# Patient Record
Sex: Female | Born: 1960 | Race: White | Hispanic: No | Marital: Single | Smoking: Former smoker
Health system: Southern US, Community
[De-identification: ages and names within clinical notes are randomized; demographics above are authoritative.]

## PROBLEM LIST (undated history)

## (undated) DIAGNOSIS — F1490 Cocaine use, unspecified, uncomplicated: Secondary | ICD-10-CM

## (undated) DIAGNOSIS — R51 Headache: Secondary | ICD-10-CM

## (undated) DIAGNOSIS — F32A Depression, unspecified: Secondary | ICD-10-CM

## (undated) DIAGNOSIS — J449 Chronic obstructive pulmonary disease, unspecified: Secondary | ICD-10-CM

## (undated) DIAGNOSIS — I2510 Atherosclerotic heart disease of native coronary artery without angina pectoris: Secondary | ICD-10-CM

## (undated) DIAGNOSIS — F329 Major depressive disorder, single episode, unspecified: Secondary | ICD-10-CM

## (undated) DIAGNOSIS — R519 Headache, unspecified: Secondary | ICD-10-CM

## (undated) DIAGNOSIS — I10 Essential (primary) hypertension: Secondary | ICD-10-CM

## (undated) DIAGNOSIS — I219 Acute myocardial infarction, unspecified: Secondary | ICD-10-CM

## (undated) DIAGNOSIS — I6529 Occlusion and stenosis of unspecified carotid artery: Secondary | ICD-10-CM

## (undated) DIAGNOSIS — I714 Abdominal aortic aneurysm, without rupture: Secondary | ICD-10-CM

## (undated) DIAGNOSIS — F419 Anxiety disorder, unspecified: Secondary | ICD-10-CM

## (undated) DIAGNOSIS — M1990 Unspecified osteoarthritis, unspecified site: Secondary | ICD-10-CM

## (undated) DIAGNOSIS — I639 Cerebral infarction, unspecified: Secondary | ICD-10-CM

## (undated) DIAGNOSIS — F319 Bipolar disorder, unspecified: Secondary | ICD-10-CM

## (undated) DIAGNOSIS — T148XXA Other injury of unspecified body region, initial encounter: Secondary | ICD-10-CM

## (undated) HISTORY — DX: Chronic obstructive pulmonary disease, unspecified: J44.9

## (undated) HISTORY — PX: TUBAL LIGATION: SHX77

## (undated) HISTORY — PX: CYST REMOVAL TRUNK: SHX6283

## (undated) HISTORY — PX: VASCULAR SURGERY: SHX849

## (undated) HISTORY — DX: Cerebral infarction, unspecified: I63.9

## (undated) HISTORY — DX: Abdominal aortic aneurysm, without rupture: I71.4

---

## 1999-07-28 ENCOUNTER — Emergency Department (HOSPITAL_COMMUNITY): Admission: EM | Admit: 1999-07-28 | Discharge: 1999-07-28 | Payer: Self-pay | Admitting: Emergency Medicine

## 2004-08-12 ENCOUNTER — Ambulatory Visit: Payer: Self-pay | Admitting: Cardiology

## 2005-07-15 ENCOUNTER — Ambulatory Visit: Payer: Self-pay | Admitting: Cardiology

## 2005-07-16 ENCOUNTER — Inpatient Hospital Stay (HOSPITAL_COMMUNITY): Admission: AD | Admit: 2005-07-16 | Discharge: 2005-07-17 | Payer: Self-pay | Admitting: Cardiology

## 2005-07-16 ENCOUNTER — Ambulatory Visit: Payer: Self-pay | Admitting: *Deleted

## 2005-07-20 ENCOUNTER — Ambulatory Visit: Payer: Self-pay | Admitting: Cardiology

## 2005-07-22 ENCOUNTER — Inpatient Hospital Stay (HOSPITAL_COMMUNITY): Admission: RE | Admit: 2005-07-22 | Discharge: 2005-07-23 | Payer: Self-pay | Admitting: *Deleted

## 2005-07-22 ENCOUNTER — Encounter (INDEPENDENT_AMBULATORY_CARE_PROVIDER_SITE_OTHER): Payer: Self-pay | Admitting: Specialist

## 2006-11-15 ENCOUNTER — Emergency Department (HOSPITAL_COMMUNITY): Admission: EM | Admit: 2006-11-15 | Discharge: 2006-11-15 | Payer: Self-pay | Admitting: Emergency Medicine

## 2007-10-18 ENCOUNTER — Ambulatory Visit: Payer: Self-pay | Admitting: Cardiology

## 2007-11-10 ENCOUNTER — Ambulatory Visit: Payer: Self-pay | Admitting: *Deleted

## 2008-06-15 ENCOUNTER — Ambulatory Visit: Payer: Self-pay | Admitting: Cardiology

## 2008-06-19 ENCOUNTER — Ambulatory Visit: Payer: Self-pay | Admitting: Cardiovascular Disease

## 2008-06-19 ENCOUNTER — Ambulatory Visit (HOSPITAL_COMMUNITY): Admission: RE | Admit: 2008-06-19 | Discharge: 2008-06-19 | Payer: Self-pay | Admitting: Cardiovascular Disease

## 2008-09-04 ENCOUNTER — Ambulatory Visit: Payer: Self-pay | Admitting: Cardiology

## 2010-02-17 ENCOUNTER — Ambulatory Visit: Payer: Self-pay | Admitting: Cardiology

## 2011-02-10 NOTE — Procedures (Signed)
CAROTID DUPLEX EXAM   INDICATION:  Followup of known carotid artery disease.  Patient is  asymptomatic.   HISTORY:  Diabetes:  No.  Cardiac:  MI x3 between 1996-2000.  Hypertension:  Yes.  Smoking:  Yes, one pack per day.  Previous Surgery:  Right CEA.  CV History:  Questionable right hemispheric TIA in 01/09.  Amaurosis Fugax No, Paresthesias No, Hemiparesis No                                       RIGHT             LEFT  Brachial systolic pressure:         152               160  Brachial Doppler waveforms:         WNL               WNL  Vertebral direction of flow:        Antegrade  DUPLEX VELOCITIES (cm/sec)  CCA peak systolic                   78  ECA peak systolic                   141  ICA peak systolic                   116 (M/D)  ICA end diastolic                   52  PLAQUE MORPHOLOGY:                  Intimal thickening  PLAQUE AMOUNT:                      Minimal  PLAQUE LOCATION:                    Bifurcation, ICA   IMPRESSION:  1. Right internal carotid artery without recurrent stenosis, status      post carotid endarterectomy.  2. Increased velocities, likely due to compensatory flow from left      internal carotid artery occlusion.  3. Known left internal carotid artery occlusion.   ___________________________________________  P. Liliane Bade, M.D.   PB/MEDQ  D:  11/10/2007  T:  11/11/2007  Job:  [id number]

## 2011-02-10 NOTE — Cardiovascular Report (Signed)
[name], [name]              ACCOUNT NO.:  [id number]   MEDICAL RECORD NO.:  [id number]          PATIENT TYPE:  OIB   LOCATION:  2899                         FACILITY:  [facility]   PHYSICIAN:  Veverly Fells. Excell Seltzer, MD  DATE OF BIRTH:  [birth year]   DATE OF PROCEDURE:  06/19/2008  DATE OF DISCHARGE:  06/19/2008                            CARDIAC CATHETERIZATION   PROCEDURES:  1. Left heart catheterization.  2. Selective coronary angiography.  3. Left ventricular angiography.  4. Intracoronary vascular ultrasound of the left mainstem and Angio-      Seal of the right femoral artery.   INDICATIONS:  [name] is a 50 year old with known coronary and  peripheral arterial disease.  She has a documented chronic right  coronary artery occlusion.  She has developed progressive chest pain.  She also has total occlusion of an internal carotid artery and is status  post carotid endarterectomy of the contralateral artery.  She has had  increasing chest pain in the setting of cocaine use, but also with  exertion.  She was referred for cardiac catheterization for evaluation  of her concerning symptoms.   Risks and indications of procedure were reviewed with the patient.  Informed consent was obtained.  The right groin was prepped, draped, and  anesthetized with 1% lidocaine using modified Seldinger technique.  A 5-  French sheath was placed in the right femoral artery.  A standard 5-  [place] Judkins catheters were used for coronary angiography and left  ventriculography.  There was dampening of the JL-4 catheter upon entry  into the left mainstem.  I changed out for a 4-French catheter and there  was still mild ventricularization of pressure damping.  There appeared  to be mild ostial left main stenosis angiographically.  I elected to  perform IVUS to the left mainstem to rule out significant obstructive  disease involving the ostium of the left main.  The 5-French sheath was  changed out  over a 0.035-inch wire for a 6-French sheath.  The 4500  units of unfractionated heparin was given.  An ACT of 220 was obtained  and an additional 1000 units of heparin was given.  A 6-French side-hole  JL-35 guide catheter was inserted and a cougar wire was passed easily  beyond the area of concern into the mid LAD.  The IVUS catheter was  positioned in the distal left mainstem and an automated pullback was  performed.  Intracoronary nitroglycerin was given prior to insertion of  the IVUS catheter.  The IVUS catheter was removed and final angiography  was performed demonstrating no change in the patient's coronary anatomy  or coronary flow.  An Angio-Seal device was used to close the femoral  arteriotomy.  The patient tolerated the procedure well.  There were no  immediate complications.   FINDINGS:  Aortic pressure 112/58 with a mean of 81.  Left ventricular  pressure 106/12.   Left mainstem.  There is a shelf-like 40% narrowing of the ostial left  mainstem with pressure dampening as described above.  The plaque appears  nonobstructive angiographically.  The remaining portions of the  left  mainstem have very mild nonobstructive disease.  The left main  bifurcates into the LAD and left circumflex.   The LAD is a large-caliber vessel that courses down to the LV apex.  The  proximal LAD has a 30% eccentric stenosis.  The mid and distal LAD have  mild stenosis with a 30% lesion in the distal vessel and another 30%  lesion in the midvessel.  The first diagonal branch is a moderate-sized  vessel with a 50% stenosis in the proximal aspect of the vessel.   Left circumflex.  The left circumflex has minor diffuse plaque.  There  is some mild haziness in the proximal vessel with 20% stenosis present.  It supplies a large OM branch that is widely patent.  The AV groove  circumflex is small, but widely patent.  There are collaterals supplied  to the distal right coronary artery from the  left.   Right coronary artery.  There is total occlusion of the proximal right  coronary artery with extensive bridging collaterals.  The vessel fills  via antegrade flow from bridging collaterals.  The flow is TIMI III.  There is diffuse disease throughout the remaining portions of the right  coronary artery with diffuse 50-70% stenosis in the midportion and 80%  stenosis distally.  There is a PDA branch as well as a posterolateral  branch both of which are patent and appears to be small vessels.   Intravascular ultrasound of the left mainstem demonstrated a shelf-like  nonobstructive eccentric plaque.  The minimal lumen area was 7.8 square  millimeters.   ASSESSMENT:  1. Chronic right coronary artery occlusion.  2. Mild left mainstem disease, confirmed by intravascular ultrasound.  3. Nonobstructive left anterior descending and left circumflex      stenosis.   PLAN:  Recommend medical therapy.  The patient does not have focal  disease that requires revascularization.      Veverly Fells. Excell Seltzer, MD  Electronically Signed     MDC/MEDQ  D:  06/19/2008  T:  06/20/2008  Job:  [id number]   cc:   Learta Codding, MD,FACC  Kirstie Peri, MD

## 2011-02-10 NOTE — Assessment & Plan Note (Signed)
[facility]                          [facility] OFFICE NOTE   NAME:[name], [name]                       MRN:          [id number]  DATE:06/15/2008                            DOB:          1960-12-05    REFERRING PHYSICIAN:  Kirstie Peri, MD   REFERRING PHYSICIAN:  Bimal R. Sherryll Burger, MD   HISTORY OF PRESENT ILLNESS:  The patient is a 50 year old female with  known severe peripheral vascular disease as well as severe single-vessel  coronary artery disease with chronically occluded right coronary artery.  The patient saw Dr. Sherryll [name] in the office and reported chest pain over the  last several weeks.  This was rather typical angina on exertion  associated with shortness of breath and radiation into the neck area.  Dr. Sherryll [name] performed a stress test in his office.  Unfortunately, the  patient also did 2 days prior to the stress test, cocaine.  Although we  do not have an official report per [name] reading, he thought that  there was an inferior defect and referred the patient to my clinic.  This defect is compatible with her prior finding of a completely  occluded right coronary artery.  However, in talking with the patient,  it is obvious that she does have symptoms of unstable angina and has  typical exertional symptoms.  The patient is on some minimal amount of  stress and been using the Xanax quite frequently.  She feels very  anxious and depressed.  She states that she has not been on cocaine in  the last 2 days, but does continue to smoke and occasionally uses  alcohol.   ALLERGIES:  PENICILLIN.   CURRENT MEDICATIONS:  1. Enteric-coated aspirin 325 mg p.o. daily.  2. Lopressor 100 mg p.o. daily.  3. Zoloft 100 mg p.o. daily.  4. Lotrel 10/20 mg p.o. daily.  5. Xanax p.r.n.  6. Mevacor 40 mg p.o. nightly.   PHYSICAL EXAMINATION:  VITAL SIGNS:  Blood pressure is 125/88, heart  rate 76, weight 182.  NECK:  Status post right carotid  endarterectomy with well-healed scar.  There is no bruit.  There is no thyromegaly, no nodular thyroid.  JVD 6  cm.  LUNGS:  Diminished breath sounds bilaterally with few rhonchi.  HEART:  Regular rate and rhythm with normal S1 and S2.  No murmur, rubs,  or gallops.  ABDOMEN:  Soft and nontender.  No rebound or guarding.  Good bowel  sounds.  EXTREMITIES:  No cyanosis, clubbing, or edema.  NEUROLOGIC:  The patient is alert, oriented, and grossly nonfocal.   PROBLEM LIST:  1. Recurrent substernal chest pain.  2. Abnormal Cardiolite stress study consistent with prior 100%      occlusion prior of the right coronary artery.  3. Severe single-vessel coronary artery disease.      a.     Status post remote myocardial infarction, treated medically       with catheterization three times at the [facility].      b.     Preserved ejection  fraction.  4. Severe cerebrovascular disease.      a.     Right carotid endarterectomy in October 2006, stable.      b.     A 100% occlusion of the left internal carotid artery.  5. Chronic obstructive pulmonary disease, longstanding tobacco use.  6. Cocaine use.  7. Hypertension.  8. Gastroesophageal reflux disease.  9. Anxiety and depression (severe).   PLAN:  1. The patient's symptoms are consistent with worsening angina.  She      has been given p.r.n. nitroglycerin.  Although her stress test is      abnormal, which is likely explained by her chronically occluded      right coronary artery, she certainly could have progressive disease      that is not identified by the stress test.  The patient has agreed      to proceed with cardiac catheterization.  I also told her that in      the event there is no additional disease, she will need further      treatment for her chronic chest pain syndrome, which may well be      related to her anxiety and depression.  2. I do not think the patient is on a particularly good regimen for       her depression.  Likely Zoloft needs to be changed to another      selective serotonin reuptake inhibitor or an serotonin-      norepinephrine reuptake inhibitor, and also her Xanax is not      optimal treatment in this setting in conjunction.  I will ask Dr.      Sherryll [name] to review this medical regimen, particularly if the patient      does not have a clear explanation by cardiac catheterization for      her chest pain.  3. The patient has promised me that she will not use any cocaine, and      therefore I have not made any change in her medical regimen and I      left her on Lopressor.  I pointed out to her the issues with      cocaine and beta-blockers.  4. The patient will have the J-tube catheterization next week.     [name] Codding, MD,FACC  Electronically Signed    GED/MedQ  DD: 06/15/2008  DT: 06/16/2008  Job #: [id number]   cc:   [name] Sidle, MD  [name] Peri, MD

## 2011-02-13 NOTE — Op Note (Signed)
[name]              ACCOUNT NO.:  [id number]   MEDICAL RECORD NO.:  [id number]          PATIENT TYPE:  INP   LOCATION:  3306                         FACILITY:  [facility]   PHYSICIAN:  Balinda Quails, M.D.    DATE OF BIRTH:  1961-08-29   DATE OF PROCEDURE:  07/22/2005  DATE OF DISCHARGE:                                 OPERATIVE REPORT   SURGEON:  Denman George, MD   ASSISTANT:  Zadie Rhine, PA   ANESTHETIC:  General endotracheal.   PREOPERATIVE DIAGNOSIS:  Severe bilateral internal carotid artery stenoses.   POSTOPERATIVE DIAGNOSIS:  Severe bilateral internal carotid artery stenoses.   PROCEDURE:  Right carotid endarterectomy with Dacron patch angioplasty.   CLINICAL NOTE:  [name] is a 50 year old female was seen in  consultation last week by Dr. Edilia Bo with evidence of severe bilateral  internal carotid artery stenosis by Doppler evaluation.  Complete  consultation was carried out and it was recommended she undergo right  carotid endarterectomy for reduction of stroke risk.  Patient consented for  surgery.  Surgery scheduled for today electively on readmission to the  hospital.  Risks and benefits of the operative procedure were explained to  the patient in detail with a major morbidity and mortality of 1% to 2% to  include, but not limited to MI, CVA, cranial nerve injury and death.   OPERATIVE PROCEDURE:  The patient was brought to the operating room in  stable condition.  Placed in supine position.  General endotracheal  anesthesia induced.  Right neck prepped and draped in sterile fashion.  Foley catheter and arterial line in place.   Curvilinear skin incision made along the anterior border of the right  sternomastoid muscle.  Dissection carried down through subcutaneous tissue  with electrocautery.  Deep dissection carried through the platysma.  Further  dissection carried down to expose the carotid vessels.  Facial vein ligated  with 2-0  silk.  Carotid bifurcation exposed.  The common carotid artery  mobilized down to the omohyoid muscle and encircled with a Vesseloop.  The  superior thyroid and external carotid were freed and encircled with  Vesseloops.  The internal carotid artery was followed distally up to the  posterior belly of the digastric muscle and circled with a Vesseloop.   There was extensive plaque in the common carotid artery extending into the  bulb and proximal right internal carotid artery.  The patient was  administered 7000 units of heparin intravenously.  Adequate circulation time  permitted.  Carotid vessels were controlled with clamps.  A longitudinal  arteriotomy was made in the distal common carotid artery.  The arteriotomy  extended across carotid bulb and up into the internal carotid artery.  There  was a large amount of plaque in the common carotid artery.  The arteriotomy  had to be extended proximally well down into the common carotid artery.  The  shunt was then inserted.   An endarterectomy elevator was used to remove the plaque.  Extensive  endarterectomy of the common carotid artery was carried down into the base  of the neck,  where the plaque was divided transversely with Potts scissors.  Plaque then raised up into the bulb, where the superior thyroid and external  carotid were endarterectomized using an eversion technique.  The internal  carotid artery plaque then feathered out distally.  Fragments plaque were  removed with fine forceps.  The site irrigated with heparin and saline  solution.   A primary arteriotomy closure of the common carotid artery was carried out  using running 6-0 Prolene suture.  This was carried up into the proximal  bulb.  A patch angioplasty the bulb and internal carotid artery was then  carried out with a finesse Dacron patch using running 6-0 Prolene suture.  At completion of this, the shunt was removed.  All vessels were well-  flushed.  Clamps were  removed, directing the initial antegrade flow up the  external carotid artery; following this, the internal carotid was released.  There was an excellent pulse and Doppler signal in the distal internal  carotid artery.   Adequate hemostasis was obtained.  Sponges and instrument counts were  correct.  The patient was administered 50 mg of protamine intravenously.   The sternomastoid fascia was closed with running 2-0 Vicryl suture.  Platysma was closed with running 3-0 Vicryl suture.  Skin was closed with 4-  0 Monocryl.  Steri-Strips applied.   A sterile dressing was applied.  Anesthesia reversed.  The patient awakened  readily, neurologically intact.  Transferred to the recovery room in stable  condition.      Balinda Quails, M.D.  Electronically Signed     PGH/MEDQ  D:  07/22/2005  T:  07/23/2005  Job:  [id number]   cc:   Lia Hopping  Fax: [phone]   Jonita Albee [facility]

## 2011-02-13 NOTE — Discharge Summary (Signed)
NAMEABIR, EROH              ACCOUNT NO.:  [id number]   MEDICAL RECORD NO.:  [id number]          PATIENT TYPE:  INP   LOCATION:  3306                         FACILITY:  [facility]   PHYSICIAN:  Balinda Quails, M.D.    DATE OF BIRTH:  Oct 08, 1960   DATE OF ADMISSION:  07/22/2005  DATE OF DISCHARGE:  07/23/2005                                 DISCHARGE SUMMARY   PRIMARY DIAGNOSIS:  Severe bilateral internal carotid artery stenosis.   SECONDARY DIAGNOSES:  1.  Hypertension.  2.  History of depression.   IN-HOSPITAL OPERATIONS AND PROCEDURES:  Right carotid endarterectomy with  Dacron patch angioplasty.   HISTORY AND PHYSICAL/HOSPITAL COURSE:  The patient is a 50 year old female  who was seen in consultation by Dr. Edilia Bo with evidence of severe  bilateral internal carotid artery stenosis by Doppler evaluation. Complete  consultation was carried out and it was recommended that she was undergo  right internal carotid endarterectomy for __________. The patient was then  seen by Dr. Madilyn Fireman. Dr. Madilyn Fireman discussed with the patient surgery. They  discussed the risks and benefits of the procedure. The patient acknowledged  understanding and wished to proceed. Surgery was scheduled for July 22, 2005.   The patient was taken to the operating room on July 22, 2005, where she  underwent right carotid endarterectomy with Dacron patch angioplasty. The  patient tolerated the procedure well and was transferred up to the intensive  care unit in stable condition. The patient's postoperative course was pretty  much unremarkable. She remained hemodynamically stable during her  postoperative course. She was out of bed ambulating well postoperative day  one. The patient neurologically remained intact. Incisions were dry, intact,  and healing well. She was weaned off oxygen saturating greater than 90% on  room air prior to DC. She remained afebrile. Blood pressure was well  controlled. The patient  was tolerating diet well with no nausea or vomiting  noted. Foley was discontinued on postoperative day one. The patient was  voiding without difficulties prior to DC home.   The patient was discharged to home on postoperative day one, July 23, 2005, in stable condition. A follow-up appointment was scheduled for Dr.  Madilyn Fireman for August 17, 2005, at 10:40 a.m. The patient was instructed on  diet, activity level, incisional care. She was told no driving until  released to do so. No heavy lifting over 10 pounds. The patient is told she  is allowed to shower, washing her incisions using soap and water. She is to  contact the office she develops any drainage or openings from any of her  incisions site. The patient is told to ambulate three to four times a day,  progress as tolerated, and to continue her breathing exercises. She was  educated on diet to be low fat, low salt. The patient was also reminded to  quit smoking. She acknowledged her understanding.   DISCHARGE MEDICATIONS:  1.  Tylox one to two tabs p.o. q.4-6h. p.r.n. pain.  2.  Lopressor 100 mg p.o. daily.  3.  Lotrel 20 mg p.o. daily.  4.  Zoloft 100 mg p.o. daily.  5.  Xanax 0.5 mg p.o. b.i.d. p.r.n.  6.  Nitroglycerin 0.4 mg p.r.n.      Stephanie Acre Dominick, PA      P. Liliane Bade, M.D.  Electronically Signed    KMD/MEDQ  D:  09/25/2005  T:  09/26/2005  Job:  [id number]

## 2011-02-13 NOTE — H&P (Signed)
[name], KRUCZEK            ACCOUNT NO.:  [id number]   MEDICAL RECORD NO.:  [id number]          PATIENT TYPE:  INP   LOCATION:  4743                         FACILITY:  [facility]   PHYSICIAN:  Di Kindle. Edilia Bo, M.D.DATE OF BIRTH:  Jan 30, 1961   DATE OF ADMISSION:  07/16/2005  DATE OF DISCHARGE:                                HISTORY & PHYSICAL   REASON FOR CONSULTATION:  Bilateral greater than 80% carotid stenoses.   HISTORY:  This is a pleasant 50 year old woman who was admitted, yesterday,  for cardiac catheterization. She does have a history of coronary artery  disease and has had previous multiple myocardial infarctions, most recently,  at age 62. She presents with a 2-week history of substernal chest pain which  is relieved with nitroglycerin. She had been at [facility]  diagnosed with bilateral greater than 80% carotid stenoses. She did have  negative cardiac enzymes and she is to undergo this later today.   PAST MEDICAL HISTORY:  1.  Her past medical history is significant for coronary artery disease      status post MI.  2.  Hypertension.  3.  History of dyslipidemia.  4.  She denies any history of diabetes, congestive heart failure.   PAST SURGICAL HISTORY:  Significant for previous tubal ligation.   SOCIAL HISTORY:  She is single, and has four children. She smokes a half-a-  pack to 1 pack per day of cigarettes and has been smoking since she was 13.  She denies any alcohol.   FAMILY HISTORY:  Her father had coronary disease at a young age as did her  mother although she cannot remember the exact ages.   REVIEW OF SYSTEMS:  GENERAL:  She has had no weight loss, weight gain,  fever, chills.  CARDIAC:  She has had substernal chest pain and admits to some dyspnea on  exertion. She denies any palpitations.  PULMONARY:  She denies bronchitis, asthma or wheezing.  GI:  She has had no recent change of bowel habits; and has no history of  peptic  ulcer disease.  GU:  She has had no dysuria or frequency.  VASCULAR:  She denies any claudication, rest pain or nonhealing ulcers.  NEUROLOGIC:  She denies any history of seizures, headaches.   PHYSICAL EXAMINATION:  VITAL SIGNS:  Blood pressure is 120/68;  heart rate  is 72.  HEENT:  There is no cervical lymphadenopathy. She does have bilateral  carotid bruits.  LUNGS:  Clear bilaterally to auscultation.  CARDIAC EXAM:  She has a regular rate and rhythm.  ABDOMEN:  Soft and nontender. I cannot palpate an aneurysm.  EXTREMITIES:  She has palpable femoral, popliteal, dorsalis pedis, and  posterior tibial pulses bilaterally. She has mild, bilateral, lower  extremity swelling.  NEUROLOGIC EXAM:  Shows good motor, sensory deficit.   I did review the carotid duplex scan from [facility] and she  appears to have very tight bilateral ICA stenosis. Most of these are quite  high on both sides, 185 cm/sec on the right; and 233 cm/sec on the left. If  she does in fact,  have tight bilateral carotid stenoses, although she  appears to be asymptomatic; I would recommend staged bilateral carotid  endarterectomies. I will obtain a carotid duplex,  here, as I had seen the  ICAs and ECAs confused at outlying labs in the past. Assuming the artery  results confirm the [facility] results; however, I will need to consider the  timing of surgery. Given that she has had symptomatic coronary artery  disease is to undergo CABG, we would have to consider a combined procedure.  I will made further recommendations pending results of her followup duplex  and her cardiac cath.      Di Kindle. Edilia Bo, M.D.  Electronically Signed     CSD/MEDQ  D:  07/16/2005  T:  07/16/2005  Job:  [id number]

## 2011-02-13 NOTE — Discharge Summary (Signed)
[name], [name]            ACCOUNT NO.:  [id number]   MEDICAL RECORD NO.:  [id number]          PATIENT TYPE:  INP   LOCATION:  4743                         FACILITY:  [facility]   PHYSICIAN:  Learta Codding, M.D. LHCDATE OF BIRTH:  [birth year]   DATE OF ADMISSION:  07/16/2005  DATE OF DISCHARGE:  07/17/2005                                 DISCHARGE SUMMARY   CARDIOLOGIST:  Learta Codding, M.D. [facility].   CVTS:  Di Kindle. Edilia Bo, M.D.   PRIMARY CARE PHYSICIAN:  Dr. Sherryll Burger in [place], [place].   DISCHARGING DIAGNOSES:  1.  Substernal chest pain, resolved. Status post cardiac catheterization.      Occluded right coronary artery, left-to-right collateral. Ejection      fraction of 65%.  Patent bilateral subclavian.  2.  Bilateral carotid artery disease.  3.  Dyslipidemia.  4.  Tobacco abuse.  5.  Medical noncompliance.   PAST MEDICAL HISTORY:  Includes coronary artery disease, status post  myocardial infarction, hypertension, dyslipidemia, tubal ligation surgery.   HOSPITAL COURSE:  [name] is a 50 year old Caucasian female admitted to  [facility] with complaints of chest pain.  [name] has  known history of coronary artery disease, has had previous multiple  myocardial infarctions most recently at age 63.  The patient initially seen  by Arnette Felts and Dr. Myrtis Ser at [facility].  Apparently Ms.  [name] saw Dr. Diona Browner back in 2004 for a nuclear study showing an EF of  51%.  The patient presented to [facility] on this admission  with 2 weeks of substernal chest pain relieved with nitroglycerin.  Apparently [name] has had a history of 80-99% right internal carotid  stenosis and needed a CVTS consult; however, did not show up in 2004 and  2005; appointments for further evaluation. At [facility] on  July 15, 2005 patient had a bilateral carotid duplex showing large amount  of mixed density plaque  bilateral with circle lesion on right and elongated  plaque on left both consistent with 80-99% stenosis.  The patient  transferred from [facility] to [facility] for  further evaluation an CVTS consult for carotid endarterectomy.  The patient  was admitted to the catheterization lab on July 16, 2005 for unstable  angina.  The patient tolerated the procedure without complications, results  as stated above, coronary artery disease, medical management.  CVTS  consulted; Dr. Edilia Bo in to see patient on 07/17/2005.  The patient to be  arranged for outpatient right carotid endarterectomy next Wednesday then  probably proceed with left carotid endarterectomy in 4-6 weeks with the  patient being discharged home with plans to bring her back next week for a  right carotid endarterectomy.   LAB WORK:  Prior to discharge this admission, white blood cell count 9.2,  hemoglobin 11.3, hematocrit 33, platelets 432,000.  Sodium 137, potassium  4.1, chloride 107, CO2 25, glucose 99, BUN 13, with creatinine of 0.6.  Hemoglobin A1c 6.1, total cholesterol 221, triglycerides 156, HDL 41 with an  LDL of 149.  The patient's hepatic function within  normal limits except for  albumin of 3.3.  Cardiac enzymes at [facility] were negative with a troponin  of 0.03, and 0.01.  On discharge patient afebrile, blood pressure 109/67.  EKG normal sinus rhythm.   MEDICATIONS AT DISCHARGE:  1.  Aspirin 325 mg daily.  2.  Lopressor 100 mg daily.  3.  Zoloft 100 mg daily.  4.  Lotrel 10/20 mg daily.  5.  Xanax 0.5 mg p.o. b.i.d. and Mevacor 40 mg at bedtime.  6.  The patient requesting prescription for hydrocodone.  I instructed her      to followup with the doctor that prescribed this for her migraines.  7.  Also have given her a prescription for nitroglycerin p.r.n. for chest      discomfort.   She has had smoking cessation education.  She is to followup at our [facility]  office on Monday for a post  catheterization check at 12:30 and then followup  with CVTS, Dr. Edilia Bo at [phone].  CVTS will call patient for instructions  on return to hospital for carotid endarterectomy.  She is to call our office  at [phone] for any problems for her catheterization site prior to Monday.  I have also given her instructions for post catheterization.  No driving for  2 days.  Not to lift over 10 pounds x1 weeks.  She may shower, may walk up  steps, increase activity slowly.  No sexual activity for 1 week.  No tub  bathing x2 days.  She is to follow a low fat low salt, low cholesertol diet.      Dorian Pod, NP      Learta Codding, M.D. [facility]  Electronically Signed    MB/MEDQ  D:  07/17/2005  T:  07/18/2005  Job:  [id number]   cc:   Learta Codding, M.D. [facility]  [address]  [address]  [place]  [place] [address]   Di Kindle. Edilia Bo, M.D.  [address]  [place]  [place] [address]   Kirstie Peri, MD  Fax: [phone]

## 2011-02-13 NOTE — Cardiovascular Report (Signed)
[name], [name] NO.:  [id number]   MEDICAL RECORD NO.:  [id number]          PATIENT TYPE:  INP   LOCATION:  4743                         FACILITY:  [facility]   PHYSICIAN:  Vida Roller, M.D.   DATE OF BIRTH:  12-24-1960   DATE OF PROCEDURE:  07/16/2005  DATE OF DISCHARGE:                              CARDIAC CATHETERIZATION   PRIMARY CARE PHYSICIAN:  Dr. Clelia Croft in [place], [place].   CARDIOLOGIST:  Dr. Andee Lineman and Dr. Diona Browner.   HISTORY OF PRESENT ILLNESS:  [name] is a 50 year old woman, with  multiple cardiac risk factors including peripheral vascular disease, ongoing  tobacco abuse, who presents with the discomfort in her chest which is  concerning for coronary disease. She has known severe carotid disease on  carotid Dopplers and there was concern for bilateral subclavian artery  stenosis as well and she is referred for left heart catheterization and  subclavian angiography.   PROCEDURE PERFORMED:  1.  Left heart catheterization.  2.  Selective coronary angiography.  3.  Left ventriculography.  4.  Bilateral subclavian angiography.   PROCEDURE IN DETAIL:  After obtaining informed consent, the patient was  brought to the cardiac catheterization laboratory in fasting state. There  she was prepped and draped in the usual sterile manner. Local anesthetic was  obtained over the right groin using 1% lidocaine without epinephrine. The  right femoral artery was cannulated using the modified Seldinger technique  with a 6-French 10 cm sheath and left heart catheterization was performed  using a 6-French Judkins left #4, a 6-French Judkins right #4 and a 6-French  pigtail catheter. The left Judkins catheter was used for left main coronary  artery. The right Judkins catheter was used for the right coronary artery  and both subclavians pigtail catheter was used for left ventriculography in  the 30 degree RAO view. At the conclusion of the procedure, the  catheters  were removed. The patient was moved back to the cardiology holding area. The  femoral artery sheath was removed. Hemostasis was obtained using direct  manual pressure. At the conclusion of the hold, there was no evidence of  ecchymosis or hematoma formation. Distal pulses were intact. Total  fluoroscopic time was 6.8 minutes. Total ionized contrast 140 cc.   RESULTS:  Aortic pressure 160/90, mean of 122 mmHg.   Left ventricular pressure 167/16, end-diastolic pressure of 31 mmHg.   SELECTIVE CORONARY ANGIOGRAPHY:  The left main coronary artery is a moderate  caliber vessel which angiographically unremarkable.   Left anterior descending coronary artery is a large transapical vessel with  very tortuous course which has no significant obstructive disease but  luminal irregularities.   Left circumflex coronary artery is a moderate caliber vessel which has a  large obtuse marginal branch. It is without significant stenosis.   The right coronary artery is a moderate caliber dominant vessel which is  occluded in its proximal portion. There is bridging right-to-right  collaterals and left-to-right collaterals. The distal vessel was underfilled  but appears to have a 75% stenosis in the midportion. There is a posterior  descending and a posterior  lateral branch which are not well seen.   Left ventriculogram reveals preserved LV systolic function, estimated  ejection fraction 65%. No obvious wall motion abnormalities. 1+ mitral  regurgitation. Frequent ventricular ectopy.   ASSESSMENT:  1.  Single-vessel coronary disease appears to be chronically occluded and      probably not amenable to angioplasty  2.  Patent bilateral subclavian arteries.  3.  Normal left ventricular systolic function with an estimated ejection      fraction of 65%.   PLAN:  Manage her coronary disease medically and the CVTS surgeons have seen  her already in initial consult and are considering further  evaluation of her  carotid disease.      Vida Roller, M.D.  Electronically Signed     JH/MEDQ  D:  07/16/2005  T:  07/17/2005  Job:  [id number]   cc:   Clelia Croft, M.D.  [place], [place]

## 2011-05-22 DIAGNOSIS — R079 Chest pain, unspecified: Secondary | ICD-10-CM

## 2011-07-18 ENCOUNTER — Emergency Department (HOSPITAL_COMMUNITY)
Admission: EM | Admit: 2011-07-18 | Discharge: 2011-07-18 | Disposition: A | Discharge status: Home / Self Care | Payer: Self-pay | Attending: Emergency Medicine | Admitting: Emergency Medicine

## 2011-07-18 ENCOUNTER — Emergency Department (HOSPITAL_COMMUNITY): Payer: Self-pay

## 2011-07-18 ENCOUNTER — Encounter: Payer: Self-pay | Admitting: *Deleted

## 2011-07-18 DIAGNOSIS — W19XXXA Unspecified fall, initial encounter: Secondary | ICD-10-CM

## 2011-07-18 DIAGNOSIS — I10 Essential (primary) hypertension: Secondary | ICD-10-CM | POA: Insufficient documentation

## 2011-07-18 DIAGNOSIS — F17200 Nicotine dependence, unspecified, uncomplicated: Secondary | ICD-10-CM | POA: Insufficient documentation

## 2011-07-18 DIAGNOSIS — W108XXA Fall (on) (from) other stairs and steps, initial encounter: Secondary | ICD-10-CM | POA: Insufficient documentation

## 2011-07-18 DIAGNOSIS — S9030XA Contusion of unspecified foot, initial encounter: Secondary | ICD-10-CM | POA: Insufficient documentation

## 2011-07-18 HISTORY — DX: Anxiety disorder, unspecified: F41.9

## 2011-07-18 HISTORY — DX: Essential (primary) hypertension: I10

## 2011-07-18 HISTORY — DX: Bipolar disorder, unspecified: F31.9

## 2011-07-18 HISTORY — DX: Major depressive disorder, single episode, unspecified: F32.9

## 2011-07-18 HISTORY — DX: Depression, unspecified: F32.A

## 2011-07-18 MED ORDER — IBUPROFEN 400 MG PO TABS
600.0000 mg | ORAL_TABLET | Freq: Once | ORAL | Status: AC
  Administered 2011-07-18: 600 mg via ORAL

## 2011-07-18 MED ORDER — IBUPROFEN 400 MG PO TABS
ORAL_TABLET | ORAL | Status: AC
  Filled 2011-07-18: qty 2

## 2011-07-18 MED ORDER — HYDROCODONE-ACETAMINOPHEN 5-325 MG PO TABS
1.0000 | ORAL_TABLET | Freq: Once | ORAL | Status: AC
  Administered 2011-07-18: 1 via ORAL
  Filled 2011-07-18: qty 1

## 2011-07-18 MED ORDER — HYDROCODONE-ACETAMINOPHEN 5-325 MG PO TABS: 1.0000 | ORAL_TABLET | ORAL | Status: AC | PRN

## 2011-07-18 NOTE — ED Notes (Signed)
Pts left knee gave out causing her to fall down the stairs. Pt now c/o right foot pain.

## 2011-07-18 NOTE — ED Notes (Signed)
Pt left the er stating no needs 

## 2011-07-18 NOTE — ED Provider Notes (Signed)
History     CSN: [id number] Arrival date & time: 07/18/2011  3:14 AM   First MD Initiated Contact with Patient 07/18/11 0423      Chief Complaint  Patient presents with  . Foot Pain    (Consider location/radiation/quality/duration/timing/severity/associated sxs/prior treatment) HPI Comments: Seen 0423  Patient is a 50 y.o. female presenting with lower extremity pain. The history is provided by the patient.  Foot Pain This is a new (fell down stairs with foot under her.) problem. The current episode started 3 to 5 hours ago. The problem has not changed since onset.Pertinent negatives include no chest pain, no abdominal pain and no shortness of breath. The symptoms are aggravated by walking (movement, standing, palpation). The symptoms are relieved by nothing. She has tried nothing for the symptoms.    Past Medical History  Diagnosis Date  . Hypertension   . Depression   . Anxiety   . Bipolar 1 disorder     Past Surgical History  Procedure Date  . Tubal ligation   . Artery cleaned     No family history on file.  History  Substance Use Topics  . Smoking status: Current Everyday Smoker  . Smokeless tobacco: Not on file  . Alcohol Use: No    OB History    Grav Para Term Preterm Abortions TAB SAB Ect Mult Living                  Review of Systems  Respiratory: Negative for shortness of breath.   Cardiovascular: Negative for chest pain.  Gastrointestinal: Negative for abdominal pain.  Musculoskeletal:       Right foot pain  All other systems reviewed and are negative.    Allergies  Penicillins  Home Medications     BP 149/76  Pulse 82  Temp(Src) 97.9 F (36.6 C) (Oral)  Resp 20  Ht 5\' 3"  (1.6 m)  Wt 200 lb (90.719 kg)  BMI 35.43 kg/m2  SpO2 100%  LMP 07/18/2011  Physical Exam  Nursing note and vitals reviewed. Constitutional: She is oriented to person, place, and time. She appears well-developed and well-nourished.  HENT:  Head:  Normocephalic and atraumatic.  Eyes: EOM are normal.  Neck: Normal range of motion. Neck supple.  Cardiovascular: Normal rate, normal heart sounds and intact distal pulses.   Pulmonary/Chest: Effort normal.       Rales in both bases  Abdominal: Soft.  Musculoskeletal:       Crepitus to both knees. Right foot with bruising and swelling to dorsum of the foot. Pain with movement of the foot or dorsiflexion of toes. Cap refill brisk. DP and PT 2+. Painful to palpation over dorsum of foot.  Neurological: She is alert and oriented to person, place, and time.  Skin: Skin is warm and dry.    ED Course  Procedures (including critical care time)  Labs Reviewed - No data to display Dg Foot Complete Right  07/18/2011  *RADIOLOGY REPORT*  Clinical Data: Status post fall down stairs; dorsal foot pain across the base of the foot, and unable to bear weight.  RIGHT FOOT COMPLETE - 3+ VIEW  Comparison: None.  Findings: There is no evidence of fracture or dislocation.  The joint spaces are preserved.  There is no evidence of talar subluxation; the subtalar joint is unremarkable in appearance.  A small plantar calcaneal spur is incidentally noted.  No significant soft tissue abnormalities are seen.  IMPRESSION: No evidence of fracture or dislocation.  Original Report  Authenticated By: Tonia Ghent, M.D.        MDM  Patient who fell with her right foot under her. Now with bruising and swelling to dorsum of foot. Xrays with no acute findings. Symptomatic treatment initiated, analgesic, antiinflammatory, post op shoe, crutches.Pt stable in ED with no significant deterioration in condition.The patient appears reasonably screened and/or stabilized for discharge and I doubt any other medical condition or other [facility] requiring further screening, evaluation, or treatment in the ED at this time prior to discharge. MDM Reviewed: nursing note and vitals Interpretation: x-ray           Nicoletta Dress. Colon Branch,  MD 07/18/11 [phone]

## 2011-07-18 NOTE — ED Notes (Signed)
Education on crutch use completed pt return demo correct use

## 2013-10-20 ENCOUNTER — Encounter (HOSPITAL_COMMUNITY): Payer: Self-pay | Admitting: Emergency Medicine

## 2013-10-20 DIAGNOSIS — Z880 Allergy status to penicillin: Secondary | ICD-10-CM | POA: Insufficient documentation

## 2013-10-20 DIAGNOSIS — I10 Essential (primary) hypertension: Secondary | ICD-10-CM | POA: Insufficient documentation

## 2013-10-20 DIAGNOSIS — F17200 Nicotine dependence, unspecified, uncomplicated: Secondary | ICD-10-CM | POA: Insufficient documentation

## 2013-10-20 DIAGNOSIS — H538 Other visual disturbances: Secondary | ICD-10-CM | POA: Insufficient documentation

## 2013-10-20 DIAGNOSIS — F411 Generalized anxiety disorder: Secondary | ICD-10-CM | POA: Insufficient documentation

## 2013-10-20 DIAGNOSIS — J329 Chronic sinusitis, unspecified: Secondary | ICD-10-CM | POA: Insufficient documentation

## 2013-10-20 DIAGNOSIS — R0789 Other chest pain: Secondary | ICD-10-CM | POA: Insufficient documentation

## 2013-10-20 NOTE — ED Notes (Signed)
Pt. reports intermittent mid chest pain with left eye blurred vision redness and drainage onset this evening , no SOB or nausea.

## 2013-10-21 ENCOUNTER — Encounter (HOSPITAL_COMMUNITY): Payer: Self-pay | Admitting: *Deleted

## 2013-10-21 ENCOUNTER — Emergency Department (HOSPITAL_COMMUNITY): Payer: Self-pay

## 2013-10-21 ENCOUNTER — Emergency Department (HOSPITAL_COMMUNITY)
Admission: EM | Admit: 2013-10-21 | Discharge: 2013-10-21 | Disposition: A | Discharge status: Home / Self Care | Payer: Self-pay | Attending: Emergency Medicine | Admitting: Emergency Medicine

## 2013-10-21 ENCOUNTER — Other Ambulatory Visit: Payer: Self-pay

## 2013-10-21 DIAGNOSIS — F419 Anxiety disorder, unspecified: Secondary | ICD-10-CM

## 2013-10-21 DIAGNOSIS — J329 Chronic sinusitis, unspecified: Secondary | ICD-10-CM

## 2013-10-21 DIAGNOSIS — R079 Chest pain, unspecified: Secondary | ICD-10-CM

## 2013-10-21 LAB — BASIC METABOLIC PANEL
BUN: 24 mg/dL — ABNORMAL HIGH (ref 6–23)
CO2: 23 mEq/L (ref 19–32)
Calcium: 8.9 mg/dL (ref 8.4–10.5)
Chloride: 106 mEq/L (ref 96–112)
Creatinine, Ser: 0.92 mg/dL (ref 0.50–1.10)
GFR calc Af Amer: 81 mL/min — ABNORMAL LOW (ref >90)
GFR calc non Af Amer: 70 mL/min — ABNORMAL LOW (ref >90)
Glucose, Bld: 104 mg/dL — ABNORMAL HIGH (ref 70–99)
Potassium: 4.3 mEq/L (ref 3.7–5.3)
Sodium: 144 mEq/L (ref 137–147)

## 2013-10-21 LAB — CBC
HCT: 39.3 % (ref 36.0–46.0)
Hemoglobin: 12.9 g/dL (ref 12.0–15.0)
MCH: 29.1 pg (ref 26.0–34.0)
MCHC: 32.8 g/dL (ref 30.0–36.0)
MCV: 88.7 fL (ref 78.0–100.0)
Platelets: 294 10*3/uL (ref 150–400)
RBC: 4.43 MIL/uL (ref 3.87–5.11)
RDW: 14.1 % (ref 11.5–15.5)
WBC: 8.3 10*3/uL (ref 4.0–10.5)

## 2013-10-21 LAB — PRO B NATRIURETIC PEPTIDE: Pro B Natriuretic peptide (BNP): 657.3 pg/mL — ABNORMAL HIGH (ref 0–125)

## 2013-10-21 LAB — POCT I-STAT TROPONIN I: TROPONIN I, POC: 0.03 ng/mL (ref 0.00–0.08)

## 2013-10-21 MED ORDER — ALPRAZOLAM 0.5 MG PO TABS: 0.5000 mg | ORAL_TABLET | Freq: Every evening | ORAL | Status: DC | PRN

## 2013-10-21 MED ORDER — AZITHROMYCIN 250 MG PO TABS: ORAL_TABLET | ORAL | Status: DC

## 2013-10-21 MED ORDER — NITROGLYCERIN 0.4 MG SL SUBL
0.4000 mg | SUBLINGUAL_TABLET | SUBLINGUAL | Status: DC | PRN
  Administered 2013-10-21: 0.4 mg via SUBLINGUAL
  Filled 2013-10-21: qty 25

## 2013-10-21 NOTE — ED Notes (Signed)
CXR at bedside

## 2013-10-21 NOTE — ED Provider Notes (Signed)
CSN: [id number]     Arrival date & time 10/20/13  2352 History   First MD Initiated Contact with Patient 10/21/13 0033     Chief Complaint  Patient presents with  . Chest Pain   (Consider location/radiation/quality/duration/timing/severity/associated sxs/prior Treatment) HPI Patient is a 53 yo woman who states that she has has had 3 previous MIs. She has a history of PVD - s/p right CEA. She also says she has a history of of stroke.   She presents with complaints of chest pain which began around 8pm. Pain has been persistent although it has lessened somewhat. It is right sided, 6/10, pressure like and nonradiating. Pain feels similar to previous MI's although location is different. Previously, angina has been centrally located.   The patient denies any SOB, diaphoresis, light headedness.  Pain is worse with exertion - walking. Patient notes pain has also been intermittent.   She is not under the care of a cardiologist nor is she taking prescribed antihypertensives because she is uninsured. She smokes 1 to 2 ppd.   The patient notes that, shortly after her CP started, she began to experience blurred vision in left eye. This has persisted. NO ocular pain. Denies amaurosis - which is the sx she has had with several previous TIAs.   Past Medical History  Diagnosis Date  . Hypertension   . Depression   . Anxiety   . Bipolar 1 disorder    Past Surgical History  Procedure Laterality Date  . Tubal ligation    . Artery cleaned     No family history on file. History  Substance Use Topics  . Smoking status: Current Every Day Smoker  . Smokeless tobacco: Not on file  . Alcohol Use: No   OB History   Grav Para Term Preterm Abortions TAB SAB Ect Mult Living                 Review of Systems Ten point review of symptoms performed and is negative with the exception of symptoms noted above.   Allergies  Penicillins  Home Medications    BP 197/119  Pulse 94  Temp(Src) 97.9 F  (36.6 C) (Oral)  Resp 18  SpO2 99% Physical Exam Gen: well developed and well nourished appearing Head: NCAT Eyes: PERL, EOMI Nose: no epistaixis or rhinorrhea Mouth/throat: mucosa is moist and pink Neck: supple, no stridor Lungs: CTA B, no wheezing, rhonchi or rales CV: RRR, no murmur, extremities appear well perfused.  Abd: soft, notender, nondistended Back: no ttp, no cva ttp Skin: warm and dry Ext: normal to inspection, no dependent edema Neuro: CN ii-xii grossly intact, no focal deficits, normal speech, normal gait, motor strength 5/5 both arms and legs, normal finger to nose.  Psyche; normal affect,  calm and cooperative.   ED Course  Procedures (including critical care time) Labs Review EKG: nsr, no acute ischemic changes, normal intervals, normal axis, normal qrs complexImaging   Results for orders placed during the hospital encounter of 10/21/13 (from the past 24 hour(s))  CBC     Status: None   Collection Time    10/21/13  1:20 AM      Result Value Range   WBC 8.3  4.0 - 10.5 K/uL   RBC 4.43  3.87 - 5.11 MIL/uL   Hemoglobin 12.9  12.0 - 15.0 g/dL   HCT 40.939.3  81.136.0 - 91.446.0 %   MCV 88.7  78.0 - 100.0 fL   MCH 29.1  26.0 - 34.0  pg   MCHC 32.8  30.0 - 36.0 g/dL   RDW 16.1  09.6 - 04.5 %   Platelets 294  150 - 400 K/uL  BASIC METABOLIC PANEL     Status: Abnormal   Collection Time    10/21/13  1:20 AM      Result Value Range   Sodium 144  137 - 147 mEq/L   Potassium 4.3  3.7 - 5.3 mEq/L   Chloride 106  96 - 112 mEq/L   CO2 23  19 - 32 mEq/L   Glucose, Bld 104 (*) 70 - 99 mg/dL   BUN 24 (*) 6 - 23 mg/dL   Creatinine, Ser 4.09  0.50 - 1.10 mg/dL   Calcium 8.9  8.4 - 81.1 mg/dL   GFR calc non Af Amer 70 (*) >90 mL/min   GFR calc Af Amer 81 (*) >90 mL/min  PRO B NATRIURETIC PEPTIDE     Status: Abnormal   Collection Time    10/21/13  1:20 AM      Result Value Range   Pro B Natriuretic peptide (BNP) 657.3 (*) 0 - 125 pg/mL  POCT I-STAT TROPONIN I     Status: None    Collection Time    10/21/13  1:31 AM      Result Value Range   Troponin i, poc 0.03  0.00 - 0.08 ng/mL   Comment 3                CT Head Wo Contrast (Final result)  Result time: 10/21/13 01:58:41    Final result by Rad Results In Interface (10/21/13 01:58:41)    Narrative:   CLINICAL DATA: Blurred vision and erythema at the left eye, with drainage. Intermittent mid chest pain.  EXAM: CT HEAD WITHOUT CONTRAST  TECHNIQUE: Contiguous axial images were obtained from the base of the skull through the vertex without intravenous contrast.  COMPARISON: CT of the head performed 02/15/2010  FINDINGS: There is no evidence of acute infarction, mass lesion, or intra- or extra-axial hemorrhage on CT.  Chronic encephalomalacia is noted at the left frontoparietal region, reflecting remote infarct. Scattered periventricular and subcortical white matter change likely reflects small vessel ischemic microangiopathy. A chronic lacunar infarct is seen at the left thalamus.  The posterior fossa, including the cerebellum, brainstem and fourth ventricle, is within normal limits. The third and lateral ventricles, and basal ganglia are unremarkable in appearance. No mass effect or midline shift is seen.  There is no evidence of fracture; visualized osseous structures are unremarkable in appearance. The visualized portions of the orbits are within normal limits. There is mild partial opacification of the maxillary sinuses, with a mucus retention cyst or polyp at the left maxillary sinus. There is opacification of the right ethmoid air cells and partial opacification of the right frontal sinus. Paranasal sinuses and mastoid air cells are well-aerated. No significant soft tissue abnormalities are seen.  IMPRESSION: 1. No acute intracranial pathology seen on CT. 2. Left orbit is grossly unremarkable in appearance. 3. Chronic encephalomalacia at the left frontoparietal region, reflecting  remote infarct. Scattered small vessel ischemic microangiopathy. Chronic lacunar infarct at the left thalamus. 4. Mild partial opacification of the maxillary sinuses, with a mucus retention cyst or polyp at the left maxillary sinus. Partial opacification of the right frontal sinus, and opacification of the right ethmoid air cells.   Electronically Signed By: Roanna Raider M.D. On: 10/21/2013 01:58  DG Chest Portable 1 View (Final result)  Result time: 10/21/13 01:18:16    Final result by Rad Results In Interface (10/21/13 01:18:16)    Narrative:   CLINICAL DATA: Chest pain and shortness of breath. History of smoking.  EXAM: PORTABLE CHEST - 1 VIEW  COMPARISON: Chest radiograph performed 07/30/2011  FINDINGS: The lungs are well-aerated and clear. There is no evidence of focal opacification, pleural effusion or pneumothorax.  The cardiomediastinal silhouette is within normal limits. No acute osseous abnormalities are seen.  IMPRESSION: No acute cardiopulmonary process seen.   Electronically Signed By: Roanna Raider M.D. On: 10/21/2013 01:18        MDM  Patient with reassuring ED work up - NAICP on head CT. EKG is wnl and troponin is wnl. Patient is now pain free and BP has improved. I have recommended admission for full rule out and Cardiology consultation. The patient refuses noting that her mother is in the ICU her at [facility] and that decision was just made to withdraw care. So, she will elect to leave AMA. I have described risks - including permanent disability and death of failure to dx and adequately treat ACS.  Patient acknowledges and agrees to f/u with her PCP on Monday. We will refer to Cardiology. Patient agrees to return to ED for recurrent chest pain. Conversation witnessed by Alliance Cellar, BSN.   [phone]:  Patient requests prescription for something for anxiety.   Brandt Loosen, MD 10/21/13 [phone]

## 2013-10-21 NOTE — Discharge Instructions (Signed)
Chest Pain (Nonspecific) °It is often hard to give a specific diagnosis for the cause of chest pain. There is always a chance that your pain could be related to something serious, such as a heart attack or a blood clot in the lungs. You need to follow up with your caregiver for further evaluation. °CAUSES  °· Heartburn. °· Pneumonia or bronchitis. °· Anxiety or stress. °· Inflammation around your heart (pericarditis) or lung (pleuritis or pleurisy). °· A blood clot in the lung. °· A collapsed lung (pneumothorax). It can develop suddenly on its own (spontaneous pneumothorax) or from injury (trauma) to the chest. °· Shingles infection (herpes zoster virus). °The chest wall is composed of bones, muscles, and cartilage. Any of these can be the source of the pain. °· The bones can be bruised by injury. °· The muscles or cartilage can be strained by coughing or overwork. °· The cartilage can be affected by inflammation and become sore (costochondritis). °DIAGNOSIS  °Lab tests or other studies, such as X-rays, electrocardiography, stress testing, or cardiac imaging, may be needed to find the cause of your pain.  °TREATMENT  °· Treatment depends on what may be causing your chest pain. Treatment may include: °· Acid blockers for heartburn. °· Anti-inflammatory medicine. °· Pain medicine for inflammatory conditions. °· Antibiotics if an infection is present. °· You may be advised to change lifestyle habits. This includes stopping smoking and avoiding alcohol, caffeine, and chocolate. °· You may be advised to keep your head raised (elevated) when sleeping. This reduces the chance of acid going backward from your stomach into your esophagus. °· Most of the time, nonspecific chest pain will improve within 2 to 3 days with rest and mild pain medicine. °HOME CARE INSTRUCTIONS  °· If antibiotics were prescribed, take your antibiotics as directed. Finish them even if you start to feel better. °· For the next few days, avoid physical  activities that bring on chest pain. Continue physical activities as directed. °· Do not smoke. °· Avoid drinking alcohol. °· Only take over-the-counter or prescription medicine for pain, discomfort, or fever as directed by your caregiver. °· Follow your caregiver's suggestions for further testing if your chest pain does not go away. °· Keep any follow-up appointments you made. If you do not go to an appointment, you could develop lasting (chronic) problems with pain. If there is any problem keeping an appointment, you must call to reschedule. °SEEK MEDICAL CARE IF:  °· You think you are having problems from the medicine you are taking. Read your medicine instructions carefully. °· Your chest pain does not go away, even after treatment. °· You develop a rash with blisters on your chest. °SEEK IMMEDIATE MEDICAL CARE IF:  °· You have increased chest pain or pain that spreads to your arm, neck, jaw, back, or abdomen. °· You develop shortness of breath, an increasing cough, or you are coughing up blood. °· You have severe back or abdominal pain, feel nauseous, or vomit. °· You develop severe weakness, fainting, or chills. °· You have a fever. °THIS IS AN EMERGENCY. Do not wait to see if the pain will go away. Get medical help at once. Call your local emergency services (911 in [place] Do not drive yourself to the hospital. °MAKE SURE YOU:  °· Understand these instructions. °· Will watch your condition. °· Will get help right away if you are not doing well or get worse. °Document Released: 06/24/2005 Document Revised: 12/07/2011 Document Reviewed: 04/19/2008 °Exit[employer]® Patient Information ©2014 [employer]   [employer]. ° °

## 2013-10-21 NOTE — ED Notes (Signed)
Dr Lavella LemonsManly had conversation discussing plan of care with pt.  Pt stated that she understood the plan that Dr. Lavella LemonsManly described, but would be leaving against medical advice.

## 2013-10-21 NOTE — ED Notes (Addendum)
Pt reports significant stress in her life the past couple weeks has been fighting a cold and her mother had stroke this week and is in the hospital. Pt is tearful at times. Hx strokes and MI's. 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

## 2015-09-29 DIAGNOSIS — I639 Cerebral infarction, unspecified: Secondary | ICD-10-CM

## 2015-09-29 HISTORY — DX: Cerebral infarction, unspecified: I63.9

## 2016-08-06 ENCOUNTER — Inpatient Hospital Stay (HOSPITAL_COMMUNITY)
Admission: AD | Admit: 2016-08-06 | Discharge: 2016-08-10 | DRG: 287 | Disposition: A | Discharge status: Home / Self Care | Payer: Self-pay | Source: Other Acute Inpatient Hospital | Attending: Cardiovascular Disease | Admitting: Cardiovascular Disease

## 2016-08-06 ENCOUNTER — Encounter (HOSPITAL_COMMUNITY): Payer: Self-pay | Admitting: Cardiology

## 2016-08-06 DIAGNOSIS — T148XXA Other injury of unspecified body region, initial encounter: Secondary | ICD-10-CM | POA: Diagnosis present

## 2016-08-06 DIAGNOSIS — F1410 Cocaine abuse, uncomplicated: Secondary | ICD-10-CM | POA: Diagnosis present

## 2016-08-06 DIAGNOSIS — R52 Pain, unspecified: Secondary | ICD-10-CM

## 2016-08-06 DIAGNOSIS — F17200 Nicotine dependence, unspecified, uncomplicated: Secondary | ICD-10-CM | POA: Diagnosis present

## 2016-08-06 DIAGNOSIS — Z720 Tobacco use: Secondary | ICD-10-CM | POA: Diagnosis present

## 2016-08-06 DIAGNOSIS — F419 Anxiety disorder, unspecified: Secondary | ICD-10-CM | POA: Diagnosis present

## 2016-08-06 DIAGNOSIS — I2510 Atherosclerotic heart disease of native coronary artery without angina pectoris: Secondary | ICD-10-CM | POA: Insufficient documentation

## 2016-08-06 DIAGNOSIS — I10 Essential (primary) hypertension: Secondary | ICD-10-CM | POA: Diagnosis present

## 2016-08-06 DIAGNOSIS — I200 Unstable angina: Secondary | ICD-10-CM

## 2016-08-06 DIAGNOSIS — F1490 Cocaine use, unspecified, uncomplicated: Secondary | ICD-10-CM | POA: Diagnosis present

## 2016-08-06 DIAGNOSIS — Z9851 Tubal ligation status: Secondary | ICD-10-CM

## 2016-08-06 DIAGNOSIS — R58 Hemorrhage, not elsewhere classified: Secondary | ICD-10-CM

## 2016-08-06 DIAGNOSIS — F319 Bipolar disorder, unspecified: Secondary | ICD-10-CM | POA: Diagnosis present

## 2016-08-06 DIAGNOSIS — E785 Hyperlipidemia, unspecified: Secondary | ICD-10-CM

## 2016-08-06 DIAGNOSIS — I97630 Postprocedural hematoma of a circulatory system organ or structure following a cardiac catheterization: Secondary | ICD-10-CM | POA: Diagnosis not present

## 2016-08-06 DIAGNOSIS — Y848 Other medical procedures as the cause of abnormal reaction of the patient, or of later complication, without mention of misadventure at the time of the procedure: Secondary | ICD-10-CM | POA: Diagnosis not present

## 2016-08-06 DIAGNOSIS — I714 Abdominal aortic aneurysm, without rupture: Secondary | ICD-10-CM | POA: Diagnosis present

## 2016-08-06 DIAGNOSIS — D62 Acute posthemorrhagic anemia: Secondary | ICD-10-CM

## 2016-08-06 DIAGNOSIS — I25110 Atherosclerotic heart disease of native coronary artery with unstable angina pectoris: Principal | ICD-10-CM | POA: Diagnosis present

## 2016-08-06 DIAGNOSIS — Z8249 Family history of ischemic heart disease and other diseases of the circulatory system: Secondary | ICD-10-CM

## 2016-08-06 HISTORY — DX: Other injury of unspecified body region, initial encounter: T14.8XXA

## 2016-08-06 HISTORY — DX: Atherosclerotic heart disease of native coronary artery without angina pectoris: I25.10

## 2016-08-06 HISTORY — DX: Cocaine use, unspecified, uncomplicated: F14.90

## 2016-08-06 LAB — COMPREHENSIVE METABOLIC PANEL
ALK PHOS: 92 U/L (ref 38–126)
ALT: 15 U/L (ref 14–54)
AST: 21 U/L (ref 15–41)
Albumin: 3.4 g/dL — ABNORMAL LOW (ref 3.5–5.0)
Anion gap: 9 (ref 5–15)
BUN: 15 mg/dL (ref 6–20)
CALCIUM: 9.1 mg/dL (ref 8.9–10.3)
CO2: 23 mmol/L (ref 22–32)
CREATININE: 0.73 mg/dL (ref 0.44–1.00)
Chloride: 106 mmol/L (ref 101–111)
Glucose, Bld: 104 mg/dL — ABNORMAL HIGH (ref 65–99)
Potassium: 4.2 mmol/L (ref 3.5–5.1)
Sodium: 138 mmol/L (ref 135–145)
Total Bilirubin: 0.7 mg/dL (ref 0.3–1.2)
Total Protein: 6.5 g/dL (ref 6.5–8.1)

## 2016-08-06 LAB — GLUCOSE, CAPILLARY
GLUCOSE-CAPILLARY: 106 mg/dL — AB (ref 65–99)
GLUCOSE-CAPILLARY: 132 mg/dL — AB (ref 65–99)

## 2016-08-06 LAB — MRSA PCR SCREENING: MRSA by PCR: NEGATIVE

## 2016-08-06 LAB — TROPONIN I: Troponin I: <0.03 ng/mL (ref <0.03)

## 2016-08-06 LAB — HEPARIN LEVEL (UNFRACTIONATED): Heparin Unfractionated: 0.23 IU/mL — ABNORMAL LOW (ref 0.30–0.70)

## 2016-08-06 LAB — TSH: TSH: 0.49 u[IU]/mL (ref 0.350–4.500)

## 2016-08-06 MED ORDER — ONDANSETRON HCL 4 MG/2ML IJ SOLN: 4.0000 mg | Freq: Four times a day (QID) | INTRAMUSCULAR | Status: DC | PRN

## 2016-08-06 MED ORDER — LISINOPRIL 5 MG PO TABS
5.0000 mg | ORAL_TABLET | Freq: Every day | ORAL | Status: DC
  Administered 2016-08-06 – 2016-08-10 (×5): 5 mg via ORAL
  Filled 2016-08-06 (×5): qty 1

## 2016-08-06 MED ORDER — SODIUM CHLORIDE 0.9% FLUSH
3.0000 mL | Freq: Two times a day (BID) | INTRAVENOUS | Status: DC
  Administered 2016-08-06 – 2016-08-07 (×2): 3 mL via INTRAVENOUS

## 2016-08-06 MED ORDER — SODIUM CHLORIDE 0.9% FLUSH: 3.0000 mL | INTRAVENOUS | Status: DC | PRN

## 2016-08-06 MED ORDER — ALPRAZOLAM 0.25 MG PO TABS
0.2500 mg | ORAL_TABLET | Freq: Three times a day (TID) | ORAL | Status: DC | PRN
  Administered 2016-08-06 – 2016-08-09 (×8): 0.25 mg via ORAL
  Filled 2016-08-06 (×8): qty 1

## 2016-08-06 MED ORDER — SODIUM CHLORIDE 0.9 % IV SOLN
INTRAVENOUS | Status: DC
  Administered 2016-08-07: 05:00:00 via INTRAVENOUS

## 2016-08-06 MED ORDER — HEPARIN BOLUS VIA INFUSION
4000.0000 [IU] | Freq: Once | INTRAVENOUS | Status: AC
  Administered 2016-08-06: 4000 [IU] via INTRAVENOUS
  Filled 2016-08-06: qty 4000

## 2016-08-06 MED ORDER — ASPIRIN 81 MG PO CHEW
81.0000 mg | CHEWABLE_TABLET | ORAL | Status: AC
  Administered 2016-08-07: 81 mg via ORAL
  Filled 2016-08-06: qty 1

## 2016-08-06 MED ORDER — ASPIRIN EC 81 MG PO TBEC: 81.0000 mg | DELAYED_RELEASE_TABLET | Freq: Every day | ORAL | Status: DC

## 2016-08-06 MED ORDER — INSULIN ASPART 100 UNIT/ML ~~LOC~~ SOLN
0.0000 [IU] | Freq: Three times a day (TID) | SUBCUTANEOUS | Status: DC
  Administered 2016-08-08 (×2): 2 [IU] via SUBCUTANEOUS

## 2016-08-06 MED ORDER — SODIUM CHLORIDE 0.9 % IV SOLN: 250.0000 mL | INTRAVENOUS | Status: DC | PRN

## 2016-08-06 MED ORDER — CARVEDILOL 3.125 MG PO TABS
3.1250 mg | ORAL_TABLET | Freq: Two times a day (BID) | ORAL | Status: DC
  Administered 2016-08-06 – 2016-08-10 (×8): 3.125 mg via ORAL
  Filled 2016-08-06 (×8): qty 1

## 2016-08-06 MED ORDER — HEPARIN (PORCINE) IN NACL 100-0.45 UNIT/ML-% IJ SOLN
1150.0000 [IU]/h | INTRAMUSCULAR | Status: DC
  Administered 2016-08-06: 900 [IU]/h via INTRAVENOUS
  Filled 2016-08-06: qty 250

## 2016-08-06 MED ORDER — ATORVASTATIN CALCIUM 80 MG PO TABS
80.0000 mg | ORAL_TABLET | Freq: Every day | ORAL | Status: DC
  Administered 2016-08-06 – 2016-08-07 (×2): 80 mg via ORAL
  Filled 2016-08-06 (×3): qty 1

## 2016-08-06 MED ORDER — NITROGLYCERIN 0.4 MG SL SUBL: 0.4000 mg | SUBLINGUAL_TABLET | SUBLINGUAL | Status: DC | PRN

## 2016-08-06 MED ORDER — ACETAMINOPHEN 325 MG PO TABS: 650.0000 mg | ORAL_TABLET | ORAL | Status: DC | PRN

## 2016-08-06 MED ORDER — MORPHINE SULFATE (PF) 2 MG/ML IV SOLN
2.0000 mg | Freq: Once | INTRAVENOUS | Status: AC
  Administered 2016-08-06: 2 mg via INTRAVENOUS
  Filled 2016-08-06: qty 1

## 2016-08-06 NOTE — Progress Notes (Signed)
ANTICOAGULATION CONSULT NOTE - Initial Consult  Pharmacy Consult for heparin Indication: chest pain/ACS  Allergies  Allergen Reactions  . Penicillins Swelling and Other (See Comments)    Muscle rigidness    Patient Measurements: Height: 5\' 2"  (157.5 cm) Weight: 190 lb (86.2 kg) IBW/kg (Calculated) : 50.1 Heparin Dosing Weight: 69.7 kg  Vital Signs: Temp: 98.4 F (36.9 C) (11/09 1327) Temp Source: Oral (11/09 1327) BP: 164/110 (11/09 1327) Pulse Rate: 80 (11/09 1327)  Labs: No results for input(s): HGB, HCT, PLT, APTT, LABPROT, INR, HEPARINUNFRC, HEPRLOWMOCWT, CREATININE, CKTOTAL, CKMB, TROPONINI in the last 72 hours.  CrCl cannot be calculated (Patient's most recent lab result is older than the maximum 21 days allowed.).   Medical History: Past Medical History:  Diagnosis Date  . Anxiety   . Bipolar 1 disorder (HCC)   . Depression   . Hypertension     Medications:  Prescriptions Prior to Admission  Medication Sig Dispense Refill Last Dose  . ALPRAZolam (XANAX) 0.5 MG tablet Take 1 tablet (0.5 mg total) by mouth at bedtime as needed for anxiety. (Patient not taking: Reported on 08/06/2016) 12 tablet 0 Completed Course at Unknown time  . azithromycin (ZITHROMAX Z-PAK) 250 MG tablet 2 po day one, then 1 daily x 4 days (Patient not taking: Reported on 08/06/2016) 5 tablet 0 Not Taking at Unknown time   Scheduled:  . [START ON 08/07/2016] aspirin EC  81 mg Oral Daily  . atorvastatin  80 mg Oral q1800  . carvedilol  3.125 mg Oral BID WC  . insulin aspart  0-15 Units Subcutaneous TID WC  . lisinopril  5 mg Oral Daily    Assessment: DM is a 55 year old female who [facility] with a 6 day history of intermittent chest pain, associated nausea, SOB, and diaphoresis. Yesterday inpatient she developed the same chest pain associated with left arm pain and shortness of breath. ST depression in lateral leads. Patient going for a cath. Will obtain baseline labs  and start heparin. No AC at home.   [facility]- Hgb- 14.4, Hct- 43.5, Plt- 407, Scr- 0.67, INR- 1.0  [facility]  Goal of Therapy:  Heparin level 0.3-0.7 units/ml Monitor platelets by anticoagulation protocol: Yes   Plan:  Heparin 4000 unit bolus Heparin 900 units/hr Daily Heparin level/CBC 6 hour Heparin level   Hillis RangeEmily A Stewart, PharmD PGY1 Pharmacy Resident [phone]:[phone] 08/06/2016,3:19 PM

## 2016-08-06 NOTE — H&P (Signed)
Cardiology H&P    Patient ID: Merry LoftyDebra G Manthe MRN: [id number], DOB/AGE: 55/04/1961   Admit date: 08/06/2016 Date of Consult: 08/06/2016  Primary Physician: No PCP Per Patient  Primary Cardiologist: New, seen remotely by Dr. Durenda HurtMcDowel  Patient Profile    [name] is a 55 year old female with a past medical history of anxiety/depression, polysubstance abuse, and uncontrolled HTN. She [facility] on 08[facility]16 with intermittent chest pain x 6 days with associated nausea, SOB and diaphoresis. Troponin negative x 3.   History of Present Illness    [name] began having chest pain Friday night (07/31/16)while she was sitting in her home. She describes the pain as a dull pressure that radiates to her left arm. It lasted for a few minutes and she took a aspirin which relieved her pain.   She was mostly pain free over the weekend, she did use crack cocaine on Monday 08/03/16. She describes some chest pain after using the crack cocaine. Yesterday, she developed the same chest pain with associated with left arm pain and SOB. [facility] and her EKG showed NSR with lateral T wave inversion. She was markedly hypertensive with SBP's in the 240's. Troponin negative x3. Follow up EKG this am showed worsening ST depression in the lateral leads.   She is pain free at the time of my encounter. She is tearful and anxious. She tells me that she has had cardiac caths in the past, but has never had a stent. She also says that she had an Echocardiogram while incarcerated, it sounds like she has been told in the past that she has some LV dysfunction.   Past Medical History   Past Medical History:  Diagnosis Date  . Anxiety   . Bipolar 1 disorder (HCC)   . Depression   . Hypertension     Past Surgical History:  Procedure Laterality Date  . artery cleaned    . TUBAL LIGATION       Allergies  Allergies  Allergen Reactions  . Penicillins Swelling and Other (See  Comments)    Muscle rigidness    Inpatient Medications      Family History    Family History  Problem Relation Age of Onset  . Heart attack Mother   . Heart attack Father     Social History    Social History   Social History  . Marital status: Single    Spouse name: N/A  . Number of children: N/A  . Years of education: N/A   Occupational History  . Not on file.   Social History Main Topics  . Smoking status: Current Every Day Smoker  . Smokeless tobacco: Not on file  . Alcohol use No  . Drug use:     Types: Marijuana  . Sexual activity: Not on file   Other Topics Concern  . Not on file   Social History Narrative  . No narrative on file     Review of Systems    General:  No chills, fever, night sweats or weight changes.  Cardiovascular: + chest pain, dyspnea on exertion, edema, orthopnea, palpitations, paroxysmal nocturnal dyspnea. Dermatological: No rash, lesions/masses Respiratory: + cough, dyspnea Urologic: No hematuria, dysuria Abdominal:   No nausea, vomiting, diarrhea, bright red blood per rectum, melena, or hematemesis Neurologic:  No visual changes, wkns, changes in mental status. All other systems reviewed and are otherwise negative except as noted above.  Physical Exam  Blood pressure (!) 164/110, pulse 80, temperature 98.4 F (36.9 C), temperature source Oral, resp. rate 16, SpO2 98 %.  General: Pleasant, NAD Psych: Normal affect. Neuro: Alert and oriented X 3. Moves all extremities spontaneously. HEENT: Normal  Neck: Supple without bruits or JVD. Lungs:  Resp regular and unlabored, CTA. Heart: RRR no s3, s4, or murmurs. Abdomen: Soft, non-tender, non-distended, BS + x 4.  Extremities: No clubbing, cyanosis or edema. DP/PT/Radials 2+ and equal bilaterally.  Labs     WBC 10.0 Hgb 14.4 Hct 43.5 Platelets 407  Urine positive for cocaine and cannabinoids  Scr 0.67 BUN 13   Radiology Studies    No results found.  EKG &  Cardiac Imaging    EKG: NSR with lateral ST depression and T wave inversion  Echocardiogram: pending  Assessment & Plan    1. Chest pain: has had intermittent chest pain for about 6 days. She is a very difficult historian, but it sounds like last nights episode was worse episode she has had. She endorses associated symptoms of nausea, diaphoresis and SOB. I would suspect with the long duration of her pain that she would have an elevated troponin. However with her family history and many risk factors for CAD, it might be best to evaluate her coronaries by cath.   2. HTN: She was markedly hypertensive on [facility] and has not taken any medications for BP in many years. Will start her on Metoprolol 25mg  BID. Also can start an ACE-I, renal function is stable.   3. Anxiety: Patient is anxious and tearful. Alprazolam prn ordered.   4. Polysubstance abuse: Encouraged cocaine cessation.   Signed, Little IshikawaErin E Smith, NP 08/06/2016, 2:10 PM Pager: [phone]  Patient seen, examined. Available data reviewed. Agree with findings, assessment, and plan as outlined by Suzzette RighterErin Smith, NP. The patient is independently interviewed and examined. She has progressive chest discomfort over the past week. Her symptoms of intermittent. She has multiple types of chest pain and her history is difficult as she is somewhat tangential. However, the patient does exhibit some dynamic EKG changes with T-wave inversions laterally. 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 There is a loud right carotid bruit. She is at high risk of obstructive coronary artery disease. We discussed diagnostic considerations including stress testing versus cardiac catheterization. Considering the above factors I have recommended a definitive evaluation with cardiac catheterization and possible PCI. Because of concerns about her compliance, would have a  high threshold to perform PCI.  I have reviewed the risks, indications, and alternatives to cardiac catheterization, possible angioplasty, and stenting with the patient. Risks include but are not limited to bleeding, infection, vascular injury, stroke, myocardial infection, arrhythmia, kidney injury, radiation-related injury in the case of prolonged fluoroscopy use, emergency cardiac surgery, and death. The patient understands the risks of serious complication is 1-2 in 1000 with diagnostic cardiac cath and 1-2% or less with angioplasty/stenting.   Tonny BollmanMichael Retal Tonkinson, M.D. 08/06/2016 4:00 PM

## 2016-08-07 ENCOUNTER — Inpatient Hospital Stay (HOSPITAL_COMMUNITY): Payer: Self-pay

## 2016-08-07 ENCOUNTER — Encounter (HOSPITAL_COMMUNITY)
Admission: AD | Disposition: A | Discharge status: Home / Self Care | Payer: Self-pay | Source: Other Acute Inpatient Hospital | Attending: Cardiovascular Disease

## 2016-08-07 DIAGNOSIS — I6529 Occlusion and stenosis of unspecified carotid artery: Secondary | ICD-10-CM

## 2016-08-07 DIAGNOSIS — I25110 Atherosclerotic heart disease of native coronary artery with unstable angina pectoris: Principal | ICD-10-CM

## 2016-08-07 DIAGNOSIS — R079 Chest pain, unspecified: Secondary | ICD-10-CM | POA: Insufficient documentation

## 2016-08-07 HISTORY — PX: CARDIAC CATHETERIZATION: SHX172

## 2016-08-07 LAB — HEPARIN LEVEL (UNFRACTIONATED): Heparin Unfractionated: 0.12 IU/mL — ABNORMAL LOW (ref 0.30–0.70)

## 2016-08-07 LAB — CBC
HEMATOCRIT: 38 % (ref 36.0–46.0)
HEMOGLOBIN: 12.2 g/dL (ref 12.0–15.0)
MCH: 29.9 pg (ref 26.0–34.0)
MCHC: 32.1 g/dL (ref 30.0–36.0)
MCV: 93.1 fL (ref 78.0–100.0)
Platelets: 321 10*3/uL (ref 150–400)
RBC: 4.08 MIL/uL (ref 3.87–5.11)
RDW: 13.6 % (ref 11.5–15.5)
WBC: 8 10*3/uL (ref 4.0–10.5)

## 2016-08-07 LAB — BASIC METABOLIC PANEL
ANION GAP: 8 (ref 5–15)
BUN: 19 mg/dL (ref 6–20)
CALCIUM: 8.7 mg/dL — AB (ref 8.9–10.3)
CO2: 23 mmol/L (ref 22–32)
CREATININE: 0.79 mg/dL (ref 0.44–1.00)
Chloride: 107 mmol/L (ref 101–111)
Glucose, Bld: 95 mg/dL (ref 65–99)
Potassium: 4.3 mmol/L (ref 3.5–5.1)
SODIUM: 138 mmol/L (ref 135–145)

## 2016-08-07 LAB — GLUCOSE, CAPILLARY
Glucose-Capillary: 110 mg/dL — ABNORMAL HIGH (ref 65–99)
Glucose-Capillary: 118 mg/dL — ABNORMAL HIGH (ref 65–99)
Glucose-Capillary: 113 mg/dL — ABNORMAL HIGH (ref 65–99)

## 2016-08-07 LAB — PROTIME-INR
INR: 1
Prothrombin Time: 13.2 seconds (ref 11.4–15.2)

## 2016-08-07 LAB — LIPID PANEL
CHOLESTEROL: 201 mg/dL — AB (ref 0–200)
HDL: 37 mg/dL — ABNORMAL LOW (ref >40)
LDL CALC: 120 mg/dL — AB (ref 0–99)
TRIGLYCERIDES: 222 mg/dL — AB (ref <150)
Total CHOL/HDL Ratio: 5.4 RATIO
VLDL: 44 mg/dL — ABNORMAL HIGH (ref 0–40)

## 2016-08-07 LAB — HEMOGLOBIN A1C
Hgb A1c MFr Bld: 5.3 % (ref 4.8–5.6)
MEAN PLASMA GLUCOSE: 105 mg/dL

## 2016-08-07 LAB — TROPONIN I

## 2016-08-07 SURGERY — LEFT HEART CATH AND CORONARY ANGIOGRAPHY
Anesthesia: LOCAL

## 2016-08-07 MED ORDER — ONDANSETRON HCL 4 MG/2ML IJ SOLN: 4.0000 mg | Freq: Four times a day (QID) | INTRAMUSCULAR | Status: DC | PRN

## 2016-08-07 MED ORDER — MIDAZOLAM HCL 2 MG/2ML IJ SOLN
INTRAMUSCULAR | Status: DC | PRN
  Administered 2016-08-07: 2 mg via INTRAVENOUS
  Administered 2016-08-07: 1 mg via INTRAVENOUS

## 2016-08-07 MED ORDER — HEPARIN (PORCINE) IN NACL 2-0.9 UNIT/ML-% IJ SOLN
INTRAMUSCULAR | Status: DC | PRN
  Administered 2016-08-07: 1000 mL

## 2016-08-07 MED ORDER — SODIUM CHLORIDE 0.9% FLUSH
3.0000 mL | Freq: Two times a day (BID) | INTRAVENOUS | Status: DC
  Administered 2016-08-08 – 2016-08-10 (×5): 3 mL via INTRAVENOUS

## 2016-08-07 MED ORDER — DIAZEPAM 5 MG PO TABS
5.0000 mg | ORAL_TABLET | ORAL | Status: DC | PRN
  Administered 2016-08-08 – 2016-08-10 (×5): 5 mg via ORAL
  Filled 2016-08-07 (×5): qty 1

## 2016-08-07 MED ORDER — LIDOCAINE HCL (PF) 1 % IJ SOLN
INTRAMUSCULAR | Status: DC | PRN
  Administered 2016-08-07: 20 mL
  Administered 2016-08-07: 2 mL

## 2016-08-07 MED ORDER — HEPARIN BOLUS VIA INFUSION
2000.0000 [IU] | Freq: Once | INTRAVENOUS | Status: AC
  Administered 2016-08-07: 2000 [IU] via INTRAVENOUS
  Filled 2016-08-07: qty 2000

## 2016-08-07 MED ORDER — LIDOCAINE HCL (PF) 1 % IJ SOLN
INTRAMUSCULAR | Status: AC
  Filled 2016-08-07: qty 30

## 2016-08-07 MED ORDER — HEPARIN (PORCINE) IN NACL 2-0.9 UNIT/ML-% IJ SOLN
INTRAMUSCULAR | Status: AC
  Filled 2016-08-07: qty 1000

## 2016-08-07 MED ORDER — IOPAMIDOL (ISOVUE-370) INJECTION 76%
INTRAVENOUS | Status: AC
  Filled 2016-08-07: qty 100

## 2016-08-07 MED ORDER — SODIUM CHLORIDE 0.9 % IV SOLN
INTRAVENOUS | Status: DC
  Administered 2016-08-08 (×2): via INTRAVENOUS

## 2016-08-07 MED ORDER — IOPAMIDOL (ISOVUE-370) INJECTION 76%
INTRAVENOUS | Status: DC | PRN
  Administered 2016-08-07: 75 mL via INTRA_ARTERIAL

## 2016-08-07 MED ORDER — MIDAZOLAM HCL 2 MG/2ML IJ SOLN
INTRAMUSCULAR | Status: AC
  Filled 2016-08-07: qty 2

## 2016-08-07 MED ORDER — CLOPIDOGREL BISULFATE 75 MG PO TABS
75.0000 mg | ORAL_TABLET | Freq: Every day | ORAL | Status: DC
  Administered 2016-08-08: 75 mg via ORAL
  Filled 2016-08-07: qty 1

## 2016-08-07 MED ORDER — ASPIRIN 81 MG PO CHEW
81.0000 mg | CHEWABLE_TABLET | Freq: Every day | ORAL | Status: DC
  Administered 2016-08-08 – 2016-08-10 (×3): 81 mg via ORAL
  Filled 2016-08-07 (×3): qty 1

## 2016-08-07 MED ORDER — HEPARIN SODIUM (PORCINE) 1000 UNIT/ML IJ SOLN
INTRAMUSCULAR | Status: AC
  Filled 2016-08-07: qty 1

## 2016-08-07 MED ORDER — ACETAMINOPHEN 325 MG PO TABS
650.0000 mg | ORAL_TABLET | ORAL | Status: DC | PRN
  Administered 2016-08-07 – 2016-08-10 (×4): 650 mg via ORAL
  Filled 2016-08-07 (×4): qty 2

## 2016-08-07 MED ORDER — AMLODIPINE BESYLATE 5 MG PO TABS
5.0000 mg | ORAL_TABLET | Freq: Every day | ORAL | Status: DC
  Administered 2016-08-07 – 2016-08-10 (×4): 5 mg via ORAL
  Filled 2016-08-07 (×4): qty 1

## 2016-08-07 MED ORDER — FENTANYL CITRATE (PF) 100 MCG/2ML IJ SOLN
INTRAMUSCULAR | Status: DC | PRN
  Administered 2016-08-07: 50 ug via INTRAVENOUS
  Administered 2016-08-07: 25 ug via INTRAVENOUS

## 2016-08-07 MED ORDER — ATORVASTATIN CALCIUM 80 MG PO TABS
80.0000 mg | ORAL_TABLET | Freq: Every day | ORAL | Status: DC
  Administered 2016-08-08 – 2016-08-09 (×2): 80 mg via ORAL
  Filled 2016-08-07: qty 1

## 2016-08-07 MED ORDER — FENTANYL CITRATE (PF) 100 MCG/2ML IJ SOLN
INTRAMUSCULAR | Status: AC
  Filled 2016-08-07: qty 2

## 2016-08-07 MED ORDER — ENOXAPARIN SODIUM 40 MG/0.4ML ~~LOC~~ SOLN
40.0000 mg | SUBCUTANEOUS | Status: DC
  Filled 2016-08-07 (×2): qty 0.4

## 2016-08-07 MED ORDER — VERAPAMIL HCL 2.5 MG/ML IV SOLN
INTRAVENOUS | Status: AC
  Filled 2016-08-07: qty 2

## 2016-08-07 MED ORDER — SODIUM CHLORIDE 0.9% FLUSH: 3.0000 mL | INTRAVENOUS | Status: DC | PRN

## 2016-08-07 MED ORDER — SODIUM CHLORIDE 0.9 % IV SOLN: 250.0000 mL | INTRAVENOUS | Status: DC | PRN

## 2016-08-07 SURGICAL SUPPLY — 10 items
CATH INFINITI 5FR MULTPACK ANG (CATHETERS) ×1 IMPLANT
GLIDESHEATH SLEND SS 6F .021 (SHEATH) ×1 IMPLANT
GUIDEWIRE INQWIRE 1.5J.035X260 (WIRE) IMPLANT
INQWIRE 1.5J .035X260CM (WIRE) ×2
KIT HEART LEFT (KITS) ×2 IMPLANT
PACK CARDIAC CATHETERIZATION (CUSTOM PROCEDURE TRAY) ×2 IMPLANT
SHEATH PINNACLE 5F 10CM (SHEATH) ×1 IMPLANT
SYR MEDRAD MARK V 150ML (SYRINGE) ×2 IMPLANT
TRANSDUCER W/STOPCOCK (MISCELLANEOUS) ×2 IMPLANT
TUBING CIL FLEX 10 FLL-RA (TUBING) ×2 IMPLANT

## 2016-08-07 NOTE — Progress Notes (Signed)
The patient is for cardiac catheterization today.  Jeff Selvin Yun, MD    

## 2016-08-07 NOTE — H&P (View-Only) (Signed)
The patient is for cardiac catheterization today.  Jeff Jammal Sarr, MD    

## 2016-08-07 NOTE — Progress Notes (Signed)
Site area: rt groin  Site Prior to Removal:  Level 0 Pressure Applied For:  20 minutes Manual:   yes Patient Status During Pull:  stable Post Pull Site:  Level  0 Post Pull Instructions Given:  yes Post Pull Pulses Present: yes Dressing Applied:  Gauze/tegaderm Bedrest begins @  1400 Comments:

## 2016-08-07 NOTE — Progress Notes (Signed)
ANTICOAGULATION CONSULT NOTE  Pharmacy Consult for heparin Indication: chest pain/ACS  Allergies  Allergen Reactions  . Penicillins Swelling and Other (See Comments)    Muscle rigidness    Patient Measurements: Height: 5\' 2"  (157.5 cm) Weight: 190 lb (86.2 kg) IBW/kg (Calculated) : 50.1 Heparin Dosing Weight: 69.7 kg  Vital Signs: Temp: 97.9 F (36.6 C) (11/10 0334) Temp Source: Oral (11/10 0334) BP: 155/78 (11/10 0334) Pulse Rate: 76 (11/10 0334)  Labs:  Recent Labs  08/06/16 1502 08/06/16 2126 08/07/16 0255  HGB  --   --  12.2  HCT  --   --  38.0  PLT  --   --  321  LABPROT  --   --  13.2  INR  --   --  1.00  HEPARINUNFRC  --  0.23* 0.12*  CREATININE 0.73  --  0.79  TROPONINI <0.03 <0.03 <0.03    Estimated Creatinine Clearance: 80.9 mL/min (by C-G formula based on SCr of 0.79 mg/dL).  Assessment: 55 y.o. female with chest pain for heparin  Goal of Therapy:  Heparin level 0.3-0.7 units/ml Monitor platelets by anticoagulation protocol: Yes   Plan:  Heparin 2000 units IV bolus, then increase heparin  1150 units/hr Follow up after cath today   Geannie RisenGreg Estevan Kersh, PharmD, BCPS

## 2016-08-07 NOTE — Interval H&P Note (Signed)
Cath Lab Visit (complete for each Cath Lab visit)  Clinical Evaluation Leading to the Procedure:   ACS: No.  Non-ACS:    Anginal Classification: CCS IV  Anti-ischemic medical therapy: Maximal Therapy (2 or more classes of medications)  Non-Invasive Test Results: No non-invasive testing performed  Prior CABG: No previous CABG      Cath Lab Visit (complete for each Cath Lab visit)  Clinical Evaluation Leading to the Procedure:   ACS: No.  Non-ACS:    Anginal Classification: CCS IV  Anti-ischemic medical therapy: Maximal Therapy (2 or more classes of medications)  Non-Invasive Test Results: No non-invasive testing performed  Prior CABG: No previous CABG      History and Physical Interval Note:  08/07/2016 12:40 PM  [name]  has presented today for surgery, with the diagnosis of cp  The various methods of treatment have been discussed with the patient and family. After consideration of risks, benefits and other options for treatment, the patient has consented to  Procedure(s): Left Heart Cath and Coronary Angiography (N/A) as a surgical intervention .  The patient's history has been reviewed, patient examined, no change in status, stable for surgery.  I have reviewed the patient's chart and labs.  Questions were answered to the patient's satisfaction.     Nicki Guadalajarahomas Kelly

## 2016-08-07 NOTE — Progress Notes (Addendum)
*  PRELIMINARY RESULTS* Vascular Ultrasound Carotid Duplex (Doppler) has been completed.  Preliminary findings: Findings consistent with greater than 80 percent stenosis involving the right internal carotid artery.  Left common carotid artery demonstrates no flow, appears completely occluded.  Shammie, the patients nurse was informed of the findings at 11:58. Dr. Excell Seltzerooper was contacted at 12:30.  Chauncey FischerCharlotte C Shanon Seawright 08/07/2016, 12:03 PM

## 2016-08-07 NOTE — Care Management Note (Signed)
Case Management Note  Patient Details  Name: [name] MRN: [id number] Date of Birth: 12/30/1960  Subjective/Objective:  Adm w angina, pos card cath                  Action/Plan: medical treatment   Expected Discharge Date:                  Expected Discharge Plan:  Home/Self Care  In-House Referral:     Discharge planning Services  CM Consult, Indigent [facility] Clinic  Post Acute Care Choice:    Choice offered to:     DME Arranged:    DME Agency:     HH Arranged:    HH Agency:     Status of Service:  In process, will continue to follow  If discussed at Long Length of Stay Meetings, dates discussed:    Additional Comments: lives at home, no pcp. No ins listed. Gave pt inform on [facility] if she wants to get pcp. cardiol dr Diona Brownermcdowell.  Hanley Haysowell, [name], RN 08/07/2016, 3:05 PM

## 2016-08-08 ENCOUNTER — Inpatient Hospital Stay (HOSPITAL_COMMUNITY): Payer: Self-pay

## 2016-08-08 ENCOUNTER — Encounter (HOSPITAL_COMMUNITY): Payer: Self-pay | Admitting: *Deleted

## 2016-08-08 DIAGNOSIS — F1490 Cocaine use, unspecified, uncomplicated: Secondary | ICD-10-CM | POA: Diagnosis present

## 2016-08-08 DIAGNOSIS — I10 Essential (primary) hypertension: Secondary | ICD-10-CM | POA: Diagnosis present

## 2016-08-08 DIAGNOSIS — I2510 Atherosclerotic heart disease of native coronary artery without angina pectoris: Secondary | ICD-10-CM | POA: Insufficient documentation

## 2016-08-08 DIAGNOSIS — F319 Bipolar disorder, unspecified: Secondary | ICD-10-CM | POA: Diagnosis present

## 2016-08-08 DIAGNOSIS — E785 Hyperlipidemia, unspecified: Secondary | ICD-10-CM

## 2016-08-08 DIAGNOSIS — Z720 Tobacco use: Secondary | ICD-10-CM | POA: Diagnosis present

## 2016-08-08 DIAGNOSIS — D62 Acute posthemorrhagic anemia: Secondary | ICD-10-CM

## 2016-08-08 DIAGNOSIS — F419 Anxiety disorder, unspecified: Secondary | ICD-10-CM | POA: Diagnosis present

## 2016-08-08 DIAGNOSIS — T148XXA Other injury of unspecified body region, initial encounter: Secondary | ICD-10-CM | POA: Diagnosis present

## 2016-08-08 DIAGNOSIS — R079 Chest pain, unspecified: Secondary | ICD-10-CM

## 2016-08-08 LAB — BASIC METABOLIC PANEL
ANION GAP: 6 (ref 5–15)
BUN: 17 mg/dL (ref 6–20)
CHLORIDE: 110 mmol/L (ref 101–111)
CO2: 23 mmol/L (ref 22–32)
Calcium: 8.3 mg/dL — ABNORMAL LOW (ref 8.9–10.3)
Creatinine, Ser: 0.74 mg/dL (ref 0.44–1.00)
GFR calc non Af Amer: >60 mL/min (ref >60)
Glucose, Bld: 108 mg/dL — ABNORMAL HIGH (ref 65–99)
POTASSIUM: 4.3 mmol/L (ref 3.5–5.1)
SODIUM: 139 mmol/L (ref 135–145)

## 2016-08-08 LAB — CBC
HCT: 31 % — ABNORMAL LOW (ref 36.0–46.0)
HCT: 24.9 % — ABNORMAL LOW (ref 36.0–46.0)
HEMATOCRIT: 27.2 % — AB (ref 36.0–46.0)
HEMATOCRIT: 29.7 % — AB (ref 36.0–46.0)
HEMOGLOBIN: 9.9 g/dL — AB (ref 12.0–15.0)
Hemoglobin: 8.9 g/dL — ABNORMAL LOW (ref 12.0–15.0)
Hemoglobin: 7.9 g/dL — ABNORMAL LOW (ref 12.0–15.0)
Hemoglobin: 9.8 g/dL — ABNORMAL LOW (ref 12.0–15.0)
MCH: 29.8 pg (ref 26.0–34.0)
MCH: 30.4 pg (ref 26.0–34.0)
MCH: 29.6 pg (ref 26.0–34.0)
MCH: 30.8 pg (ref 26.0–34.0)
MCHC: 31.9 g/dL (ref 30.0–36.0)
MCHC: 32.7 g/dL (ref 30.0–36.0)
MCHC: 31.7 g/dL (ref 30.0–36.0)
MCHC: 33 g/dL (ref 30.0–36.0)
MCV: 93.4 fL (ref 78.0–100.0)
MCV: 92.8 fL (ref 78.0–100.0)
MCV: 93.3 fL (ref 78.0–100.0)
MCV: 93.4 fL (ref 78.0–100.0)
PLATELETS: 273 10*3/uL (ref 150–400)
PLATELETS: 268 10*3/uL (ref 150–400)
PLATELETS: 158 10*3/uL (ref 150–400)
Platelets: 277 10*3/uL (ref 150–400)
RBC: 3.32 MIL/uL — AB (ref 3.87–5.11)
RBC: 2.93 MIL/uL — AB (ref 3.87–5.11)
RBC: 2.67 MIL/uL — ABNORMAL LOW (ref 3.87–5.11)
RBC: 3.18 MIL/uL — ABNORMAL LOW (ref 3.87–5.11)
RDW: 13.4 % (ref 11.5–15.5)
RDW: 13.3 % (ref 11.5–15.5)
RDW: 13.5 % (ref 11.5–15.5)
RDW: 13.5 % (ref 11.5–15.5)
WBC: 8.1 10*3/uL (ref 4.0–10.5)
WBC: 9.2 10*3/uL (ref 4.0–10.5)
WBC: 9.3 10*3/uL (ref 4.0–10.5)
WBC: 9.5 10*3/uL (ref 4.0–10.5)

## 2016-08-08 LAB — ECHOCARDIOGRAM COMPLETE
Height: 62 in
Weight: 3040 [oz_av]

## 2016-08-08 LAB — GLUCOSE, CAPILLARY
GLUCOSE-CAPILLARY: 139 mg/dL — AB (ref 65–99)
GLUCOSE-CAPILLARY: 130 mg/dL — AB (ref 65–99)
GLUCOSE-CAPILLARY: 114 mg/dL — AB (ref 65–99)
Glucose-Capillary: 87 mg/dL (ref 65–99)

## 2016-08-08 LAB — PREPARE RBC (CROSSMATCH)

## 2016-08-08 LAB — ABO/RH: ABO/RH(D): O POS

## 2016-08-08 MED ORDER — ACETAMINOPHEN 325 MG PO TABS
650.0000 mg | ORAL_TABLET | Freq: Once | ORAL | Status: AC
  Administered 2016-08-08: 650 mg via ORAL
  Filled 2016-08-08: qty 2

## 2016-08-08 MED ORDER — MORPHINE SULFATE (PF) 2 MG/ML IV SOLN
2.0000 mg | INTRAVENOUS | Status: AC | PRN
  Administered 2016-08-08 (×2): 2 mg via INTRAVENOUS
  Filled 2016-08-08: qty 1

## 2016-08-08 MED ORDER — SODIUM CHLORIDE 0.9 % IV SOLN
Freq: Once | INTRAVENOUS | Status: AC
  Administered 2016-08-08: 17:00:00 via INTRAVENOUS

## 2016-08-08 MED ORDER — MORPHINE SULFATE (PF) 2 MG/ML IV SOLN
2.0000 mg | INTRAVENOUS | Status: AC | PRN
  Administered 2016-08-08 (×2): 2 mg via INTRAVENOUS
  Filled 2016-08-08 (×2): qty 1

## 2016-08-08 MED ORDER — MORPHINE SULFATE (PF) 2 MG/ML IV SOLN
2.0000 mg | INTRAVENOUS | Status: DC | PRN
  Administered 2016-08-08 – 2016-08-09 (×3): 2 mg via INTRAVENOUS
  Filled 2016-08-08 (×3): qty 1

## 2016-08-08 MED ORDER — TRAMADOL HCL 50 MG PO TABS
50.0000 mg | ORAL_TABLET | Freq: Four times a day (QID) | ORAL | Status: DC | PRN
  Administered 2016-08-08 – 2016-08-10 (×3): 50 mg via ORAL
  Filled 2016-08-08 (×3): qty 1

## 2016-08-08 MED ORDER — MORPHINE SULFATE (PF) 2 MG/ML IV SOLN
INTRAVENOUS | Status: AC
  Filled 2016-08-08: qty 1

## 2016-08-08 MED ORDER — DIPHENHYDRAMINE HCL 25 MG PO CAPS
25.0000 mg | ORAL_CAPSULE | Freq: Once | ORAL | Status: AC
  Administered 2016-08-08: 25 mg via ORAL
  Filled 2016-08-08: qty 1

## 2016-08-08 NOTE — Progress Notes (Signed)
Ward Givenshris Berge, NP paged to notify of R groin hematoma.

## 2016-08-08 NOTE — Progress Notes (Signed)
Pt. C/o shooting pain in right femoral cath site. MD notified. Pt taken to CT scan. MD paged with results. Continuing to monitor pt.

## 2016-08-08 NOTE — Progress Notes (Signed)
Femostop placed on R groin. Not inflated, but applying pressure d/t positioning. Ward Givenshris Berge NP ordered placement. Growth of hematoma marked. DPs 2+.

## 2016-08-08 NOTE — Progress Notes (Signed)
  Echocardiogram 2D Echocardiogram has been performed.  [name], [name] 08/08/2016, 11:54 AM

## 2016-08-08 NOTE — Progress Notes (Signed)
    S:  H/H returned slightly lower than previous (below).  Pt stable.  O:   Vitals:   08/08/16 1200 08/08/16 1400  BP: (!) 143/72 (!) 121/57  Pulse: 84 79  Resp: 11 19  Temp:     Lab Results  Component Value Date   WBC 9.3 08/08/2016   HGB 7.9 (L) 08/08/2016   HCT 24.9 (L) 08/08/2016   MCV 93.3 08/08/2016   PLT 268 08/08/2016     A/P:  1.  R groin hematoma/ABL Anemia:  H/h stabilizing, though down slightly further since blood draw this AM.  Will transfuse 1 unit as she is below 8.  F/u cbc q6h.  Pressure dsg to right groin (fem stop has been in place).  Nicolasa Duckinghristopher Joette Schmoker, NP

## 2016-08-08 NOTE — Progress Notes (Signed)
Patient Name: [name] LoftyDebra G Kubitz Date of Encounter: 08/08/2016  Primary Cardiologist: Ival BibleS. McDowell, MD   Hospital Problem List     Principal Problem:   Unstable angina pectoris [facility](HCC) Active Problems:   CAD (coronary artery disease)   Crack cocaine use   Right Groin Hematoma   Bipolar 1 disorder (HCC)   Hypertension   Anxiety   Acute blood loss anemia   Tobacco abuse   Hyperlipidemia   Subjective   No c/p overnight, however pt c/o right groin pain and was noted to have expanding hematoma.  CT abd/pelvis notable for hematoma with adjacent stranding and hemorrhage centered in the inferior right groin region and extending throughout the upper thigh and along the anterior right flank region. The hematoma extends into the right thigh below the field of view of this study. Hematoma measures about 7.5 x 14.2 cm in the transverse plane and greater than 10 cm longitudinally.    Pt with significant R groin pain this AM.  H/H down slightly - f/u labs pending.  Inpatient Medications    . amLODipine  5 mg Oral Daily  . aspirin  81 mg Oral Daily  . atorvastatin  80 mg Oral q1800  . atorvastatin  80 mg Oral q1800  . carvedilol  3.125 mg Oral BID WC  . insulin aspart  0-15 Units Subcutaneous TID WC  . lisinopril  5 mg Oral Daily  . morphine      . sodium chloride flush  3 mL Intravenous Q12H    Vital Signs    Vitals:   08/08/16 0000 08/08/16 0403 08/08/16 0630 08/08/16 0808  BP:  (!) 146/79 116/75 (!) 143/91  Pulse: 91 65 72 86  Resp: (!) 25 (!) 22 19 13   Temp:  98.1 F (36.7 C)  97.5 F (36.4 C)  TempSrc:  Oral  Oral  SpO2: 100% 95% 99% 95%  Weight:      Height:        Intake/Output Summary (Last 24 hours) at 08/08/16 [id number] Last data filed at 08/08/16 0800  Gross per 24 hour  Intake           3977.5 ml  Output             1200 ml  Net           2777.5 ml   Filed Weights   08/06/16 1400  Weight: 190 lb (86.2 kg)    Physical Exam   GEN: Well nourished, well  developed, in no acute distress.  HEENT: Grossly normal.  Neck: Supple, no JVD, carotid bruits, or masses. Cardiac: RRR, no murmurs, rubs, or gallops. No clubbing, cyanosis, edema.  Radials/DP/PT 2+ and equal bilaterally. R groin markedly ecchymotic with large hematoma extending from sheath insertion site medially toward inner thigh and pelvic area - very tender.  No bruit. Respiratory:  Respirations regular and unlabored, scattered rhonchi. GI: Soft, nontender, nondistended, BS + x 4.  No flank tenderness or bruising. MS: no deformity or atrophy. Skin: warm and dry, no rash. Neuro:  Strength and sensation are intact. Psych: AAOx3.  Normal affect.  Labs    CBC  Recent Labs  08/07/16 0255 08/08/16 0225  WBC 8.0 8.1  HGB 12.2 9.9*  HCT 38.0 31.0*  MCV 93.1 93.4  PLT 321 277   Basic Metabolic Panel  Recent Labs  08/07/16 0255 08/08/16 0225  NA 138 139  K 4.3 4.3  CL 107 110  CO2 23 23  GLUCOSE 95  108*  BUN 19 17  CREATININE 0.79 0.74  CALCIUM 8.7* 8.3*   Liver Function Tests  Recent Labs  08/06/16 1502  AST 21  ALT 15  ALKPHOS 92  BILITOT 0.7  PROT 6.5  ALBUMIN 3.4*   Cardiac Enzymes  Recent Labs  08/06/16 1502 08/06/16 2126 08/07/16 0255  TROPONINI <0.03 <0.03 <0.03   Hemoglobin A1C  Recent Labs  08/06/16 1502  HGBA1C 5.3   Fasting Lipid Panel  Recent Labs  08/07/16 0255  CHOL 201*  HDL 37*  LDLCALC 120*  TRIG 222*  CHOLHDL 5.4   Thyroid Function Tests  Recent Labs  08/06/16 1502  TSH 0.490    Telemetry    rsr  Radiology    Ct Abdomen Pelvis Wo Contrast  Result Date: 08/08/2016 CLINICAL DATA:  Retroperitoneal bleed. History of tubal ligation. Smoker. EXAM: CT ABDOMEN AND PELVIS WITHOUT CONTRAST TECHNIQUE: Multidetector CT imaging of the abdomen and pelvis was performed following the standard protocol without IV contrast. COMPARISON:  07/30/2011 FINDINGS: Lower chest: No acute abnormality. Hepatobiliary: No focal liver  abnormality is seen. No gallstones, gallbladder wall thickening, or biliary dilatation. Pancreas: Unremarkable. No pancreatic ductal dilatation or surrounding inflammatory changes. Spleen: Normal in size without focal abnormality. Adrenals/Urinary Tract: No adrenal gland nodules. Cyst on the upper pole of the left kidney. No hydronephrosis or hydroureter. Left renal atrophy. Increased density in the right renal collecting system may indicate concentration of urine or hemorrhage. Bladder wall is not thickened. No filling defects. Stomach/Bowel: Stomach is within normal limits. Appendix appears normal. No evidence of bowel wall thickening, distention, or inflammatory changes. Vascular/Lymphatic: Aortic atherosclerosis. Infrarenal abdominal aortic aneurysm measuring 4 cm in AP direction. This is enlarging from 3 cm on the previous study. No retroperitoneal lymphadenopathy. Reproductive: Uterus is mildly enlarged and somewhat lobular suggesting fibroids. Left ovarian cyst measuring 2.9 cm diameter, likely functional. No abnormal adnexal masses. Other: There is a large soft tissue hematoma with adjacent stranding and hemorrhage centered in the inferior right groin region and extending throughout the upper thigh and along the anterior right flank region. The hematoma extends into the right thigh below the field of view of this study. Hematoma measures about 7.5 x 14.2 cm in the transverse plane and greater than 10 cm longitudinally. Musculoskeletal: Degenerative changes in the spine. 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 IMPRESSION: 1. Large hematoma in the soft tissues of the right groin and anterior right thigh. 2. Developing abdominal aortic aneurysm since previous study, measuring 4 cm in AP direction. 3. Increased density in the right renal collecting system may indicate concentration of urine or hemorrhage. These results will be called to the ordering clinician or representative by the Radiologist  Assistant, and communication documented in the PACS or zVision Dashboard. Electronically Signed   By: Burman Nieves M.D.   On: 08/08/2016 06:03    Patient Profile     55 y/o ? with a h/o bipolar d/o, anxiety, HTN, HL, tob abuse, crack cocaine abuse, and obesity who presented 11/9 with chest pain and neg trop in the setting of crack cocaine abuse.  Cath revealed occluded RCA with moderate left system dzs  med rx recommended.  Post-cath 11/10, developed large right groin hematoma with drop in H/H.  Assessment & Plan    1.  Unstable Angina/CAD:  Presented with c/p in the setting of crack cocaine.  S/p cath on 11/10 revealing CTO of the RCA with moderate, diffuse LAD dzs.  Med Rx was recommended.  She  has not had recurrence of c/p.  Plavix started post-cath, however I will hold in setting of large hematoma.  Cont asa, statin,  blocker, acei.  2.  R Groin Hematoma/Acute blood loss anemia:  Post-cath pt c/o R groin pain. CT early this morning confirmed large hematoma with evidence of active bleeding.  H/H down from 12.2/38.0  9.9/31.  Hemodynamically stable.  No bruit.  Good distal pulse. F/u stat CBC and type and screen.  Manual pressure being applied now.  Pain mgmt with prn morphine for now.  With outpt elicit drug abuse, we will need to wean quickly.  Low threshold to transfuse and/or re-image for further h/h drop @ which point she would likely also require vascular surgery eval.  3.  HTN:  BP up this AM in setting of pain.  Cont current meds.  I won't titrate as I expect that she will be receiving a fair amt of pain meds over the next few hrs r/t hematoma.  4.  HL:  LDL 120.  Statin started.  5.  Tob Abuse/Crack Cocaine Abuse:  Cessation advised.  Would benefit from Social Work eval for [employer].  Signed, Nicolasa Duckinghristopher Berge NP 08/08/2016, 9:22 AM   Patient seen with NP, agree with the above note.  Femstop placed this morning.  Still with significant pain at right groin cath site.   Hemoglobin dropped about 1 g/dL over 7 hrs.  Will repeat CBC at 2 pm, hopefully will have stabilized.  If further significant drop, will consult vascular surgery.  Transfuse hgb < 8 with active bleeding.   Marca AnconaDalton McLean 08/08/2016 10:57 AM

## 2016-08-09 ENCOUNTER — Inpatient Hospital Stay (HOSPITAL_COMMUNITY): Payer: Self-pay

## 2016-08-09 DIAGNOSIS — F1490 Cocaine use, unspecified, uncomplicated: Secondary | ICD-10-CM

## 2016-08-09 LAB — VAS US CAROTID
LEFT ECA DIAS: -20 cm/s
LEFT VERTEBRAL DIAS: -13 cm/s
RCCAPDIAS: 20 cm/s
RCCAPSYS: 53 cm/s
RIGHT ECA DIAS: -22 cm/s
RIGHT VERTEBRAL DIAS: -34 cm/s
Right cca dist sys: -112 cm/s

## 2016-08-09 LAB — TYPE AND SCREEN
ABO/RH(D): O POS
ANTIBODY SCREEN: NEGATIVE
Unit division: 0

## 2016-08-09 LAB — CBC
HCT: 28.7 % — ABNORMAL LOW (ref 36.0–46.0)
Hemoglobin: 9.3 g/dL — ABNORMAL LOW (ref 12.0–15.0)
MCH: 29.7 pg (ref 26.0–34.0)
MCHC: 32.4 g/dL (ref 30.0–36.0)
MCV: 91.7 fL (ref 78.0–100.0)
PLATELETS: 226 10*3/uL (ref 150–400)
RBC: 3.13 MIL/uL — AB (ref 3.87–5.11)
RDW: 13.8 % (ref 11.5–15.5)
WBC: 9.5 10*3/uL (ref 4.0–10.5)

## 2016-08-09 LAB — BASIC METABOLIC PANEL
ANION GAP: 6 (ref 5–15)
BUN: 13 mg/dL (ref 6–20)
CALCIUM: 8.5 mg/dL — AB (ref 8.9–10.3)
CO2: 24 mmol/L (ref 22–32)
Chloride: 110 mmol/L (ref 101–111)
Creatinine, Ser: 0.72 mg/dL (ref 0.44–1.00)
GLUCOSE: 92 mg/dL (ref 65–99)
Potassium: 3.9 mmol/L (ref 3.5–5.1)
SODIUM: 140 mmol/L (ref 135–145)

## 2016-08-09 LAB — GLUCOSE, CAPILLARY
GLUCOSE-CAPILLARY: 96 mg/dL (ref 65–99)
GLUCOSE-CAPILLARY: 105 mg/dL — AB (ref 65–99)
Glucose-Capillary: 127 mg/dL — ABNORMAL HIGH (ref 65–99)

## 2016-08-09 NOTE — Progress Notes (Signed)
Patient Name: [name] Date of Encounter: 08/09/2016  Primary Cardiologist: Ival BibleS. McDowell, MD   Hospital Problem List     Principal Problem:   Unstable angina pectoris [facility](HCC) Active Problems:   CAD (coronary artery disease)   Crack cocaine use   Right Groin Hematoma   Bipolar 1 disorder (HCC)   Hypertension   Anxiety   Acute blood loss anemia   Tobacco abuse   Hyperlipidemia   Subjective   Feels better this AM.  Groin less sore and softer.  Tolerated PRBC's well yesterday.  H/H stable.  Inpatient Medications    . amLODipine  5 mg Oral Daily  . aspirin  81 mg Oral Daily  . atorvastatin  80 mg Oral q1800  . atorvastatin  80 mg Oral q1800  . carvedilol  3.125 mg Oral BID WC  . insulin aspart  0-15 Units Subcutaneous TID WC  . lisinopril  5 mg Oral Daily  . sodium chloride flush  3 mL Intravenous Q12H    Vital Signs    Vitals:   08/09/16 0342 08/09/16 0400 08/09/16 0804 08/09/16 0839  BP: (!) 157/72  109/80 109/80  Pulse:      Resp: (!) 22 17 (!) 22   Temp: 98.1 F (36.7 C)  99.3 F (37.4 C)   TempSrc: Oral  Oral   SpO2: 100%     Weight:      Height:        Intake/Output Summary (Last 24 hours) at 08/09/16 0909 Last data filed at 08/09/16 [id number]  Gross per 24 hour  Intake             2734 ml  Output             1450 ml  Net             1284 ml   Filed Weights   08/06/16 1400  Weight: 190 lb (86.2 kg)    Physical Exam   GEN: Well nourished, well developed, in no acute distress.  HEENT: Grossly normal.  Neck: Supple, no JVD, carotid bruits, or masses. Cardiac: RRR, no murmurs, rubs, or gallops. No clubbing, cyanosis, edema.  Radials/DP/PT 2+ and equal bilaterally. R groin markedly ecchymotic extending medially, distally, and posteriorly toward R flank.  Area much softer this AM, less tender.  No bruit.   Respiratory:  Respirations regular and unlabored, scattered rhonchi. GI: Soft, nontender, nondistended, BS + x 4.  No flank tenderness or  bruising. MS: no deformity or atrophy. Skin: warm and dry, no rash. Neuro:  Strength and sensation are intact. Psych: AAOx3.  Normal affect.  Labs    CBC  Recent Labs  08/08/16 2149 08/09/16 0404  WBC 9.5 9.5  HGB 9.8* 9.3*  HCT 29.7* 28.7*  MCV 93.4 91.7  PLT 158 226   Basic Metabolic Panel  Recent Labs  08/08/16 0225 08/09/16 0404  NA 139 140  K 4.3 3.9  CL 110 110  CO2 23 24  GLUCOSE 108* 92  BUN 17 13  CREATININE 0.74 0.72  CALCIUM 8.3* 8.5*   Liver Function Tests  Recent Labs  08/06/16 1502  AST 21  ALT 15  ALKPHOS 92  BILITOT 0.7  PROT 6.5  ALBUMIN 3.4*   Cardiac Enzymes  Recent Labs  08/06/16 1502 08/06/16 2126 08/07/16 0255  TROPONINI <0.03 <0.03 <0.03   Hemoglobin A1C  Recent Labs  08/06/16 1502  HGBA1C 5.3   Fasting Lipid Panel  Recent Labs  08/07/16  0255  CHOL 201*  HDL 37*  LDLCALC 120*  TRIG 222*  CHOLHDL 5.4   Thyroid Function Tests  Recent Labs  08/06/16 1502  TSH 0.490    Telemetry    rsr  Radiology    Ct Abdomen Pelvis Wo Contrast  Result Date: 08/08/2016 CLINICAL DATA:  Retroperitoneal bleed. History of tubal ligation. Smoker. EXAM: CT ABDOMEN AND PELVIS WITHOUT CONTRAST TECHNIQUE: Multidetector CT imaging of the abdomen and pelvis was performed following the standard protocol without IV contrast. COMPARISON:  07/30/2011 FINDINGS: Lower chest: No acute abnormality. Hepatobiliary: No focal liver abnormality is seen. No gallstones, gallbladder wall thickening, or biliary dilatation. Pancreas: Unremarkable. No pancreatic ductal dilatation or surrounding inflammatory changes. Spleen: Normal in size without focal abnormality. Adrenals/Urinary Tract: No adrenal gland nodules. Cyst on the upper pole of the left kidney. No hydronephrosis or hydroureter. Left renal atrophy. Increased density in the right renal collecting system may indicate concentration of urine or hemorrhage. Bladder wall is not thickened. No  filling defects. Stomach/Bowel: Stomach is within normal limits. Appendix appears normal. No evidence of bowel wall thickening, distention, or inflammatory changes. Vascular/Lymphatic: Aortic atherosclerosis. Infrarenal abdominal aortic aneurysm measuring 4 cm in AP direction. This is enlarging from 3 cm on the previous study. No retroperitoneal lymphadenopathy. Reproductive: Uterus is mildly enlarged and somewhat lobular suggesting fibroids. Left ovarian cyst measuring 2.9 cm diameter, likely functional. No abnormal adnexal masses. Other: There is a large soft tissue hematoma with adjacent stranding and hemorrhage centered in the inferior right groin region and extending throughout the upper thigh and along the anterior right flank region. The hematoma extends into the right thigh below the field of view of this study. Hematoma measures about 7.5 x 14.2 cm in the transverse plane and greater than 10 cm longitudinally. Musculoskeletal: Degenerative changes in the spine. 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 IMPRESSION: 1. Large hematoma in the soft tissues of the right groin and anterior right thigh. 2. Developing abdominal aortic aneurysm since previous study, measuring 4 cm in AP direction. 3. Increased density in the right renal collecting system may indicate concentration of urine or hemorrhage. These results will be called to the ordering clinician or representative by the Radiologist Assistant, and communication documented in the PACS or zVision Dashboard. Electronically Signed   By: Burman NievesWilliam  Stevens M.D.   On: 08/08/2016 06:03    Patient Profile     55 y/o ? with a h/o bipolar d/o, anxiety, HTN, HL, tob abuse, crack cocaine abuse, and obesity who presented 11/9 with chest pain and neg trop in the setting of crack cocaine abuse.  Cath revealed occluded RCA with moderate left system dzs  med rx recommended.  Post-cath 11/10, developed large right groin hematoma with drop in H/H.  Assessment  & Plan    1.  Unstable Angina/CAD:  Presented with c/p in the setting of crack cocaine.  S/p cath on 11/10 revealing CTO of the RCA with moderate, diffuse LAD dzs.  Med Rx was recommended.  She has not had recurrence of c/p.  Plavix started post-cath, held given hematoma and absence of ACS/stenting.  Cont asa, statin,  blocker, acei.  2.  R Groin Hematoma/Acute blood loss anemia:  Post-cath pt c/o R groin pain. CT early morning 11/11 confirmed large hematoma with evidence of active bleeding.  H/H down from 12.2/38.0  9.9/31  8.9/27/2  7.9/24.9.  Given one unit of PRBC's yesterday.  H/H 9.3/28.7 this AM.  Hemodynamically stable.  No bruit.  Good distal  pulse. Area remains very ecchymotic but much softer and less tender than 11/11.  Requiring less prn morphine.  Bedrest today.  F/u CBC in AM.  ? Repeat CT to re-eval groin (along with mention of ? Renal hemorrhage) - will d/w Dr. Shirlee Latch.  3.  HTN:  BP stable.  Cont  blocker and acei.  4.  HL:  LDL 120.  Statin started.  5.  Tob Abuse/Crack Cocaine Abuse:  Cessation advised.  Would benefit from Social Work eval for [employer]  Will need to be mindful of this when consider pain meds for groin @ d/c.  6.  Developing AAA:  Noted on CT 11/11 - 4 cm.  Will need f/u.  Signed, Nicolasa Ducking NP 08/09/2016, 9:09 AM   Patient seen with NP, agree with the above note.  Patient got a unit PRBCs yesterday, hemoglobin now appears stable.  Hematoma area softer.  I am going to repeat CT w/o contrast today to reassess groin as well as right renal collecting system (urine versus hemorrhage on CT 11/11).    If she remains stable, hopefully home in am.   Marca Ancona 08/09/2016 9:47 AM

## 2016-08-10 ENCOUNTER — Encounter (HOSPITAL_COMMUNITY): Payer: Self-pay | Admitting: Cardiovascular Disease

## 2016-08-10 DIAGNOSIS — Z720 Tobacco use: Secondary | ICD-10-CM

## 2016-08-10 DIAGNOSIS — E7800 Pure hypercholesterolemia, unspecified: Secondary | ICD-10-CM

## 2016-08-10 DIAGNOSIS — I10 Essential (primary) hypertension: Secondary | ICD-10-CM

## 2016-08-10 LAB — GLUCOSE, CAPILLARY
GLUCOSE-CAPILLARY: 103 mg/dL — AB (ref 65–99)
Glucose-Capillary: 105 mg/dL — ABNORMAL HIGH (ref 65–99)

## 2016-08-10 LAB — CBC
HEMATOCRIT: 28.9 % — AB (ref 36.0–46.0)
Hemoglobin: 9.6 g/dL — ABNORMAL LOW (ref 12.0–15.0)
MCH: 30.6 pg (ref 26.0–34.0)
MCHC: 33.2 g/dL (ref 30.0–36.0)
MCV: 92 fL (ref 78.0–100.0)
PLATELETS: 262 10*3/uL (ref 150–400)
RBC: 3.14 MIL/uL — ABNORMAL LOW (ref 3.87–5.11)
RDW: 13.6 % (ref 11.5–15.5)
WBC: 11.9 10*3/uL — AB (ref 4.0–10.5)

## 2016-08-10 MED ORDER — NITROGLYCERIN 0.4 MG SL SUBL: 0.4000 mg | SUBLINGUAL_TABLET | SUBLINGUAL | 3 refills | Status: DC | PRN

## 2016-08-10 MED ORDER — AMLODIPINE BESYLATE 5 MG PO TABS: 5.0000 mg | ORAL_TABLET | Freq: Every day | ORAL | 6 refills | Status: DC

## 2016-08-10 MED ORDER — LISINOPRIL 5 MG PO TABS: 5.0000 mg | ORAL_TABLET | Freq: Every day | ORAL | 6 refills | Status: DC

## 2016-08-10 MED ORDER — CARVEDILOL 3.125 MG PO TABS: 3.1250 mg | ORAL_TABLET | Freq: Two times a day (BID) | ORAL | 6 refills | Status: DC

## 2016-08-10 MED ORDER — ATORVASTATIN CALCIUM 80 MG PO TABS
80.0000 mg | ORAL_TABLET | Freq: Every day | ORAL | 6 refills | Status: DC
Start: 2016-08-10 — End: 2016-08-19

## 2016-08-10 MED ORDER — ASPIRIN 81 MG PO CHEW: 81.0000 mg | CHEWABLE_TABLET | Freq: Every day | ORAL | Status: DC

## 2016-08-10 MED FILL — Heparin Sodium (Porcine) Inj 1000 Unit/ML: INTRAMUSCULAR | Qty: 10 | Status: AC

## 2016-08-10 MED FILL — Verapamil HCl IV Soln 2.5 MG/ML: INTRAVENOUS | Qty: 2 | Status: AC

## 2016-08-10 NOTE — Progress Notes (Signed)
Dc via wc to home with daughter

## 2016-08-10 NOTE — Discharge Summary (Signed)
Discharge Summary    Patient ID: [name],  MRN: [id number], DOB/AGE: 55/04/1961 55 y.o.  Admit date: 08/06/2016 Discharge date: 08/10/2016  Primary Care Provider: No PCP Per Patient Primary Cardiologist: (New) Dr. Diona BrownerMcDowell  Discharge Diagnoses    Principal Problem:   Unstable angina pectoris [facility](HCC) Active Problems:   CAD (coronary artery disease)   Bipolar 1 disorder (HCC)   Hypertension   Anxiety   Crack cocaine use   Tobacco abuse   Right Groin Hematoma   Acute blood loss anemia   Hyperlipidemia   Allergies Allergies  Allergen Reactions  . Penicillins Swelling and Other (See Comments)    Muscle rigidness    Diagnostic Studies/Procedures    LHC: 08/07/16  Conclusion     There is mild left ventricular systolic dysfunction.  The left ventricular ejection fraction is 45-50% by visual estimate.  There is no mitral valve regurgitation.  LV end diastolic pressure is mildly elevated.  Mid RCA lesion, 100 %stenosed.  Dist RCA lesion, 75 %stenosed.  Mid LAD-2 lesion, 60 %stenosed.  Mid LAD-1 lesion, 70 %stenosed.  Dist LAD-2 lesion, 45 %stenosed.  Dist LAD-1 lesion, 50 %stenosed.  2nd Mrg lesion, 40 %stenosed.   Moderate LV dysfunction with upper mid to basal inferior hypocontractility.  Global ejection fraction is 45-50%.  Multivessel  CAD with calcification of the proximal LAD with 70% followed by 60% stenoses between the first and second diagonal vessel with segmental 50% stenoses in the mid distal LAD; 40-50% diffuse narrowing in the distal circumflex marginal branch; an old total occlusion of the proximal RCA with bridging collaterals.  RECOMMENDATION: With the patient's long-standing history of tobacco use, polysubstance abuse, without taking medications and potential for noncompliance, recommend an initial attempt at aggressive medical management prior to any attempt at intervention to the LAD system.  With her hypertensive history  will initiate amlodipine, both for blood pressure and potential ischemic and vasospastic benefit, continue carvedilol, high potency statin therapy  and possibly add nitrates and ACE/ARB if needed.  Will initiate aspirin and generic clopidogrel.     TTE: 08/08/16  ------------------------------------------------------------------- Study Conclusions  - Left ventricle: The cavity size was normal. There was mild   concentric hypertrophy. Systolic function was normal. The   estimated ejection fraction was in the range of 55% to 60%. Wall   motion was normal; there were no regional wall motion   abnormalities. Doppler parameters are consistent with abnormal   left ventricular relaxation (grade 1 diastolic dysfunction). - Left atrium: The atrium was mildly dilated. - Atrial septum: No defect or patent foramen ovale was identified. _____________   History of Present Illness     Ms. [name] began having chest pain Friday night (07/31/16)while she was sitting in her home. She described the pain as a dull pressure that radiated into her left arm. It lasted for a few minutes and she took a aspirin which relieved her pain.   She was mostly pain free over the weekend, she did use crack cocaine on Monday 08/03/16. She described some chest pain after using the crack cocaine. On 11/8 she developed the same chest pain with associated with left arm pain and SOB. [facility] and her EKG showed NSR with lateral T wave inversion. She was markedly hypertensive with SBP's in the 240's. Troponin negative x3. Follow up EKG this am showed worsening ST depression in the lateral leads.   She was pain free at the time of encounter. She  was tearful and anxious. Stated that she has had cardiac caths in the past, but has never had a stent. She also says that she had an Echocardiogram while incarcerated, it sounds like she has been told in the past that she has some LV dysfunction.   Hospital Course      Consultants: None  She was admitted and underwent LHC with Dr. Tresa Endo on 11/10 showing CTO of the RCA with moderate diffuse LAD disease. Medical therapy was recommended. She was initially started on plavix post cath, but developed a large hematoma. Plavix was held. CT scan showed a large hematoma with evidence of active bleeding. Her H/H dropped from 12.2/38.0-->9.9/31. Distal pulses remained intact.  Femstop was placed to the groin site. Follow up CBC noted further decline in H/H to 7.9/24.9. She was transfused one unit of PRBCs with improvement in H/H to 9.3/28.7 the following morning. Area was noted to remain very ecchymotic, but softer/less tender. Decision was made to obtain follow up CT, which noted improvement. Labs on 11/13 noted H/H of 9.6/28.9  She will be continued on ASA, statin, BB and ACEi. Echo was checked and noted EF of 55-60%. Given her coronary disease, statin therapy was added also. Of noted developing AAA was noted on CT from 11/11, and will need additional follow up in the future. During her admission, cessation from tobacco and cocaine use were strongly advised.   On 11/13 she was seen and assessed by Dr. Clifton James and determined stable for discharge home. Will arrange for follow in the [facility] office as she has been seen here in the past. _____________  Discharge Vitals Blood pressure (!) 177/88, pulse 85, temperature 97.8 F (36.6 C), temperature source Oral, resp. rate (!) 22, height 5\' 2"  (1.575 m), weight 86.2 kg (190 lb), SpO2 95 %.  Filed Weights   08/06/16 1400  Weight: 86.2 kg (190 lb)    Labs & Radiologic Studies    CBC  Recent Labs  08/09/16 0404 08/10/16 0202  WBC 9.5 11.9*  HGB 9.3* 9.6*  HCT 28.7* 28.9*  MCV 91.7 92.0  PLT 226 262   Basic Metabolic Panel  Recent Labs  08/08/16 0225 08/09/16 0404  NA 139 140  K 4.3 3.9  CL 110 110  CO2 23 24  GLUCOSE 108* 92  BUN 17 13  CREATININE 0.74 0.72  CALCIUM 8.3* 8.5*   Liver Function  Tests No results for input(s): AST, ALT, ALKPHOS, BILITOT, PROT, ALBUMIN in the last 72 hours. No results for input(s): LIPASE, AMYLASE in the last 72 hours. Cardiac Enzymes No results for input(s): CKTOTAL, CKMB, CKMBINDEX, TROPONINI in the last 72 hours. BNP Invalid input(s): POCBNP D-Dimer No results for input(s): DDIMER in the last 72 hours. Hemoglobin A1C No results for input(s): HGBA1C in the last 72 hours. Fasting Lipid Panel No results for input(s): CHOL, HDL, LDLCALC, TRIG, CHOLHDL, LDLDIRECT in the last 72 hours. Thyroid Function Tests No results for input(s): TSH, T4TOTAL, T3FREE, THYROIDAB in the last 72 hours.  Invalid input(s): FREET3 _____________  Ct Abdomen Pelvis Wo Contrast  Result Date: 08/09/2016 CLINICAL DATA:  Recent heart catheterization with resulting right groin hematoma. EXAM: CT ABDOMEN AND PELVIS WITHOUT CONTRAST TECHNIQUE: Multidetector CT imaging of the abdomen and pelvis was performed following the standard protocol without IV contrast. COMPARISON:  August 08, 2016 FINDINGS: Lower chest: Minimal opacity in the lingula not seen on yesterday's study, likely atelectasis. The lung bases are otherwise unchanged. Hepatobiliary: No focal liver abnormality is seen. No  gallstones, gallbladder wall thickening, or biliary dilatation. Pancreas: Unremarkable. No pancreatic ductal dilatation or surrounding inflammatory changes. Spleen: Normal in size without focal abnormality. Adrenals/Urinary Tract: A left renal cyst is identified. The kidneys are unremarkable with no evidence of obstruction. The contrast in the renal collecting systems yesterday is no longer visualized. Stomach/Bowel: The stomach and small bowel are normal. The colon and appendix are unremarkable. Vascular/Lymphatic: The infrarenal abdominal aorta remains aneurysmal measuring up to 4.1 cm on yesterday's study with return to normal caliber at the bifurcation. Atherosclerosis seen in the aorta, iliac vessels,  and femoral vessels. No adenopathy. Reproductive: The uterus and right ovary unremarkable. A 2.9 cm low-attenuation mass, likely a cyst, is associated with the left ovary. Other: The large hematoma in the right thigh is again identified. The maximum dimension is 9.3 x 6.2 cm today versus 14.2 x 7.5 cm previously. Musculoskeletal: No acute or significant osseous findings. IMPRESSION: 1. Large hematoma in the right thigh, improved in the interval. 2. 2.9 cm left ovarian cyst. If the patient is premenopausal, no additional imaging is necessary. If the patient is postmenopausal, recommend ultrasound for better evaluation if not previously performed. 3. Stable infrarenal abdominal aortic aneurysm measuring 4.1 cm. Recommend followup by ultrasound in 1 year. This recommendation follows ACR consensus guidelines: White Paper of the ACR Incidental Findings Committee II on Vascular Findings. J Am Coll Radiol 2013; 10:789-794. 4. Atherosclerosis in the abdominal aorta. Electronically Signed   By: Gerome Sam III M.D   On: 08/09/2016 12:18   Ct Abdomen Pelvis Wo Contrast  Result Date: 08/08/2016 CLINICAL DATA:  Retroperitoneal bleed. History of tubal ligation. Smoker. EXAM: CT ABDOMEN AND PELVIS WITHOUT CONTRAST TECHNIQUE: Multidetector CT imaging of the abdomen and pelvis was performed following the standard protocol without IV contrast. COMPARISON:  07/30/2011 FINDINGS: Lower chest: No acute abnormality. Hepatobiliary: No focal liver abnormality is seen. No gallstones, gallbladder wall thickening, or biliary dilatation. Pancreas: Unremarkable. No pancreatic ductal dilatation or surrounding inflammatory changes. Spleen: Normal in size without focal abnormality. Adrenals/Urinary Tract: No adrenal gland nodules. Cyst on the upper pole of the left kidney. No hydronephrosis or hydroureter. Left renal atrophy. Increased density in the right renal collecting system may indicate concentration of urine or hemorrhage. Bladder  wall is not thickened. No filling defects. Stomach/Bowel: Stomach is within normal limits. Appendix appears normal. No evidence of bowel wall thickening, distention, or inflammatory changes. Vascular/Lymphatic: Aortic atherosclerosis. Infrarenal abdominal aortic aneurysm measuring 4 cm in AP direction. This is enlarging from 3 cm on the previous study. No retroperitoneal lymphadenopathy. Reproductive: Uterus is mildly enlarged and somewhat lobular suggesting fibroids. Left ovarian cyst measuring 2.9 cm diameter, likely functional. No abnormal adnexal masses. Other: There is a large soft tissue hematoma with adjacent stranding and hemorrhage centered in the inferior right groin region and extending throughout the upper thigh and along the anterior right flank region. The hematoma extends into the right thigh below the field of view of this study. Hematoma measures about 7.5 x 14.2 cm in the transverse plane and greater than 10 cm longitudinally. Musculoskeletal: Degenerative changes in the spine. 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 IMPRESSION: 1. Large hematoma in the soft tissues of the right groin and anterior right thigh. 2. Developing abdominal aortic aneurysm since previous study, measuring 4 cm in AP direction. 3. Increased density in the right renal collecting system may indicate concentration of urine or hemorrhage. These results will be called to the ordering clinician or representative by the Radiologist Assistant, and  communication documented in the PACS or zVision Dashboard. Electronically Signed   By: Burman Nieves M.D.   On: 08/08/2016 06:03   Disposition   Pt is being discharged home today in good condition.  Follow-up Plans & Appointments    Follow-up Information    Sharee Pimple Follow up on 08/19/2016.   Why:  at 1:50pm for your hospital follow up appt.  Contact information: [facility] [address] [place] [address]         Discharge  Instructions    Call MD for:  redness, tenderness, or signs of infection (pain, swelling, redness, odor or green/yellow discharge around incision site)    Complete by:  As directed    Call MD for:  severe uncontrolled pain    Complete by:  As directed    At hematoma site.   Diet - low sodium heart healthy    Complete by:  As directed    Discharge instructions    Complete by:  As directed    Groin Site Care Refer to this sheet in the next few weeks. These instructions provide you with information on caring for yourself after your procedure. Your caregiver may also give you more specific instructions. Your treatment has been planned according to current medical practices, but problems sometimes occur. Call your caregiver if you have any problems or questions after your procedure. HOME CARE INSTRUCTIONS You may shower 24 hours after the procedure. Remove the bandage (dressing) and gently wash the site with plain soap and water. Gently pat the site dry.  Do not apply powder or lotion to the site.  Do not sit in a bathtub, swimming pool, or whirlpool for 5 to 7 days.  No bending, squatting, or lifting anything over 10 pounds (4.5 kg) for the 1 1/2 weeks.  Inspect the site at least twice daily.  Do not drive home if you are discharged the same day of the procedure. Have someone else drive you.  You may drive 24 hours after the procedure unless otherwise instructed by your caregiver.  What to expect: Any bruising will usually fade within 1 to 2 weeks.  Blood that collects in the tissue (hematoma) may be painful to the touch. It should usually decrease in size and tenderness within 1 to 2 weeks.  SEEK IMMEDIATE MEDICAL CARE IF: You have unusual pain at the groin site or down the affected leg.  You have redness, warmth, swelling, or pain at the groin site.  You have drainage (other than a small amount of blood on the dressing).  You have chills.  You have a fever or persistent symptoms for more  than 72 hours.  You have a fever and your symptoms suddenly get worse.  Your leg becomes pale, cool, tingly, or numb.  You have heavy bleeding from the site. Hold pressure on the site. .   Increase activity slowly    Complete by:  As directed       Discharge Medications   Current Discharge Medication List    START taking these medications   Details  amLODipine (NORVASC) 5 MG tablet Take 1 tablet (5 mg total) by mouth daily. Qty: 30 tablet, Refills: 6    aspirin 81 MG chewable tablet Chew 1 tablet (81 mg total) by mouth daily.    atorvastatin (LIPITOR) 80 MG tablet Take 1 tablet (80 mg total) by mouth daily at 6 PM. Qty: 30 tablet, Refills: 6    carvedilol (COREG) 3.125 MG tablet  Take 1 tablet (3.125 mg total) by mouth 2 (two) times daily with a meal. Qty: 60 tablet, Refills: 6    lisinopril (PRINIVIL,ZESTRIL) 5 MG tablet Take 1 tablet (5 mg total) by mouth daily. Qty: 30 tablet, Refills: 6    nitroGLYCERIN (NITROSTAT) 0.4 MG SL tablet Place 1 tablet (0.4 mg total) under the tongue every 5 (five) minutes x 3 doses as needed for chest pain. Qty: 25 tablet, Refills: 3      CONTINUE these medications which have NOT CHANGED   Details  ALPRAZolam (XANAX) 0.5 MG tablet Take 1 tablet (0.5 mg total) by mouth at bedtime as needed for anxiety. Qty: 12 tablet, Refills: 0      STOP taking these medications     azithromycin (ZITHROMAX Z-PAK) 250 MG tablet          Aspirin prescribed at discharge?  Yes High Intensity Statin Prescribed? (Lipitor 40-80mg  or Crestor 20-40mg ): Yes Beta Blocker Prescribed? Yes For EF <40%, was ACEI/ARB Prescribed? Yes ADP Receptor Inhibitor Prescribed? (i.e. Plavix etc.-Includes Medically Managed Patients): No: Significant bleeding, requiring blood transfusion. For EF <40%, Aldosterone Inhibitor Prescribed? No, EF ok. Was EF assessed during THIS hospitalization? Yes Was Cardiac Rehab II ordered? (Included Medically managed Patients): Yes     Outstanding Labs/Studies   CBC, BMET at follow up visit. FLP and LFTs in 6-8 weeks if able to tolerate statin.   Duration of Discharge Encounter   Greater than 30 minutes including physician time.  Signed, Laverda PageLindsay Roberts NP-C 08/10/2016, 10:59 AM

## 2016-08-10 NOTE — Progress Notes (Signed)
SUBJECTIVE: No significant chest pain. No dyspnea. Right leg is sore.   Tele: sinus  BP 133/78 (BP Location: Right Arm)   Pulse 78   Temp 98.2 F (36.8 C) (Oral)   Resp (!) 22   Ht 5\' 2"  (1.575 m)   Wt 190 lb (86.2 kg)   SpO2 92%   BMI 34.75 kg/m   Intake/Output Summary (Last 24 hours) at 08/10/16 0726 Last data filed at 08/09/16 1500  Gross per 24 hour  Intake                0 ml  Output              450 ml  Net             -450 ml    PHYSICAL EXAM General: Well developed, well nourished, in no acute distress. Alert and oriented x 3.  Psych:  Good affect, responds appropriately Neck: No JVD. No masses noted.  Lungs: Clear bilaterally with no wheezes or rhonci noted.  Heart: RRR with no murmurs noted. Abdomen: Bowel sounds are present. Soft, non-tender.  Extremities: No lower extremity edema. Right groin is soft with large ecchymosis.    LABS: Basic Metabolic Panel:  Recent Labs  16/06/9610/11/17 0225 08/09/16 0404  NA 139 140  K 4.3 3.9  CL 110 110  CO2 23 24  GLUCOSE 108* 92  BUN 17 13  CREATININE 0.74 0.72  CALCIUM 8.3* 8.5*   CBC:  Recent Labs  08/09/16 0404 08/10/16 0202  WBC 9.5 11.9*  HGB 9.3* 9.6*  HCT 28.7* 28.9*  MCV 91.7 92.0  PLT 226 262   Current Meds: . amLODipine  5 mg Oral Daily  . aspirin  81 mg Oral Daily  . atorvastatin  80 mg Oral q1800  . carvedilol  3.125 mg Oral BID WC  . insulin aspart  0-15 Units Subcutaneous TID WC  . lisinopril  5 mg Oral Daily  . sodium chloride flush  3 mL Intravenous Q12H   ASSESSMENT AND PLAN: 55 y/o femalewith a h/o bipolar d/o, anxiety, HTN, HLD, tobacco abuse, crack cocaine abuse, and obesity who presented 08/06/16 with chest pain and neg trop in the setting of crack cocaine abuse. Cath revealed occluded RCA with moderate left system dzs med rx recommended. Post-cath 11/10, developed large right groin hematoma with drop in H/H.   1. Unstable Angina/CAD: Presented with chest pain in the  setting of crack cocaine. Cardiac cath on 08/07/16 showed CTO of the RCA with moderate, diffuse LAD disease. Med Rx was recommended. She has not had recurrence of chest pain. LVEF=55-60% by echo. Plavix started post-cath, held given hematoma and absence of ACS/stenting. Will plan to continue ASA, statin, beta blocker and Ace-inh.   2. R Groin Hematoma/Acute blood loss anemia: Post-cath pt c/o R groin pain. CT early morning 11/11 confirmed large hematoma with evidence of active bleeding. H/H down to 7.9/24.9 and was given 1 unit pRBCs. H/H 9.6/28.9 this AM. Hemodynamically stable. No bruit. Good distal pulse. Area remains very ecchymotic but soft. Repeat CT 08/09/16 with improving large right groin hematoma.   3. HTN: BP stable.    4. HLD: Statin started.  5. Tob Abuse/Crack Cocaine Abuse: Cessation advised.   6.  Developing AAA:  Noted on CT 11/11 - 4 cm.  Will need f/u.  Discharge home today. [facility] 1 week.   Verne Carrowhristopher Dreyden Rohrman  11/13/20177:26 AM

## 2016-08-11 ENCOUNTER — Encounter: Payer: Self-pay | Admitting: Cardiovascular Disease

## 2016-08-11 ENCOUNTER — Other Ambulatory Visit: Payer: Self-pay | Admitting: Cardiology

## 2016-08-11 DIAGNOSIS — I6523 Occlusion and stenosis of bilateral carotid arteries: Secondary | ICD-10-CM

## 2016-08-11 NOTE — Progress Notes (Signed)
Hospital records reviewed. I was involved in the patient's care at the time of her [facility]. Carotid duplex ordered for hx of known carotid stenosis with chronically occluded LICA and loud bruit on the right. Duplex demonstrated >80% RICA stenosis. No neurologic sx's.   Recommend outpatient vascular surgery evaluation. Please arrange. Thanks  Tonny BollmanCooper, Akeria Hedstrom 08/11/2016 6:12 AM

## 2016-08-12 ENCOUNTER — Encounter (HOSPITAL_COMMUNITY): Payer: Self-pay

## 2016-08-12 ENCOUNTER — Encounter: Payer: Self-pay | Admitting: Vascular Surgery

## 2016-08-18 ENCOUNTER — Encounter: Payer: Self-pay | Admitting: Cardiology

## 2016-08-18 DIAGNOSIS — I255 Ischemic cardiomyopathy: Secondary | ICD-10-CM | POA: Insufficient documentation

## 2016-08-18 DIAGNOSIS — I739 Peripheral vascular disease, unspecified: Secondary | ICD-10-CM

## 2016-08-18 DIAGNOSIS — I779 Disorder of arteries and arterioles, unspecified: Secondary | ICD-10-CM | POA: Insufficient documentation

## 2016-08-19 ENCOUNTER — Encounter: Payer: Self-pay | Admitting: Cardiology

## 2016-08-19 ENCOUNTER — Ambulatory Visit (INDEPENDENT_AMBULATORY_CARE_PROVIDER_SITE_OTHER): Payer: Self-pay | Admitting: Cardiology

## 2016-08-19 VITALS — BP 180/100 | HR 97 | Ht 62.0 in | Wt 174.0 lb

## 2016-08-19 DIAGNOSIS — T148XXA Other injury of unspecified body region, initial encounter: Secondary | ICD-10-CM

## 2016-08-19 DIAGNOSIS — I739 Peripheral vascular disease, unspecified: Principal | ICD-10-CM

## 2016-08-19 DIAGNOSIS — I10 Essential (primary) hypertension: Secondary | ICD-10-CM

## 2016-08-19 DIAGNOSIS — I25110 Atherosclerotic heart disease of native coronary artery with unstable angina pectoris: Secondary | ICD-10-CM

## 2016-08-19 DIAGNOSIS — I779 Disorder of arteries and arterioles, unspecified: Secondary | ICD-10-CM

## 2016-08-19 MED ORDER — TRAMADOL HCL 50 MG PO TABS: 50.0000 mg | ORAL_TABLET | Freq: Four times a day (QID) | ORAL | 0 refills | Status: DC | PRN

## 2016-08-19 MED ORDER — NITROGLYCERIN 0.4 MG SL SUBL: 0.4000 mg | SUBLINGUAL_TABLET | SUBLINGUAL | 3 refills | Status: DC | PRN

## 2016-08-19 MED ORDER — LISINOPRIL 5 MG PO TABS: 5.0000 mg | ORAL_TABLET | Freq: Every day | ORAL | 3 refills | Status: DC

## 2016-08-19 MED ORDER — ATORVASTATIN CALCIUM 80 MG PO TABS: 80.0000 mg | ORAL_TABLET | Freq: Every day | ORAL | 3 refills | Status: DC

## 2016-08-19 MED ORDER — AMLODIPINE BESYLATE 5 MG PO TABS: 5.0000 mg | ORAL_TABLET | Freq: Every day | ORAL | 3 refills | Status: DC

## 2016-08-19 MED ORDER — METOPROLOL TARTRATE 25 MG PO TABS: 25.0000 mg | ORAL_TABLET | Freq: Two times a day (BID) | ORAL | 3 refills | Status: DC

## 2016-08-19 NOTE — Assessment & Plan Note (Signed)
Large RFA  Hematoma, required transfusion, following diagnostic cath 08/07/16

## 2016-08-19 NOTE — Assessment & Plan Note (Signed)
[facility] [place] and uncontrolled HTN- h/o recent crack cocaine use. Troponin's were negative

## 2016-08-19 NOTE — Assessment & Plan Note (Signed)
3 prior caths '06, '09, and 08/07/16- medical Rx of CTO RCA and 40-70% diffuse LAD disease Medical Rx was the plan but the pt tells me she "was sent home with no medications or prescriptions"

## 2016-08-19 NOTE — Assessment & Plan Note (Signed)
B/P uncontrolled- she is taking no medications

## 2016-08-19 NOTE — Assessment & Plan Note (Signed)
Known LICA occluded -80% RICA by doppler, s/p RCEA 2006

## 2016-08-19 NOTE — Progress Notes (Signed)
08/19/2016 [name] G Cedar   07/17/1961  [id number]  Primary Physician No PCP Per Patient Primary Cardiologist: Dr Diona BrownerMcDowell  HPI:  55 year old Caucasian female with a past medical history of anxiety/depression, polysubstance abuse, and uncontrolled HTN, s/p multiple cath, and PVD-s/p RCEA 2006 with known occluded LICA. She presented to [facility] on 08/05/16 with intermittent chest pain in the setting of uncontrolled hypertension and recent crack cocaine use. Her Troponin's were negative. Cath was done 08/07/16 which showed scatted disease as noted below. Plan is for medical Rx. EF was 55-60% by echo. Post cath the pt developed a large RFA hematoma. She required a blood transfusion secondary to this. Also this admission a carotid bruit was noted and dopplers done. 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 The pt was discharged 08/10/16 and is in the office today for follow up. She tells me she received no prescriptions and there were none called in for [place] in [place]. She is only taking ASA. Her main complaint is pain Rt groin.    Current Outpatient Prescriptions  Medication Sig Dispense Refill  . aspirin 81 MG chewable tablet Chew 1 tablet (81 mg total) by mouth daily.    Marland Kitchen. ALPRAZolam (XANAX) 0.5 MG tablet Take 1 tablet (0.5 mg total) by mouth at bedtime as needed for anxiety. (Patient not taking: Reported on 08/19/2016) 12 tablet 0  . amLODipine (NORVASC) 5 MG tablet Take 1 tablet (5 mg total) by mouth daily. 90 tablet 3  . atorvastatin (LIPITOR) 80 MG tablet Take 1 tablet (80 mg total) by mouth daily at 6 PM. 90 tablet 3  . lisinopril (PRINIVIL,ZESTRIL) 5 MG tablet Take 1 tablet (5 mg total) by mouth daily. 90 tablet 3  . metoprolol tartrate (LOPRESSOR) 25 MG tablet Take 1 tablet (25 mg total) by mouth 2 (two) times daily. 180 tablet 3  . nitroGLYCERIN (NITROSTAT) 0.4 MG SL tablet Place 1 tablet (0.4 mg total) under the tongue every 5 (five) minutes x 3 doses as needed  for chest pain. 25 tablet 3  . traMADol (ULTRAM) 50 MG tablet Take 1 tablet (50 mg total) by mouth every 6 (six) hours as needed. 20 tablet 0   No current facility-administered medications for this visit.     Allergies  Allergen Reactions  . Penicillins Swelling and Other (See Comments)    Muscle rigidness    Social History   Social History  . Marital status: Single    Spouse name: N/A  . Number of children: N/A  . Years of education: N/A   Occupational History  . Not on file.   Social History Main Topics  . Smoking status: Current Every Day Smoker    Packs/day: 1.00    Types: Cigarettes  . Smokeless tobacco: Current User  . Alcohol use No  . Drug use:     Types: Marijuana  . Sexual activity: Not Currently   Other Topics Concern  . Not on file   Social History Narrative  . No narrative on file     Review of Systems: General: negative for chills, fever, night sweats or weight changes.  Cardiovascular: negative for edema, orthopnea, palpitations, paroxysmal nocturnal dyspnea Dermatological: negative for rash Respiratory: negative for cough or wheezing Urologic: negative for hematuria Abdominal: negative for nausea, vomiting, diarrhea, bright red blood per rectum, melena, or hematemesis Neurologic: negative for visual changes, syncope, or dizziness All other systems reviewed and are otherwise negative except as noted above.  Blood pressure (!) 180/100, pulse 97, height 5\' 2"  (1.575 m), weight 174 lb (78.9 kg), SpO2 98 %.  General appearance: alert, cooperative, appears older than stated age and no distress Neck: no JVD and RCEA scar and bruit Lungs: clear to auscultation bilaterally Heart: regular rate and rhythm Extremities: Rt FA site with grapefruit sized heamtoma- very tender, warm to touch but no sign of cellulitis, no bruit.  Pulses: 2+ and symmetric Skin: Skin color, texture, turgor normal. No rashes or lesions Neurologic: Grossly  normal   ASSESSMENT AND PLAN:   Unstable angina pectoris (HCC) [facility]16 [place] and uncontrolled HTN- h/o recent crack cocaine use. Troponin's were negative  CAD (coronary artery disease) 3 prior caths '06, '09, and 08/07/16- medical Rx of CTO RCA and 40-70% diffuse LAD disease Medical Rx was the plan but the pt tells me she "was sent home with no medications or prescriptions"  Essential hypertension B/P uncontrolled- she is taking no medications  Carotid disease, bilateral (HCC) Known LICA occluded -80% RICA by doppler, s/p RCEA 2006  Right Groin Hematoma  Large RFA  Hematoma, required transfusion, following diagnostic cath 08/07/16   PLAN  I asked her if she wasn't given information about [facility], she admitted she was and that she called but wasn't given an appointment. I gave her written scripts for  Her medications (I did not give her an Rx for Xanax but did give her an Rx for #20 Ultram. I also changed Coreg to Lopressor secondary to cost). She will need a referral to vascular surgeon for her carotid disease.  She will need close monitoring of her Rt groin hematoma and should be seen next week for this.   Corine ShelterLuke Geovana Gebel PA-C 08/19/2016 2:53 PM

## 2016-08-19 NOTE — Patient Instructions (Signed)
Medication Instructions:  Start Amlodipine 5 mg daily Start Lipitor 80 mg once daily  Start metoprolol 25 mg two times daily Tramadol 50 mg - take 1 tablet every 6 hours as needed for pain Take lisinopril 5 mg once daily  Nitro 0.4 mg tablet   Labwork: none  Testing/Procedures: none  Follow-Up: Your physician recommends that you schedule a follow-up appointment in: 1 week    Any Other Special Instructions Will Be Listed Below (If Applicable). You have been referred to [facility], someone from their office will contact you soon .    If you need a refill on your cardiac medications before your next appointment, please call your pharmacy.

## 2016-08-25 ENCOUNTER — Emergency Department (HOSPITAL_COMMUNITY): Payer: Self-pay

## 2016-08-25 ENCOUNTER — Encounter (HOSPITAL_COMMUNITY): Payer: Self-pay | Admitting: Emergency Medicine

## 2016-08-25 ENCOUNTER — Telehealth: Payer: Self-pay

## 2016-08-25 ENCOUNTER — Emergency Department (HOSPITAL_COMMUNITY)
Admission: EM | Admit: 2016-08-25 | Discharge: 2016-08-25 | Disposition: A | Discharge status: Home / Self Care | Payer: Self-pay | Attending: Emergency Medicine | Admitting: Emergency Medicine

## 2016-08-25 ENCOUNTER — Telehealth: Payer: Self-pay | Admitting: *Deleted

## 2016-08-25 DIAGNOSIS — Y999 Unspecified external cause status: Secondary | ICD-10-CM | POA: Insufficient documentation

## 2016-08-25 DIAGNOSIS — S301XXA Contusion of abdominal wall, initial encounter: Secondary | ICD-10-CM | POA: Insufficient documentation

## 2016-08-25 DIAGNOSIS — I714 Abdominal aortic aneurysm, without rupture, unspecified: Secondary | ICD-10-CM | POA: Insufficient documentation

## 2016-08-25 DIAGNOSIS — I2510 Atherosclerotic heart disease of native coronary artery without angina pectoris: Secondary | ICD-10-CM | POA: Insufficient documentation

## 2016-08-25 DIAGNOSIS — Y939 Activity, unspecified: Secondary | ICD-10-CM | POA: Insufficient documentation

## 2016-08-25 DIAGNOSIS — R079 Chest pain, unspecified: Secondary | ICD-10-CM

## 2016-08-25 DIAGNOSIS — R1031 Right lower quadrant pain: Secondary | ICD-10-CM

## 2016-08-25 DIAGNOSIS — I10 Essential (primary) hypertension: Secondary | ICD-10-CM | POA: Insufficient documentation

## 2016-08-25 DIAGNOSIS — R51 Headache: Secondary | ICD-10-CM | POA: Insufficient documentation

## 2016-08-25 DIAGNOSIS — Z7982 Long term (current) use of aspirin: Secondary | ICD-10-CM | POA: Insufficient documentation

## 2016-08-25 DIAGNOSIS — F17210 Nicotine dependence, cigarettes, uncomplicated: Secondary | ICD-10-CM | POA: Insufficient documentation

## 2016-08-25 DIAGNOSIS — X58XXXA Exposure to other specified factors, initial encounter: Secondary | ICD-10-CM | POA: Insufficient documentation

## 2016-08-25 DIAGNOSIS — Y929 Unspecified place or not applicable: Secondary | ICD-10-CM | POA: Insufficient documentation

## 2016-08-25 HISTORY — DX: Abdominal aortic aneurysm, without rupture: I71.4

## 2016-08-25 HISTORY — DX: Abdominal aortic aneurysm, without rupture, unspecified: I71.40

## 2016-08-25 LAB — URINALYSIS, ROUTINE W REFLEX MICROSCOPIC
BILIRUBIN URINE: NEGATIVE
GLUCOSE, UA: NEGATIVE mg/dL
HGB URINE DIPSTICK: NEGATIVE
KETONES UR: NEGATIVE mg/dL
Leukocytes, UA: NEGATIVE
Nitrite: NEGATIVE
PH: 6 (ref 5.0–8.0)
Protein, ur: 100 mg/dL — AB
Specific Gravity, Urine: 1.025 (ref 1.005–1.030)

## 2016-08-25 LAB — URINE MICROSCOPIC-ADD ON: RBC / HPF: NONE SEEN RBC/hpf (ref 0–5)

## 2016-08-25 LAB — BASIC METABOLIC PANEL
Anion gap: 9 (ref 5–15)
BUN: 15 mg/dL (ref 6–20)
CHLORIDE: 105 mmol/L (ref 101–111)
CO2: 22 mmol/L (ref 22–32)
CREATININE: 0.66 mg/dL (ref 0.44–1.00)
Calcium: 9.6 mg/dL (ref 8.9–10.3)
GFR calc Af Amer: >60 mL/min (ref >60)
GFR calc non Af Amer: >60 mL/min (ref >60)
GLUCOSE: 107 mg/dL — AB (ref 65–99)
Potassium: 3.6 mmol/L (ref 3.5–5.1)
SODIUM: 136 mmol/L (ref 135–145)

## 2016-08-25 LAB — I-STAT TROPONIN, ED: Troponin i, poc: 0.01 ng/mL (ref 0.00–0.08)

## 2016-08-25 LAB — CBC
HCT: 43 % (ref 36.0–46.0)
Hemoglobin: 14 g/dL (ref 12.0–15.0)
MCH: 31.3 pg (ref 26.0–34.0)
MCHC: 32.6 g/dL (ref 30.0–36.0)
MCV: 96 fL (ref 78.0–100.0)
PLATELETS: 446 10*3/uL — AB (ref 150–400)
RBC: 4.48 MIL/uL (ref 3.87–5.11)
RDW: 14.4 % (ref 11.5–15.5)
WBC: 8.1 10*3/uL (ref 4.0–10.5)

## 2016-08-25 LAB — TROPONIN I

## 2016-08-25 MED ORDER — IOPAMIDOL (ISOVUE-370) INJECTION 76%
100.0000 mL | Freq: Once | INTRAVENOUS | Status: AC | PRN
  Administered 2016-08-25: 100 mL via INTRAVENOUS

## 2016-08-25 MED ORDER — METOPROLOL TARTRATE 5 MG/5ML IV SOLN
5.0000 mg | Freq: Once | INTRAVENOUS | Status: AC
  Administered 2016-08-25: 5 mg via INTRAVENOUS
  Filled 2016-08-25: qty 5

## 2016-08-25 MED ORDER — SODIUM CHLORIDE 0.9 % IV BOLUS (SEPSIS)
500.0000 mL | Freq: Once | INTRAVENOUS | Status: AC
  Administered 2016-08-25: 500 mL via INTRAVENOUS

## 2016-08-25 MED ORDER — FENTANYL CITRATE (PF) 100 MCG/2ML IJ SOLN
50.0000 ug | INTRAMUSCULAR | Status: DC | PRN
  Administered 2016-08-25: 50 ug via INTRAVENOUS
  Filled 2016-08-25: qty 2

## 2016-08-25 MED ORDER — HYDROCODONE-ACETAMINOPHEN 5-325 MG PO TABS
2.0000 | ORAL_TABLET | Freq: Once | ORAL | Status: AC
  Administered 2016-08-25: 2 via ORAL
  Filled 2016-08-25: qty 2

## 2016-08-25 NOTE — Congregational Nurse Program (Signed)
Congregational Nurse Program Note  Date of Encounter: 08/25/2016  Past Medical History: Past Medical History:  Diagnosis Date  . Anxiety   . Bipolar 1 disorder (HCC)   . CAD (coronary artery disease)   . CAD (coronary artery disease)    a. 08/08/16 LAD 70%, 60% 1st diag, CTO RCA Med Rx EF 55%  . Crack cocaine use   . Depression   . Hypertension   . Right Groin Hematoma    a. 07/2016 following diagnostic cath  . Tobacco abuse   . Unstable angina pectoris (HCC) 08/06/2016    Encounter Details:     CNP Questionnaire - 08/25/16 1600      Patient Demographics   Is this a new or existing patient? New   Patient is considered a/an Not Applicable   Race Caucasian/White     Patient Assistance   Location of Patient Assistance [facility]   Patient's financial/insurance status Low Income;Self-Pay (Uninsured)   Uninsured Patient ([place] [employer]s) Yes   Interventions Counseled to make appt. with provider;Assisted patient in making appt.;Averted from ED/Urgent Care   Patient referred to apply for the following financial assistance [facility]   Food insecurities addressed Not Applicable   Transportation assistance No   Assistance securing medications Yes   Type of Assistance [facility]   Educational health offerings Chronic disease;Navigating the healthcare system;Medications;Hypertension     Encounter Details   Primary purpose of visit Chronic Illness/Condition Visit;Post ED/Hospitalization Visit;Navigating the Healthcare System   Was an Emergency Department visit averted? Yes   Does patient have a medical provider? No   Patient referred to Establish PCP;Clinic   Was a mental health screening completed? (GAINS tool) No   Does patient have dental issues? No   Does patient have vision issues? Yes   Does your patient have an abnormal blood pressure today? Yes   Since previous encounter, have you referred patient for abnormal blood pressure that  resulted in a new diagnosis or medication change? Yes   Does your patient have an abnormal blood glucose today? No   Since previous encounter, have you referred patient for abnormal blood glucose that resulted in a new diagnosis or medication change? No   Was there a life-saving intervention made? No    Addendum to previous note:  RN called to verify that client had [facility]. The friend that had driven answered the phone and stated that [name] did get her medications and started them and was feeling worse with increased pain in her right lower abdomen with nausea so she asked friend to take her [facility]. RN called [facility] in [place] to inform them that client had [facility]. Will follow up with client 08/26/16.

## 2016-08-25 NOTE — ED Provider Notes (Signed)
[facility]Y DEPT Provider Note   CSN: [id number] Arrival date & time: 08/25/16  1608     History   Chief Complaint Chief Complaint  Patient presents with  . Hypertension  . Chest Pain    HPI [name] is a 55 y.o. female.  Patient with cocaine abuse, bipolar, coronary artery disease, carotid arterial disease, angina, depression presents with multiple different symptoms. Patient's had uncontrolled high blood pressure and has been noncompliant taking medications since she was discharged from having a cardiac cath approximately 2 weeks ago. Patient now has her medications and started taking today. Patient's had right groin pain and swelling since the procedure. Patient also complains of upper abdominal pain/lower chest pain fairly constant for the past few days. No exertional component. Mild worse with movement. Patient abuses crack cocaine, denies recent use. Patient had low-grade fever 2 days prior.      Past Medical History:  Diagnosis Date  . Anxiety   . Bipolar 1 disorder (HCC)   . CAD (coronary artery disease)   . CAD (coronary artery disease)    a. 08/08/16 LAD 70%, 60% 1st diag, CTO RCA Med Rx EF 55%  . Crack cocaine use   . Depression   . Hypertension   . Right Groin Hematoma    a. 07/2016 following diagnostic cath  . Tobacco abuse   . Unstable angina pectoris (HCC) 08/06/2016    Patient Active Problem List   Diagnosis Date Noted  . Carotid disease, bilateral (HCC) 08/18/2016  . Cardiomyopathy, ischemic 08/18/2016  . CAD (coronary artery disease) 08/08/2016  . Acute blood loss anemia 08/08/2016  . Dyslipidemia 08/08/2016  . Bipolar 1 disorder (HCC)   . Essential hypertension   . Anxiety   . Crack cocaine use   . Tobacco abuse   . Right Groin Hematoma   . Chest pain 08/07/2016  . Unstable angina pectoris (HCC) 08/06/2016    Past Surgical History:  Procedure Laterality Date  . artery cleaned    . CARDIAC CATHETERIZATION N/A 08/07/2016   Procedure: Left Heart Cath and Coronary Angiography;  Surgeon: Lennette Bihari, MD;  Location: [facility] INVASIVE CV LAB;  Service: Cardiovascular;  Laterality: N/A;  . TUBAL LIGATION      OB History    Gravida Para Term Preterm AB Living   4 4 3 1        SAB TAB Ectopic Multiple Live Births                   Home Medications    Prior to Admission medications   Medication Sig Start Date End Date Taking? Authorizing Provider  amLODipine (NORVASC) 5 MG tablet Take 1 tablet (5 mg total) by mouth daily. 08/19/16  Yes Abelino Derrick, PA-C  aspirin 81 MG chewable tablet Chew 1 tablet (81 mg total) by mouth daily. Patient taking differently: Chew 162 mg by mouth daily.  08/11/16  Yes Arty Baumgartner, NP  lisinopril (PRINIVIL,ZESTRIL) 5 MG tablet Take 1 tablet (5 mg total) by mouth daily. 08/19/16  Yes Luke K Kilroy, PA-C  metoprolol tartrate (LOPRESSOR) 25 MG tablet Take 1 tablet (25 mg total) by mouth 2 (two) times daily. 08/19/16 11/17/16 Yes Luke K Kilroy, PA-C  nitroGLYCERIN (NITROSTAT) 0.4 MG SL tablet Place 1 tablet (0.4 mg total) under the tongue every 5 (five) minutes x 3 doses as needed for chest pain. 08/19/16  Yes Luke K Kilroy, PA-C  ALPRAZolam Prudy Feeler) 0.5 MG tablet Take 1 tablet (0.5 mg total)  by mouth at bedtime as needed for anxiety. Patient not taking: Reported on 08/19/2016 10/21/13   Brandt LoosenJulie Manly, MD  atorvastatin (LIPITOR) 80 MG tablet Take 1 tablet (80 mg total) by mouth daily at 6 PM. 08/19/16   Abelino DerrickLuke K Kilroy, PA-C  traMADol (ULTRAM) 50 MG tablet Take 1 tablet (50 mg total) by mouth every 6 (six) hours as needed. 08/19/16   Abelino DerrickLuke K Kilroy, PA-C    Family History Family History  Problem Relation Age of Onset  . Heart attack Mother   . Heart attack Father     Social History Social History  Substance Use Topics  . Smoking status: Current Every Day Smoker    Packs/day: 1.00    Types: Cigarettes  . Smokeless tobacco: Current User  . Alcohol use No     Allergies     Penicillins   Review of Systems Review of Systems  Constitutional: Positive for fever. Negative for chills.  HENT: Negative for congestion.   Eyes: Negative for visual disturbance.  Respiratory: Negative for shortness of breath.   Cardiovascular: Positive for chest pain.  Gastrointestinal: Positive for abdominal pain. Negative for vomiting.  Genitourinary: Positive for flank pain. Negative for dysuria.  Musculoskeletal: Negative for back pain, neck pain and neck stiffness.  Skin: Negative for rash.  Neurological: Positive for headaches. Negative for light-headedness.     Physical Exam Updated Vital Signs BP 190/86 (BP Location: Left Arm)   Pulse 73   Temp 98.6 F (37 C) (Oral)   Resp 19   Ht 5\' 3"  (1.6 m)   Wt 174 lb (78.9 kg)   SpO2 98%   BMI 30.82 kg/m   Physical Exam  Constitutional: She is oriented to person, place, and time. She appears well-developed and well-nourished.  HENT:  Head: Normocephalic and atraumatic.  Eyes: Conjunctivae are normal. Right eye exhibits no discharge. Left eye exhibits no discharge.  Neck: Normal range of motion. Neck supple. No tracheal deviation present.  Cardiovascular: Normal rate, regular rhythm and intact distal pulses.   Pulmonary/Chest: Effort normal and breath sounds normal.  Abdominal: Soft. She exhibits no distension. There is tenderness (mild tenderness right groin and left upper quadrant no peritonitis). There is no guarding.  Musculoskeletal: She exhibits tenderness. She exhibits no edema.  Patient has partially 3 cm hematoma superficial mild tenderness right groin neurovascular intact distal legs.  Neurological: She is alert and oriented to person, place, and time. No cranial nerve deficit.  Skin: Skin is warm. No rash noted.  Psychiatric: She has a normal mood and affect.  Nursing note and vitals reviewed.    ED Treatments / Results  Labs (all labs ordered are listed, but only abnormal results are displayed) Labs  Reviewed  BASIC METABOLIC PANEL - Abnormal; Notable for the following:       Result Value   Glucose, Bld 107 (*)    All other components within normal limits  CBC - Abnormal; Notable for the following:    Platelets 446 (*)    All other components within normal limits  URINALYSIS, ROUTINE W REFLEX MICROSCOPIC (NOT AT [facility]) - Abnormal; Notable for the following:    Color, Urine AMBER (*)    Protein, ur 100 (*)    All other components within normal limits  URINE MICROSCOPIC-ADD ON - Abnormal; Notable for the following:    Squamous Epithelial / LPF 0-5 (*)    Bacteria, UA FEW (*)    Casts HYALINE CASTS (*)    All other  components within normal limits  TROPONIN I  I-STAT TROPOININ, ED    EKG  EKG Interpretation  Date/Time:  Tuesday August 25 2016 16:55:52 EST Ventricular Rate:  83 PR Interval:    QRS Duration: 98 QT Interval:  386 QTC Calculation: 454 R Axis:   44 Text Interpretation:  Sinus rhythm Prominent P waves, nondiagnostic Consider anterior infarct Confirmed by Taeler Winning MD, Jaleea Alesi [phone]) on 08/25/2016 5:29:42 PM       Radiology Dg Chest 2 View  Result Date: 08/25/2016 CLINICAL DATA:  55 y/o  F; several hours of central chest pressure. EXAM: CHEST  2 VIEW COMPARISON:  08/05/2016 chest radiograph FINDINGS: Stable cardiac silhouette within normal limits. Aortic atherosclerosis with arch calcification. Minor biapical scarring of the lungs is stable. No focal consolidation. No pneumothorax or effusion. Bones are unremarkable. IMPRESSION: No acute pulmonary process.  Aortic atherosclerosis. Electronically Signed   By: Mitzi Hansen M.D.   On: 08/25/2016 18:40   Ct Angio Chest/abd/pel For Dissection W And/or Wo Contrast  Result Date: 08/25/2016 CLINICAL DATA:  Abdominal pain.  Chest pain.  Right groin pain. EXAM: CT ANGIOGRAPHY CHEST, ABDOMEN AND PELVIS TECHNIQUE: Multidetector CT imaging through the chest, abdomen and pelvis was performed using the standard  protocol during bolus administration of intravenous contrast. Multiplanar reconstructed images and MIPs were obtained and reviewed to evaluate the vascular anatomy. CONTRAST:  100 cc Isovue 370 COMPARISON:  08/09/2016.  05/21/2011. FINDINGS: CTA CHEST FINDINGS Cardiovascular: There is no evidence of intramural hematoma on the noncontrast images. Maximal diameter of the ascending aorta is 3.9 cm. There is no evidence of dissection. Atherosclerotic and irregular plaque affects the upper ascending aorta as well as the aortic arch. The aortic arch is 3.3 cm in maximal diameter. There is irregular plaque throughout the course of the aortic arch and descending thoracic aorta. There is chronic appearing occlusion of the left common carotid artery. There are atherosclerotic changes of the innominate artery. There is web-like narrowing at the origin of the right subclavian artery which may be significant. Right common carotid artery is patent. There is also atherosclerotic changes involving the proximal left subclavian artery. 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 The right vertebral artery is dominant and opacifies. The left vertebral artery is non-opacified. Patency cannot be confirmed. No obvious acute pulmonary thromboembolism. The left ventricle is dilated. LAD territory and circumflex territory coronary artery calcifications are moderate. The left atrium is mildly dilated. Mediastinum/Nodes: No abnormal mediastinal adenopathy. Unremarkable thyroid gland. Lungs/Pleura: No pneumothorax or pleural effusion. Dependent atelectasis at the lung bases. Musculoskeletal: No vertebral compression deformity. Review of the MIP images confirms the above findings. CTA ABDOMEN AND PELVIS FINDINGS VASCULAR Aorta: There are atherosclerotic changes with irregular plaque throughout the abdominal aorta. An infrarenal abdominal aortic aneurysm is noted.  Maximal AP and transverse diameters are 3.8 cm and 4.1 cm. This is not significantly changed. Celiac: Moderate narrowing at the origin is likely a combination of atherosclerosis in median arcuate ligament syndrome. Branch vessels patent. Accessory left hepatic artery anatomy. SMA: The proximal SMA is patent. There is significant narrowing 5 cm beyond its origin which is estimated at 50%. It is associated with calcified and smooth atherosclerotic plaque. Renals: Single renal arteries are grossly patent bilaterally with atherosclerotic changes. IMA: IMA is diminutive and patent.  Branch vessels patent. Inflow: There is significant atherosclerotic change at the origin of the right common iliac artery. Significant narrowing may  be present. Scattered atherosclerotic changes of the right common iliac artery are noted. Right external iliac artery is patent. Atherosclerotic changes are present at the origin of the right internal iliac artery. There is significant narrowing in the proximal left common iliac artery. There are scattered atherosclerotic changes. Left external iliac artery is patent with mild distal atherosclerotic changes. Atherosclerotic changes are scattered in the left internal iliac artery. Veins: Portal vein is grossly patent. Renal veins are grossly patent. Other venous structures are non-opacified. Review of the MIP images confirms the above findings. NON-VASCULAR Hepatobiliary: Diffuse hepatic steatosis. Gallbladder is unremarkable. Pancreas: Unremarkable. Spleen: Unremarkable. Adrenals/Urinary Tract: Simple cyst in the upper pole of the left kidney. Right kidney is within normal limits. Stomach/Bowel: No evidence of small-bowel obstruction. No obvious mass in the colon. Diverticulosis of the sigmoid colon without acute diverticulitis. Lymphatic: No evidence of abnormal retroperitoneal adenopathy. Reproductive: Uterus is somewhat lobulated. Left ovarian cyst measures 3.1 cm. This is stable compared to  the prior study. Right ovary is unremarkable. Other: No free-fluid. Right anterior thigh hematoma is evolving. It measures 8.6 x 4.6 cm. Musculoskeletal: No vertebral compression deformity. There is advanced facet arthropathy at L4-5. Severe disc space narrowing at L5-S1 with posterior disc bulging. Review of the MIP images confirms the above findings. IMPRESSION: No evidence of intramural hematoma or aortic dissection. 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 The left vertebral artery does not opacified. Patency cannot be confirmed. There are atherosclerotic changes of the aortic arch and descending thoracic aorta without dissection or focal narrowing. Abdominal aortic aneurysm maximal diameter is 4.1 cm. Recommend followup by ultrasound in 1 year. This recommendation follows ACR consensus guidelines: White Paper of the ACR Incidental Findings Committee II on Vascular Findings. J Am Coll Radiol 2013; 10:789-794. Significant narrowing in the right and left common iliac arteries as described. Left ovarian cysts as described. Please follow recommendations on the prior report. Left anterior thigh hematoma is evolving. It is not significantly changed in size. Electronically Signed   By: Jolaine Click M.D.   On: 08/25/2016 20:21    Procedures Procedures (including critical care time)  Medications Ordered in ED Medications  fentaNYL (SUBLIMAZE) injection 50 mcg (50 mcg Intravenous Given 08/25/16 1849)  HYDROcodone-acetaminophen (NORCO/VICODIN) 5-325 MG per tablet 2 tablet (not administered)  metoprolol (LOPRESSOR) injection 5 mg (5 mg Intravenous Given 08/25/16 1848)  sodium chloride 0.9 % bolus 500 mL (0 mLs Intravenous Stopped 08/25/16 2009)  iopamidol (ISOVUE-370) 76 % injection 100 mL (100 mLs Intravenous Contrast Given 08/25/16 1941)  metoprolol  (LOPRESSOR) injection 5 mg (5 mg Intravenous Given 08/25/16 2009)     Initial Impression / Assessment and Plan / ED Course  I have reviewed the triage vital signs and the nursing notes.  Pertinent labs & imaging results that were available during my care of the patient were reviewed by me and considered in my medical decision making (see chart for details).  Clinical Course    Patient presents with persistent right groin pain and recent cardiac cath. No signs of infection externally however patient did have low-grade fever over the weekend. Patient also has flank pain left upper quadrant tenderness mild on exam. Patient's pain improved in the ER. Discussed with radiology and plan for CT angiogram to look for signs of dissection and/or hematoma and abscess. CT scan results reviewed mostly chronic vascular disease however patient  does have abdominal aortic aneurysm without rupture. Patient will need follow-up in 1 year for this and I will discuss this with her.  Patient's had uncontrolled high blood pressure and has been noncompliant blood pressure medications given to improve in the ER patient has prescription to start taking today.  Troponin negative plan for close follow-up with her cardiologist and primary doctor.  Results and differential diagnosis were discussed with the patient/parent/guardian. Xrays were independently reviewed by myself.  Close follow up outpatient was discussed, comfortable with the plan.   Medications  fentaNYL (SUBLIMAZE) injection 50 mcg (50 mcg Intravenous Given 08/25/16 1849)  metoprolol (LOPRESSOR) injection 5 mg (5 mg Intravenous Given 08/25/16 1848)  sodium chloride 0.9 % bolus 500 mL (0 mLs Intravenous Stopped 08/25/16 2009)  iopamidol (ISOVUE-370) 76 % injection 100 mL (100 mLs Intravenous Contrast Given 08/25/16 1941)  metoprolol (LOPRESSOR) injection 5 mg (5 mg Intravenous Given 08/25/16 2009)  HYDROcodone-acetaminophen (NORCO/VICODIN) 5-325 MG per  tablet 2 tablet (2 tablets Oral Given 08/25/16 2130)    Vitals:   08/25/16 1923 08/25/16 2000 08/25/16 2012 08/25/16 2100  BP: (!) 184/120 190/86 190/86 184/91  Pulse: 75 79 73 80  Resp: 16 23 19 18   Temp:      TempSrc:      SpO2: 99% 94% 98% 95%  Weight:      Height:        Final diagnoses:  AAA (abdominal aortic aneurysm) without rupture (HCC)  Right groin pain  Hematoma of groin, initial encounter  Left sided chest pain  Essential hypertension     Final Clinical Impressions(s) / ED Diagnoses   Final diagnoses:  AAA (abdominal aortic aneurysm) without rupture (HCC)  Right groin pain  Hematoma of groin, initial encounter  Left sided chest pain  Essential hypertension    New Prescriptions New Prescriptions   No medications on file     Blane OharaJoshua Sigourney Portillo, MD 08/25/16 2139

## 2016-08-25 NOTE — ED Notes (Signed)
Got pt warm blanket

## 2016-08-25 NOTE — Discharge Instructions (Signed)
Follow up with your cardiologist and primary doctor later this week. You need to have a repeat ultrasound of your abdominal aorta in 1 year. Please take your blood pressure medicines as directed.  If you were given medicines take as directed.  If you are on coumadin or contraceptives realize their levels and effectiveness is altered by many different medicines.  If you have any reaction (rash, tongues swelling, other) to the medicines stop taking and see a physician.    If your blood pressure was elevated in the ER make sure you follow up for management with a primary doctor or return for chest pain, shortness of breath or stroke symptoms.  Please follow up as directed and return to the ER or see a physician for new or worsening symptoms.  Thank you. Vitals:   08/25/16 1906 08/25/16 1923 08/25/16 2000 08/25/16 2012  BP: (!) 184/128 (!) 184/120 190/86 190/86  Pulse: 75 75 79 73  Resp: 15 16 23 19   Temp:      TempSrc:      SpO2: 100% 99% 94% 98%  Weight:      Height:

## 2016-08-25 NOTE — Telephone Encounter (Signed)
Patient being seen in ED.

## 2016-08-25 NOTE — ED Notes (Signed)
Pt went to [name] today. States had MI 08/07/16 and [facility]. Large hematoma to right femoral area. [name] was told her BP was extremely high. Then approx 20 mins later central chest started hurting. Has not taken any prescribed meds until today due to inability to pay

## 2016-08-25 NOTE — ED Notes (Signed)
Assist pt to bathroom 

## 2016-08-25 NOTE — Telephone Encounter (Signed)
Spoke with Elease HashimotoPatricia nurse with med assistance program. She reports that patient is in her office and has BP of 184/141 and HR 111 pt is c/o increased lower rt side ABD pain and " Just not feeling well today." Pt reports fever of 101 on Sat. And none yesterday or today. Nurse states that patient has good pulses and that hematoma is soft and  still within margins drawn. Patient has been unable to take medications due to cost but will be given 1 month supply of Lisinopril, Norvasc, Atorvastatin, Lopressor  today. Please advise. Will forward to DOD.

## 2016-08-25 NOTE — ED Notes (Signed)
Went to check to pt for vital Dr in with pt

## 2016-08-25 NOTE — ED Notes (Signed)
Nurse in with pt @this  time

## 2016-08-25 NOTE — Telephone Encounter (Signed)
Attempted to call client to follow up regarding receiving medications. Client did not answer , however friend "Debbie answered" who had driven client to her appointment today and states she dropped her off at [facility] because she was feeling worse , but she did pick [facility] her medications [facility] and she did take some of her medications. RN called [facility] Nurse Deanna ArtisKeisha to let her know that client did go [facility].

## 2016-08-25 NOTE — ED Triage Notes (Signed)
Pt reports hypertension and central chest pain that started today.

## 2016-08-25 NOTE — Congregational Nurse Program (Signed)
Congregational Nurse Program Note  Date of Encounter: 08/25/2016  Past Medical History: Past Medical History:  Diagnosis Date  . Anxiety   . Bipolar 1 disorder (HCC)   . CAD (coronary artery disease)   . CAD (coronary artery disease)    a. 08/08/16 LAD 70%, 60% 1st diag, CTO RCA Med Rx EF 55%  . Crack cocaine use   . Depression   . Hypertension   . Right Groin Hematoma    a. 07/2016 following diagnostic cath  . Tobacco abuse   . Unstable angina pectoris (HCC) 08/06/2016    Encounter Details:     CNP Questionnaire - 08/25/16 1500      Patient Demographics   Is this a new or existing patient? New   Patient is considered a/an Not Applicable   Race Caucasian/White     Patient Assistance   Location of Patient Assistance [facility]   Patient's financial/insurance status Low Income;Self-Pay (Uninsured)   Uninsured Patient ([place]/Care Connects) Yes   Interventions Counseled to make appt. with provider;Assisted patient in making appt.;Averted from ED/Urgent Care   Patient referred to apply for the following financial assistance [facility]   Food insecurities addressed Not Applicable   Transportation assistance No   Assistance securing medications Yes   Type of Assistance [facility]   Educational health offerings Chronic disease;Navigating the healthcare system;Medications;Hypertension     Encounter Details   Primary purpose of visit Chronic Illness/Condition Visit;Post ED/Hospitalization Visit;Navigating the Healthcare System   Was an Emergency Department visit averted? Yes   Does patient have a medical provider? No   Patient referred to Establish PCP;Clinic   Was a mental health screening completed? (GAINS tool) No   Does patient have dental issues? No   Does patient have vision issues? Yes   Does your patient have an abnormal blood pressure today? Yes   Since previous encounter, have you referred patient for abnormal blood pressure that  resulted in a new diagnosis or medication change? Yes   Does your patient have an abnormal blood glucose today? No   Since previous encounter, have you referred patient for abnormal blood glucose that resulted in a new diagnosis or medication change? No   Was there a life-saving intervention made? No     [facility] the [facility] program. Client came in today requesting assistance with referral to a primary care provider and assistance obtaining prescriptions that she currently cannot afford. Today client is complaining of generally "not feeling well". She [facility] [place] [place] [facility] for evaluation of chest pain. She was last [facility] [place] on 08/19/16 by Corine ShelterLuke Kilroy PA-C and was evaluated for post cath right groin hematoma and for follow up. Client states at that time she was given several blood pressure medications, cholesterol medicine and pain medication. Client brought prescriptions in today, stating she has not been able to afford them. RN reviewed medications with client, And are listed in EPIC .  Client is alert and oriented to person and place, but states she has trouble remembering things. Client complains of pain and tenderness in her right groin and right lower abdominal area. Right groin positive for hematoma, area is within marked margins and soft, but tender to touch. No bleeding noted and today client is afebrile. She has 2+ right pedal pulses. Client reports that she did experience fever on 08/22/16 evening and reported that it was  101.0. Denies shortness of breath or chest pains. She just reports a general feeling of "I don't feel right".  Vitals: 179/133, pulse 111 at 1330 in left arm. Temp 98.0 orally. O2 sats 99%. Recheck blood pressure at 1445 right arm 184/141, pulse 118, Left arm 184/136, pulse 111.  Discussed with client options for primary medical care without income or  medical insurance and client requests [facility] into [facility], referral made and appointment secured for 08/27/16 at 10:00 am. Contact information for the free clinic given to client.  Approved with Lisabeth PickLelia Moore RN to help with a 30 day fill of the following medications from [facility]. Amlodipine 5 mg 1 tablet orally once daily, Atorvastatin 80 mg one daily at 6PM, Lisinopril 5 mg one daily orally, Nitrostat 0.4mg  sublingual as prescribed as needed for chest pain, Metoprolol 25 mg one tablet twice daily.  Client aware that we are unable to help with her ultram prescription and states understanding.  RN called [facility] office and spoke with Nurse Deanna ArtisKeisha in regards to complaints of right lower abdominal pain, groin pain and general feeling of "not well" along with current blood pressure readings. Nurse Vita BarleyKiesha made aware that we were assisting client with medications listed above for a 30 day supply and that we had referred her into the Free Clinic and appointment was 08/27/16. Heart Care Nurse to send a note to Cardiologist for any further assessment of client and will call her back. Client has a follow up appointment at Cardiology on 09/12/16 . RN counseled client on not lifting anything over 5 pounds due to groin and also to seek emergency care for any bleeding from right groin or for signs of infection such as increased temperature , chills and increased pain. Client states understanding RN also discussed the need for smoking cessation and the effects of smoking on her blood pressure. Also discussed to go today to get medications and to begin taking them as prescribed by cardiology today. Client states she will do so. Client is relying on a friend today for transportation. RN will follow up with client on 08/26/16 and also 08/27/16 after primary care appointment. Educational materials regarding decreasing sodium in diet given and discussed along  with smoking cessation and ASK ME 3 given. Will follow as needed.

## 2016-08-25 NOTE — Telephone Encounter (Signed)
Patient seen recently by Mr. Lenn CalKilroy PA-C, I reviewed the note. She is status post recent cardiac catheterization in the setting of chest pain and cocaine abuse, no significant obstructive CAD documented. She did have a postprocedure hematoma, no obvious retroperitoneal bleed by CT imaging. She clearly needs to be on her medications to get her blood pressure and heart rate controlled, I am glad to hear that they were able to supply these since it sounds like she has had difficulty obtaining them. I would say that if she continues to have abdominal pain, she should probably be seen in the ER to have repeat abdominal imaging and make sure that the hematoma is not extending.

## 2016-08-26 ENCOUNTER — Telehealth: Payer: Self-pay

## 2016-08-26 NOTE — Telephone Encounter (Signed)
Called client to follow up after [facility] visit on 08/25/16. Called to verify that client did pick up medications at [facility] that [facility] with obtaining. Medications had been previously prescribed on 08/19/16 by Cardiology and client was unable to afford. Client states she did receive those and has began taking them. She also stated that she had went [facility] after getting her medications at the pharmacy because she had began to feel worse. She stated she was released late last night and was told " to keep taking my medications, but my leg had no infection."  RN reinforced the importance of taking her medications as prescribed and reminded client of her appointment [facility] [place] to establish a primary care provider. Client states understanding. RN will follow up with client after her appointment with the free clinic. Client agreeable.

## 2016-08-27 ENCOUNTER — Encounter: Payer: Self-pay | Admitting: Physician Assistant

## 2016-08-27 ENCOUNTER — Ambulatory Visit: Payer: Self-pay | Admitting: Physician Assistant

## 2016-08-27 VITALS — BP 168/90 | HR 82 | Temp 97.9°F | Ht 62.5 in | Wt 174.8 lb

## 2016-08-27 DIAGNOSIS — I25110 Atherosclerotic heart disease of native coronary artery with unstable angina pectoris: Secondary | ICD-10-CM

## 2016-08-27 DIAGNOSIS — Z131 Encounter for screening for diabetes mellitus: Secondary | ICD-10-CM

## 2016-08-27 DIAGNOSIS — E785 Hyperlipidemia, unspecified: Secondary | ICD-10-CM

## 2016-08-27 DIAGNOSIS — I714 Abdominal aortic aneurysm, without rupture, unspecified: Secondary | ICD-10-CM

## 2016-08-27 DIAGNOSIS — I779 Disorder of arteries and arterioles, unspecified: Secondary | ICD-10-CM

## 2016-08-27 DIAGNOSIS — Z6831 Body mass index (BMI) 31.0-31.9, adult: Secondary | ICD-10-CM

## 2016-08-27 DIAGNOSIS — E669 Obesity, unspecified: Secondary | ICD-10-CM

## 2016-08-27 DIAGNOSIS — I10 Essential (primary) hypertension: Secondary | ICD-10-CM

## 2016-08-27 DIAGNOSIS — K5900 Constipation, unspecified: Secondary | ICD-10-CM

## 2016-08-27 DIAGNOSIS — I739 Peripheral vascular disease, unspecified: Secondary | ICD-10-CM

## 2016-08-27 DIAGNOSIS — F1411 Cocaine abuse, in remission: Secondary | ICD-10-CM

## 2016-08-27 DIAGNOSIS — F17218 Nicotine dependence, cigarettes, with other nicotine-induced disorders: Secondary | ICD-10-CM

## 2016-08-27 DIAGNOSIS — T148XXA Other injury of unspecified body region, initial encounter: Secondary | ICD-10-CM

## 2016-08-27 DIAGNOSIS — Z1239 Encounter for other screening for malignant neoplasm of breast: Secondary | ICD-10-CM

## 2016-08-27 DIAGNOSIS — F39 Unspecified mood [affective] disorder: Secondary | ICD-10-CM

## 2016-08-27 LAB — GLUCOSE, POCT (MANUAL RESULT ENTRY): POC GLUCOSE: 109 mg/dL — AB (ref 70–99)

## 2016-08-27 MED ORDER — NITROGLYCERIN 0.4 MG SL SUBL: 0.4000 mg | SUBLINGUAL_TABLET | SUBLINGUAL | 3 refills | Status: DC | PRN

## 2016-08-27 MED ORDER — AMLODIPINE BESYLATE 5 MG PO TABS: 5.0000 mg | ORAL_TABLET | Freq: Every day | ORAL | 2 refills | Status: DC

## 2016-08-27 MED ORDER — METOPROLOL TARTRATE 50 MG PO TABS: 50.0000 mg | ORAL_TABLET | Freq: Two times a day (BID) | ORAL | 1 refills | Status: DC

## 2016-08-27 MED ORDER — ATORVASTATIN CALCIUM 80 MG PO TABS: 80.0000 mg | ORAL_TABLET | Freq: Every day | ORAL | 1 refills | Status: DC

## 2016-08-27 MED ORDER — LISINOPRIL 5 MG PO TABS: 5.0000 mg | ORAL_TABLET | Freq: Every day | ORAL | 1 refills | Status: DC

## 2016-08-27 NOTE — Patient Instructions (Addendum)
[facility]: Atorvastatin Lisinopril Metoprolol Nitroglycerin amlodipine     Constipation, Adult Constipation is when a person has fewer bowel movements in a week than normal, has difficulty having a bowel movement, or has stools that are dry, hard, or larger than normal. Constipation may be caused by an underlying condition. It may become worse with age if a person takes certain medicines and does not take in enough fluids. Follow these instructions at home: Eating and drinking  Eat foods that have a lot of fiber, such as fresh fruits and vegetables, whole grains, and beans.  Limit foods that are high in fat, low in fiber, or overly processed, such as french fries, hamburgers, cookies, candies, and soda.  Drink enough fluid to keep your urine clear or pale yellow. General instructions  Exercise regularly or as told by your health care provider.  Go to the restroom when you have the urge to go. Do not hold it in.  Take over-the-counter and prescription medicines only as told by your health care provider. These include any fiber supplements.  Practice pelvic floor retraining exercises, such as deep breathing while relaxing the lower abdomen and pelvic floor relaxation during bowel movements.  Watch your condition for any changes.  Keep all follow-up visits as told by your health care provider. This is important. Contact a health care provider if:  You have pain that gets worse.  You have a fever.  You do not have a bowel movement after 4 days.  You vomit.  You are not hungry.  You lose weight.  You are bleeding from the anus.  You have thin, pencil-like stools. Get help right away if:  You have a fever and your symptoms suddenly get worse.  You leak stool or have blood in your stool.  Your abdomen is bloated.  You have severe pain in your abdomen.  You feel dizzy or you faint. This information is not intended to replace advice given to you by your health care  provider. Make sure you discuss any questions you have with your health care provider. Document Released: 06/12/2004 Document Revised: 04/03/2016 Document Reviewed: 03/04/2016 Elsevier Interactive Patient Education  2017 [employer]

## 2016-08-27 NOTE — Progress Notes (Signed)
BP (!) 168/90 (BP Location: Left Arm, Patient Position: Sitting, Cuff Size: Normal)   Pulse 82   Temp 97.9 F (36.6 C)   Ht 5' 2.5" (1.588 m)   Wt 174 lb 12 oz (79.3 kg)   SpO2 99%   BMI 31.45 kg/m    Subjective:    Patient ID: [name], female    DOB: 03/09/1961, 55 y.o.   MRN: [id number]  HPI: [name] is a 55 y.o. female presenting on 08/27/2016 for New Patient (Initial Visit)   HPI   Pt had LHC recently and has f/u with cardiology next week.    She went to ER 2 days ago with CP.  She was diagnosed with AAA and told to f/u with cardiology  Pt doesn't know what meds she is taking.  ? Amlodipine.  bp high today. Discussed improtance of bring all meds to every appt.   Pt is not going anywhere for MH issues.  Pt was told about [facility] on Tuesday but she hasn't called yet.  Told pt about walk in services but she says she doesn't have an id.  She says she hasn't used cocaine since her heart attack earlier in November.   She is trying to quit smoking, only smoked 5 yesterday.   Never had mammogram.  Long long time since PAP, maybe age 55.  Relevant past medical, surgical, family and social history reviewed and updated as indicated. Interim medical history since our last visit reviewed. Allergies and medications reviewed and updated.   Current Outpatient Prescriptions:  .  aspirin 81 MG chewable tablet, Chew 1 tablet (81 mg total) by mouth daily. (Patient taking differently: Chew 162 mg by mouth daily. ), Disp: , Rfl:  .  atorvastatin (LIPITOR) 80 MG tablet, Take 1 tablet (80 mg total) by mouth daily at 6 PM., Disp: 90 tablet, Rfl: 3 .  lisinopril (PRINIVIL,ZESTRIL) 5 MG tablet, Take 1 tablet (5 mg total) by mouth daily., Disp: 90 tablet, Rfl: 3 .  metoprolol tartrate (LOPRESSOR) 25 MG tablet, Take 1 tablet (25 mg total) by mouth 2 (two) times daily., Disp: 180 tablet, Rfl: 3 .  nitroGLYCERIN (NITROSTAT) 0.4 MG SL tablet, Place 1 tablet (0.4 mg total) under the  tongue every 5 (five) minutes x 3 doses as needed for chest pain., Disp: 25 tablet, Rfl: 3 .  ALPRAZolam (XANAX) 0.5 MG tablet, Take 1 tablet (0.5 mg total) by mouth at bedtime as needed for anxiety. (Patient not taking: Reported on 08/27/2016), Disp: 12 tablet, Rfl: 0 .  amLODipine (NORVASC) 5 MG tablet, Take 1 tablet (5 mg total) by mouth daily., Disp: 90 tablet, Rfl: 3 .  traMADol (ULTRAM) 50 MG tablet, Take 1 tablet (50 mg total) by mouth every 6 (six) hours as needed. (Patient not taking: Reported on 08/27/2016), Disp: 20 tablet, Rfl: 0   Review of Systems  Constitutional: Positive for appetite change, diaphoresis and fatigue. Negative for chills, fever and unexpected weight change.  HENT: Negative for congestion, drooling, ear pain, facial swelling, hearing loss, mouth sores, sneezing, sore throat, trouble swallowing and voice change.   Eyes: Negative for pain, discharge, redness, itching and visual disturbance.  Respiratory: Positive for cough and shortness of breath. Negative for choking and wheezing.   Cardiovascular: Positive for chest pain and leg swelling. Negative for palpitations.  Gastrointestinal: Positive for abdominal pain and constipation. Negative for blood in stool, diarrhea and vomiting.  Endocrine: Negative for cold intolerance, heat intolerance and polydipsia.  Genitourinary:  Negative for decreased urine volume, dysuria and hematuria.  Musculoskeletal: Positive for arthralgias and gait problem. Negative for back pain.  Skin: Negative for rash.  Allergic/Immunologic: Negative for environmental allergies.  Neurological: Positive for headaches. Negative for seizures, syncope and light-headedness.  Hematological: Negative for adenopathy.  Psychiatric/Behavioral: Positive for agitation and dysphoric mood. Negative for suicidal ideas. The patient is nervous/anxious.     Per HPI unless specifically indicated above     Objective:    BP (!) 168/90 (BP Location: Left Arm,  Patient Position: Sitting, Cuff Size: Normal)   Pulse 82   Temp 97.9 F (36.6 C)   Ht 5' 2.5" (1.588 m)   Wt 174 lb 12 oz (79.3 kg)   SpO2 99%   BMI 31.45 kg/m   Wt Readings from Last 3 Encounters:  08/27/16 174 lb 12 oz (79.3 kg)  08/25/16 174 lb (78.9 kg)  08/25/16 171 lb 3.2 oz (77.7 kg)    Physical Exam  Constitutional: She is oriented to person, place, and time. She appears well-developed and well-nourished.  HENT:  Head: Normocephalic and atraumatic.  Mouth/Throat: Oropharynx is clear and moist. No oropharyngeal exudate.  Eyes: Conjunctivae and EOM are normal. Pupils are equal, round, and reactive to light.  Neck: Neck supple. No thyromegaly present.  Scar R neck (s/p endarterectomy)  Cardiovascular: Normal rate and regular rhythm.   Pulmonary/Chest: Effort normal and breath sounds normal.  Abdominal: Soft. Bowel sounds are normal. She exhibits no mass. There is no hepatosplenomegaly. There is no tenderness.  Musculoskeletal: She exhibits no edema.       Legs: hematoma  Lymphadenopathy:    She has no cervical adenopathy.  Neurological: She is alert and oriented to person, place, and time. Gait normal.  Skin: Skin is warm and dry.  Psychiatric: She has a normal mood and affect. Her behavior is normal.  Vitals reviewed.   Results for orders placed or performed in visit on 08/27/16  POCT Glucose (CBG)  Result Value Ref Range   POC Glucose 109 (A) 70 - 99 mg/dl      Assessment & Plan:   Encounter Diagnoses  Name Primary?  . Essential hypertension Yes  . AAA (abdominal aortic aneurysm) without rupture (HCC)   . Coronary artery disease involving native coronary artery of native heart with unstable angina pectoris (HCC)   . Carotid disease, bilateral (HCC)   . Screening for diabetes mellitus (DM)   . Screening for breast cancer   . Dyslipidemia   . Right Groin Hematoma   . Cigarette nicotine dependence with other nicotine-induced disorder   . Cocaine abuse in  remission   . Mood disorder (HCC)   . Constipation, unspecified constipation type   . Class 1 obesity with serious comorbidity and body mass index (BMI) of 31.0 to 31.9 in adult, unspecified obesity type     -Increase metoprolol to 50mg  today! -get pt signed up with [facility] to get her medications at no cost (ordered from [facility]:  Lipitor, lisinopril, metoprolol, NTG, amlodipine) -Gave sample metamucil and reading information on constipation -Counseled pt on importance of avoiding drugs with CAD -counsled smoking cessation -order screening Mammogram -No labs today -urged pt to Go to [facility] for MH issues -F/u US for AAA in 1 year -cardiology appointment next week as scheduled -F/u appointment in 1 month.  RTO sooner prn  (The duration of this appointment visit was 35 minutes of face-to-face time with the patient.  Greater than 50% of this time was spent in  counseling, explanation of diagnosis, planning of further management, and coordination of care.)

## 2016-09-01 ENCOUNTER — Other Ambulatory Visit: Payer: Self-pay | Admitting: Physician Assistant

## 2016-09-01 DIAGNOSIS — Z1231 Encounter for screening mammogram for malignant neoplasm of breast: Secondary | ICD-10-CM

## 2016-09-02 ENCOUNTER — Ambulatory Visit: Payer: Self-pay | Admitting: Physician Assistant

## 2016-09-10 ENCOUNTER — Encounter (HOSPITAL_COMMUNITY): Payer: Self-pay

## 2016-09-16 ENCOUNTER — Ambulatory Visit (INDEPENDENT_AMBULATORY_CARE_PROVIDER_SITE_OTHER): Payer: Self-pay | Admitting: Physician Assistant

## 2016-09-16 ENCOUNTER — Encounter: Payer: Self-pay | Admitting: Physician Assistant

## 2016-09-16 VITALS — BP 190/110 | HR 104 | Ht 64.0 in | Wt 169.0 lb

## 2016-09-16 DIAGNOSIS — Z720 Tobacco use: Secondary | ICD-10-CM

## 2016-09-16 DIAGNOSIS — I10 Essential (primary) hypertension: Secondary | ICD-10-CM

## 2016-09-16 DIAGNOSIS — I25110 Atherosclerotic heart disease of native coronary artery with unstable angina pectoris: Secondary | ICD-10-CM

## 2016-09-16 DIAGNOSIS — I714 Abdominal aortic aneurysm, without rupture, unspecified: Secondary | ICD-10-CM

## 2016-09-16 DIAGNOSIS — T148XXA Other injury of unspecified body region, initial encounter: Secondary | ICD-10-CM

## 2016-09-16 MED ORDER — LISINOPRIL 10 MG PO TABS: 10.0000 mg | ORAL_TABLET | Freq: Every day | ORAL | 3 refills | Status: DC

## 2016-09-16 NOTE — Progress Notes (Signed)
Cardiology Office Note    Date:  09/16/2016   ID:  [name], DOB 1961-03-21, MRN [id number]  PCP:  Jacquelin Hawking, PA-C  Cardiologist: Dr. Diona Browner  Chief Complaint  Patient presents with  . Follow-up    History of Present Illness:  [name] is a 55 y.o. female  with a past medical history of anxiety/depression, polysubstance abuse, and uncontrolled HTN, s/p multiple cath, and PVD-s/p RCEA 2006 with known occluded LICA. She presented to [facility] on 08/05/16 with intermittent chest pain in the setting of uncontrolled hypertension and recent crack cocaine use. Her Troponin's were negative. Cath was done 08/07/16 which showed scatted disease as noted below. Plan is for medical Rx. EF was 55-60% by echo. Post cath the pt developed a large RFA hematoma. She required a blood transfusion secondary to this. Also this admission a carotid bruit was noted and dopplers done. 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  She saw Corine Shelter PA 08/19/16 for post hospital follow-up. She was only taking aspirin and complaining of right groin pain. She claims she received no prescriptions. She was given information about [facility]. She was given written prescriptions and referral to vascular surgeon for her carotid disease. She had a large right groin hematoma that needs to be followed up.  Patient was in the ER 08/25/16 with chest pain and flank pain. Blood pressure was very high. CT scan showed mostly chronic vascular disease however the patient does have an abdominal aortic aneurysm without rupture measuring 4.1 cm. Troponins were negative and blood pressure controlled after medicines given in the emergency room.  She comes in today for follow-up. Her right groin hematoma has improved. She can now walk without discomfort. She says she filled her prescriptions but ran out of her medications 2 days ago. She is going go to [place] and pick them up today.  Blood pressure is 190/110. She denies any drug use. No further chest pain. Still smoking about half a pack of cigarettes daily.     Past Medical History:  Diagnosis Date  . AAA (abdominal aortic aneurysm) without rupture (HCC) 08/25/2016  . Anxiety   . Bipolar 1 disorder (HCC)   . CAD (coronary artery disease)   . CAD (coronary artery disease)    a. 08/08/16 LAD 70%, 60% 1st diag, CTO RCA Med Rx EF 55%  . Crack cocaine use   . Depression   . Hypertension   . Right Groin Hematoma    a. 07/2016 following diagnostic cath  . Stroke (HCC)   . Tobacco abuse   . Unstable angina pectoris (HCC) 08/06/2016    Past Surgical History:  Procedure Laterality Date  . artery cleaned    . CARDIAC CATHETERIZATION N/A 08/07/2016   Procedure: Left Heart Cath and Coronary Angiography;  Surgeon: Lennette Bihari, MD;  Location: [facility] INVASIVE CV LAB;  Service: Cardiovascular;  Laterality: N/A;  . CYST REMOVAL TRUNK Left    beneath L breast  . TUBAL LIGATION    . VASCULAR SURGERY Right    R neck    Current Medications: Outpatient Medications Prior to Visit  Medication Sig Dispense Refill  . amLODipine (NORVASC) 5 MG tablet Take 1 tablet (5 mg total) by mouth daily. 90 tablet 2  . aspirin 81 MG chewable tablet Chew 1 tablet (81 mg total) by mouth daily. (Patient taking differently: Chew 162 mg by mouth daily. )    . atorvastatin (LIPITOR) 80 MG tablet  Take 1 tablet (80 mg total) by mouth daily at 6 PM. 90 tablet 1  . metoprolol (LOPRESSOR) 50 MG tablet Take 1 tablet (50 mg total) by mouth 2 (two) times daily. 180 tablet 1  . nitroGLYCERIN (NITROSTAT) 0.4 MG SL tablet Place 1 tablet (0.4 mg total) under the tongue every 5 (five) minutes x 3 doses as needed for chest pain. 25 tablet 3  . lisinopril (PRINIVIL,ZESTRIL) 5 MG tablet Take 1 tablet (5 mg total) by mouth daily. 90 tablet 1  . ALPRAZolam (XANAX) 0.5 MG tablet Take 1 tablet (0.5 mg total) by mouth at bedtime as needed for anxiety. (Patient not  taking: Reported on 08/27/2016) 12 tablet 0  . traMADol (ULTRAM) 50 MG tablet Take 1 tablet (50 mg total) by mouth every 6 (six) hours as needed. (Patient not taking: Reported on 08/27/2016) 20 tablet 0   No facility-administered medications prior to visit.      Allergies:   Penicillins   Social History   Social History  . Marital status: Single    Spouse name: N/A  . Number of children: N/A  . Years of education: N/A   Social History Main Topics  . Smoking status: Current Every Day Smoker    Packs/day: 0.50    Years: 42.00    Types: Cigarettes  . Smokeless tobacco: Never Used  . Alcohol use No  . Drug use:     Types: Marijuana, "Crack" cocaine     Comment: last crack cocain use was 07-29-16. last marijuana use week of nov. 6, 2017  . Sexual activity: Not Currently   Other Topics Concern  . None   Social History Narrative  . None     Family History:  The patient's   family history includes COPD in her paternal uncle; Cancer in her maternal aunt, maternal uncle, and paternal uncle; Diabetes in her mother; Heart attack in her father and mother; Heart disease in her father and mother; Hypertension in her father and mother; Stroke in her mother.   ROS:   Please see the history of present illness.    Review of Systems  Constitution: Negative.  HENT: Negative.   Eyes: Negative.   Cardiovascular: Positive for dyspnea on exertion.  Respiratory: Negative.   Hematologic/Lymphatic: Negative.   Musculoskeletal: Positive for muscle weakness and myalgias. Negative for joint pain.  Gastrointestinal: Negative.   Genitourinary: Negative.   Neurological: Negative.   Psychiatric/Behavioral: Positive for substance abuse. The patient is nervous/anxious.    All other systems reviewed and are negative.   PHYSICAL EXAM:   VS:  BP (!) 190/110   Pulse (!) 104   Ht 5\' 4"  (1.626 m)   Wt 169 lb (76.7 kg)   SpO2 94%   BMI 29.01 kg/m   Physical Exam  GEN: Well nourished, well  developed, in no acute distress  Neck: Bilateral carotid bruits no JVD Cardiac:RRR; positive S4 and 1/6 systolic murmur at the left sternal border  Respiratory:  Decreased breath sounds throughout but clear GI: soft, nontender, nondistended, + BS Ext: Right groin with soft hematoma about the size of an orange much smaller than a grapefruit size last office visit. Decreased distal pulses bilaterally MS: no deformity or atrophy  Psych: Anxious  Wt Readings from Last 3 Encounters:  09/16/16 169 lb (76.7 kg)  08/27/16 174 lb 12 oz (79.3 kg)  08/25/16 174 lb (78.9 kg)      Studies/Labs Reviewed:   EKG:  EKG is not ordered today.  Recent Labs: 08/06/2016: ALT 15; TSH 0.490 08/25/2016: BUN 15; Creatinine, Ser 0.66; Hemoglobin 14.0; Platelets 446; Potassium 3.6; Sodium 136   Lipid Panel    Component Value Date/Time   CHOL 201 (H) 08/07/2016 0255   TRIG 222 (H) 08/07/2016 0255   HDL 37 (L) 08/07/2016 0255   CHOLHDL 5.4 08/07/2016 0255   VLDL 44 (H) 08/07/2016 0255   LDLCALC 120 (H) 08/07/2016 0255    Additional studies/ records that were reviewed today include:  2-D echo 11/11/17Study Conclusions   - Left ventricle: The cavity size was normal. There was mild   concentric hypertrophy. Systolic function was normal. The   estimated ejection fraction was in the range of 55% to 60%. Wall   motion was normal; there were no regional wall motion   abnormalities. Doppler parameters are consistent with abnormal   left ventricular relaxation (grade 1 diastolic dysfunction). - Left atrium: The atrium was mildly dilated. - Atrial septum: No defect or patent foramen ovale was identified.   Cardiac catheterization 08/07/16 Conclusion     There is mild left ventricular systolic dysfunction.  The left ventricular ejection fraction is 45-50% by visual estimate.  There is no mitral valve regurgitation.  LV end diastolic pressure is mildly elevated.  Mid RCA lesion, 100  %stenosed.  Dist RCA lesion, 75 %stenosed.  Mid LAD-2 lesion, 60 %stenosed.  Mid LAD-1 lesion, 70 %stenosed.  Dist LAD-2 lesion, 45 %stenosed.  Dist LAD-1 lesion, 50 %stenosed.  2nd Mrg lesion, 40 %stenosed.   Moderate LV dysfunction with upper mid to basal inferior hypocontractility.  Global ejection fraction is 45-50%.   Multivessel  CAD with calcification of the proximal LAD with 70% followed by 60% stenoses between the first and second diagonal vessel with segmental 50% stenoses in the mid distal LAD; 40-50% diffuse narrowing in the distal circumflex marginal branch; an old total occlusion of the proximal RCA with bridging collaterals.   RECOMMENDATION: With the patient's long-standing history of tobacco use, polysubstance abuse, without taking medications and potential for noncompliance, recommend an initial attempt at aggressive medical management prior to any attempt at intervention to the LAD system.  With her hypertensive history will initiate amlodipine, both for blood pressure and potential ischemic and vasospastic benefit, continue carvedilol, high potency statin therapy  and possibly add nitrates and ACE/ARB if needed.  Will initiate aspirin and generic clopidogrel.       ASSESSMENT:    1. Coronary artery disease involving native coronary artery of native heart with unstable angina pectoris (HCC)   2. Essential hypertension   3. AAA (abdominal aortic aneurysm) without rupture (HCC)   4. Right Groin Hematoma   5. Tobacco abuse      PLAN:  In order of problems listed above:  CAD with scattered disease as listed above. Recommend medical therapy. No further chest pain. Also history of polysubstance abuse including crack cocaine which she says she is not using any longer. Follow-up with Dr. Diona BrownerMcDowell in January.  Essential hypertension blood pressure extremely high today. She ran out of her medications 2 days ago but is getting them filled today. She has no source of  income and is being followed at the clinic. Was in the emergency room with high blood pressures as well. Will increase lisinopril to 10 mg daily. Metoprolol already increased by [facility] M.[facility].  Abdominal aortic aneurysm found on CT angiogram 4.1 cm Already referred to vascular surgery for her carotids. They can follow this as well. She'll need a  follow-up in 1 year.  Right groin hematoma improving with reduction in size. Continue to follow  Tobacco abuse still smoking 11-12 cigarettes daily. She says she's trying to quit but it's been difficult. She has a lot of anxiety and is trying to get a prescription for Xanax.    Medication Adjustments/Labs and Tests Ordered: Current medicines are reviewed at length with the patient today.  Concerns regarding medicines are outlined above.  Medication changes, Labs and Tests ordered today are listed in the Patient Instructions below. Patient Instructions  Your physician recommends that you schedule a follow-up appointment in: Dr. Diona Browner in 1 Month  Your physician has recommended you make the following change in your medication: Increase Lisinopril to 10 mg Daily.   If you need a refill on your cardiac medications before your next appointment, please call your pharmacy.  Thank you for choosing [facility]!         Elson Clan, PA-C  09/16/2016 12:29 PM    [facility] [address] [place], [place], [place]  [address] Phone: [phone]; Fax: [phone]

## 2016-09-16 NOTE — Patient Instructions (Addendum)
Your physician recommends that you schedule a follow-up appointment in: Dr. Diona BrownerMcDowell in 1 Month  Your physician has recommended you make the following change in your medication: Increase Lisinopril to 10 mg Daily.   If you need a refill on your cardiac medications before your next appointment, please call your pharmacy.  Thank you for choosing [facility]!

## 2016-09-24 ENCOUNTER — Ambulatory Visit: Payer: Self-pay | Admitting: Physician Assistant

## 2016-10-08 ENCOUNTER — Ambulatory Visit: Payer: Self-pay | Admitting: Physician Assistant

## 2016-10-22 ENCOUNTER — Encounter (HOSPITAL_COMMUNITY): Payer: Self-pay

## 2016-10-22 ENCOUNTER — Encounter: Payer: Self-pay | Admitting: Vascular Surgery

## 2016-10-30 ENCOUNTER — Encounter: Payer: Self-pay | Admitting: Vascular Surgery

## 2016-11-02 ENCOUNTER — Encounter: Payer: Self-pay | Admitting: Cardiology

## 2016-11-02 ENCOUNTER — Ambulatory Visit: Payer: Self-pay | Admitting: Cardiology

## 2016-11-02 NOTE — Progress Notes (Deleted)
Cardiology Office Note  Date: 11/02/2016   ID: [name] LoftyDebra G Destin, DOB 11/16/1960, MRN [id number]  PCP: Jacquelin HawkingShannon McElroy, PA-C  Evaluating Cardiologist: Nona DellSamuel Destin Kittler, MD   No chief complaint on file.   History of Present Illness: [name] is a 56 y.o. female last seen by Ms. Lilla ShookLenze PA-C in December 2017. She presents for a routine follow-up visit. I reviewed her history which includes CAD complicated by polysubstance abuse and medication noncompliance. She was hospitalized in November 2017 with chest pain in the setting of crack cocaine use, cardiac catheterization results outlined below. Medical therapy recommended.  Past Medical History:  Diagnosis Date  . AAA (abdominal aortic aneurysm) without rupture (HCC) 08/25/2016  . Anxiety   . Bipolar 1 disorder (HCC)   . CAD (coronary artery disease)    a. 08/08/16 LAD 70%, 60% 1st diag, CTO RCA Med Rx EF 55%  . Crack cocaine use   . Depression   . Hypertension   . Right Groin Hematoma    a. 07/2016 following diagnostic cath  . Stroke (HCC)   . Tobacco abuse     Past Surgical History:  Procedure Laterality Date  . CARDIAC CATHETERIZATION N/A 08/07/2016   Procedure: Left Heart Cath and Coronary Angiography;  Surgeon: Lennette Biharihomas A Kelly, MD;  [facility];  Service: Cardiovascular;  Laterality: N/A;  . CYST REMOVAL TRUNK Left    beneath L breast  . TUBAL LIGATION    . VASCULAR SURGERY Right    R neck    Current Outpatient Prescriptions  Medication Sig Dispense Refill  . amLODipine (NORVASC) 5 MG tablet Take 1 tablet (5 mg total) by mouth daily. 90 tablet 2  . aspirin 81 MG chewable tablet Chew 1 tablet (81 mg total) by mouth daily. (Patient taking differently: Chew 162 mg by mouth daily. )    . atorvastatin (LIPITOR) 80 MG tablet Take 1 tablet (80 mg total) by mouth daily at 6 PM. 90 tablet 1  . lisinopril (PRINIVIL,ZESTRIL) 10 MG tablet Take 1 tablet (10 mg total) by mouth daily. 90 tablet 3  . metoprolol  (LOPRESSOR) 50 MG tablet Take 1 tablet (50 mg total) by mouth 2 (two) times daily. 180 tablet 1  . nitroGLYCERIN (NITROSTAT) 0.4 MG SL tablet Place 1 tablet (0.4 mg total) under the tongue every 5 (five) minutes x 3 doses as needed for chest pain. 25 tablet 3   No current facility-administered medications for this visit.    Allergies:  Penicillins   Social History: The patient  reports that she has been smoking Cigarettes.  She has a 21.00 pack-year smoking history. She has never used smokeless tobacco. She reports that she uses drugs, including Marijuana and "Crack" cocaine. She reports that she does not drink alcohol.   Family History: The patient's family history includes COPD in her paternal uncle; Cancer in her maternal aunt, maternal uncle, and paternal uncle; Diabetes in her mother; Heart attack in her father and mother; Heart disease in her father and mother; Hypertension in her father and mother; Stroke in her mother.   ROS:  Please see the history of present illness. Otherwise, complete review of systems is positive for {NONE DEFAULTED:18576::"none"}.  All other systems are reviewed and negative.   Physical Exam: VS:  There were no vitals taken for this visit., BMI There is no height or weight on file to calculate BMI.  Wt Readings from Last 3 Encounters:  09/16/16 169 lb (76.7 kg)  08/27/16  174 lb 12 oz (79.3 kg)  08/25/16 174 lb (78.9 kg)    General: Patient appears comfortable at rest. HEENT: Conjunctiva and lids normal, oropharynx clear with moist mucosa. Neck: Supple, no elevated JVP or carotid bruits, no thyromegaly. Lungs: Clear to auscultation, nonlabored breathing at rest. Cardiac: Regular rate and rhythm, no S3 or significant systolic murmur, no pericardial rub. Abdomen: Soft, nontender, no hepatomegaly, bowel sounds present, no guarding or rebound. Extremities: No pitting edema, distal pulses 2+. Skin: Warm and dry. Musculoskeletal: No kyphosis. Neuropsychiatric:  Alert and oriented x3, affect grossly appropriate.  ECG: I personally reviewed the tracing from 08/25/2016 which showed sinus rhythm with poor R-wave progression.  Recent Labwork: 08/06/2016: ALT 15; AST 21; TSH 0.490 08/25/2016: BUN 15; Creatinine, Ser 0.66; Hemoglobin 14.0; Platelets 446; Potassium 3.6; Sodium 136     Component Value Date/Time   CHOL 201 (H) 08/07/2016 0255   TRIG 222 (H) 08/07/2016 0255   HDL 37 (L) 08/07/2016 0255   CHOLHDL 5.4 08/07/2016 0255   VLDL 44 (H) 08/07/2016 0255   LDLCALC 120 (H) 08/07/2016 0255    Other Studies Reviewed Today:  Echocardiogram 08/08/2016: Study Conclusions  - Left ventricle: The cavity size was normal. There was mild   concentric hypertrophy. Systolic function was normal. The   estimated ejection fraction was in the range of 55% to 60%. Wall   motion was normal; there were no regional wall motion   abnormalities. Doppler parameters are consistent with abnormal   left ventricular relaxation (grade 1 diastolic dysfunction). - Left atrium: The atrium was mildly dilated. - Atrial septum: No defect or patent foramen ovale was identified.  Cardiac catheterization 08/07/2016:  There is mild left ventricular systolic dysfunction.  The left ventricular ejection fraction is 45-50% by visual estimate.  There is no mitral valve regurgitation.  LV end diastolic pressure is mildly elevated.  Mid RCA lesion, 100 %stenosed.  Dist RCA lesion, 75 %stenosed.  Mid LAD-2 lesion, 60 %stenosed.  Mid LAD-1 lesion, 70 %stenosed.  Dist LAD-2 lesion, 45 %stenosed.  Dist LAD-1 lesion, 50 %stenosed.  2nd Mrg lesion, 40 %stenosed.   Moderate LV dysfunction with upper mid to basal inferior hypocontractility.  Global ejection fraction is 45-50%.  Multivessel  CAD with calcification of the proximal LAD with 70% followed by 60% stenoses between the first and second diagonal vessel with segmental 50% stenoses in the mid distal LAD; 40-50%  diffuse narrowing in the distal circumflex marginal branch; an old total occlusion of the proximal RCA with bridging collaterals.  Assessment and Plan:    Current medicines were reviewed with the patient today.  No orders of the defined types were placed in this encounter.   Disposition:  Signed, Jonelle Sidle, MD, [facility] 11/02/2016 9:03 AM    [facility] [address] [place], [place], [place] [address] Phone: [phone]; Fax: [phone]

## 2016-11-03 ENCOUNTER — Other Ambulatory Visit: Payer: Self-pay

## 2016-11-03 DIAGNOSIS — I6529 Occlusion and stenosis of unspecified carotid artery: Secondary | ICD-10-CM

## 2016-11-04 ENCOUNTER — Encounter: Payer: Self-pay | Admitting: Vascular Surgery

## 2016-11-04 ENCOUNTER — Encounter: Payer: Self-pay | Admitting: Physician Assistant

## 2016-11-04 ENCOUNTER — Inpatient Hospital Stay (HOSPITAL_COMMUNITY): Admission: RE | Admit: 2016-11-04 | Payer: Self-pay | Source: Ambulatory Visit

## 2016-11-04 ENCOUNTER — Ambulatory Visit: Payer: Self-pay | Admitting: Physician Assistant

## 2016-11-04 VITALS — BP 186/98 | HR 70 | Ht 63.0 in | Wt 165.5 lb

## 2016-11-04 DIAGNOSIS — I739 Peripheral vascular disease, unspecified: Secondary | ICD-10-CM

## 2016-11-04 DIAGNOSIS — F1410 Cocaine abuse, uncomplicated: Secondary | ICD-10-CM

## 2016-11-04 DIAGNOSIS — Z91199 Patient's noncompliance with other medical treatment and regimen due to unspecified reason: Secondary | ICD-10-CM

## 2016-11-04 DIAGNOSIS — E785 Hyperlipidemia, unspecified: Secondary | ICD-10-CM

## 2016-11-04 DIAGNOSIS — I714 Abdominal aortic aneurysm, without rupture, unspecified: Secondary | ICD-10-CM

## 2016-11-04 DIAGNOSIS — I779 Disorder of arteries and arterioles, unspecified: Secondary | ICD-10-CM

## 2016-11-04 DIAGNOSIS — F39 Unspecified mood [affective] disorder: Secondary | ICD-10-CM

## 2016-11-04 DIAGNOSIS — Z9119 Patient's noncompliance with other medical treatment and regimen: Secondary | ICD-10-CM

## 2016-11-04 DIAGNOSIS — F17218 Nicotine dependence, cigarettes, with other nicotine-induced disorders: Secondary | ICD-10-CM

## 2016-11-04 DIAGNOSIS — I10 Essential (primary) hypertension: Secondary | ICD-10-CM

## 2016-11-04 DIAGNOSIS — I25110 Atherosclerotic heart disease of native coronary artery with unstable angina pectoris: Secondary | ICD-10-CM

## 2016-11-04 MED ORDER — METOPROLOL TARTRATE 100 MG PO TABS: 100.0000 mg | ORAL_TABLET | Freq: Two times a day (BID) | ORAL | 1 refills | Status: DC

## 2016-11-04 MED ORDER — LISINOPRIL 20 MG PO TABS: 20.0000 mg | ORAL_TABLET | Freq: Every day | ORAL | 1 refills | Status: DC

## 2016-11-04 NOTE — Progress Notes (Signed)
BP (!) 186/98 (BP Location: Left Arm, Patient Position: Sitting, Cuff Size: Normal)   Pulse 70   Ht 5\' 3"  (1.6 m)   Wt 165 lb 8 oz (75.1 kg)   SpO2 97%   BMI 29.32 kg/m    Subjective:    Patient ID: [name], female    DOB: 09/25/1961, 56 y.o.   MRN: [id number]  HPI: [name] is a 56 y.o. female presenting on 11/04/2016 for Follow-up   HPI   Pt was scheduled to f/u in december and didn't.   Pt upset b/c her mother died 3 years ago and yesterday was the 3rd anniversay of her funeral.  Pt tearful.  Pt did not call [facility] for MH as recommended.    Pt still using cocaine sometimes - used in January.  Still smoking  Pt states metamucil helped her constipation  Pt did get her meds from [facility]  Relevant past medical, surgical, family and social history reviewed and updated as indicated. Interim medical history since our last visit reviewed. Allergies and medications reviewed and updated.   Current Outpatient Prescriptions:  .  amLODipine (NORVASC) 5 MG tablet, Take 1 tablet (5 mg total) by mouth daily., Disp: 90 tablet, Rfl: 2 .  aspirin 81 MG chewable tablet, Chew 1 tablet (81 mg total) by mouth daily. (Patient taking differently: Chew 162 mg by mouth daily. ), Disp: , Rfl:  .  atorvastatin (LIPITOR) 80 MG tablet, Take 1 tablet (80 mg total) by mouth daily at 6 PM., Disp: 90 tablet, Rfl: 1 .  lisinopril (PRINIVIL,ZESTRIL) 10 MG tablet, Take 1 tablet (10 mg total) by mouth daily., Disp: 90 tablet, Rfl: 3 .  metoprolol (LOPRESSOR) 50 MG tablet, Take 1 tablet (50 mg total) by mouth 2 (two) times daily., Disp: 180 tablet, Rfl: 1 .  nitroGLYCERIN (NITROSTAT) 0.4 MG SL tablet, Place 1 tablet (0.4 mg total) under the tongue every 5 (five) minutes x 3 doses as needed for chest pain., Disp: 25 tablet, Rfl: 3   Review of Systems  Constitutional: Positive for diaphoresis and fatigue. Negative for appetite change, chills, fever and unexpected weight change.  HENT:  Negative for congestion, drooling, ear pain, facial swelling, hearing loss, mouth sores, sneezing, sore throat, trouble swallowing and voice change.   Eyes: Negative for pain, discharge, redness, itching and visual disturbance.  Respiratory: Positive for choking. Negative for cough, shortness of breath and wheezing.   Cardiovascular: Positive for leg swelling. Negative for chest pain and palpitations.  Gastrointestinal: Positive for constipation. Negative for abdominal pain, blood in stool, diarrhea and vomiting.  Endocrine: Negative for cold intolerance, heat intolerance and polydipsia.  Genitourinary: Negative for decreased urine volume, dysuria and hematuria.  Musculoskeletal: Negative for arthralgias, back pain and gait problem.  Skin: Negative for rash.  Allergic/Immunologic: Negative for environmental allergies.  Neurological: Negative for seizures, syncope, light-headedness and headaches.  Hematological: Negative for adenopathy.  Psychiatric/Behavioral: Negative for agitation, dysphoric mood and suicidal ideas. The patient is not nervous/anxious.     Per HPI unless specifically indicated above     Objective:    BP (!) 186/98 (BP Location: Left Arm, Patient Position: Sitting, Cuff Size: Normal)   Pulse 70   Ht 5\' 3"  (1.6 m)   Wt 165 lb 8 oz (75.1 kg)   SpO2 97%   BMI 29.32 kg/m   Wt Readings from Last 3 Encounters:  11/04/16 165 lb 8 oz (75.1 kg)  09/16/16 169 lb (76.7 kg)  08/27/16 174  lb 12 oz (79.3 kg)    Physical Exam  Constitutional: She is oriented to person, place, and time. She appears well-developed and well-nourished.  HENT:  Head: Normocephalic and atraumatic.  Neck: Neck supple.  Cardiovascular: Normal rate and regular rhythm.   Pulmonary/Chest: Effort normal and breath sounds normal.  Abdominal: Soft. Bowel sounds are normal. She exhibits no mass. There is no hepatosplenomegaly. There is no tenderness.  Musculoskeletal: She exhibits no edema.   Lymphadenopathy:    She has no cervical adenopathy.  Neurological: She is alert and oriented to person, place, and time.  Skin: Skin is warm and dry.  Psychiatric: She has a normal mood and affect. Her behavior is normal.  Vitals reviewed.       Assessment & Plan:   Encounter Diagnoses  Name Primary?  . Essential hypertension Yes  . Coronary artery disease involving native coronary artery of native heart with unstable angina pectoris (HCC)   . AAA (abdominal aortic aneurysm) without rupture (HCC)   . Dyslipidemia   . Cigarette nicotine dependence with other nicotine-induced disorder   . Carotid disease, bilateral (HCC)   . Mood disorder (HCC)   . Cocaine abuse   . Personal history of noncompliance with medical treatment, presenting hazards to health     -Check fasting labs tomorrow morning -Increase lisinopril to 20mg -  And increase metoprolol to 100mg  bid - rx sent to [facility] -Gave pt  [facility] card to contact to get help with MH and substance abuse. -gave pt [facility] information to help with her transportation issues  -urged pt to avoid drug use (cocaine) because with her health problems, it could easily cause her to perish -had long discussion with pt about how important it is for her to come to her appointments so we can get her very high BP dow.  She states understanding -pt has referral in [facility] for vascular surgeon but she says she hasn't been contacted about appointment.  Pt does not know if she has [facility] discount or even if she has ever applied.  Nurse will call Fransisca Connors to find out and notify pt.  If she doesn't have discount, she is to turn in the application that was given to her today.  Pt states understanding -F/u 4 weeks.  RTO sooner prn  (The duration of this appointment visit was 25 minutes of face-to-face time with the patient.  Greater than 50% of this time was spent in counseling, explanation of diagnosis, planning of further management, and coordination of  care.)

## 2016-11-05 ENCOUNTER — Ambulatory Visit: Payer: Self-pay | Admitting: Physician Assistant

## 2016-11-05 ENCOUNTER — Telehealth: Payer: Self-pay | Admitting: Student

## 2016-11-05 NOTE — Telephone Encounter (Signed)
Called and notified pt that she is to fill out anther application and submit to [employer] [facility] at [facility]. [facility] discount application is to be mailed to patient's address [address] [place] [id number]. Pt verbalized understanding.

## 2016-11-26 ENCOUNTER — Other Ambulatory Visit: Payer: Self-pay | Admitting: Physician Assistant

## 2016-11-26 MED ORDER — METOPROLOL TARTRATE 100 MG PO TABS: 100.0000 mg | ORAL_TABLET | Freq: Two times a day (BID) | ORAL | 1 refills | Status: DC

## 2016-12-02 ENCOUNTER — Ambulatory Visit: Payer: Self-pay | Admitting: Physician Assistant

## 2016-12-07 NOTE — Progress Notes (Deleted)
Cardiology Office Note  Date: 12/07/2016   ID: [name] LoftyDebra G [name], DOB 04/05/1961, MRN [id number]  PCP: Jacquelin HawkingShannon McElroy, PA-C  Evaulating Cardiologist: Nona DellSamuel Starsha Morning, MD   No chief complaint on file.   History of Present Illness: [name] is a medically complex and high risk 56 y.o. female seen by Ms. Lilla ShookLenze PA-C in December 2017. Records indicate prior follow-up with me in the remote past. She has a history of medication noncompliance, polysubstance abuse including crack cocaine, and atherosclerotic disease. I reviewed extensive interval records and updated her chart. She saw her PCP recently in February, I reviewed the note.  She has an asymptomatic 4.1 cm abdominal aortic aneurysm and also severe bilateral carotid artery disease, was already referred to vascular surgery however has not been evaluated as yet.  Cardiac catheterization from November 2017 per Dr. Tresa EndoKelly showed multivessel CAD, results as outlined below.  Past Medical History:  Diagnosis Date  . AAA (abdominal aortic aneurysm) without rupture (HCC) 08/25/2016  . Anxiety   . Bipolar 1 disorder (HCC)   . CAD (coronary artery disease)    a. 08/08/16 LAD 70%, 60% 1st diag, CTO RCA Med Rx EF 55%  . Crack cocaine use   . Depression   . Hypertension   . Right Groin Hematoma    a. 07/2016 following diagnostic cath  . Stroke (HCC)   . Tobacco abuse     Past Surgical History:  Procedure Laterality Date  . CARDIAC CATHETERIZATION N/A 08/07/2016   Procedure: Left Heart Cath and Coronary Angiography;  Surgeon: Lennette Biharihomas A Kelly, MD;  [facility];  Service: Cardiovascular;  Laterality: N/A;  . CYST REMOVAL TRUNK Left    beneath L breast  . TUBAL LIGATION    . VASCULAR SURGERY Right    R neck    Current Outpatient Prescriptions  Medication Sig Dispense Refill  . amLODipine (NORVASC) 5 MG tablet Take 1 tablet (5 mg total) by mouth daily. 90 tablet 2  . aspirin 81 MG chewable tablet Chew 1 tablet  (81 mg total) by mouth daily. (Patient taking differently: Chew 162 mg by mouth daily. )    . atorvastatin (LIPITOR) 80 MG tablet Take 1 tablet (80 mg total) by mouth daily at 6 PM. 90 tablet 1  . lisinopril (PRINIVIL,ZESTRIL) 20 MG tablet Take 1 tablet (20 mg total) by mouth daily. 90 tablet 1  . metoprolol (LOPRESSOR) 100 MG tablet Take 1 tablet (100 mg total) by mouth 2 (two) times daily. 60 tablet 1  . nitroGLYCERIN (NITROSTAT) 0.4 MG SL tablet Place 1 tablet (0.4 mg total) under the tongue every 5 (five) minutes x 3 doses as needed for chest pain. 25 tablet 3   No current facility-administered medications for this visit.    Allergies:  Penicillins   Social History: The patient  reports that she has been smoking Cigarettes.  She has a 21.00 pack-year smoking history. She has never used smokeless tobacco. She reports that she uses drugs, including Marijuana and "Crack" cocaine. She reports that she does not drink alcohol.   Family History: The patient's family history includes COPD in her paternal uncle; Cancer in her maternal aunt, maternal uncle, and paternal uncle; Diabetes in her mother; Heart attack in her father and mother; Heart disease in her father and mother; Hypertension in her father and mother; Stroke in her mother.   ROS:  Please see the history of present illness. Otherwise, complete review of systems is positive for {NONE  DEFAULTED:18576::"none"}.  All other systems are reviewed and negative.   Physical Exam: VS:  There were no vitals taken for this visit., BMI There is no height or weight on file to calculate BMI.  Wt Readings from Last 3 Encounters:  11/04/16 165 lb 8 oz (75.1 kg)  09/16/16 169 lb (76.7 kg)  08/27/16 174 lb 12 oz (79.3 kg)    General: Patient appears comfortable at rest. HEENT: Conjunctiva and lids normal, oropharynx clear with moist mucosa. Neck: Supple, no elevated JVP or carotid bruits, no thyromegaly. Lungs: Clear to auscultation, nonlabored  breathing at rest. Cardiac: Regular rate and rhythm, no S3 or significant systolic murmur, no pericardial rub. Abdomen: Soft, nontender, no hepatomegaly, bowel sounds present, no guarding or rebound. Extremities: No pitting edema, distal pulses 2+. Skin: Warm and dry. Musculoskeletal: No kyphosis. Neuropsychiatric: Alert and oriented x3, affect grossly appropriate.  ECG: I personally reviewed the tracing from 08/25/2016 which showed normal sinus rhythm and nonspecific ST changes.  Recent Labwork: 08/06/2016: ALT 15; AST 21; TSH 0.490 08/25/2016: BUN 15; Creatinine, Ser 0.66; Hemoglobin 14.0; Platelets 446; Potassium 3.6; Sodium 136     Component Value Date/Time   CHOL 201 (H) 08/07/2016 0255   TRIG 222 (H) 08/07/2016 0255   HDL 37 (L) 08/07/2016 0255   CHOLHDL 5.4 08/07/2016 0255   VLDL 44 (H) 08/07/2016 0255   LDLCALC 120 (H) 08/07/2016 0255    Other Studies Reviewed Today:  Echocardiogram 08/08/2016: Study Conclusions  - Left ventricle: The cavity size was normal. There was mild   concentric hypertrophy. Systolic function was normal. The   estimated ejection fraction was in the range of 55% to 60%. Wall   motion was normal; there were no regional wall motion   abnormalities. Doppler parameters are consistent with abnormal   left ventricular relaxation (grade 1 diastolic dysfunction). - Left atrium: The atrium was mildly dilated. - Atrial septum: No defect or patent foramen ovale was identified.  Cardiac catheterization 08/07/2016:  There is mild left ventricular systolic dysfunction.  The left ventricular ejection fraction is 45-50% by visual estimate.  There is no mitral valve regurgitation.  LV end diastolic pressure is mildly elevated.  Mid RCA lesion, 100 %stenosed.  Dist RCA lesion, 75 %stenosed.  Mid LAD-2 lesion, 60 %stenosed.  Mid LAD-1 lesion, 70 %stenosed.  Dist LAD-2 lesion, 45 %stenosed.  Dist LAD-1 lesion, 50 %stenosed.  2nd Mrg lesion, 40  %stenosed.   Moderate LV dysfunction with upper mid to basal inferior hypocontractility.  Global ejection fraction is 45-50%.  Multivessel  CAD with calcification of the proximal LAD with 70% followed by 60% stenoses between the first and second diagonal vessel with segmental 50% stenoses in the mid distal LAD; 40-50% diffuse narrowing in the distal circumflex marginal branch; an old total occlusion of the proximal RCA with bridging collaterals.  RECOMMENDATION: With the patient's long-standing history of tobacco use, polysubstance abuse, without taking medications and potential for noncompliance, recommend an initial attempt at aggressive medical management prior to any attempt at intervention to the LAD system.  With her hypertensive history will initiate amlodipine, both for blood pressure and potential ischemic and vasospastic benefit, continue carvedilol, high potency statin therapy  and possibly add nitrates and ACE/ARB if needed. Will initiate aspirin and generic clopidogrel.   Carotid Dopplers 08/07/2016: Summary:  - The vertebral arteries appear patent with antegrade flow. - Findings consistent with greater than 80 percent stenosis   involving the right internal carotid artery. - Study suggests an occluded  left internal carotid artery.  Assessment and Plan:    Current medicines were reviewed with the patient today.  No orders of the defined types were placed in this encounter.   Disposition:  Signed, Jonelle Sidle, MD, [facility] 12/07/2016 12:38 PM    [facility] [address] [place], [place], [place] [address] Phone: [phone]; Fax: [phone]

## 2016-12-08 ENCOUNTER — Encounter: Payer: Self-pay | Admitting: Cardiology

## 2016-12-08 ENCOUNTER — Ambulatory Visit (INDEPENDENT_AMBULATORY_CARE_PROVIDER_SITE_OTHER): Payer: Self-pay | Admitting: Cardiology

## 2016-12-08 ENCOUNTER — Ambulatory Visit: Payer: Self-pay | Admitting: Cardiology

## 2016-12-08 VITALS — BP 167/96 | HR 55 | Ht 64.0 in | Wt 167.0 lb

## 2016-12-08 DIAGNOSIS — Z720 Tobacco use: Secondary | ICD-10-CM

## 2016-12-08 DIAGNOSIS — I10 Essential (primary) hypertension: Secondary | ICD-10-CM

## 2016-12-08 DIAGNOSIS — I714 Abdominal aortic aneurysm, without rupture, unspecified: Secondary | ICD-10-CM

## 2016-12-08 DIAGNOSIS — I779 Disorder of arteries and arterioles, unspecified: Secondary | ICD-10-CM

## 2016-12-08 DIAGNOSIS — E782 Mixed hyperlipidemia: Secondary | ICD-10-CM

## 2016-12-08 DIAGNOSIS — I25119 Atherosclerotic heart disease of native coronary artery with unspecified angina pectoris: Secondary | ICD-10-CM

## 2016-12-08 DIAGNOSIS — I739 Peripheral vascular disease, unspecified: Secondary | ICD-10-CM

## 2016-12-08 MED ORDER — AMLODIPINE BESYLATE 10 MG PO TABS: 10.0000 mg | ORAL_TABLET | Freq: Every day | ORAL | 1 refills | Status: DC

## 2016-12-08 NOTE — Progress Notes (Signed)
Cardiology Office Note  Date: 12/08/2016   ID: ZAELYN [name], DOB 07/23/1961, MRN [id number]  PCP: Jacquelin Hawking, PA-C  Evaluating Cardiologist: Nona Dell, MD   Chief Complaint  Patient presents with  . Coronary Artery Disease  . PAD    History of Present Illness: [name] is a medically complex and high risk 56 y.o. female last seen in the office by Ms. Lilla Shook in December 2017. I reviewed extensive records, have not actually seen her since 2011 based on chart review. Cardiac evaluation in November 2017 occurred in the setting of uncontrolled hypertension and crack cocaine use. She underwent cardiac catheterization that revealed multivessel CAD that was felt to be best managed medically at the time.  She also has known PAD including abdominal aortic aneurysm measuring 4.1 cm and also fairly severe carotid artery disease. She had already been referred for vascular surgery evaluation, however this has not yet occurred.  She presents today stating that she has occasional angina, has only used one nitroglycerin in the last few months however. Her medications have been adjusted for better blood pressure control, trend is coming down somewhat but is not optimal.  She continues to smoke cigarettes, we discussed smoking cessation, she has had significant difficulty quitting. She is smoking a half a pack a day. She does tell me that she is staying away from crack cocaine however.  Today we reviewed her cardiac and peripheral arterial diagnoses, increased risk of heart attack and stroke, and importance of regular medical therapy and follow-up in addition to lifestyle modification.  Past Medical History:  Diagnosis Date  . AAA (abdominal aortic aneurysm) without rupture (HCC) 08/25/2016  . Anxiety   . Bipolar 1 disorder (HCC)   . CAD (coronary artery disease)    a. 08/08/16 LAD 70%, 60% 1st diag, CTO RCA Med Rx EF 55%  . Crack cocaine use   . Depression   .  Hypertension   . Right Groin Hematoma    a. 07/2016 following diagnostic cath  . Stroke (HCC)   . Tobacco abuse     Past Surgical History:  Procedure Laterality Date  . CARDIAC CATHETERIZATION N/A 08/07/2016   Procedure: Left Heart Cath and Coronary Angiography;  Surgeon: Lennette Bihari, MD;  Location: [facility] INVASIVE CV LAB;  Service: Cardiovascular;  Laterality: N/A;  . CYST REMOVAL TRUNK Left    beneath L breast  . TUBAL LIGATION    . VASCULAR SURGERY Right    R neck    Current Outpatient Prescriptions  Medication Sig Dispense Refill  . aspirin EC 81 MG tablet Take 81 mg by mouth daily.    Marland Kitchen atorvastatin (LIPITOR) 80 MG tablet Take 1 tablet (80 mg total) by mouth daily at 6 PM. 90 tablet 1  . lisinopril (PRINIVIL,ZESTRIL) 20 MG tablet Take 1 tablet (20 mg total) by mouth daily. 90 tablet 1  . metoprolol (LOPRESSOR) 100 MG tablet Take 1 tablet (100 mg total) by mouth 2 (two) times daily. 60 tablet 1  . nitroGLYCERIN (NITROSTAT) 0.4 MG SL tablet Place 1 tablet (0.4 mg total) under the tongue every 5 (five) minutes x 3 doses as needed for chest pain. 25 tablet 3   No current facility-administered medications for this visit.    Allergies:  Penicillins   Social History: The patient  reports that she has been smoking Cigarettes.  She has a 21.00 pack-year smoking history. She has never used smokeless tobacco. She reports that she uses drugs, including  Marijuana and "Crack" cocaine. She reports that she does not drink alcohol.   Family History: The patient's family history includes COPD in her paternal uncle; Cancer in her maternal aunt, maternal uncle, and paternal uncle; Diabetes in her mother; Heart attack in her father and mother; Heart disease in her father and mother; Hypertension in her father and mother; Stroke in her mother.   ROS:  Please see the history of present illness. Otherwise, complete review of systems is positive for intermittent angina, mainly with emotional stress.   All other systems are reviewed and negative.   Physical Exam: VS:  BP (!) 167/96   Pulse (!) 55   Ht 5\' 4"  (1.626 m)   Wt 167 lb (75.8 kg)   SpO2 98%   BMI 28.67 kg/m , BMI Body mass index is 28.67 kg/m.  Wt Readings from Last 3 Encounters:  12/08/16 167 lb (75.8 kg)  11/04/16 165 lb 8 oz (75.1 kg)  09/16/16 169 lb (76.7 kg)    General: Patient appears comfortable at rest. HEENT: Conjunctiva and lids normal, oropharynx clear with poor dentition. Neck: Supple, no elevated JVP, prominent right carotid bruit, no thyromegaly. Lungs: Decreased breath sounds without wheezing, nonlabored breathing at rest. Cardiac: Regular rate and rhythm, no S3, soft systolic murmur, no pericardial rub. Abdomen: Soft, nontender, bowel sounds present, no guarding or rebound. Extremities: No pitting edema, distal pulses 1+. Skin: Warm and dry. Musculoskeletal: No kyphosis. Neuropsychiatric: Alert and oriented x3, affect grossly appropriate.  ECG: I personally reviewed the tracing from 08/25/2016 which showed sinus rhythm with anterior and possibly old inferior infarct pattern.  Recent Labwork: 08/06/2016: ALT 15; AST 21; TSH 0.490 08/25/2016: BUN 15; Creatinine, Ser 0.66; Hemoglobin 14.0; Platelets 446; Potassium 3.6; Sodium 136     Component Value Date/Time   CHOL 201 (H) 08/07/2016 0255   TRIG 222 (H) 08/07/2016 0255   HDL 37 (L) 08/07/2016 0255   CHOLHDL 5.4 08/07/2016 0255   VLDL 44 (H) 08/07/2016 0255   LDLCALC 120 (H) 08/07/2016 0255    Other Studies Reviewed Today:  Echocardiogram 08/08/2016: Study Conclusions  - Left ventricle: The cavity size was normal. There was mild   concentric hypertrophy. Systolic function was normal. The   estimated ejection fraction was in the range of 55% to 60%. Wall   motion was normal; there were no regional wall motion   abnormalities. Doppler parameters are consistent with abnormal   left ventricular relaxation (grade 1 diastolic dysfunction). -  Left atrium: The atrium was mildly dilated. - Atrial septum: No defect or patent foramen ovale was identified.  Cardiac catheterization 08/07/2016:  There is mild left ventricular systolic dysfunction.  The left ventricular ejection fraction is 45-50% by visual estimate.  There is no mitral valve regurgitation.  LV end diastolic pressure is mildly elevated.  Mid RCA lesion, 100 %stenosed.  Dist RCA lesion, 75 %stenosed.  Mid LAD-2 lesion, 60 %stenosed.  Mid LAD-1 lesion, 70 %stenosed.  Dist LAD-2 lesion, 45 %stenosed.  Dist LAD-1 lesion, 50 %stenosed.  2nd Mrg lesion, 40 %stenosed.   Moderate LV dysfunction with upper mid to basal inferior hypocontractility.  Global ejection fraction is 45-50%.  Multivessel  CAD with calcification of the proximal LAD with 70% followed by 60% stenoses between the first and second diagonal vessel with segmental 50% stenoses in the mid distal LAD; 40-50% diffuse narrowing in the distal circumflex marginal branch; an old total occlusion of the proximal RCA with bridging collaterals.  Carotid Dopplers 08/07/2016: Summary:  -  The vertebral arteries appear patent with antegrade flow. - Findings consistent with greater than 80 percent stenosis   involving the right internal carotid artery. - Study suggests an occluded left internal carotid artery.  Abdominal and pelvic CT 08/25/2016: IMPRESSION: No evidence of intramural hematoma or aortic dissection.  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 The left vertebral artery does not opacified. Patency cannot be confirmed.  There are atherosclerotic changes of the aortic arch and descending thoracic aorta without dissection or focal narrowing.  Abdominal aortic aneurysm maximal diameter is 4.1 cm. Recommend followup by ultrasound  in 1 year. This recommendation follows ACR consensus guidelines: White Paper of the ACR Incidental Findings Committee II on Vascular Findings. J Am Coll Radiol 2013; 10:789-794.  Significant narrowing in the right and left common iliac arteries as described.  Left ovarian cysts as described. Please follow recommendations on the prior report.  Left anterior thigh hematoma is evolving. It is not significantly changed in size.  Assessment and Plan:  1. Multivessel CAD as outlined above, managed medically as of November 2017, mainly in light of demonstrated noncompliance and polysubstance abuse. She is relatively stable from the perspective of angina symptoms on current regimen. Heart rate is well controlled. Plan to increase Norvasc to 10 mg daily at this time. Continue aspirin and statin.  2. Long-standing tobacco abuse. We discussed the importance of smoking cessation. She has had significant difficulty quitting.  3. History of crack cocaine use, she denies recurrence since presentation late last year.  4. Asymptomatic 4.1 cm abdominal aortic aneurysm.  5. Severe bilateral carotid artery disease, occluded on the left and at least 80% stenosis on the right. I do not have complete records, it appear she had surgery on the right by Dr. Madilyn Fireman several years ago. I made it clear to her that she needs to follow with vascular surgery to discuss her revascularization options. Office is assisting with this.   6. Hyperlipidemia, last LDL 120. She reports compliance on high-dose Lipitor. She has pending follow-up lab work with PCP.  7. Essential hypertension, blood pressure control not optimal but trend is improving.  Current medicines were reviewed with the patient today.  Disposition: Follow-up in 4 months.   Signed, Jonelle Sidle, MD, [facility] 12/08/2016 1:36 PM    [facility] at [facility] [address] [place], [place], [place] [address] Phone: [phone]; Fax: [phone]

## 2016-12-08 NOTE — Patient Instructions (Signed)
   Your physician has recommended you make the following change in your medication:   Increase Amlodipine to 10mg  Daily. You make take 2 of your 5mg  tablets until they are finished.   Continue all other medications the same.   You have been [place] in [place].   Your physician wants you to follow-up in: 4 months. You will receive a reminder letter in the mail two months in advance. If you don't receive a letter, please call our office to schedule the follow-up appointment.

## 2016-12-21 ENCOUNTER — Encounter: Payer: Self-pay | Admitting: Physician Assistant

## 2016-12-23 ENCOUNTER — Encounter (HOSPITAL_COMMUNITY): Payer: Self-pay

## 2016-12-23 ENCOUNTER — Encounter: Payer: Self-pay | Admitting: Vascular Surgery

## 2017-02-08 ENCOUNTER — Encounter: Payer: Self-pay | Admitting: Vascular Surgery

## 2017-02-10 ENCOUNTER — Encounter: Payer: Self-pay | Admitting: Vascular Surgery

## 2017-02-10 ENCOUNTER — Ambulatory Visit (INDEPENDENT_AMBULATORY_CARE_PROVIDER_SITE_OTHER): Payer: Self-pay | Admitting: Vascular Surgery

## 2017-02-10 ENCOUNTER — Telehealth: Payer: Self-pay | Admitting: Vascular Surgery

## 2017-02-10 ENCOUNTER — Ambulatory Visit (HOSPITAL_COMMUNITY)
Admission: RE | Admit: 2017-02-10 | Discharge: 2017-02-10 | Disposition: A | Discharge status: Home / Self Care | Payer: Self-pay | Source: Ambulatory Visit | Attending: Vascular Surgery | Admitting: Vascular Surgery

## 2017-02-10 VITALS — BP 177/112 | HR 49 | Temp 97.8°F | Resp 20 | Ht 63.0 in | Wt 164.0 lb

## 2017-02-10 DIAGNOSIS — I6523 Occlusion and stenosis of bilateral carotid arteries: Secondary | ICD-10-CM

## 2017-02-10 DIAGNOSIS — I6529 Occlusion and stenosis of unspecified carotid artery: Secondary | ICD-10-CM

## 2017-02-10 DIAGNOSIS — I6522 Occlusion and stenosis of left carotid artery: Secondary | ICD-10-CM | POA: Insufficient documentation

## 2017-02-10 DIAGNOSIS — I779 Disorder of arteries and arterioles, unspecified: Secondary | ICD-10-CM | POA: Insufficient documentation

## 2017-02-10 LAB — VAS US CAROTID
LEFT ECA DIAS: -28 cm/s
RCCAPDIAS: 19 cm/s
RCCAPSYS: 82 cm/s
RIGHT CCA MID DIAS: 19 cm/s
RIGHT ECA DIAS: -15 cm/s
Right cca dist sys: -124 cm/s

## 2017-02-10 MED ORDER — CLOPIDOGREL BISULFATE 75 MG PO TABS: 75.0000 mg | ORAL_TABLET | Freq: Every day | ORAL | 6 refills | Status: DC

## 2017-02-10 NOTE — Progress Notes (Signed)
Patient name: [name] MRN: [id number] DOB: 07-11-1961 Sex: female   REASON FOR CONSULT:    Greater than 80% right carotid stenosis with a known left internal carotid artery occlusion. Referred by Dr. Nona Dell.  HPI:   [name] is a 56 y.o. female who is referred for evaluation of bilateral carotid disease. Duplex scan showed an occluded left carotid artery with a greater than 80% right carotid stenosis. Of note this patient had a right carotid endarterectomy by Dr. Liliane Bade in the past likely about 10-12 years ago. She is on aspirin and is on a statin. She states that a week ago she had an episode of transient left arm weakness which lasted about 30 minutes. A year ago she had transient blindness which lasted about 30 minutes.  She states that she's had multiple heart attacks in the past but does not remember the dates. She denies any recent chest pain.  I reviewed the cardiac cath that was done on 08/07/2016. This showed moderate LV dysfunction with an ejection fraction of 45-50%. There was multivessel coronary artery disease with calcification of the proximal LAD with a 70% followed by a 60% stenosis between the first and second diagonal vessels with a segmental 50% stenosis in the mid to distal LAD. There was a 40-50% distal narrowing in the circumflex marginal branch and a total occlusion of the proximal right coronary artery with bridging collaterals. Aggressive medical therapy was recommended.   I did review Dr. Ival Bible note dated 12/08/2016. The patient is on aspirin and a statin for the multivessel coronary artery disease. The patient has a long-standing history of tobacco use. Patient also has a history of crack cocaine use but denies use in the last year. The patient has a 4.1 cm abdominal aortic aneurysm.  She has used crack cocaine in the past but tells me that she currently she is only using marijuana and not using crack cocaine.  Past Medical History:    Diagnosis Date  . AAA (abdominal aortic aneurysm) without rupture (HCC) 08/25/2016  . Anxiety   . Bipolar 1 disorder (HCC)   . CAD (coronary artery disease)    a. 08/08/16 LAD 70%, 60% 1st diag, CTO RCA Med Rx EF 55%  . Crack cocaine use   . Depression   . Hypertension   . Right Groin Hematoma    a. 07/2016 following diagnostic cath  . Stroke (HCC)   . Tobacco abuse     Family History  Problem Relation Age of Onset  . Heart attack Mother   . Stroke Mother   . Hypertension Mother   . Diabetes Mother   . Heart disease Mother   . Heart attack Father   . Hypertension Father   . Heart disease Father   . Cancer Maternal Aunt   . Cancer Maternal Uncle   . COPD Paternal Uncle        2 pat uncles with COPD  . Cancer Paternal Uncle        1 pat uncle with cancer    SOCIAL HISTORY: Social History   Social History  . Marital status: Single    Spouse name: N/A  . Number of children: N/A  . Years of education: N/A   Occupational History  . Not on file.   Social History Main Topics  . Smoking status: Current Every Day Smoker    Packs/day: 0.50    Years: 42.00    Types: Cigarettes  . Smokeless  tobacco: Never Used  . Alcohol use No  . Drug use: Yes    Types: Marijuana, "Crack" cocaine     Comment: last crack cocain use was 07-29-16. last marijuana use week of nov. 6, 2017  . Sexual activity: Not Currently   Other Topics Concern  . Not on file   Social History Narrative  . No narrative on file    Allergies  Allergen Reactions  . Penicillins Hives and Swelling    Has patient had a PCN reaction causing immediate rash, facial/tongue/throat swelling, SOB or lightheadedness with hypotension: Yes Has patient had a PCN reaction causing severe rash involving mucus membranes or skin necrosis: No Has patient had a PCN reaction that required hospitalization No Has patient had a PCN reaction occurring within the last 10 years: Non If all of the above answers are "NO", then  may proceed with Cephalosporin use.     Current Outpatient Prescriptions  Medication Sig Dispense Refill  . amLODipine (NORVASC) 10 MG tablet Take 1 tablet (10 mg total) by mouth daily. 90 tablet 1  . aspirin EC 81 MG tablet Take 81 mg by mouth daily.    Marland Kitchen atorvastatin (LIPITOR) 80 MG tablet Take 1 tablet (80 mg total) by mouth daily at 6 PM. 90 tablet 1  . lisinopril (PRINIVIL,ZESTRIL) 20 MG tablet Take 1 tablet (20 mg total) by mouth daily. 90 tablet 1  . metoprolol (LOPRESSOR) 100 MG tablet Take 1 tablet (100 mg total) by mouth 2 (two) times daily. 60 tablet 1  . nitroGLYCERIN (NITROSTAT) 0.4 MG SL tablet Place 1 tablet (0.4 mg total) under the tongue every 5 (five) minutes x 3 doses as needed for chest pain. 25 tablet 3  . clopidogrel (PLAVIX) 75 MG tablet Take 1 tablet (75 mg total) by mouth daily. 30 tablet 6   No current facility-administered medications for this visit.     REVIEW OF SYSTEMS:  [X]  denotes positive finding, [ ]  denotes negative finding Cardiac  Comments:  Chest pain or chest pressure:    Shortness of breath upon exertion: X   Short of breath when lying flat:    Irregular heart rhythm:        Vascular    Pain in calf, thigh, or hip brought on by ambulation: X   Pain in feet at night that wakes you up from your sleep:     Blood clot in your veins:    Leg swelling:  X       Pulmonary    Oxygen at home:    Productive cough:     Wheezing:         Neurologic    Sudden weakness in arms or legs:  X   Sudden numbness in arms or legs:     Sudden onset of difficulty speaking or slurred speech:    Temporary loss of vision in one eye:     Problems with dizziness:         Gastrointestinal    Blood in stool:     Vomited blood:         Genitourinary    Burning when urinating:     Blood in urine:        Psychiatric    Major depression:  X       Hematologic    Bleeding problems:    Problems with blood clotting too easily:        Skin    Rashes or  ulcers:  Constitutional    Fever or chills:     PHYSICAL EXAM:   Vitals:   02/10/17 1057 02/10/17 1101  BP: (!) 159/115 (!) 177/112  Pulse: (!) 49   Resp: 20   Temp: 97.8 F (36.6 C)   TempSrc: Oral   SpO2: 98%   Weight: 164 lb (74.4 kg)   Height: 5\' 3"  (1.6 m)     GENERAL: The patient is a well-nourished female, in no acute distress. The vital signs are documented above. CARDIAC: There is a regular rate and rhythm.  VASCULAR: She has a right carotid bruit. She has palpable femoral pulses and diminished but palpable dorsalis pedis pulses bilaterally. PULMONARY: There is good air exchange bilaterally without wheezing or rales. ABDOMEN: Soft and non-tender with normal pitched bowel sounds. Her aneurysm is nontender. MUSCULOSKELETAL: There are no major deformities or cyanosis. NEUROLOGIC: No focal weakness or paresthesias are detected. SKIN: There are no ulcers or rashes noted. PSYCHIATRIC: The patient has a normal affect.  DATA:    CAROTID DUPLEX: I have independently interpreted her carotid duplex scan. The left internal carotid artery is occluded as is the left common carotid artery. On the right side there is a greater than 80% stenosis in the proximal internal carotid artery.  MEDICAL ISSUES:   SYMPTOMATIC GREATER THAN 80% RIGHT CAROTID STENOSIS: This patient has an occluded left carotid artery with a greater than 80% recurrent right carotid stenosis. The patient previously had right carotid endarterectomy by Dr. Liliane BadeGreg Hayes. She had transient left arm weakness a week ago so I think this is symptomatic. She has significant coronary artery disease as documented above. She also has poorly controlled blood pressure. I will refer her to Dr. Nona DellSamuel McDowell for preoperative clearance and also control of her blood pressure. Given that she has a recurrent carotid stenosis, significant coronary artery disease I think this would best be addressed with carotid stenting if possible.  I will arrange for an office visit with Dr. Fabienne Brunsharles Fields are Dr. Durene CalWells Brabham to evaluate for possible carotid stenting. She is on aspirin and is on a statin. I have added Plavix today. She is also a smoker and we have discussed the importance of tobacco cessation.   Waverly Ferrariickson, [facility] [place] [facility] [phone]

## 2017-02-10 NOTE — Telephone Encounter (Signed)
Spoke with the patient regarding the appts Dr.Dickson asked me to schedule. She is scheduled to see Dr.McDowell for cardiac clearance on 03/12/17 at 1:40pm in [place] office. [phone] And then to see Dr.Brabham here on 03/15/17 at 11am. She is aware of both appts. I also mailed a letter with the above information. awt

## 2017-02-25 ENCOUNTER — Ambulatory Visit: Payer: Self-pay | Admitting: Physician Assistant

## 2017-03-08 ENCOUNTER — Other Ambulatory Visit: Payer: Self-pay | Admitting: Physician Assistant

## 2017-03-08 ENCOUNTER — Encounter: Payer: Self-pay | Admitting: Surgery

## 2017-03-09 ENCOUNTER — Encounter: Payer: Self-pay | Admitting: Physician Assistant

## 2017-03-09 ENCOUNTER — Ambulatory Visit: Payer: Self-pay | Admitting: Physician Assistant

## 2017-03-09 VITALS — BP 212/140 | HR 72 | Temp 97.5°F | Ht 63.0 in | Wt 168.8 lb

## 2017-03-09 DIAGNOSIS — F39 Unspecified mood [affective] disorder: Secondary | ICD-10-CM

## 2017-03-09 DIAGNOSIS — Z9119 Patient's noncompliance with other medical treatment and regimen: Secondary | ICD-10-CM

## 2017-03-09 DIAGNOSIS — I10 Essential (primary) hypertension: Secondary | ICD-10-CM

## 2017-03-09 DIAGNOSIS — F1410 Cocaine abuse, uncomplicated: Secondary | ICD-10-CM

## 2017-03-09 DIAGNOSIS — Z91199 Patient's noncompliance with other medical treatment and regimen due to unspecified reason: Secondary | ICD-10-CM

## 2017-03-09 DIAGNOSIS — E785 Hyperlipidemia, unspecified: Secondary | ICD-10-CM

## 2017-03-09 DIAGNOSIS — I25110 Atherosclerotic heart disease of native coronary artery with unstable angina pectoris: Secondary | ICD-10-CM

## 2017-03-09 DIAGNOSIS — I739 Peripheral vascular disease, unspecified: Secondary | ICD-10-CM

## 2017-03-09 DIAGNOSIS — I714 Abdominal aortic aneurysm, without rupture, unspecified: Secondary | ICD-10-CM

## 2017-03-09 DIAGNOSIS — I779 Disorder of arteries and arterioles, unspecified: Secondary | ICD-10-CM

## 2017-03-09 NOTE — Progress Notes (Signed)
BP (!) 234/120 (BP Location: Left Arm, Patient Position: Sitting, Cuff Size: Normal)   Pulse 72   Temp 97.5 F (36.4 C)   Ht 5\' 3"  (1.6 m)   Wt 168 lb 12 oz (76.5 kg)   SpO2 96%   BMI 29.89 kg/m    Subjective:    Patient ID: [name], female    DOB: 02/26/1961, 56 y.o.   MRN: [id number]  HPI: [name] LoftyDebra G Graul is a 56 y.o. female presenting on 03/09/2017 for Hypertension   HPI  Pt way past due for appt.  She was due to return in march.   Pt is feeling well today.  She Says she doesn't understand why her bp is up today.  Pt says she has not run out of any of her bp meds.  She took her doses this morning about 9am.    She is still using cocaine sometimes.  Most recently on Sunday.   Pt turned in her [facility] discount application in feb or march but says she hasn't heard anything on it.    Pt is Going to cardiology on Friday and surgeon on Monday  Pt not taking her plavix- she says she cannot afford and it is not available through [facility].  Effient, a similar medication is available through [facility].   Pt never got labs drawn that were ordered in February.   Pt is not going anywhere for counseling.    Relevant past medical, surgical, family and social history reviewed and updated as indicated. Interim medical history since our last visit reviewed. Allergies and medications reviewed and updated.   Current Outpatient Prescriptions:  .  amLODipine (NORVASC) 10 MG tablet, Take 1 tablet (10 mg total) by mouth daily., Disp: 90 tablet, Rfl: 1 .  aspirin EC 81 MG tablet, Take 81 mg by mouth daily., Disp: , Rfl:  .  atorvastatin (LIPITOR) 80 MG tablet, Take 1 tablet (80 mg total) by mouth daily at 6 PM., Disp: 90 tablet, Rfl: 1 .  lisinopril (PRINIVIL,ZESTRIL) 20 MG tablet, Take 1 tablet (20 mg total) by mouth daily., Disp: 90 tablet, Rfl: 1 .  metoprolol (LOPRESSOR) 100 MG tablet, Take 1 tablet (100 mg total) by mouth 2 (two) times daily., Disp: 60 tablet, Rfl: 1 .   nitroGLYCERIN (NITROSTAT) 0.4 MG SL tablet, Place 1 tablet (0.4 mg total) under the tongue every 5 (five) minutes x 3 doses as needed for chest pain., Disp: 25 tablet, Rfl: 3 .  clopidogrel (PLAVIX) 75 MG tablet, Take 1 tablet (75 mg total) by mouth daily. (Patient not taking: Reported on 03/09/2017), Disp: 30 tablet, Rfl: 6  Review of Systems  Constitutional: Negative for appetite change, chills, diaphoresis, fatigue, fever and unexpected weight change.  HENT: Negative for congestion, drooling, ear pain, facial swelling, hearing loss, mouth sores, sneezing, sore throat, trouble swallowing and voice change.   Eyes: Negative for pain, discharge, redness, itching and visual disturbance.  Respiratory: Negative for cough, choking, shortness of breath and wheezing.   Cardiovascular: Positive for leg swelling. Negative for chest pain and palpitations.  Gastrointestinal: Positive for constipation. Negative for abdominal pain, blood in stool, diarrhea and vomiting.  Endocrine: Negative for cold intolerance, heat intolerance and polydipsia.  Genitourinary: Negative for decreased urine volume, dysuria and hematuria.  Musculoskeletal: Positive for arthralgias. Negative for back pain and gait problem.  Skin: Negative for rash.  Allergic/Immunologic: Negative for environmental allergies.  Neurological: Negative for seizures, syncope, light-headedness and headaches.  Hematological: Negative for adenopathy.  Psychiatric/Behavioral: Positive for dysphoric mood. Negative for agitation and suicidal ideas. The patient is nervous/anxious.     Per HPI unless specifically indicated above     Objective:    BP (!) 234/120 (BP Location: Left Arm, Patient Position: Sitting, Cuff Size: Normal)   Pulse 72   Temp 97.5 F (36.4 C)   Ht 5\' 3"  (1.6 m)   Wt 168 lb 12 oz (76.5 kg)   SpO2 96%   BMI 29.89 kg/m   Wt Readings from Last 3 Encounters:  03/09/17 168 lb 12 oz (76.5 kg)  02/10/17 164 lb (74.4 kg)  12/08/16  167 lb (75.8 kg)    Physical Exam  Constitutional: She is oriented to person, place, and time. She appears well-developed and well-nourished.  HENT:  Head: Normocephalic and atraumatic.  Neck: Neck supple.  Cardiovascular: Normal rate and regular rhythm.   Pulmonary/Chest: Effort normal and breath sounds normal.  Abdominal: Soft. Bowel sounds are normal. She exhibits no mass. There is no hepatosplenomegaly. There is no tenderness.  Musculoskeletal: She exhibits no edema.  Lymphadenopathy:    She has no cervical adenopathy.  Neurological: She is alert and oriented to person, place, and time.  Skin: Skin is warm and dry.  Psychiatric: She has a normal mood and affect. Her behavior is normal.  Vitals reviewed.       Assessment & Plan:    Encounter Diagnoses  Name Primary?  . Essential hypertension Yes  . Coronary artery disease involving native coronary artery of native heart with unstable angina pectoris (HCC)   . Carotid disease, bilateral (HCC)   . AAA (abdominal aortic aneurysm) without rupture (HCC)   . Dyslipidemia   . Cocaine abuse   . Personal history of noncompliance with medical treatment, presenting hazards to health   . Mood disorder (HCC)     -discussed with pt that she needs to go to the ER for elevated BP.  We are in [place] today and have no medications to give at this office.  Offered to call ambulance but pt refuses ambulance.  Told pt that [name] or [facility] either one would be fine but she has turned in financial assistance form at [facility]  She says she will get her friend to take her.  -Gave pt the phone number for [facility] financial counselor.  -recommended pt go to [facility] for MH.  -pt still has handout on [facility] -we will contact dr Adele Dan office to see if he is okay with using effient instead of plavix -pt counseled that she still needs to get her labs drawn that were ordered in february -pt to follow up on Monday to recheck the BP

## 2017-03-11 NOTE — Progress Notes (Signed)
Cardiology Office Note  Date: 03/12/2017   ID: [name], DOB May 22, 1961, MRN [id number]  PCP: Jacquelin Hawking, PA-C  Primary Cardiologist: Nona Dell, MD   Chief Complaint  Patient presents with  . Preoperative evaluation    History of Present Illness: [name] is a medically complex and overall high risk 56 y.o. female last seen in the office in March - history is well detailed in the previous note. She has since been evaluated by Dr. Edilia Bo with findings of symptomatic carotid artery disease, specifically occluded LICA and greater than 80% RICA stenosis with previous right carotid endarterectomy. She is felt to be best served with right carotid artery stenting and is referred back to the office for preoperative cardiac assessment.  She presents today stating that she has taken one nitroglycerin since I saw her last.  I reviewed her current medications which are outlined below. She tells me that she has not been able to take Plavix at all, it was apparently not available through [facility].  Cardiac catheterization and echocardiogram from November 2017 are outlined below. She has multivessel CAD that was managed medically, and had normal LVEF by echocardiography.  She saw her PCP recently on June 12 for follow-up of blood pressure. I reviewed the note and there is mention that the patient is still using cocaine. Blood pressure was 234/120 at that visit, Ms. McElroy PA-C recommended that the patient be seen in the ER. She tells me that she "slipped up" and used cocaine only once around that time. Her blood pressure is better controlled today although not optimal. She does not report any focal neurological symptoms.  Past Medical History:  Diagnosis Date  . AAA (abdominal aortic aneurysm) without rupture (HCC) 08/25/2016  . Anxiety   . Bipolar 1 disorder (HCC)   . CAD (coronary artery disease)    a. 08/08/16 LAD 70%, 60% 1st diag, CTO RCA Med Rx EF 55%  . Crack  cocaine use   . Depression   . Hypertension   . Right Groin Hematoma    a. 07/2016 following diagnostic cath  . Stroke (HCC)   . Tobacco abuse     Past Surgical History:  Procedure Laterality Date  . CARDIAC CATHETERIZATION N/A 08/07/2016   Procedure: Left Heart Cath and Coronary Angiography;  Surgeon: Lennette Bihari, MD;  Location: [facility] INVASIVE CV LAB;  Service: Cardiovascular;  Laterality: N/A;  . CYST REMOVAL TRUNK Left    beneath L breast  . TUBAL LIGATION    . VASCULAR SURGERY Right    R neck    Current Outpatient Prescriptions  Medication Sig Dispense Refill  . amLODipine (NORVASC) 10 MG tablet Take 1 tablet (10 mg total) by mouth daily. 90 tablet 1  . aspirin EC 81 MG tablet Take 81 mg by mouth daily.    Marland Kitchen atorvastatin (LIPITOR) 80 MG tablet Take 1 tablet (80 mg total) by mouth daily at 6 PM. 90 tablet 1  . lisinopril (PRINIVIL,ZESTRIL) 20 MG tablet Take 1 tablet (20 mg total) by mouth daily. 90 tablet 1  . metoprolol (LOPRESSOR) 100 MG tablet Take 1 tablet (100 mg total) by mouth 2 (two) times daily. 60 tablet 1  . nitroGLYCERIN (NITROSTAT) 0.4 MG SL tablet Place 1 tablet (0.4 mg total) under the tongue every 5 (five) minutes x 3 doses as needed for chest pain. 25 tablet 3  . clopidogrel (PLAVIX) 75 MG tablet Take 1 tablet (75 mg total) by mouth daily. (Patient not  taking: Reported on 03/09/2017) 30 tablet 6   No current facility-administered medications for this visit.    Allergies:  Penicillins   Social History: The patient  reports that she has been smoking Cigarettes.  She has a 21.00 pack-year smoking history. She has never used smokeless tobacco. She reports that she uses drugs, including Marijuana and "Crack" cocaine. She reports that she does not drink alcohol.   Family History: The patient's family history includes COPD in her paternal uncle; Cancer in her maternal aunt, maternal uncle, and paternal uncle; Diabetes in her mother; Heart attack in her father and  mother; Heart disease in her father and mother; Hypertension in her father and mother; Stroke in her mother.   ROS:  Please see the history of present illness. Otherwise, complete review of systems is positive for intermittent angina.  All other systems are reviewed and negative.   Physical Exam: VS:  BP (!) 164/97   Pulse (!) 49   Ht 5\' 4"  (1.626 m)   Wt 168 lb 12.8 oz (76.6 kg)   SpO2 99%   BMI 28.97 kg/m , BMI Body mass index is 28.97 kg/m.  Wt Readings from Last 3 Encounters:  03/12/17 168 lb 12.8 oz (76.6 kg)  03/09/17 168 lb 12 oz (76.5 kg)  02/10/17 164 lb (74.4 kg)    General: Patient appears comfortable at rest. HEENT: Conjunctiva and lids normal, oropharynx clear with poor dentition. Neck: Supple, no elevated JVP, prominent right carotid bruit, no thyromegaly. Lungs: Decreased breath sounds without wheezing, nonlabored breathing at rest. Cardiac: Regular rate and rhythm, no S3, 2/6 basal systolic murmur, no pericardial rub. Abdomen: Soft, nontender, bowel sounds present, no guarding or rebound. Extremities: No pitting edema, distal pulses 1+. Skin: Warm and dry. Musculoskeletal: No kyphosis. Neuropsychiatric: Alert and oriented x3, affect grossly appropriate.  ECG: I personally reviewed the tracing from 08/25/2016 which showed sinus rhythm with anterior and possibly old inferior infarct pattern.  Recent Labwork: 08/06/2016: ALT 15; AST 21; TSH 0.490 08/25/2016: BUN 15; Creatinine, Ser 0.66; Hemoglobin 14.0; Platelets 446; Potassium 3.6; Sodium 136     Component Value Date/Time   CHOL 201 (H) 08/07/2016 0255   TRIG 222 (H) 08/07/2016 0255   HDL 37 (L) 08/07/2016 0255   CHOLHDL 5.4 08/07/2016 0255   VLDL 44 (H) 08/07/2016 0255   LDLCALC 120 (H) 08/07/2016 0255    Other Studies Reviewed Today:  Echocardiogram 08/08/2016: Study Conclusions  - Left ventricle: The cavity size was normal. There was mild concentric hypertrophy. Systolic function was normal.  The estimated ejection fraction was in the range of 55% to 60%. Wall motion was normal; there were no regional wall motion abnormalities. Doppler parameters are consistent with abnormal left ventricular relaxation (grade 1 diastolic dysfunction). - Left atrium: The atrium was mildly dilated. - Atrial septum: No defect or patent foramen ovale was identified.  Cardiac catheterization 08/07/2016:  There is mild left ventricular systolic dysfunction.  The left ventricular ejection fraction is 45-50% by visual estimate.  There is no mitral valve regurgitation.  LV end diastolic pressure is mildly elevated.  Mid RCA lesion, 100 %stenosed.  Dist RCA lesion, 75 %stenosed.  Mid LAD-2 lesion, 60 %stenosed.  Mid LAD-1 lesion, 70 %stenosed.  Dist LAD-2 lesion, 45 %stenosed.  Dist LAD-1 lesion, 50 %stenosed.  2nd Mrg lesion, 40 %stenosed.  Moderate LV dysfunction with upper mid to basal inferior hypocontractility. Global ejection fraction is 45-50%.  Multivessel CAD with calcification of the proximal LAD with 70% followed  by 60% stenoses between the first and second diagonal vessel with segmental 50% stenoses in the mid distal LAD; 40-50% diffuse narrowing in the distal circumflex marginal branch; an old total occlusion of the proximal RCA with bridging collaterals.  Assessment and Plan:  1. Preoperative evaluation prior to anticipated right carotid stent due to severe symptomatic ICA disease. It would be hard to characterize Ms. Hilyard's periprocedural risk as anything other than high in light of her comorbidities, however choice of carotid stenting as opposed to an open vascular surgery would be preferable. Unfortunately, she has not been able to take Plavix, states that she cannot afford it, and it is apparently not available through [facility]. I asked nursing to check with that service to see if there were other antiplatelet drugs available, it may be that she will need to  be given samples of Brilinta, although will defer choice of agent to [facility]. She apparently has a visit this coming Monday for further discussion.  2. Multivessel CAD by cardiac catheterization in November 2017, managed medically in light of complex comorbidities including substance abuse and noncompliance. Fortunately, she is not reporting progressive angina symptoms. I reinforced compliance with her current medications, she states that she has them all available at this time other than Plavix as mentioned above.  3. Essential hypertension. She is now on Norvasc, lisinopril, and Lopressor. Blood pressure trend is better although not optimal. Lisinopril dose could be further up titrated next. I reinforced compliance.  3. History of substance abuse including crack cocaine and marijuana. I reinforced the critical importance to her health of complete cessation.  4. Tobacco abuse ongoing, smoking cessation discussed.  Current medicines were reviewed with the patient today.  Disposition: Follow-up in 3 months.  Signed, Jonelle Sidle, MD, [facility] 03/12/2017 2:13 PM    [facility] [address] [place], [place], [place] [address] Phone: [phone]; Fax: [phone]

## 2017-03-12 ENCOUNTER — Ambulatory Visit (INDEPENDENT_AMBULATORY_CARE_PROVIDER_SITE_OTHER): Payer: Self-pay | Admitting: Cardiology

## 2017-03-12 ENCOUNTER — Encounter: Payer: Self-pay | Admitting: Cardiology

## 2017-03-12 VITALS — BP 164/97 | HR 49 | Ht 64.0 in | Wt 168.8 lb

## 2017-03-12 DIAGNOSIS — I10 Essential (primary) hypertension: Secondary | ICD-10-CM

## 2017-03-12 DIAGNOSIS — I739 Peripheral vascular disease, unspecified: Secondary | ICD-10-CM

## 2017-03-12 DIAGNOSIS — Z720 Tobacco use: Secondary | ICD-10-CM

## 2017-03-12 DIAGNOSIS — I25119 Atherosclerotic heart disease of native coronary artery with unspecified angina pectoris: Secondary | ICD-10-CM

## 2017-03-12 DIAGNOSIS — F1910 Other psychoactive substance abuse, uncomplicated: Secondary | ICD-10-CM

## 2017-03-12 DIAGNOSIS — Z01810 Encounter for preprocedural cardiovascular examination: Secondary | ICD-10-CM

## 2017-03-12 DIAGNOSIS — I779 Disorder of arteries and arterioles, unspecified: Secondary | ICD-10-CM

## 2017-03-12 NOTE — Patient Instructions (Signed)
Medication Instructions:  Continue all current medications.  Labwork: none  Testing/Procedures: none  Follow-Up: 3 months   Any Other Special Instructions Will Be Listed Below (If Applicable).  If you need a refill on your cardiac medications before your next appointment, please call your pharmacy.  

## 2017-03-15 ENCOUNTER — Ambulatory Visit: Payer: Self-pay | Admitting: Physician Assistant

## 2017-03-15 ENCOUNTER — Ambulatory Visit (INDEPENDENT_AMBULATORY_CARE_PROVIDER_SITE_OTHER): Payer: Self-pay | Admitting: Surgery

## 2017-03-15 ENCOUNTER — Encounter: Payer: Self-pay | Admitting: Surgery

## 2017-03-15 ENCOUNTER — Encounter: Payer: Self-pay | Admitting: Physician Assistant

## 2017-03-15 VITALS — BP 208/120 | HR 52

## 2017-03-15 VITALS — BP 196/97 | HR 50 | Temp 97.6°F | Resp 20 | Ht 64.0 in | Wt 167.6 lb

## 2017-03-15 DIAGNOSIS — I25110 Atherosclerotic heart disease of native coronary artery with unstable angina pectoris: Secondary | ICD-10-CM

## 2017-03-15 DIAGNOSIS — I739 Peripheral vascular disease, unspecified: Secondary | ICD-10-CM

## 2017-03-15 DIAGNOSIS — I779 Disorder of arteries and arterioles, unspecified: Secondary | ICD-10-CM

## 2017-03-15 DIAGNOSIS — F1410 Cocaine abuse, uncomplicated: Secondary | ICD-10-CM

## 2017-03-15 DIAGNOSIS — F17218 Nicotine dependence, cigarettes, with other nicotine-induced disorders: Secondary | ICD-10-CM

## 2017-03-15 DIAGNOSIS — I10 Essential (primary) hypertension: Secondary | ICD-10-CM

## 2017-03-15 DIAGNOSIS — I6521 Occlusion and stenosis of right carotid artery: Secondary | ICD-10-CM

## 2017-03-15 MED ORDER — METOPROLOL TARTRATE 100 MG PO TABS: 200.0000 mg | ORAL_TABLET | Freq: Two times a day (BID) | ORAL | 1 refills | Status: DC

## 2017-03-15 MED ORDER — LISINOPRIL 20 MG PO TABS
40.0000 mg | ORAL_TABLET | Freq: Every day | ORAL | 1 refills | Status: DC
Start: 2017-03-15 — End: 2017-07-06

## 2017-03-15 MED ORDER — PRASUGREL HCL 10 MG PO TABS: 10.0000 mg | ORAL_TABLET | Freq: Every day | ORAL | 1 refills | Status: DC

## 2017-03-15 NOTE — Progress Notes (Addendum)
[facility] [place]  Patient name: [name] LoftyDebra G Wyszynski MRN: [id number] DOB: 12/12/1960 Sex: female   REASON FOR VISIT:    Carotid stenosis  HISOTRY OF PRESENT ILLNESS:    [name] LoftyDebra G Belmonte is a 56 y.o. female who comes in today for discussions of possible carotid stenting.  She is status post right carotid endarterectomy by Dr. Madilyn FiremanHayes in the remote past.  She has a known left carotid occlusion.  Her most recent ultrasound reveals greater than 80% right carotid stenosis.  She also reports having had an episode of left arm numbness and weakness that lasted about 30 minutes and then resolved.  She does reveal a episode of amaurosis approximately a year ago.  The patient has a significant coronary history with previous PCI.  There is a significant family history of stroke.  She takes a statin for hypercholesterolemia.  She has a 4.1 cm abdominal aortic aneurysm.  She has a history of drug abuse.  The patient was unable to get a prescription for Plavix secondary to financial reasons.   PAST MEDICAL HISTORY:   Past Medical History:  Diagnosis Date  . AAA (abdominal aortic aneurysm) without rupture (HCC) 08/25/2016  . Anxiety   . Bipolar 1 disorder (HCC)   . CAD (coronary artery disease)    a. 08/08/16 LAD 70%, 60% 1st diag, CTO RCA Med Rx EF 55%  . Crack cocaine use   . Depression   . Hypertension   . Right Groin Hematoma    a. 07/2016 following diagnostic cath  . Stroke (HCC)   . Tobacco abuse      FAMILY HISTORY:   Family History  Problem Relation Age of Onset  . Heart attack Mother   . Stroke Mother   . Hypertension Mother   . Diabetes Mother   . Heart disease Mother   . Heart attack Father   . Hypertension Father   . Heart disease Father   . Cancer Maternal Aunt   . Cancer Maternal Uncle   . COPD Paternal Uncle        2 pat uncles with COPD  . Cancer Paternal Uncle        1 pat uncle with cancer    SOCIAL  HISTORY:   Social History  Substance Use Topics  . Smoking status: Current Every Day Smoker    Packs/day: 0.50    Years: 42.00    Types: Cigarettes  . Smokeless tobacco: Never Used     Comment: 10 cigarettes per day  . Alcohol use No     ALLERGIES:   Allergies  Allergen Reactions  . Penicillins Hives and Swelling    Has patient had a PCN reaction causing immediate rash, facial/tongue/throat swelling, SOB or lightheadedness with hypotension: Yes Has patient had a PCN reaction causing severe rash involving mucus membranes or skin necrosis: No Has patient had a PCN reaction that required hospitalization No Has patient had a PCN reaction occurring within the last 10 years: Non If all of the above answers are "NO", then may proceed with Cephalosporin use.      CURRENT MEDICATIONS:   Current Outpatient Prescriptions  Medication Sig Dispense Refill  . amLODipine (NORVASC) 10 MG tablet Take 1 tablet (10 mg total) by mouth daily. 90 tablet 1  . aspirin EC 81 MG tablet Take 81 mg by mouth daily.    Marland Kitchen. atorvastatin (LIPITOR) 80 MG tablet Take 1 tablet (80 mg total) by mouth daily at 6 PM. 90  tablet 1  . clopidogrel (PLAVIX) 75 MG tablet Take 1 tablet (75 mg total) by mouth daily. 30 tablet 6  . lisinopril (PRINIVIL,ZESTRIL) 20 MG tablet Take 1 tablet (20 mg total) by mouth daily. 90 tablet 1  . metoprolol (LOPRESSOR) 100 MG tablet Take 1 tablet (100 mg total) by mouth 2 (two) times daily. 60 tablet 1  . nitroGLYCERIN (NITROSTAT) 0.4 MG SL tablet Place 1 tablet (0.4 mg total) under the tongue every 5 (five) minutes x 3 doses as needed for chest pain. 25 tablet 3   No current facility-administered medications for this visit.     REVIEW OF SYSTEMS:   [X]  denotes positive finding, [ ]  denotes negative finding Cardiac  Comments:  Chest pain or chest pressure:    Shortness of breath upon exertion:    Short of breath when lying flat:    Irregular heart rhythm:        Vascular      Pain in calf, thigh, or hip brought on by ambulation: x   Pain in feet at night that wakes you up from your sleep:     Blood clot in your veins:    Leg swelling:  x       Pulmonary    Oxygen at home:    Productive cough:     Wheezing:         Neurologic    Sudden weakness in arms or legs:  x   Sudden numbness in arms or legs:  x   Sudden onset of difficulty speaking or slurred speech:    Temporary loss of vision in one eye:     Problems with dizziness:         Gastrointestinal    Blood in stool:     Vomited blood:         Genitourinary    Burning when urinating:     Blood in urine:        Psychiatric    Major depression:         Hematologic    Bleeding problems:    Problems with blood clotting too easily:        Skin    Rashes or ulcers:        Constitutional    Fever or chills:      PHYSICAL EXAM:   Vitals:   03/15/17 1054 03/15/17 1100  BP: (!) 195/100 (!) 196/97  Pulse: (!) 50   Resp: 20   Temp: 97.6 F (36.4 C)   TempSrc: Oral   SpO2: 97%   Weight: 167 lb 9.6 oz (76 kg)   Height: 5\' 4"  (1.626 m)     GENERAL: The patient is a well-nourished female, in no acute distress. The vital signs are documented above. CARDIAC: There is a regular rate and rhythm.  VASCULAR: Palpable pedal pulses.  No carotid bruits. PULMONARY: Non-labored respirations MUSCULOSKELETAL: There are no major deformities or cyanosis. NEUROLOGIC: No focal weakness or paresthesias are detected. SKIN: There are no ulcers or rashes noted. PSYCHIATRIC: The patient has a normal affect.  STUDIES:   I have reviewed her carotid duplex which shows left carotid occlusion and greater than 80% right carotid stenosis  MEDICAL ISSUES:   Symptomatic right carotid stenosis: We are considering carotid stenting given the fact that she has previously undergone right carotid endarterectomy.  Unfortunately, the patient has not been able to afford Plavix.  She states that she will be able to get this  medication starting July  1.  I would like for her to be on this as well as a baby aspirin for at least a week prior to her procedure.  Unfortunately, I am unavailable at that time and therefore I'm trying to get her scheduled with Dr. Darrick Penna.  We discussed the risks and benefits of the procedure.  She does have a history of a right femoral hematoma following catheterization in 2017.  She knows there is a risk for bleeding.  We also discussed the risk of stroke.  We also discussed that this may not be technically possible and she would need surgery for redo endarterectomy.  All of her questions were answered.  I did tell her that given her financial constraints, we could potentially have her stop her Plavix for 6 weeks following stent insertion.  Addendum: I just spoke with the free clinic.  The patient will be able to get Effient for free through their services.  I told her that that would be an acceptable alternative to Plavix.  If still be 1-2 weeks before she can acquire the medication.  Durene Cal, MD [facility] [phone] Pager [phone]

## 2017-03-15 NOTE — Progress Notes (Signed)
BP (!) 216/133   Pulse (!) 52    Subjective:    Patient ID: [name], female    DOB: 10/26/1960, 56 y.o.   MRN: [id number]  HPI: [name] is a 56 y.o. female presenting on 03/15/2017 for Hypertension   HPI  Pt did not go to ER for elevated BP after last OV as recommended  We have called surgeon's office multiple times about taking effient in lieu of plavix and they have not called [place] back.   Pt had appointment this morning with the surgeon and pt says he didn't mention any alternatives.   Pt says she smoked cigarettes and drank mt dew all the way from the surgeon's office to our office.  She says she is feeling very anxious.   Pt has been taking 2 metoprolol bid since her OV last week (ie 200mg  bid)   Relevant past medical, surgical, family and social history reviewed and updated as indicated. Interim medical history since our last visit reviewed. Allergies and medications reviewed and updated.   Current Outpatient Prescriptions:  .  amLODipine (NORVASC) 10 MG tablet, Take 1 tablet (10 mg total) by mouth daily., Disp: 90 tablet, Rfl: 1 .  aspirin EC 81 MG tablet, Take 81 mg by mouth daily., Disp: , Rfl:  .  atorvastatin (LIPITOR) 80 MG tablet, Take 1 tablet (80 mg total) by mouth daily at 6 PM., Disp: 90 tablet, Rfl: 1 .  lisinopril (PRINIVIL,ZESTRIL) 20 MG tablet, Take 1 tablet (20 mg total) by mouth daily., Disp: 90 tablet, Rfl: 1 .  metoprolol (LOPRESSOR) 100 MG tablet, Take 1 tablet (100 mg total) by mouth 2 (two) times daily., Disp: 60 tablet, Rfl: 1 .  nitroGLYCERIN (NITROSTAT) 0.4 MG SL tablet, Place 1 tablet (0.4 mg total) under the tongue every 5 (five) minutes x 3 doses as needed for chest pain., Disp: 25 tablet, Rfl: 3 .  clopidogrel (PLAVIX) 75 MG tablet, Take 1 tablet (75 mg total) by mouth daily. (Patient not taking: Reported on 03/15/2017), Disp: 30 tablet, Rfl: 6   Review of Systems  Constitutional: Positive for diaphoresis. Negative for appetite  change, chills, fatigue, fever and unexpected weight change.  HENT: Positive for congestion. Negative for dental problem, drooling, ear pain, facial swelling, hearing loss, mouth sores, sneezing, sore throat, trouble swallowing and voice change.   Eyes: Negative for pain, discharge, redness, itching and visual disturbance.  Respiratory: Negative for cough, choking, shortness of breath and wheezing.   Cardiovascular: Negative for chest pain, palpitations and leg swelling.  Gastrointestinal: Negative for abdominal pain, blood in stool, constipation, diarrhea and vomiting.  Endocrine: Negative for cold intolerance, heat intolerance and polydipsia.  Genitourinary: Negative for decreased urine volume, dysuria and hematuria.  Musculoskeletal: Negative for arthralgias, back pain and gait problem.  Skin: Negative for rash.  Allergic/Immunologic: Negative for environmental allergies.  Neurological: Negative for seizures, syncope, light-headedness and headaches.  Hematological: Negative for adenopathy.  Psychiatric/Behavioral: Positive for dysphoric mood. Negative for agitation and suicidal ideas. The patient is nervous/anxious.     Per HPI unless specifically indicated above     Objective:    BP (!) 216/133   Pulse (!) 52   Wt Readings from Last 3 Encounters:  03/15/17 167 lb 9.6 oz (76 kg)  03/12/17 168 lb 12.8 oz (76.6 kg)  03/09/17 168 lb 12 oz (76.5 kg)    Physical Exam  Constitutional: She is oriented to person, place, and time. She appears well-developed and well-nourished.  HENT:  Head: Normocephalic and atraumatic.  Neck: Neck supple.  Cardiovascular: Normal rate and regular rhythm.   Pulmonary/Chest: Effort normal and breath sounds normal.  Abdominal: Soft. Bowel sounds are normal. She exhibits no mass. There is no hepatosplenomegaly. There is no tenderness.  Musculoskeletal: She exhibits no edema.  Lymphadenopathy:    She has no cervical adenopathy.  Neurological: She is alert  and oriented to person, place, and time.  Skin: Skin is warm and dry.  Psychiatric: She has a normal mood and affect. Her behavior is normal.  Vitals reviewed.       Assessment & Plan:   Encounter Diagnoses  Name Primary?  . Essential hypertension Yes  . Carotid disease, bilateral (HCC)   . Coronary artery disease involving native coronary artery of native heart with unstable angina pectoris (HCC)   . Cigarette nicotine dependence with other nicotine-induced disorder   . Cocaine abuse     -I spoke with dr Myra GianottiBrabham (vascular surgeon) and he agreed that effient would be an acceptable alternative to plavix which the pt cannot afford.  rx sent to [facility] -Increase lisinopril to 40mg . Continue metoprolol 200mg  bid -discussed with pt that smoking is bad for her blood pressure as well as her carotid stenosis. Discussed smoking cessation.   Also mt dew should be avoided due to high caffeine content.  Discussed that she should try to relax, meditate to try to help avoid elevations in BP .  Pt reminded that she MUST avoid cocaine which would likely kill her due to her medical conditions -will follow up in 3 weeks to recheck BP (that is the next time we are in [place] office and she wants to be seen there)

## 2017-03-16 ENCOUNTER — Other Ambulatory Visit: Payer: Self-pay | Admitting: *Deleted

## 2017-03-23 ENCOUNTER — Other Ambulatory Visit: Payer: Self-pay

## 2017-03-23 ENCOUNTER — Telehealth: Payer: Self-pay

## 2017-03-23 ENCOUNTER — Telehealth: Payer: Self-pay | Admitting: Vascular Surgery

## 2017-03-23 DIAGNOSIS — Z01810 Encounter for preprocedural cardiovascular examination: Secondary | ICD-10-CM

## 2017-03-23 DIAGNOSIS — I6521 Occlusion and stenosis of right carotid artery: Secondary | ICD-10-CM

## 2017-03-23 NOTE — Telephone Encounter (Signed)
[facility] 04/07/17 at 1:00. Sched MD 04/15/17 at 11:00. Lm on friend's # as per pt's request.

## 2017-03-23 NOTE — Telephone Encounter (Signed)
-----   Message from Phillips Odorarol S Pullins, RN sent at 03/23/2017  9:27 AM EDT ----- Regarding: FW: Carotid Stent Contact: [phone] Please schedule CTA of head and neck and appt. With Dr. Darrick PennaFields within 2 weeks.  Pt. saw [facility] 6/18; has >80% right carotid stenosis; per Dr. Darrick PennaFields recommendation, needs the above, to see if candidate for TCAR procedure.  Contact pt. at her friend's number above. (her name is Therapist, nutritionalDebbie)    ----- Message ----- From: Sherren KernsFields, Charles E, MD Sent: 03/23/2017   8:18 AM To: Phillips Odorarol S Pullins, RN Subject: RE: Carotid Stent                              She may be a candidate for TCAR.  She will need CTA of head and neck and appt with me prior.  If she does not meet TCAR criteria we can talk to Allyson SabalBerry about traditional carotid stent  Fabienne Brunsharles Fields ----- Message ----- From: Phillips OdorPullins, Carol S, RN Sent: 03/22/2017  11:12 AM To: Sherren Kernsharles E Fields, MD Subject: Carotid Stent                                  This pt. saw Dr. Myra GianottiBrabham last week on 6/18.  He is requesting to schedule a right Carotid Stent with you.  She had a Right CEA by Dr. Madilyn FiremanHayes in the past, and now has > 80 % right carotid stenosis.  With the new procedure, TCAR, I wanted to find out about proceeding with scheduling the carotid stent in the [facility] lab with Dr. Allyson SabalBerry.  The pt. Has to wait until after July 1st due to her finances, and being able to afford Plavix.  I am looking at possible dates: 7/10 ([facility] gone), or 7/20; although I haven't reached out to Dr. Eugene GaviaBerry's Scheduler yet, re: dates he is available.

## 2017-03-23 NOTE — Telephone Encounter (Signed)
-----   Message from Sherren Kernsharles E Fields, MD sent at 03/23/2017  8:18 AM EDT ----- Regarding: RE: Carotid Stent She may be a candidate for TCAR.  She will need CTA of head and neck and appt with me prior.  If she does not meet TCAR criteria we can talk to Allyson SabalBerry about traditional carotid stent  Fabienne Brunsharles Fields ----- Message ----- From: Phillips OdorPullins, Gerren Hoffmeier S, RN Sent: 03/22/2017  11:12 AM To: Sherren Kernsharles E Fields, MD Subject: Carotid Stent                                  This pt. saw Dr. Myra GianottiBrabham last week on 6/18.  He is requesting to schedule a right Carotid Stent with you.  She had a Right CEA by Dr. Madilyn FiremanHayes in the past, and now has > 80 % right carotid stenosis.  With the new procedure, TCAR, I wanted to find out about proceeding with scheduling the carotid stent in the [facility] lab with Dr. Allyson SabalBerry.  The pt. Has to wait until after July 1st due to her finances, and being able to afford Plavix.  I am looking at possible dates: 7/10 ([facility] gone), or 7/20; although I haven't reached out to Dr. Eugene GaviaBerry's Scheduler yet, re: dates he is available.

## 2017-03-23 NOTE — Telephone Encounter (Signed)
Pt. was notified of Dr. Darrick PennaFields recommendation for CTA head and neck to eval. if she is a candidate for a TCAR procedure, and an appt. with Dr. Darrick PennaFields.  Advised she will be contacted by a Scheduler with appt. information.  Verb. Understanding/ agreed.

## 2017-04-06 ENCOUNTER — Ambulatory Visit: Payer: Self-pay | Admitting: Physician Assistant

## 2017-04-07 ENCOUNTER — Ambulatory Visit (HOSPITAL_COMMUNITY)
Admission: RE | Admit: 2017-04-07 | Discharge: 2017-04-07 | Disposition: A | Discharge status: Home / Self Care | Payer: Self-pay | Source: Ambulatory Visit | Attending: Vascular Surgery | Admitting: Vascular Surgery

## 2017-04-07 ENCOUNTER — Encounter: Payer: Self-pay | Admitting: Vascular Surgery

## 2017-04-07 ENCOUNTER — Other Ambulatory Visit: Payer: Self-pay | Admitting: Physician Assistant

## 2017-04-07 DIAGNOSIS — I6502 Occlusion and stenosis of left vertebral artery: Secondary | ICD-10-CM | POA: Insufficient documentation

## 2017-04-07 DIAGNOSIS — I6521 Occlusion and stenosis of right carotid artery: Secondary | ICD-10-CM

## 2017-04-07 DIAGNOSIS — Z01810 Encounter for preprocedural cardiovascular examination: Secondary | ICD-10-CM | POA: Insufficient documentation

## 2017-04-07 DIAGNOSIS — I6523 Occlusion and stenosis of bilateral carotid arteries: Secondary | ICD-10-CM | POA: Insufficient documentation

## 2017-04-07 MED ORDER — IOPAMIDOL (ISOVUE-370) INJECTION 76%
75.0000 mL | Freq: Once | INTRAVENOUS | Status: AC | PRN
  Administered 2017-04-07: 75 mL via INTRAVENOUS

## 2017-04-12 ENCOUNTER — Encounter: Payer: Self-pay | Admitting: Physician Assistant

## 2017-04-12 ENCOUNTER — Other Ambulatory Visit: Payer: Self-pay | Admitting: Physician Assistant

## 2017-04-12 DIAGNOSIS — I25110 Atherosclerotic heart disease of native coronary artery with unstable angina pectoris: Secondary | ICD-10-CM

## 2017-04-12 DIAGNOSIS — I10 Essential (primary) hypertension: Secondary | ICD-10-CM

## 2017-04-12 DIAGNOSIS — I779 Disorder of arteries and arterioles, unspecified: Secondary | ICD-10-CM

## 2017-04-12 DIAGNOSIS — E785 Hyperlipidemia, unspecified: Secondary | ICD-10-CM

## 2017-04-12 DIAGNOSIS — Z7901 Long term (current) use of anticoagulants: Secondary | ICD-10-CM

## 2017-04-12 DIAGNOSIS — I739 Peripheral vascular disease, unspecified: Secondary | ICD-10-CM

## 2017-04-12 NOTE — Progress Notes (Signed)
Pt called the office c/o quarter sized bruising over arms and legs and a fist sized bruise on her side from bumping on door. Pt states she read the warning label on effient which instructs pt to call her provider if she is experiencing bruising. Pt states she bruises by barely being touched.  Pt was offered an appointment for tomorrow 04-13-17 due to no available appointment for today. Pt was scheduled for 03-28-16-18 at 1:15, but pt called back and rescheduled for thurs. 04-15-17 at 1:30pm.   Pt was reminded to get lab work done since she has not done so (lab order was entered Feb 2018)

## 2017-04-13 ENCOUNTER — Ambulatory Visit: Payer: Self-pay | Admitting: Physician Assistant

## 2017-04-15 ENCOUNTER — Encounter: Payer: Self-pay | Admitting: Physician Assistant

## 2017-04-15 ENCOUNTER — Ambulatory Visit: Payer: Self-pay | Admitting: Physician Assistant

## 2017-04-15 ENCOUNTER — Other Ambulatory Visit: Payer: Self-pay

## 2017-04-15 ENCOUNTER — Other Ambulatory Visit (HOSPITAL_COMMUNITY)
Admission: RE | Admit: 2017-04-15 | Discharge: 2017-04-15 | Disposition: A | Discharge status: Home / Self Care | Payer: Self-pay | Source: Ambulatory Visit | Attending: Physician Assistant | Admitting: Physician Assistant

## 2017-04-15 ENCOUNTER — Ambulatory Visit (INDEPENDENT_AMBULATORY_CARE_PROVIDER_SITE_OTHER): Payer: Self-pay | Admitting: Vascular Surgery

## 2017-04-15 ENCOUNTER — Encounter: Payer: Self-pay | Admitting: Vascular Surgery

## 2017-04-15 VITALS — BP 154/92 | HR 48 | Temp 97.3°F | Resp 16 | Ht 64.0 in | Wt 165.8 lb

## 2017-04-15 VITALS — BP 144/100 | HR 51 | Temp 97.7°F

## 2017-04-15 DIAGNOSIS — Z7901 Long term (current) use of anticoagulants: Secondary | ICD-10-CM

## 2017-04-15 DIAGNOSIS — F17218 Nicotine dependence, cigarettes, with other nicotine-induced disorders: Secondary | ICD-10-CM

## 2017-04-15 DIAGNOSIS — I779 Disorder of arteries and arterioles, unspecified: Secondary | ICD-10-CM | POA: Insufficient documentation

## 2017-04-15 DIAGNOSIS — I739 Peripheral vascular disease, unspecified: Secondary | ICD-10-CM

## 2017-04-15 DIAGNOSIS — I6521 Occlusion and stenosis of right carotid artery: Secondary | ICD-10-CM

## 2017-04-15 DIAGNOSIS — I10 Essential (primary) hypertension: Secondary | ICD-10-CM | POA: Insufficient documentation

## 2017-04-15 DIAGNOSIS — T148XXA Other injury of unspecified body region, initial encounter: Secondary | ICD-10-CM

## 2017-04-15 DIAGNOSIS — I25110 Atherosclerotic heart disease of native coronary artery with unstable angina pectoris: Secondary | ICD-10-CM | POA: Insufficient documentation

## 2017-04-15 DIAGNOSIS — I2510 Atherosclerotic heart disease of native coronary artery without angina pectoris: Secondary | ICD-10-CM

## 2017-04-15 DIAGNOSIS — I714 Abdominal aortic aneurysm, without rupture, unspecified: Secondary | ICD-10-CM

## 2017-04-15 DIAGNOSIS — E785 Hyperlipidemia, unspecified: Secondary | ICD-10-CM | POA: Insufficient documentation

## 2017-04-15 LAB — POCT URINALYSIS DIPSTICK
BILIRUBIN UA: NEGATIVE
GLUCOSE UA: NEGATIVE
Ketones, UA: NEGATIVE
Nitrite, UA: NEGATIVE
Protein, UA: 30
RBC UA: NEGATIVE
Spec Grav, UA: 1.02 (ref 1.010–1.025)
Urobilinogen, UA: 0.2 E.U./dL
pH, UA: 5 (ref 5.0–8.0)

## 2017-04-15 LAB — COMPREHENSIVE METABOLIC PANEL
ALBUMIN: 4.1 g/dL (ref 3.5–5.0)
ALK PHOS: 91 U/L (ref 38–126)
ALT: 27 U/L (ref 14–54)
AST: 26 U/L (ref 15–41)
Anion gap: 9 (ref 5–15)
BUN: 21 mg/dL — ABNORMAL HIGH (ref 6–20)
CALCIUM: 9.4 mg/dL (ref 8.9–10.3)
CO2: 26 mmol/L (ref 22–32)
CREATININE: 0.95 mg/dL (ref 0.44–1.00)
Chloride: 104 mmol/L (ref 101–111)
GFR calc Af Amer: >60 mL/min (ref >60)
GFR calc non Af Amer: >60 mL/min (ref >60)
GLUCOSE: 105 mg/dL — AB (ref 65–99)
Potassium: 4.2 mmol/L (ref 3.5–5.1)
SODIUM: 139 mmol/L (ref 135–145)
Total Bilirubin: 1.3 mg/dL — ABNORMAL HIGH (ref 0.3–1.2)
Total Protein: 7.4 g/dL (ref 6.5–8.1)

## 2017-04-15 LAB — LIPID PANEL
Cholesterol: 168 mg/dL (ref 0–200)
HDL: 42 mg/dL (ref >40)
LDL Cholesterol: 100 mg/dL — ABNORMAL HIGH (ref 0–99)
Total CHOL/HDL Ratio: 4 RATIO
Triglycerides: 131 mg/dL (ref <150)
VLDL: 26 mg/dL (ref 0–40)

## 2017-04-15 LAB — PROTIME-INR
INR: 0.92
Prothrombin Time: 12.3 seconds (ref 11.4–15.2)

## 2017-04-15 LAB — CBC
HCT: 43.7 % (ref 36.0–46.0)
Hemoglobin: 14.1 g/dL (ref 12.0–15.0)
MCH: 30.3 pg (ref 26.0–34.0)
MCHC: 32.3 g/dL (ref 30.0–36.0)
MCV: 93.8 fL (ref 78.0–100.0)
Platelets: 319 10*3/uL (ref 150–400)
RBC: 4.66 MIL/uL (ref 3.87–5.11)
RDW: 13.2 % (ref 11.5–15.5)
WBC: 9.3 10*3/uL (ref 4.0–10.5)

## 2017-04-15 NOTE — Progress Notes (Signed)
BP (!) 144/100   Pulse (!) 51   Temp 97.7 F (36.5 C)   SpO2 98%    Subjective:    Patient ID: [name], female    DOB: 08/08/1961, 56 y.o.   MRN: [id number]  HPI: [name] LoftyDebra G Luebke is a 56 y.o. female presenting on 04/15/2017 for Bleeding/Bruising   HPI   Pt says she just came from the surgeon and he told her to stop the effient today.   She says they are not going to give her a stent but are going to "cut her".  She says her surgery is scheduled for august 1.  Pt had been taking the effient until today.   Pt has been bruising started 6 days ago.   Pt says she bumped into a doorknob that caused the bruise on her flank and got bumped that caused the bruises on her arms.  Pt denies blood in stool, urine.  Relevant past medical, surgical, family and social history reviewed and updated as indicated. Interim medical history since our last visit reviewed. Allergies and medications reviewed and updated.    Current Outpatient Prescriptions:  .  amLODipine (NORVASC) 10 MG tablet, Take 1 tablet (10 mg total) by mouth daily., Disp: 90 tablet, Rfl: 1 .  aspirin EC 81 MG tablet, Take 81 mg by mouth daily., Disp: , Rfl:  .  atorvastatin (LIPITOR) 80 MG tablet, TAKE 1 Tablet BY MOUTH ONCE DAILY AT 6 PM, Disp: 90 tablet, Rfl: 1 .  lisinopril (PRINIVIL,ZESTRIL) 20 MG tablet, Take 2 tablets (40 mg total) by mouth daily., Disp: 60 tablet, Rfl: 1 .  metoprolol tartrate (LOPRESSOR) 100 MG tablet, Take 2 tablets (200 mg total) by mouth 2 (two) times daily., Disp: 120 tablet, Rfl: 1 .  NITROSTAT 0.4 MG SL tablet, PLACE 1 TABLET UNDER TONGUE AS NEEDED FOR CHEST PAIN EVERY 5 MINUTES X 3 MAX DOSES.  CALL 911 IF PAIN PERSISTS, Disp: 25 tablet, Rfl: 1 .  prasugrel (EFFIENT) 10 MG TABS tablet, Take 1 tablet (10 mg total) by mouth daily. (Patient not taking: Reported on 04/15/2017), Disp: 90 tablet, Rfl: 1  Review of Systems  Constitutional: Negative for appetite change, chills, diaphoresis, fatigue,  fever and unexpected weight change.  HENT: Negative for congestion, dental problem, drooling, ear pain, facial swelling, hearing loss, mouth sores, sneezing, sore throat, trouble swallowing and voice change.   Eyes: Negative for pain, discharge, redness, itching and visual disturbance.  Respiratory: Negative for cough, choking, shortness of breath and wheezing.   Cardiovascular: Negative for chest pain, palpitations and leg swelling.  Gastrointestinal: Negative for abdominal pain, blood in stool, constipation, diarrhea and vomiting.  Endocrine: Negative for cold intolerance, heat intolerance and polydipsia.  Genitourinary: Negative for decreased urine volume, dysuria and hematuria.  Musculoskeletal: Negative for arthralgias, back pain and gait problem.  Skin: Negative for rash.  Allergic/Immunologic: Negative for environmental allergies.  Neurological: Negative for seizures, syncope, light-headedness and headaches.  Hematological: Negative for adenopathy.  Psychiatric/Behavioral: Negative for agitation, dysphoric mood and suicidal ideas. The patient is not nervous/anxious.     Per HPI unless specifically indicated above     Objective:    BP (!) 144/100   Pulse (!) 51   Temp 97.7 F (36.5 C)   SpO2 98%   Wt Readings from Last 3 Encounters:  04/15/17 165 lb 12.8 oz (75.2 kg)  03/15/17 167 lb 9.6 oz (76 kg)  03/12/17 168 lb 12.8 oz (76.6 kg)    Physical Exam  Constitutional: She is oriented to person, place, and time. She appears well-developed and well-nourished.  HENT:  Head: Normocephalic and atraumatic.  Neck: Neck supple.  Cardiovascular: Normal rate and regular rhythm.   Pulmonary/Chest: Effort normal and breath sounds normal.  Abdominal: Soft. Bowel sounds are normal. She exhibits no mass. There is no hepatosplenomegaly. There is no tenderness.  Musculoskeletal: She exhibits no edema.  Lymphadenopathy:    She has no cervical adenopathy.  Neurological: She is alert and  oriented to person, place, and time.  Skin: Skin is warm and dry.  Psychiatric: She has a normal mood and affect. Her behavior is normal.  Vitals reviewed.   Results for orders placed or performed during the hospital encounter of 04/15/17  INR/PT  Result Value Ref Range   Prothrombin Time 12.3 11.4 - 15.2 seconds   INR 0.92   CBC  Result Value Ref Range   WBC 9.3 4.0 - 10.5 K/uL   RBC 4.66 3.87 - 5.11 MIL/uL   Hemoglobin 14.1 12.0 - 15.0 g/dL   HCT 21.3 08.6 - 57.8 %   MCV 93.8 78.0 - 100.0 fL   MCH 30.3 26.0 - 34.0 pg   MCHC 32.3 30.0 - 36.0 g/dL   RDW 46.9 62.9 - 52.8 %   Platelets 319 150 - 400 K/uL      Assessment & Plan:    Encounter Diagnoses  Name Primary?  . Bruising Yes  . Anticoagulated   . Essential hypertension   . Cigarette nicotine dependence with other nicotine-induced disorder   . Carotid disease, bilateral (HCC)   . Coronary artery disease involving native coronary artery of native heart without angina pectoris   . AAA (abdominal aortic aneurysm) without rupture (HCC)     -pt urine without blood.  Pt to stop the effient per surgeon -pt to continue current bp meds.  Discussed that her bp is likely improved today since she hasn't used cocaine lately. Pt is strongly encouraged her to stay away from the cocaine.   -pt to follow up in 6 weeks.  RTO sooner prn

## 2017-04-15 NOTE — Progress Notes (Signed)
Patient is a 56 year old female referred for evaluation for possible carotid stenting by my partner Dr. Edilia Bo. The patient previously had a right carotid endarterectomy by Dr. Madilyn Fireman about 12 years ago. She was seen by Dr. Edilia Bo in May of this year and found to have a greater than 80% right internal carotid artery stenosis. In May she also had an episode of weakness in her left arm which lasted about 30 minutes. She admits to chronic intermittent cocaine use. She last used about 2 weeks ago. She was evaluated by my partner Dr. Myra Gianotti June 18. He sent her for CT angiogram evaluation for possible carotid stenting. Since he was out of town this week she was placed on my schedule to review the CT scan for possible stenting. The patient currently is on Effient in anticipation for carotid stenting. She has been on chronic aspirin. She has a long-standing history of coronary artery disease. She was placed on Effient because she could not afford Plavix and her insurance cover the Effient. Orts extensive bruising since going on Effient.  Her cardiologist is Dr. Diona Browner. She had a myocardial infarction about 6 months ago. She denies current chest pain.  Review of systems: She has shortness of breath with exertion. She denies any symptoms of TIA amaurosis or stroke since May.  Physical exam:  Vitals:   04/15/17 1032 04/15/17 1034  BP: (!) 183/95 (!) 154/92  Pulse: (!) 48   Resp: 16   Temp: (!) 97.3 F (36.3 C)   TempSrc: Oral   SpO2: 99%   Weight: 165 lb 12.8 oz (75.2 kg)   Height: 5\' 4"  (1.626 m)     Neck: Well-healed right neck scar no carotid bruits. Chest: Clear to auscultation bilaterally. Cardiac: Regular rate without murmur  Abdomen: Soft nontender  Extremities: 2+ femoral pulses bilaterally  Neuro: Symmetric upper and lower extremity motor strength 5 over 5 no facial asymmetry  Data: I reviewed the patient's CT angiogram which shows evidence of dissection adjacent to the area of patch.  There is normal carotid artery distal to this. The common carotid artery is relatively normal as well. Is also some thrombus adjacent to this area of dissection.  Assessment: Recurrent stenosis right internal carotid artery possible area of dissection or related to previous patch angioplasty difficult to know. The patient does have a history of cocaine use and certainly she would be at risk of carotid dissection. Due to the complex nature and the anatomy of her carotid bifurcation no I do not believe she is a candidate for carotid stenting. I believe the best option for her would be redo right carotid endarterectomy. The area of dissection is fairly focal and I do not believe that this would be difficult to get above with a redo operation. However, a guidewire couldn't easily become entangled in this. I also discussed with the patient today her cocaine use. I discussed with her that she is at very high risk of stroke and myocardial infarction if she continues to cocaine use. She is going to continue to try to quit. I did discuss with her that we would do a drug test preoperatively and if she tests positive for cocaine we may cancel her operation.  Plan: We will schedule the patient for redo right carotid endarterectomy by Dr. Edilia Bo within the next few weeks. She will need preoperative drug testing. I will leave it at the discretion of anesthesia whether or not they wish preoperative cardiac testing. If so she will need to be  reevaluated by her cardiologist Dr. Diona BrownerMcDowell.  Fabienne Brunsharles Aino Heckert, MD [facility] [place] Office: [phone] Pager: [phone]

## 2017-04-16 ENCOUNTER — Other Ambulatory Visit: Payer: Self-pay

## 2017-04-26 ENCOUNTER — Other Ambulatory Visit: Payer: Self-pay | Admitting: Physician Assistant

## 2017-04-26 NOTE — Pre-Procedure Instructions (Signed)
[name]  04/26/2017      [facility] [place] [address] [place] [address] Phone: [phone] Fax: [phone]  [facility] [address] - [place], [place] - [address] [place] [address] Phone: [phone] Fax: [phone]  [facility] [place] - [address] [place] [address] Phone: [phone] Fax: [phone]    Your procedure is scheduled on August 2  [facility] Ad[facility] at 0830 A.M.  Call this number if you have problems the morning of surgery:  [phone]   Remember:  Do not eat food or drink liquids after midnight.  Continue all other medications as directed by your physician except follow these instructions about you medications   Take these medicines the morning of surgery with A SIP OF WATER acetaminophen (TYLENOL),  amLODipine (NORVASC), metoprolol tartrate (LOPRESSOR), nitro if needed  7 days prior to surgery STOP taking any Aleve, Naproxen, Ibuprofen, Motrin, Advil, Goody's, BC's, all herbal medications, fish oil, and all vitamins  Follow your doctors instructions regarding your Aspirin.  If no instructions were given by the doctor you will need to call the office to get instructions.  Your pre admission RN will also call for those instructions     Do not wear jewelry, make-up or nail polish.  Do not wear lotions, powders, or perfumes, or deoderant.  Do not shave 48 hours prior to surgery.  Men may shave face and neck.  Do not bring valuables to the hospital.  [facility] is not responsible for any belongings or valuables.  Contacts, dentures or bridgework may not be worn into surgery.  Leave your suitcase in the car.  After surgery it may be brought to your room.  For patients admitted to the hospital, discharge time will be determined by your treatment team.  Patients discharged the day of surgery will not be  allowed to drive home.    Special instructions:   [place]- Preparing For Surgery  Before surgery, you can play an important role. Because skin is not sterile, your skin needs to be as free of germs as possible. You can reduce the number of germs on your skin by washing with CHG (chlorahexidine gluconate) Soap before surgery.  CHG is an antiseptic cleaner which kills germs and bonds with the skin to continue killing germs even after washing.  Please do not use if you have an allergy to CHG or antibacterial soaps. If your skin becomes reddened/irritated stop using the CHG.  Do not shave (including legs and underarms) for at least 48 hours prior to first CHG shower. It is OK to shave your face.  Please follow these instructions carefully.   1. Shower the NIGHT BEFORE SURGERY and the MORNING OF SURGERY with CHG.   2. If you chose to wash your hair, wash your hair first as usual with your normal shampoo.  3. After you shampoo, rinse your hair and body thoroughly to remove the shampoo.  4. Use CHG as you would any other liquid soap. You can apply CHG directly to the skin and wash gently with a scrungie or a clean washcloth.   5. Apply the CHG Soap to your body ONLY FROM THE NECK DOWN.  Do not use on open wounds or open sores. Avoid contact with your eyes, ears, mouth and genitals (private parts). Wash genitals (private  parts) with your normal soap.  6. Wash thoroughly, paying special attention to the area where your surgery will be performed.  7. Thoroughly rinse your body with warm water from the neck down.  8. DO NOT shower/wash with your normal soap after using and rinsing off the CHG Soap.  9. Pat yourself dry with a CLEAN TOWEL.   10. Wear CLEAN PAJAMAS   11. Place CLEAN SHEETS on your bed the night of your first shower and DO NOT SLEEP WITH PETS.    Day of Surgery: Do not apply any deodorants/lotions. Please wear clean clothes to the hospital/surgery center.      Please  read over the following fact sheets that you were given.

## 2017-04-27 ENCOUNTER — Encounter (HOSPITAL_COMMUNITY): Payer: Self-pay | Admitting: *Deleted

## 2017-04-27 ENCOUNTER — Encounter (HOSPITAL_COMMUNITY)
Admission: RE | Admit: 2017-04-27 | Discharge: 2017-04-27 | Disposition: A | Discharge status: Home / Self Care | Payer: Self-pay | Source: Ambulatory Visit | Attending: Vascular Surgery | Admitting: Vascular Surgery

## 2017-04-27 DIAGNOSIS — Z01812 Encounter for preprocedural laboratory examination: Secondary | ICD-10-CM | POA: Insufficient documentation

## 2017-04-27 DIAGNOSIS — I6521 Occlusion and stenosis of right carotid artery: Secondary | ICD-10-CM | POA: Insufficient documentation

## 2017-04-27 HISTORY — DX: Headache, unspecified: R51.9

## 2017-04-27 HISTORY — DX: Occlusion and stenosis of unspecified carotid artery: I65.29

## 2017-04-27 HISTORY — DX: Acute myocardial infarction, unspecified: I21.9

## 2017-04-27 HISTORY — DX: Headache: R51

## 2017-04-27 LAB — TYPE AND SCREEN
ABO/RH(D): O POS
Antibody Screen: NEGATIVE

## 2017-04-27 LAB — COMPREHENSIVE METABOLIC PANEL
ALK PHOS: 88 U/L (ref 38–126)
ALT: 20 U/L (ref 14–54)
ANION GAP: 8 (ref 5–15)
AST: 24 U/L (ref 15–41)
Albumin: 3.7 g/dL (ref 3.5–5.0)
BILIRUBIN TOTAL: 0.7 mg/dL (ref 0.3–1.2)
BUN: 13 mg/dL (ref 6–20)
CALCIUM: 9.2 mg/dL (ref 8.9–10.3)
CO2: 26 mmol/L (ref 22–32)
Chloride: 107 mmol/L (ref 101–111)
Creatinine, Ser: 0.86 mg/dL (ref 0.44–1.00)
Glucose, Bld: 108 mg/dL — ABNORMAL HIGH (ref 65–99)
Potassium: 3.8 mmol/L (ref 3.5–5.1)
Sodium: 141 mmol/L (ref 135–145)
TOTAL PROTEIN: 6.7 g/dL (ref 6.5–8.1)

## 2017-04-27 LAB — URINALYSIS, ROUTINE W REFLEX MICROSCOPIC
Bilirubin Urine: NEGATIVE
GLUCOSE, UA: NEGATIVE mg/dL
Hgb urine dipstick: NEGATIVE
KETONES UR: NEGATIVE mg/dL
Nitrite: NEGATIVE
PROTEIN: 30 mg/dL — AB
Specific Gravity, Urine: 1.021 (ref 1.005–1.030)
pH: 5 (ref 5.0–8.0)

## 2017-04-27 LAB — PROTIME-INR
INR: 1
PROTHROMBIN TIME: 13.2 s (ref 11.4–15.2)

## 2017-04-27 LAB — RAPID URINE DRUG SCREEN, HOSP PERFORMED
Amphetamines: NOT DETECTED
Barbiturates: NOT DETECTED
Benzodiazepines: NOT DETECTED
Cocaine: POSITIVE — AB
OPIATES: NOT DETECTED
TETRAHYDROCANNABINOL: POSITIVE — AB

## 2017-04-27 LAB — SURGICAL PCR SCREEN
MRSA, PCR: NEGATIVE
Staphylococcus aureus: NEGATIVE

## 2017-04-27 LAB — APTT: aPTT: 34 seconds (ref 24–36)

## 2017-04-27 NOTE — Progress Notes (Signed)
Notified Okey RegalCarol Pullins of urine drug screen showing positive for cocaine and other.  Also aware of UA slightly abnormal.  Okey RegalCarol stated she will notify Dr. Edilia Boickson.

## 2017-04-27 NOTE — Progress Notes (Addendum)
LEFT MESSAGE FOR CAROL AND KAYE AT DR. DICKSON'S OFFICE RE: ASPIRIN.  JUST WANTING TO VERIFY IF PATIENT SHOULD CONTINUE TAKING.  PATIENT STATED SHE WAS NOT INSTRUCTED TO STOP ASPIRIN SO SHE WILL CONTINUE TIL DOS UNLESS TOLD OTHERWISE.    (Jan RN relayed telephone message from Okey RegalCarol stating that patient should continue aspirin .)

## 2017-04-28 ENCOUNTER — Encounter (HOSPITAL_COMMUNITY): Payer: Self-pay | Admitting: Vascular Surgery

## 2017-04-28 ENCOUNTER — Encounter (HOSPITAL_COMMUNITY): Payer: Self-pay

## 2017-04-28 NOTE — Progress Notes (Signed)
Anesthesia chart review: Patient is a 56 year old female scheduled for redo right carotid endarterectomy on 04/29/17 by Dr. Waverly Ferrarihristopher Dickson. She was not felt to be a candidate for carotid stenting due to carotid anatomy (focal dissection adjacent to patch). She had transient left arm weakness in 01/2017, so Dr. Edilia Boickson thought carotid disease likely symptomatic.  History includes Bipolar disorder, anxiety, depression, CVA, AAA (4.1 cm 07/2016 CT), headaches, polysubstance abuse (tobacco, cocaine, THC), carotid artery disease s/p right carotid endarterectomy 07/22/05 with recurrent stenosis 01/2017, occluded left ICA), CAD (occluded RCA known since 07/16/05, 70% LAD, 60% DIAG1, 40-50% LCX 07/2016, medical therapy). She was [facility] for chest pain in the setting of recent cocaine use, lateral T wave inversion, SBP 240's with negative troponin X 3. She underwent cardiac cath showing multiple vessel disease. Due to her polysubstance abuse, non-compliance a trail of aggressive medical therapy was recommended prior to attempting LAD PCI. Antihypertensive, statin, ASA, clopidogrel added (stopped prior to discharge however due to right groin hematoma and absence of ACS/stenting).   - PCP is Jacquelin HawkingShannon McElroy, PA-C with [facility]. - Cardiologist is Dr. Nona DellSamuel McDowell, last visit 03/12/17. He saw her for follow-up and preoperative risk assessment. She had taken Nitro once since he last saw her. She had also used cocaine "only once." He wrote,  "Preoperative evaluation prior to anticipated right carotid stent due to severe symptomatic ICA disease. It would be hard to characterize Ms. Havrilla's periprocedural risk as anything other than high in light of her comorbidities, however choice of carotid stenting as opposed to an open vascular surgery would be preferable. Unfortunately, she has not been able to take Plavix, states that she cannot afford it, and it is apparently not  available through [facility]. I asked nursing to check with that service to see if there were other antiplatelet drugs available.Marland Kitchen.Marland Kitchen.Multivessel CAD by cardiac catheterization in November 2017, managed medically in light of complex comorbidities including substance abuse and noncompliance. Fortunately, she is not reporting progressive angina symptoms. I reinforced compliance with her current medications, she states that she has them all available at this time other than Plavix as mentioned above.Marland Kitchen.Marland Kitchen.Essential hypertension. She is now on Norvasc, lisinopril, and Lopressor. Blood pressure trend is better although not optimal. Lisinopril dose could be further up titrated next. I reinforced compliance...substance abuse including crack cocaine and marijuana. I reinforced the critical importance to her health of complete cessation...smoking cessation discussed."  Meds include amlodipine, ASA 325 mg, Lipitor, lisinopril, Lopressor. She had been on Effient (Plavix started by Dr. Edilia Boickson 02/10/17, but was not covered by [facility]) since carotid stenting was initially being considered. Notes indicate this was stopped on 04/15/17 when it was determined that she was not a carotid stent candidate. She was having bruising while on Effient.   BP (!) 172/87   Pulse (!) 53   Temp 36.8 C   Resp 18   Ht 5\' 3"  (1.6 m)   Wt 166 lb 12.8 oz (75.7 kg)   SpO2 100%   BMI 29.55 kg/m   EKG 08/25/16: SR, prominent P waves, nondiagnostic. Anterior infarct (age undetermined). Possible inferior infarct (age undetermined).   Echo 08/08/16: Study Conclusions - Left ventricle: The cavity size was normal. There was mild   concentric hypertrophy. Systolic function was normal. The   estimated ejection fraction was in the range of 55% to 60%. Wall   motion was normal; there were no regional wall motion   abnormalities. Doppler parameters are consistent with abnormal  left ventricular relaxation (grade 1 diastolic dysfunction). - Left  atrium: The atrium was mildly dilated. - Atrial septum: No defect or patent foramen ovale was identified.  Cardiac cath 08/07/16:  There is mild left ventricular systolic dysfunction.  The left ventricular ejection fraction is 45-50% by visual estimate.  There is no mitral valve regurgitation.  LV end diastolic pressure is mildly elevated.  Mid RCA lesion, 100 %stenosed.  Dist RCA lesion, 75 %stenosed.  Mid LAD-2 lesion, 60 %stenosed.  Mid LAD-1 lesion, 70 %stenosed.  Dist LAD-2 lesion, 45 %stenosed.  Dist LAD-1 lesion, 50 %stenosed.  2nd Mrg lesion, 40 %stenosed. CONCLUSION: 1. Moderate LV dysfunction with upper mid to basal inferior hypocontractility.  Global ejection fraction is 45-50%. 2. Multivessel  CAD with calcification of the proximal LAD with 70% followed by 60% stenoses between the first and second diagonal vessel with segmental 50% stenoses in the mid distal LAD; 40-50% diffuse narrowing in the distal circumflex marginal branch; an old total occlusion of the proximal RCA with bridging collaterals. RECOMMENDATION: With the patient's long-standing history of tobacco use, polysubstance abuse, without taking medications and potential for noncompliance, recommend an initial attempt at aggressive medical management prior to any attempt at intervention to the LAD system.  With her hypertensive history will initiate amlodipine, both for blood pressure and potential ischemic and vasospastic benefit, continue carvedilol, high potency statin therapy  and possibly add nitrates and ACE/ARB if needed.  Will initiate aspirin and generic clopidogrel.   CT head/CTA head/neck 04/07/17: IMPRESSION: CT head: 1. No acute intracranial abnormality. 2. Progression of moderate chronic microvascular ischemic changes and mild parenchymal volume loss of the brain. Chronic infarcts in left frontal lobe, basal ganglia, and right corona radiata. CTA neck: 1. 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 2. Right carotid bifurcation mixed plaque with prominent fibrofatty component and mild less than 50% proximal ICA stenosis. 3. Right proximal internal carotid artery focal dissection just downstream to bifurcation with 60-70% stenosis. 4. Right vertebral origin moderate 60% stenosis. 5. Occlusion of left common and internal carotid arteries in the neck. 6. Occlusion of left vertebral artery origin to the C4 level where it is reconstituted via collaterals. CTA head: 1. Occlusion of left internal carotid artery to the ophthalmic segment. Left internal carotid artery from the ophthalmic segment terminus is diminutive in caliber and irregular. 2. Otherwise no large vessel occlusion, aneurysm, high-grade stenosis, or vascular malformation of circle of Willis identified.  Carotid U/S 02/10/17: Impression: Velocities in the right internal carotid artery are consistent with 80-99% diameter reduction. 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  CXR 08/25/16: IMPRESSION: No acute pulmonary process.  Aortic atherosclerosis.  Preoperative labs noted. Labs on 04/27/17 showed Cr 0.86, glucose 108. PT/PTT WNL. UA trace leukocytes, negative nitrites. UDS positive for cocaine and THC. CBC on 04/15/17 was WNL. Positive drug screen already called to [facility] RN Okey Regalarol who will review with Dr. Edilia Boickson.   Reviewed above with anesthesiologist Dr. Hart RochesterHollis. If procedure remains as scheduled then would recommend repeating UDS for the day of surgery. Anesthesiologist and surgeon can determine at that time whether to proceed or note following results and patient exam.   Velna Ochsllison [place], PA-C [facility] Phone [phone] 04/28/2017 10:52 AM

## 2017-04-29 ENCOUNTER — Encounter (HOSPITAL_COMMUNITY): Admission: RE | Payer: Self-pay | Source: Ambulatory Visit

## 2017-04-29 ENCOUNTER — Telehealth: Payer: Self-pay | Admitting: Vascular Surgery

## 2017-04-29 ENCOUNTER — Inpatient Hospital Stay (HOSPITAL_COMMUNITY): Admission: RE | Admit: 2017-04-29 | Payer: Self-pay | Source: Ambulatory Visit | Admitting: Vascular Surgery

## 2017-04-29 SURGERY — ENDARTERECTOMY, CAROTID
Anesthesia: General | Site: Neck | Laterality: Right

## 2017-04-29 NOTE — Telephone Encounter (Signed)
-----   Message from Phillips Odorarol S Pullins, RN sent at 04/28/2017 12:52 PM EDT ----- Regarding: needs OV with Dr. Vickey Hugerickson Contact: [phone] Was sched. For Redo Right CEA 8/2; case cancelled for positive drug screen.  Dr. Edilia Boickson is requesting to see her in the office; he said it needs to be much sooner than later.  When you call to schedule her, let her know that Dr. Edilia Boickson requested to evaluate her prior to rescheduling surgery, as he has not seen her in awhile.

## 2017-04-29 NOTE — Telephone Encounter (Signed)
Sched appt 05/26/17 at 9:15. Spoke to pt's friend debbie.

## 2017-05-14 ENCOUNTER — Encounter: Payer: Self-pay | Admitting: Vascular Surgery

## 2017-05-20 ENCOUNTER — Ambulatory Visit: Payer: Self-pay | Admitting: Vascular Surgery

## 2017-05-26 ENCOUNTER — Ambulatory Visit (INDEPENDENT_AMBULATORY_CARE_PROVIDER_SITE_OTHER): Payer: Self-pay | Admitting: Vascular Surgery

## 2017-05-26 ENCOUNTER — Encounter: Payer: Self-pay | Admitting: Vascular Surgery

## 2017-05-26 ENCOUNTER — Ambulatory Visit: Payer: Self-pay | Admitting: Vascular Surgery

## 2017-05-26 VITALS — BP 181/98 | HR 58 | Temp 97.8°F | Resp 18 | Ht 64.0 in | Wt 168.5 lb

## 2017-05-26 DIAGNOSIS — I6521 Occlusion and stenosis of right carotid artery: Secondary | ICD-10-CM

## 2017-05-26 NOTE — Progress Notes (Signed)
Patient name: [name] MRN: [id number] DOB: 01-06-1961 Sex: female  REASON FOR VISIT:    Recurrent right carotid stenosis. Evaluate for redo right carotid endarterectomy.  HPI:   [name] is a pleasant 56 y.o. female who I saw on 02/10/2017 in consultation with a recurrent greater than 80% right carotid stenosis. The patient has a known left internal carotid artery occlusion. This patient had a right carotid endarterectomy by Dr. Liliane Bade proximally 12 years ago. When I saw her she had a transient episode of left arm weakness which lasted 30 minutes. The patient has significant coronary artery disease and given that this was a recurrent stenosis she was seen by Dr. Fabienne Bruns to be evaluated for possible carotid stenting.  When Dr. Darrick Penna that seen on July she had admitted to some cocaine use in the last 2 weeks. She was placed on Effient as she could not afford Plavix prior to anticipated carotid stenting. The patient had a complex plaque on CT angiogram with possible dissection and given the complex nature and the anatomy of her bifurcation he did not think she was a candidate for carotid stenting. He felt that the best option was redo right carotid endarterectomy. The area of dissection was fairly focal and appeared to be surgically accessible.  Since I saw her last, she denies any focal weakness or paresthesias in her upper extremity is her lower extremity. She denies any expressive or receptive aphasia. She's had no amaurosis fugax. She is no longer on Effient. She is on aspirin and a statin.  She has used cocaine in the last 2 weeks. She smokes marijuana frequently.  The patient does have a history of coronary artery disease. Cardiac cath that was done on 08/07/2016. This showed moderate LV dysfunction with an ejection fraction of 45-50%. There was multivessel coronary artery disease with calcification of the proximal LAD with a 70% followed by a 60% stenosis between the  first and second diagonal vessels with a segmental 50% stenosis in the mid to distal LAD. There was a 40-50% distal narrowing in the circumflex marginal branch and a total occlusion of the proximal right coronary artery with bridging collaterals. Aggressive medical therapy was recommended.   She is followed by Dr. Nona Dell.  The patient also has a 4.1 cm infrarenal abdominal aortic aneurysm.  Past Medical History:  Diagnosis Date  . AAA (abdominal aortic aneurysm) without rupture (HCC) 08/25/2016  . Anginal pain (HCC)    LAST NTG TAKEN  2 MO AGO   . Anxiety   . Bipolar 1 disorder (HCC)   . CAD (coronary artery disease)    a. 08/08/16 LAD 70%, 60% 1st diag, CTO RCA Med Rx EF 55%  . Carotid artery stenosis    right carotid endarterectomy 07/22/05 with recurrent stenosis 01/2017; known left ICA occlusion  . Crack cocaine use   . Depression   . Headache   . Hypertension   . Myocardial infarction (HCC)   . Right Groin Hematoma    a. 07/2016 following diagnostic cath  . Stroke (HCC)   . Tobacco abuse     Family History  Problem Relation Age of Onset  . Heart attack Mother   . Stroke Mother   . Hypertension Mother   . Diabetes Mother   . Heart disease Mother   . Heart attack Father   . Hypertension Father   . Heart disease Father   . Cancer Maternal Aunt   . Cancer Maternal Uncle   .  COPD Paternal Uncle        2 pat uncles with COPD  . Cancer Paternal Uncle        1 pat uncle with cancer    SOCIAL HISTORY: She smokes half a pack per day of cigarettes. Social History  Substance Use Topics  . Smoking status: Current Every Day Smoker    Packs/day: 0.50    Years: 42.00    Types: Cigarettes  . Smokeless tobacco: Never Used     Comment: 10 cigarettes per day  . Alcohol use No    Allergies  Allergen Reactions  . Penicillins Hives and Swelling    Has patient had a PCN reaction causing immediate rash, facial/tongue/throat swelling, SOB or lightheadedness with  hypotension: Yes Has patient had a PCN reaction causing severe rash involving mucus membranes or skin necrosis: Yes Has patient had a PCN reaction that required hospitalization No Has patient had a PCN reaction occurring within the last 10 years: Non If all of the above answers are "NO", then may proceed with Cephalosporin use.       REVIEW OF SYSTEMS:  [X]  denotes positive finding, [ ]  denotes negative finding Cardiac  Comments:  Chest pain or chest pressure:    Shortness of breath upon exertion: X   Short of breath when lying flat:    Irregular heart rhythm:        Vascular    Pain in calf, thigh, or hip brought on by ambulation:    Pain in feet at night that wakes you up from your sleep:     Blood clot in your veins:    Leg swelling:  X       Pulmonary    Oxygen at home:    Productive cough:  X   Wheezing:         Neurologic    Sudden weakness in arms or legs:     Sudden numbness in arms or legs:     Sudden onset of difficulty speaking or slurred speech:    Temporary loss of vision in one eye:     Problems with dizziness:         Gastrointestinal    Blood in stool:     Vomited blood:         Genitourinary    Burning when urinating:     Blood in urine:        Psychiatric    Major depression:  X       Hematologic    Bleeding problems:    Problems with blood clotting too easily:        Skin    Rashes or ulcers:        Constitutional    Fever or chills:     PHYSICAL EXAM:   Vitals:   05/26/17 0902 05/26/17 0903  BP: (!) 182/99 (!) 181/98  Pulse: (!) 58   Resp: 18   Temp: 97.8 F (36.6 C)   TempSrc: Oral   SpO2: 98%   Weight: 168 lb 8 oz (76.4 kg)   Height: 5\' 4"  (1.626 m)     GENERAL: The patient is a well-nourished female, in no acute distress. The vital signs are documented above. CARDIAC: There is a regular rate and rhythm.  VASCULAR: She has a right carotid bruit. She has palpable femoral pulses. She has palpable dorsalis pedis  pulses. She has no significant lower extremity swelling. PULMONARY: There is good air exchange bilaterally without wheezing or rales. ABDOMEN:  Soft and non-tender with normal pitched bowel sounds. I cannot palpate her abdominal aortic aneurysm. MUSCULOSKELETAL: There are no major deformities or cyanosis. NEUROLOGIC: No focal weakness or paresthesias are detected. SKIN: There are no ulcers or rashes noted. PSYCHIATRIC: The patient has a normal affect.  DATA:    CT ANGIOGRAM NECK: I reviewed her CT angiogram of the neck and agree with Dr. Darrick Penna interpretation. There appears to be a focal dissection. The stenosis does appear to be surgically accessible. It does not appear to be especially tight. However the carotid duplex scan velocities suggest that the stenosis is greater than 80%.  CAROTID DUPLEX: The carotid duplex scan done in May showed a peak systolic velocity on the right of 398 cm/s with an end-diastolic velocity of 129 cm/s. This would suggest a greater than 80% right carotid stenosis. The left internal carotid artery is occluded.  MEDICAL ISSUES:   RECURRENT RIGHT CAROTID STENOSIS: This patient has a greater than 80% recurrent right carotid stenosis with a known left internal carotid artery occlusion. She has had some left arm weakness several months ago but no symptoms recently. I have recommended redo right carotid endarterectomy in order to lower her risk of future stroke. We have discussed indications to procedure and potential complications including a 1-2% risk of perioperative stroke. Before scheduling her surgery however there are several issues. Her blood pressure is poorly controlled and she is following up with her primary care physician on this. She has an appointment on 06/08/2017. She understands that her surgery will likely be canceled if her blood pressure is not well controlled preoperatively.  In addition she understands that if she tests positive for cocaine when she  comes in for surgery her surgery will likely be canceled as it would not be safe to give anesthesia. Finally, she will need preoperative cardiac clearance. She does have an appointment in September with Dr. Diona Browner.   HYPERTENSION: The patient's initial blood pressure today was elevated. We repeated this and this was still elevated. We have encouraged the patient to follow up with their primary care physician for management of their blood pressure.  ABDOMINAL AORTIC ANEURYSM: The patient has a known 4.1 cm infrarenal abdominal aortic aneurysm. This is based on a CT scan that was done in November 2017. She will need a duplex of her abdominal aorta in November 2018.    Waverly Ferrari [facility] [phone]

## 2017-05-27 ENCOUNTER — Ambulatory Visit: Payer: Self-pay | Admitting: Physician Assistant

## 2017-06-08 ENCOUNTER — Encounter: Payer: Self-pay | Admitting: Physician Assistant

## 2017-06-08 ENCOUNTER — Ambulatory Visit: Payer: Self-pay | Admitting: Physician Assistant

## 2017-06-08 VITALS — BP 170/90 | HR 71 | Temp 97.5°F | Ht 64.0 in | Wt 167.8 lb

## 2017-06-08 DIAGNOSIS — I25110 Atherosclerotic heart disease of native coronary artery with unstable angina pectoris: Secondary | ICD-10-CM

## 2017-06-08 DIAGNOSIS — F1410 Cocaine abuse, uncomplicated: Secondary | ICD-10-CM

## 2017-06-08 DIAGNOSIS — F17218 Nicotine dependence, cigarettes, with other nicotine-induced disorders: Secondary | ICD-10-CM

## 2017-06-08 DIAGNOSIS — I10 Essential (primary) hypertension: Secondary | ICD-10-CM

## 2017-06-08 DIAGNOSIS — I739 Peripheral vascular disease, unspecified: Secondary | ICD-10-CM

## 2017-06-08 DIAGNOSIS — I714 Abdominal aortic aneurysm, without rupture, unspecified: Secondary | ICD-10-CM

## 2017-06-08 DIAGNOSIS — I779 Disorder of arteries and arterioles, unspecified: Secondary | ICD-10-CM

## 2017-06-08 DIAGNOSIS — E785 Hyperlipidemia, unspecified: Secondary | ICD-10-CM

## 2017-06-08 MED ORDER — CLONIDINE HCL 0.1 MG PO TABS: 0.1000 mg | ORAL_TABLET | Freq: Two times a day (BID) | ORAL | 1 refills | Status: DC

## 2017-06-08 NOTE — Progress Notes (Signed)
BP (!) 170/90 (BP Location: Left Arm, Patient Position: Sitting, Cuff Size: Normal)   Pulse 71   Temp (!) 97.5 F (36.4 C)   Ht  (1.626 m)   Wt 167 lb 12 oz (76.1 kg)   LMP 07/18/2011   SpO2 99%   BMI 28.79 kg/m    Subjective:    Patient ID: [name], female    DOB: [birth year], 56 y.o.   MRN: [id number]  HPI: [name] is a 56 y.o. female presenting on 06/08/2017 for Hypertension   HPI   Pt insists that she takes her meds every day.   She says she is using cocaine maybe twice/week.  She is still smoking.  Pt has been seeing surgeon who is planning repeat carotid endarterectomy but it will not be done until pt stops using cocaine and gets her BP controlled.    Discussed with pt that her bp was 144/100 when last she was in our office (july) and it is up a lot today with same medications.    Relevant past medical, surgical, family and social history reviewed and updated as indicated. Interim medical history since our last visit reviewed. Allergies and medications reviewed and updated.   Current Outpatient Prescriptions:  .  acetaminophen (TYLENOL) 325 MG tablet, Take 650-975 mg by mouth 3 (three) times daily as needed for moderate pain or headache., Disp: , Rfl:  .  amLODipine (NORVASC) 10 MG tablet, Take 1 tablet (10 mg total) by mouth daily., Disp: 90 tablet, Rfl: 1 .  aspirin EC 325 MG tablet, Take 325 mg by mouth daily., Disp: , Rfl:  .  atorvastatin (LIPITOR) 80 MG tablet, TAKE 1 Tablet BY MOUTH ONCE DAILY AT 6 PM, Disp: 90 tablet, Rfl: 1 .  lisinopril (PRINIVIL,ZESTRIL) 20 MG tablet, Take 2 tablets (40 mg total) by mouth daily. (Patient taking differently: Take 20 mg by mouth 2 (two) times daily. ), Disp: 60 tablet, Rfl: 1 .  metoprolol tartrate (LOPRESSOR) 100 MG tablet, Take 2 tablets (200 mg total) by mouth 2 (two) times daily., Disp: 120 tablet, Rfl: 1 .  NITROSTAT 0.4 MG SL tablet, PLACE 1 TABLET UNDER TONGUE AS NEEDED FOR CHEST PAIN EVERY 5 MINUTES  X 3 MAX DOSES.  CALL 911 IF PAIN PERSISTS, Disp: 100 tablet, Rfl: 1   Review of Systems  Constitutional: Positive for diaphoresis. Negative for appetite change, chills, fatigue, fever and unexpected weight change.  HENT: Positive for congestion. Negative for dental problem, drooling, ear pain, facial swelling, hearing loss, mouth sores, sneezing, sore throat, trouble swallowing and voice change.   Eyes: Negative for pain, discharge, redness, itching and visual disturbance.  Respiratory: Negative for cough, choking, shortness of breath and wheezing.   Cardiovascular: Positive for leg swelling. Negative for chest pain and palpitations.  Gastrointestinal: Negative for abdominal pain, blood in stool, constipation, diarrhea and vomiting.  Endocrine: Negative for cold intolerance, heat intolerance and polydipsia.  Genitourinary: Negative for decreased urine volume, dysuria and hematuria.  Musculoskeletal: Positive for arthralgias. Negative for back pain and gait problem.  Skin: Negative for rash.  Allergic/Immunologic: Negative for environmental allergies.  Neurological: Positive for headaches. Negative for seizures, syncope and light-headedness.  Hematological: Negative for adenopathy.  Psychiatric/Behavioral: Positive for dysphoric mood. Negative for agitation and suicidal ideas. The patient is nervous/anxious.     Per HPI unless specifically indicated above     Objective:    BP (!) 170/90 (BP Location: Left Arm, Patient Position: Sitting, Cuff Size: Normal)  Pulse 71   Temp (!) 97.5 F (36.4 C)   Ht 5\' 4"  (1.626 m)   Wt 167 lb 12 oz (76.1 kg)   LMP 07/18/2011   SpO2 99%   BMI 28.79 kg/m   Wt Readings from Last 3 Encounters:  06/08/17 167 lb 12 oz (76.1 kg)  05/26/17 168 lb 8 oz (76.4 kg)  04/27/17 166 lb 12.8 oz (75.7 kg)    Physical Exam  Constitutional: She is oriented to person, place, and time. She appears well-developed and well-nourished.  HENT:  Head: Normocephalic and  atraumatic.  Neck: Neck supple.  Cardiovascular: Normal rate and regular rhythm.   Pulmonary/Chest: Effort normal and breath sounds normal.  Abdominal: Soft. Bowel sounds are normal. She exhibits no mass. There is no hepatosplenomegaly. There is no tenderness.  Musculoskeletal: She exhibits no edema.  Lymphadenopathy:    She has no cervical adenopathy.  Neurological: She is alert and oriented to person, place, and time.  Skin: Skin is warm and dry.  Psychiatric: She has a normal mood and affect. Her behavior is normal.  Vitals reviewed.   Results for orders placed or performed during the hospital encounter of 04/27/17  Surgical pcr screen  Result Value Ref Range   MRSA, PCR NEGATIVE NEGATIVE   Staphylococcus aureus NEGATIVE NEGATIVE  APTT  Result Value Ref Range   aPTT 34 24 - 36 seconds  Comprehensive metabolic panel  Result Value Ref Range   Sodium 141 135 - 145 mmol/L   Potassium 3.8 3.5 - 5.1 mmol/L   Chloride 107 101 - 111 mmol/L   CO2 26 22 - 32 mmol/L   Glucose, Bld 108 (H) 65 - 99 mg/dL   BUN 13 6 - 20 mg/dL   Creatinine, Ser 4.090.86 0.44 - 1.00 mg/dL   Calcium 9.2 8.9 - 81.110.3 mg/dL   Total Protein 6.7 6.5 - 8.1 g/dL   Albumin 3.7 3.5 - 5.0 g/dL   AST 24 15 - 41 U/L   ALT 20 14 - 54 U/L   Alkaline Phosphatase 88 38 - 126 U/L   Total Bilirubin 0.7 0.3 - 1.2 mg/dL   GFR calc non Af Amer >60 >60 mL/min   GFR calc Af Amer >60 >60 mL/min   Anion gap 8 5 - 15  Protime-INR  Result Value Ref Range   Prothrombin Time 13.2 11.4 - 15.2 seconds   INR 1.00   Urinalysis, Routine w reflex microscopic  Result Value Ref Range   Color, Urine YELLOW YELLOW   APPearance HAZY (A) CLEAR   Specific Gravity, Urine 1.021 1.005 - 1.030   pH 5.0 5.0 - 8.0   Glucose, UA NEGATIVE NEGATIVE mg/dL   Hgb urine dipstick NEGATIVE NEGATIVE   Bilirubin Urine NEGATIVE NEGATIVE   Ketones, ur NEGATIVE NEGATIVE mg/dL   Protein, ur 30 (A) NEGATIVE mg/dL   Nitrite NEGATIVE NEGATIVE   Leukocytes, UA  TRACE (A) NEGATIVE   RBC / HPF 0-5 0 - 5 RBC/hpf   WBC, UA 0-5 0 - 5 WBC/hpf   Bacteria, UA RARE (A) NONE SEEN   Squamous Epithelial / LPF 0-5 (A) NONE SEEN   Mucus PRESENT   Rapid urine drug screen (hospital performed)  Result Value Ref Range   Opiates NONE DETECTED NONE DETECTED   Cocaine POSITIVE (A) NONE DETECTED   Benzodiazepines NONE DETECTED NONE DETECTED   Amphetamines NONE DETECTED NONE DETECTED   Tetrahydrocannabinol POSITIVE (A) NONE DETECTED   Barbiturates NONE DETECTED NONE DETECTED  Type and  screen  Result Value Ref Range   ABO/RH(D) O POS    Antibody Screen NEG    Sample Expiration 05/11/2017    Extend sample reason NO TRANSFUSIONS OR PREGNANCY IN THE PAST 3 MONTHS       Assessment & Plan:   Encounter Diagnoses  Name Primary?  . Essential hypertension Yes  . Cocaine abuse   . Carotid disease, bilateral (HCC)   . AAA (abdominal aortic aneurysm) without rupture (HCC)   . Coronary artery disease involving native coronary artery of native heart with unstable angina pectoris (HCC)   . Dyslipidemia   . Cigarette nicotine dependence with other nicotine-induced disorder      -had long discussion with pt today about the need for her to get her bp controlled and get her surgery.  Prerequisite for those are cocaine cessation.  Gave pt [facility] card and urged her to call or go to walk-in to get help with stopping the cocaine.  She says she will call this week -pt to continue current medications.  Will add clonidine.  rx is sent [facility].  Pt says she has no money at all to get medication locally.  She is aware it will take 1-2 weeks for the rx to arrive.  -pt has appt later this month with cardiology -pt due for duplex of abdominal aorta in November -pt to follow up here in one month.  RTO sooner prn

## 2017-06-21 ENCOUNTER — Encounter: Payer: Self-pay | Admitting: Cardiology

## 2017-06-21 ENCOUNTER — Ambulatory Visit: Payer: Self-pay | Admitting: Cardiology

## 2017-06-21 NOTE — Progress Notes (Deleted)
Cardiology Office Note  Date: 06/21/2017   ID: [name], DOB [birth year], MRN [id number]  PCP: Jacquelin Hawking, PA-C  Primary Cardiologist: Nona Dell, MD   No chief complaint on file.   History of Present Illness: [name] is a medically complex and high risk 56 y.o. female last seen in June. I reviewed interval records. She was recently seen by Dr. Edilia Bo in late August for follow-up of greater than 80% recurrent or ICA stenosis with known LICA occlusion. After CT angiography she is not felt to be a good candidate for carotid artery stenting. She is being considered for carotid endarterectomy at this point.  Follow-up visit with PCP noted on September 11, at that time blood pressure was 170/90. Note from Ms. McElroy PA-C indicates that the patient stated she was using cocaine twice a week. She was referred to [facility]. Clonidine was also added to her antihypertensive regimen.  Cardiac catheterization from November 2017 is outlined below.  Past Medical History:  Diagnosis Date  . AAA (abdominal aortic aneurysm) without rupture (HCC) 08/25/2016  . Anxiety   . Bipolar 1 disorder (HCC)   . CAD (coronary artery disease)    a. 08/08/16 LAD 70%, 60% 1st diag, CTO RCA Med Rx EF 55%  . Carotid artery stenosis    Right carotid endarterectomy 07/22/05 with recurrent stenosis 01/2017; known left ICA occlusion  . Crack cocaine use   . Depression   . Headache   . Hypertension   . Myocardial infarction (HCC)   . Right Groin Hematoma    a. 07/2016 following diagnostic cath  . Stroke (HCC)   . Tobacco abuse     Past Surgical History:  Procedure Laterality Date  . CARDIAC CATHETERIZATION N/A 08/07/2016   Procedure: Left Heart Cath and Coronary Angiography;  Surgeon: Lennette Bihari, MD;  Location: [facility] INVASIVE CV LAB;  Service: Cardiovascular;  Laterality: N/A;  . CYST REMOVAL TRUNK Left    beneath L breast  . TUBAL LIGATION    . VASCULAR SURGERY Right    right  carotid endarterectomy 07/22/05     Current Outpatient Prescriptions  Medication Sig Dispense Refill  . acetaminophen (TYLENOL) 325 MG tablet Take 650-975 mg by mouth 3 (three) times daily as needed for moderate pain or headache.    Marland Kitchen amLODipine (NORVASC) 10 MG tablet Take 1 tablet (10 mg total) by mouth daily. 90 tablet 1  . aspirin EC 325 MG tablet Take 325 mg by mouth daily.    Marland Kitchen atorvastatin (LIPITOR) 80 MG tablet TAKE 1 Tablet BY MOUTH ONCE DAILY AT 6 PM 90 tablet 1  . cloNIDine (CATAPRES) 0.1 MG tablet Take 1 tablet (0.1 mg total) by mouth 2 (two) times daily. 180 tablet 1  . lisinopril (PRINIVIL,ZESTRIL) 20 MG tablet Take 2 tablets (40 mg total) by mouth daily. (Patient taking differently: Take 20 mg by mouth 2 (two) times daily. ) 60 tablet 1  . metoprolol tartrate (LOPRESSOR) 100 MG tablet Take 2 tablets (200 mg total) by mouth 2 (two) times daily. 120 tablet 1  . NITROSTAT 0.4 MG SL tablet PLACE 1 TABLET UNDER TONGUE AS NEEDED FOR CHEST PAIN EVERY 5 MINUTES X 3 MAX DOSES.  CALL 911 IF PAIN PERSISTS 100 tablet 1   No current facility-administered medications for this visit.    Allergies:  Penicillins   Social History: The patient  reports that she has been smoking Cigarettes.  She has a 21.00 pack-year smoking history. She has never  used smokeless tobacco. She reports that she uses drugs, including Marijuana and "Crack" cocaine. She reports that she does not drink alcohol.   Family History: The patient's family history includes COPD in her paternal uncle; Cancer in her maternal aunt, maternal uncle, and paternal uncle; Diabetes in her mother; Heart attack in her father and mother; Heart disease in her father and mother; Hypertension in her father and mother; Stroke in her mother.   ROS:  Please see the history of present illness. Otherwise, complete review of systems is positive for {NONE DEFAULTED:18576::"none"}.  All other systems are reviewed and negative.   Physical Exam: VS:   LMP 07/18/2011 , BMI There is no height or weight on file to calculate BMI.  Wt Readings from Last 3 Encounters:  06/08/17 167 lb 12 oz (76.1 kg)  05/26/17 168 lb 8 oz (76.4 kg)  04/27/17 166 lb 12.8 oz (75.7 kg)    General: Patient appears comfortable at rest. HEENT: Conjunctiva and lids normal, oropharynx clear with moist mucosa. Neck: Supple, no elevated JVP or carotid bruits, no thyromegaly. Lungs: Clear to auscultation, nonlabored breathing at rest. Cardiac: Regular rate and rhythm, no S3 or significant systolic murmur, no pericardial rub. Abdomen: Soft, nontender, no hepatomegaly, bowel sounds present, no guarding or rebound. Extremities: No pitting edema, distal pulses 2+. Skin: Warm and dry. Musculoskeletal: No kyphosis. Neuropsychiatric: Alert and oriented x3, affect grossly appropriate.  ECG: I personally reviewed the tracing from 08/25/2016 which showed sinus rhythm with poor R-wave progression, rule out old anterior infarct pattern.  Recent Labwork: 08/06/2016: TSH 0.490 04/15/2017: Hemoglobin 14.1; Platelets 319 04/27/2017: ALT 20; AST 24; BUN 13; Creatinine, Ser 0.86; Potassium 3.8; Sodium 141     Component Value Date/Time   CHOL 168 04/15/2017 1252   TRIG 131 04/15/2017 1252   HDL 42 04/15/2017 1252   CHOLHDL 4.0 04/15/2017 1252   VLDL 26 04/15/2017 1252   LDLCALC 100 (H) 04/15/2017 1252    Other Studies Reviewed Today:  Echocardiogram 08/08/2016: Study Conclusions  - Left ventricle: The cavity size was normal. There was mild concentric hypertrophy. Systolic function was normal. The estimated ejection fraction was in the range of 55% to 60%. Wall motion was normal; there were no regional wall motion abnormalities. Doppler parameters are consistent with abnormal left ventricular relaxation (grade 1 diastolic dysfunction). - Left atrium: The atrium was mildly dilated. - Atrial septum: No defect or patent foramen ovale was identified.  Cardiac  catheterization 08/07/2016:  There is mild left ventricular systolic dysfunction.  The left ventricular ejection fraction is 45-50% by visual estimate.  There is no mitral valve regurgitation.  LV end diastolic pressure is mildly elevated.  Mid RCA lesion, 100 %stenosed.  Dist RCA lesion, 75 %stenosed.  Mid LAD-2 lesion, 60 %stenosed.  Mid LAD-1 lesion, 70 %stenosed.  Dist LAD-2 lesion, 45 %stenosed.  Dist LAD-1 lesion, 50 %stenosed.  2nd Mrg lesion, 40 %stenosed.  Moderate LV dysfunction with upper mid to basal inferior hypocontractility. Global ejection fraction is 45-50%.  Multivessel CAD with calcification of the proximal LAD with 70% followed by 60% stenoses between the first and second diagonal vessel with segmental 50% stenoses in the mid distal LAD; 40-50% diffuse narrowing in the distal circumflex marginal branch; an old total occlusion of the proximal RCA with bridging collaterals.  Assessment and Plan:    Current medicines were reviewed with the patient today.  No orders of the defined types were placed in this encounter.   Disposition:  Signed, Illene Bolus.  Diona Browner, MD, [facility] 06/21/2017 8:37 AM    [facility] at [facility] [address] [place], [place], [place] [address] Phone: [phone]; Fax: [phone]

## 2017-06-23 NOTE — Progress Notes (Signed)
Attempted to call pt. at friend, Debbie's phone number [phone]).  Spoke with Debbie.  Stated she will give pt. Message to call back to office.  Advised to have call back and ask for Okey Regal or Joyce Gross, one of the Surgery Schedulers.  (noted that pt. did not show up for her Cardiac appt. on 9/24; need cardiac clearance to schedule for Redo (R) CEA.)

## 2017-07-06 ENCOUNTER — Encounter: Payer: Self-pay | Admitting: Physician Assistant

## 2017-07-06 ENCOUNTER — Ambulatory Visit: Payer: Self-pay | Admitting: Physician Assistant

## 2017-07-06 VITALS — BP 200/140 | HR 62 | Temp 97.3°F | Ht 64.0 in | Wt 169.5 lb

## 2017-07-06 DIAGNOSIS — J209 Acute bronchitis, unspecified: Secondary | ICD-10-CM

## 2017-07-06 DIAGNOSIS — F17218 Nicotine dependence, cigarettes, with other nicotine-induced disorders: Secondary | ICD-10-CM

## 2017-07-06 DIAGNOSIS — I10 Essential (primary) hypertension: Secondary | ICD-10-CM

## 2017-07-06 DIAGNOSIS — I739 Peripheral vascular disease, unspecified: Secondary | ICD-10-CM

## 2017-07-06 DIAGNOSIS — I779 Disorder of arteries and arterioles, unspecified: Secondary | ICD-10-CM

## 2017-07-06 DIAGNOSIS — F1410 Cocaine abuse, uncomplicated: Secondary | ICD-10-CM

## 2017-07-06 MED ORDER — LISINOPRIL 20 MG PO TABS: 40.0000 mg | ORAL_TABLET | Freq: Every day | ORAL | 1 refills | Status: DC

## 2017-07-06 MED ORDER — METOPROLOL TARTRATE 100 MG PO TABS: 200.0000 mg | ORAL_TABLET | Freq: Two times a day (BID) | ORAL | 1 refills | Status: DC

## 2017-07-06 MED ORDER — SULFAMETHOXAZOLE-TRIMETHOPRIM 800-160 MG PO TABS: 1.0000 | ORAL_TABLET | Freq: Two times a day (BID) | ORAL | 0 refills | Status: AC

## 2017-07-06 MED ORDER — PREDNISONE 50 MG PO TABS: 50.0000 mg | ORAL_TABLET | Freq: Every day | ORAL | 0 refills | Status: AC

## 2017-07-06 MED ORDER — ALBUTEROL SULFATE HFA 108 (90 BASE) MCG/ACT IN AERS
2.0000 | INHALATION_SPRAY | Freq: Four times a day (QID) | RESPIRATORY_TRACT | 0 refills | Status: DC | PRN
Start: 2017-07-06 — End: 2017-12-07

## 2017-07-06 NOTE — Progress Notes (Signed)
BP (!) 200/140 (BP Location: Left Arm, Patient Position: Sitting, Cuff Size: Normal)   Pulse 62   Temp (!) 97.3 F (36.3 C)   Ht  (1.626 m)   Wt 169 lb 8 oz (76.9 kg)   LMP 07/18/2011   SpO2 98%   BMI 29.09 kg/m    Subjective:    Patient ID: [name], female    DOB: 1961/01/06, 56 y.o.   MRN: [id number]  HPI: [name] is a 56 y.o. female presenting on 07/06/2017 for Hypertension (pt states she feels okay)   HPI   Pt just took bp meds before she got here today.  She is out of her lisinopril.  She is feeling well today except for some cough.   She denies HA, vision changes, chest pains.   Pt says she is stressed- her friend Eunice Blase is in the hopsital with sepsis.   Pt didn't make it to her cardiology appt but she has rescheduled to 07/28/17.    She says she isn't using cocaine twice/week now.  She used some Friday (today is Tuesday)  She is out of her lisinopril and almost out of her metoprolol.    She has bad cough for about a week.   She still smokes about 11 cigarettes/day.   Relevant past medical, surgical, family and social history reviewed and updated as indicated. Interim medical history since our last visit reviewed. Allergies and medications reviewed and updated.   Current Outpatient Prescriptions:  .  acetaminophen (TYLENOL) 325 MG tablet, Take 650-975 mg by mouth 3 (three) times daily as needed for moderate pain or headache., Disp: , Rfl:  .  amLODipine (NORVASC) 10 MG tablet, Take 1 tablet (10 mg total) by mouth daily., Disp: 90 tablet, Rfl: 1 .  aspirin EC 325 MG tablet, Take 325 mg by mouth daily., Disp: , Rfl:  .  atorvastatin (LIPITOR) 80 MG tablet, TAKE 1 Tablet BY MOUTH ONCE DAILY AT 6 PM, Disp: 90 tablet, Rfl: 1 .  cloNIDine (CATAPRES) 0.1 MG tablet, Take 1 tablet (0.1 mg total) by mouth 2 (two) times daily., Disp: 180 tablet, Rfl: 1 .  metoprolol tartrate (LOPRESSOR) 100 MG tablet, Take 2 tablets (200 mg total) by mouth 2 (two) times  daily., Disp: 120 tablet, Rfl: 1 .  NITROSTAT 0.4 MG SL tablet, PLACE 1 TABLET UNDER TONGUE AS NEEDED FOR CHEST PAIN EVERY 5 MINUTES X 3 MAX DOSES.  CALL 911 IF PAIN PERSISTS, Disp: 100 tablet, Rfl: 1 .  lisinopril (PRINIVIL,ZESTRIL) 20 MG tablet, Take 2 tablets (40 mg total) by mouth daily. (Patient not taking: Reported on 07/06/2017), Disp: 60 tablet, Rfl: 1   Review of Systems  Constitutional: Negative for appetite change, chills, diaphoresis, fatigue, fever and unexpected weight change.  HENT: Negative for congestion, dental problem, drooling, ear pain, facial swelling, hearing loss, mouth sores, sneezing, sore throat, trouble swallowing and voice change.   Eyes: Negative for pain, discharge, redness, itching and visual disturbance.  Respiratory: Negative for cough, choking, shortness of breath and wheezing.   Cardiovascular: Positive for leg swelling. Negative for chest pain and palpitations.  Gastrointestinal: Positive for constipation. Negative for abdominal pain, blood in stool, diarrhea and vomiting.  Endocrine: Negative for cold intolerance, heat intolerance and polydipsia.  Genitourinary: Negative for decreased urine volume, dysuria and hematuria.  Musculoskeletal: Positive for arthralgias. Negative for back pain and gait problem.  Skin: Negative for rash.  Allergic/Immunologic: Negative for environmental allergies.  Neurological: Negative for seizures, syncope,  light-headedness and headaches.  Hematological: Negative for adenopathy.  Psychiatric/Behavioral: Positive for dysphoric mood. Negative for agitation and suicidal ideas. The patient is nervous/anxious.     Per HPI unless specifically indicated above     Objective:    BP (!) 200/140 (BP Location: Left Arm, Patient Position: Sitting, Cuff Size: Normal)   Pulse 62   Temp (!) 97.3 F (36.3 C)   Ht  (1.626 m)   Wt 169 lb 8 oz (76.9 kg)   LMP 07/18/2011   SpO2 98%   BMI 29.09 kg/m   Wt Readings from Last 3  Encounters:  07/06/17 169 lb 8 oz (76.9 kg)  06/08/17 167 lb 12 oz (76.1 kg)  05/26/17 168 lb 8 oz (76.4 kg)    Physical Exam  Constitutional: She is oriented to person, place, and time. She appears well-developed and well-nourished.  HENT:  Head: Normocephalic and atraumatic.  Neck: Neck supple.  Cardiovascular: Normal rate and regular rhythm.   Pulmonary/Chest: Effort normal. No respiratory distress. She has wheezes. She has no rales.  Abdominal: Soft. Bowel sounds are normal. She exhibits no mass. There is no hepatosplenomegaly. There is no tenderness.  Musculoskeletal: She exhibits no edema.  Lymphadenopathy:    She has no cervical adenopathy.  Neurological: She is alert and oriented to person, place, and time.  Skin: Skin is warm and dry.  Psychiatric: She has a normal mood and affect. Her behavior is normal.  Vitals reviewed.        Assessment & Plan:    Encounter Diagnoses  Name Primary?  . Essential hypertension Yes  . Acute bronchitis, unspecified organism   . Cocaine abuse (HCC)   . Cigarette nicotine dependence with other nicotine-induced disorder   . Bilateral carotid artery disease, unspecified type (HCC)     -Discussed again with pt the importance of not running out of her meds because her BP is very very high.  She needs to get back on the lisinopril today.   Also sent rx metoprolol to [facility] so pt can make sure she doesn't run out.  Nurse will call [facility] to check on his bp meds there.  -rx prednisone and septra ds for bronchitis.  rx for albuterol inhaler sent to [facility] but pt cannot afford to get it locally.   Discussed with pt that she will get better faster if she abstains from smoking. -discussed with pt that she must stop the cocaine to get control of her bp and get her carotid surgery -Pt to go to cardiology appt later this month as scheduled -pt to follow up one month to recheck the bp.  RTO sooner if worsens or new symptoms.

## 2017-07-27 NOTE — Progress Notes (Signed)
Cardiology Office Note  Date: 07/28/2017   ID: [name], DOB [birth year], MRN [id number]  PCP: Jacquelin Hawking, PA-C  Primary Cardiologist: Nona Dell, MD   Chief Complaint  Patient presents with  . Preoperative cardiac evaluation    History of Present Illness: [name] is a medically complex and high risk 56 y.o. female last seen in June.  She is referred back to the office for preoperative cardiac assessment.  She is being followed by Dr. Edilia Bo for carotid artery disease with plan to consider redo right CEA due to 80% recurrent RICA stenosis and known occlusion of the LICA.  Surgery has been deferred in light of uncontrolled hypertension in the setting of continued cocaine use.  I reviewed interval records, office visit noted with Ms. Susa Raring on October 9.  At that time blood pressure was 200/140 and she did admit to continued use of cocaine, although not as frequently.  She presents today with much better controlled blood pressure, states that she has been taking her medications, and is on multimodal therapy for blood pressure control.  She states that she has not used cocaine in the last 3 weeks, still smokes marijuana.  She does not report any active angina symptoms.  Current cardiac regimen includes aspirin, Norvasc, Lipitor, clonidine, lisinopril, and Lopressor.  She denies any recent sublingual nitroglycerin use.  I personally reviewed her ECG today which shows a sinus bradycardia with old inferior infarct pattern and decreased R wave progression.  Today I discussed with her the critical importance of medication compliance, and abstaining from further drug use.  She is at high risk for adverse cardiac events.  Past Medical History:  Diagnosis Date  . AAA (abdominal aortic aneurysm) without rupture (HCC) 08/25/2016  . Anxiety   . Bipolar 1 disorder (HCC)   . CAD (coronary artery disease)    a. 08/08/16 LAD 70%, 60% 1st diag, CTO RCA Med Rx EF 55%    . Carotid artery stenosis    Right carotid endarterectomy 07/22/05 with recurrent stenosis 01/2017; known left ICA occlusion  . Crack cocaine use   . Depression   . Headache   . Hypertension   . Myocardial infarction (HCC)   . Right Groin Hematoma    a. 07/2016 following diagnostic cath  . Stroke (HCC)   . Tobacco abuse     Past Surgical History:  Procedure Laterality Date  . CARDIAC CATHETERIZATION N/A 08/07/2016   Procedure: Left Heart Cath and Coronary Angiography;  Surgeon: Lennette Bihari, MD;  Location: [facility] INVASIVE CV LAB;  Service: Cardiovascular;  Laterality: N/A;  . CYST REMOVAL TRUNK Left    beneath L breast  . TUBAL LIGATION    . VASCULAR SURGERY Right    right carotid endarterectomy 07/22/05     Current Outpatient Prescriptions  Medication Sig Dispense Refill  . acetaminophen (TYLENOL) 325 MG tablet Take 650-975 mg by mouth 3 (three) times daily as needed for moderate pain or headache.    . albuterol (PROVENTIL HFA;VENTOLIN HFA) 108 (90 Base) MCG/ACT inhaler Inhale 2 puffs into the lungs every 6 (six) hours as needed for wheezing or shortness of breath. 3 Inhaler 0  . amLODipine (NORVASC) 10 MG tablet Take 1 tablet (10 mg total) by mouth daily. 90 tablet 1  . aspirin EC 325 MG tablet Take 325 mg by mouth daily.    Marland Kitchen atorvastatin (LIPITOR) 80 MG tablet TAKE 1 Tablet BY MOUTH ONCE DAILY AT 6 PM 90 tablet 1  .  cloNIDine (CATAPRES) 0.1 MG tablet Take 1 tablet (0.1 mg total) by mouth 2 (two) times daily. 180 tablet 1  . lisinopril (PRINIVIL,ZESTRIL) 20 MG tablet Take 2 tablets (40 mg total) by mouth daily. 180 tablet 1  . metoprolol tartrate (LOPRESSOR) 100 MG tablet Take 2 tablets (200 mg total) by mouth 2 (two) times daily. 360 tablet 1  . NITROSTAT 0.4 MG SL tablet PLACE 1 TABLET UNDER TONGUE AS NEEDED FOR CHEST PAIN EVERY 5 MINUTES X 3 MAX DOSES.  CALL 911 IF PAIN PERSISTS 100 tablet 1   No current facility-administered medications for this visit.    Allergies:   Penicillins   Social History: The patient  reports that she has been smoking Cigarettes.  She has a 21.00 pack-year smoking history. She has never used smokeless tobacco. She reports that she uses drugs, including Marijuana and "Crack" cocaine. She reports that she does not drink alcohol.   Family History: The patient's family history includes COPD in her paternal uncle; Cancer in her maternal aunt, maternal uncle, and paternal uncle; Diabetes in her mother; Heart attack in her father and mother; Heart disease in her father and mother; Hypertension in her father and mother; Stroke in her mother.    ROS:  Please see the history of present illness. Otherwise, complete review of systems is positive for NYHA class II dyspnea.  All other systems are reviewed and negative.   Physical Exam: VS:  BP 138/88   Pulse (!) 57   Ht 5\' 4"  (1.626 m)   Wt 169 lb 12.8 oz (77 kg)   LMP 07/18/2011   SpO2 98%   BMI 29.15 kg/m , BMI Body mass index is 29.15 kg/m.  Wt Readings from Last 3 Encounters:  07/28/17 169 lb 12.8 oz (77 kg)  07/06/17 169 lb 8 oz (76.9 kg)  06/08/17 167 lb 12 oz (76.1 kg)    General: Chronically ill-appearing woman in no distress. HEENT: Conjunctiva and lids normal, oropharynx clear with poor dentition. Neck: Supple, no elevated JVP, bilateral carotid bruits, no thyromegaly. Lungs: No wheezes, nonlabored breathing at rest. Cardiac: Regular rate and rhythm, no S3, 2/6 systolic murmur, no pericardial rub. Abdomen: Soft, nontender, bowel sounds present, no guarding or rebound. Extremities: No pitting edema, distal pulses 1+. Skin: Warm and dry. Musculoskeletal: No kyphosis. Neuropsychiatric: Alert and oriented x3, affect grossly appropriate.  ECG: I personally reviewed the tracing from 08/25/2016 which showed sinus rhythm with decreased R wave progression rule out old anterior infarct pattern.  Recent Labwork: 08/06/2016: TSH 0.490 04/15/2017: Hemoglobin 14.1; Platelets  319 04/27/2017: ALT 20; AST 24; BUN 13; Creatinine, Ser 0.86; Potassium 3.8; Sodium 141     Component Value Date/Time   CHOL 168 04/15/2017 1252   TRIG 131 04/15/2017 1252   HDL 42 04/15/2017 1252   CHOLHDL 4.0 04/15/2017 1252   VLDL 26 04/15/2017 1252   LDLCALC 100 (H) 04/15/2017 1252    Other Studies Reviewed Today:  Echocardiogram 08/08/2016: Study Conclusions  - Left ventricle: The cavity size was normal. There was mild concentric hypertrophy. Systolic function was normal. The estimated ejection fraction was in the range of 55% to 60%. Wall motion was normal; there were no regional wall motion abnormalities. Doppler parameters are consistent with abnormal left ventricular relaxation (grade 1 diastolic dysfunction). - Left atrium: The atrium was mildly dilated. - Atrial septum: No defect or patent foramen ovale was identified.  Cardiac catheterization 08/07/2016:  There is mild left ventricular systolic dysfunction.  The left ventricular  ejection fraction is 45-50% by visual estimate.  There is no mitral valve regurgitation.  LV end diastolic pressure is mildly elevated.  Mid RCA lesion, 100 %stenosed.  Dist RCA lesion, 75 %stenosed.  Mid LAD-2 lesion, 60 %stenosed.  Mid LAD-1 lesion, 70 %stenosed.  Dist LAD-2 lesion, 45 %stenosed.  Dist LAD-1 lesion, 50 %stenosed.  2nd Mrg lesion, 40 %stenosed.  Moderate LV dysfunction with upper mid to basal inferior hypocontractility. Global ejection fraction is 45-50%.  Multivessel CAD with calcification of the proximal LAD with 70% followed by 60% stenoses between the first and second diagonal vessel with segmental 50% stenoses in the mid distal LAD; 40-50% diffuse narrowing in the distal circumflex marginal branch; an old total occlusion of the proximal RCA with bridging collaterals.  Assessment and Plan:  1.  Preoperative evaluation in a 56 year old woman with multivessel CAD as outlined above,  essential hypertension with fluctuating poor control, ongoing tobacco abuse, substance abuse including cocaine (states none in the last 3 weeks), peripheral arterial disease, intermittent medication noncompliance, and plan for carotid revascularization surgery.  She is clinically stable without angina at this time on medical therapy and at least on today's visit her blood pressure is better controlled.  I underscored the critical importance of medication compliance and abstaining from further substance abuse.  Plan to inform Dr. Adele Danickson's office regarding scheduling of her endarterectomy.  Patient is at high risk for adverse cardiac events, but this risk does not preclude proceeding with surgery.  2.  Multivessel CAD as outlined above.  Currently no active angina on medical therapy.  ECG reviewed and stable.  3.  Essential hypertension, blood pressure is controlled at today's visit.  I reviewed her medications and we discussed compliance.  4.  Ongoing tobacco abuse.  Smoking cessation has already been discussed.  5.  History of polysubstance abuse, marijuana and cocaine.  Critical importance of abstinence was discussed again today.  6.  Hyperlipidemia, on Lipitor.  Last LDL was 100.  Current medicines were reviewed with the patient today.   Orders Placed This Encounter  Procedures  . EKG 12-Lead    Disposition: Follow-up in 4 months.  Signed, Jonelle SidleSamuel G. Breckyn Troyer, MD, [facility] CenterFACC 07/28/2017 11:05 AM    [facility] [address], [place], [place] [address] Phone: [phone]; Fax: [phone]

## 2017-07-28 ENCOUNTER — Encounter: Payer: Self-pay | Admitting: Cardiology

## 2017-07-28 ENCOUNTER — Ambulatory Visit (INDEPENDENT_AMBULATORY_CARE_PROVIDER_SITE_OTHER): Payer: Self-pay | Admitting: Cardiology

## 2017-07-28 VITALS — BP 138/88 | HR 57 | Ht 64.0 in | Wt 169.8 lb

## 2017-07-28 DIAGNOSIS — F1910 Other psychoactive substance abuse, uncomplicated: Secondary | ICD-10-CM

## 2017-07-28 DIAGNOSIS — Z01810 Encounter for preprocedural cardiovascular examination: Secondary | ICD-10-CM

## 2017-07-28 DIAGNOSIS — Z720 Tobacco use: Secondary | ICD-10-CM

## 2017-07-28 DIAGNOSIS — I25119 Atherosclerotic heart disease of native coronary artery with unspecified angina pectoris: Secondary | ICD-10-CM

## 2017-07-28 DIAGNOSIS — I10 Essential (primary) hypertension: Secondary | ICD-10-CM

## 2017-07-28 DIAGNOSIS — E782 Mixed hyperlipidemia: Secondary | ICD-10-CM

## 2017-07-28 NOTE — Patient Instructions (Signed)
Medication Instructions:  Your physician recommends that you continue on your current medications as directed. Please refer to the Current Medication list given to you today.  Labwork: NONE  Testing/Procedures: NONE  Follow-Up: Your physician wants you to follow-up in: 4 MONTHS WITH DR. MCDOWELL. You will receive a reminder letter in the mail two months in advance. If you don't receive a letter, please call our office to schedule the follow-up appointment.  Any Other Special Instructions Will Be Listed Below (If Applicable).  If you need a refill on your cardiac medications before your next appointment, please call your pharmacy. 

## 2017-08-03 ENCOUNTER — Ambulatory Visit: Payer: Self-pay | Admitting: Physician Assistant

## 2017-08-27 ENCOUNTER — Other Ambulatory Visit: Payer: Self-pay | Admitting: Physician Assistant

## 2017-08-30 ENCOUNTER — Other Ambulatory Visit: Payer: Self-pay | Admitting: *Deleted

## 2017-08-30 MED ORDER — AMLODIPINE BESYLATE 10 MG PO TABS: 10.0000 mg | ORAL_TABLET | Freq: Every day | ORAL | 3 refills | Status: DC

## 2017-10-05 ENCOUNTER — Encounter: Payer: Self-pay | Admitting: Physician Assistant

## 2017-10-05 ENCOUNTER — Ambulatory Visit: Payer: Self-pay | Admitting: Physician Assistant

## 2017-10-05 VITALS — BP 206/120 | HR 100 | Temp 97.9°F | Ht 64.0 in | Wt 172.5 lb

## 2017-10-05 DIAGNOSIS — I739 Peripheral vascular disease, unspecified: Secondary | ICD-10-CM

## 2017-10-05 DIAGNOSIS — Z9119 Patient's noncompliance with other medical treatment and regimen: Secondary | ICD-10-CM

## 2017-10-05 DIAGNOSIS — F1410 Cocaine abuse, uncomplicated: Secondary | ICD-10-CM

## 2017-10-05 DIAGNOSIS — E785 Hyperlipidemia, unspecified: Secondary | ICD-10-CM

## 2017-10-05 DIAGNOSIS — I10 Essential (primary) hypertension: Secondary | ICD-10-CM

## 2017-10-05 DIAGNOSIS — I714 Abdominal aortic aneurysm, without rupture, unspecified: Secondary | ICD-10-CM

## 2017-10-05 DIAGNOSIS — I25110 Atherosclerotic heart disease of native coronary artery with unstable angina pectoris: Secondary | ICD-10-CM

## 2017-10-05 DIAGNOSIS — I779 Disorder of arteries and arterioles, unspecified: Secondary | ICD-10-CM

## 2017-10-05 DIAGNOSIS — Z91199 Patient's noncompliance with other medical treatment and regimen due to unspecified reason: Secondary | ICD-10-CM

## 2017-10-05 DIAGNOSIS — F17218 Nicotine dependence, cigarettes, with other nicotine-induced disorders: Secondary | ICD-10-CM

## 2017-10-05 MED ORDER — METOPROLOL TARTRATE 100 MG PO TABS: 200.0000 mg | ORAL_TABLET | Freq: Two times a day (BID) | ORAL | 1 refills | Status: DC

## 2017-10-05 NOTE — Progress Notes (Signed)
BP (!) 206/120 (BP Location: Left Arm, Patient Position: Sitting, Cuff Size: Normal)   Pulse 100   Temp 97.9 F (36.6 C)   Ht 5\' 4"  (1.626 m)   Wt 172 lb 8 oz (78.2 kg)   LMP 07/18/2011   SpO2 95%   BMI 29.61 kg/m    Subjective:    Patient ID: [name], female    DOB: Feb 04, 1961, 57 y.o.   MRN: [id number]  HPI: [name] is a 57 y.o. female presenting on 10/05/2017 for Follow-up   HPI  Pt has been out of all of her BP meds since Friday.  She says she was evicted month before last.  She relates a lot of stress.  She says she is in a good mood today because it is her birthday.  She says she did recertify with [facility]. She says she spoke with [facility] yesterday and was told that her meds should arrive any day now.   Pt says she is feeling well except she thinks she is getting a cold.  She denies CP, HA.    She says her last cocaine use was about 3 or 4 weeks ago.    Pt did not contact surgeon about her carotid endarterectomy after her cardiology appt on 10/31 because she is scared of the surgery.  Pt cancelled her appt here at [facility] that was scheduled in November.    Relevant past medical, surgical, family and social history reviewed and updated as indicated. Interim medical history since our last visit reviewed. Allergies and medications reviewed and updated.   Current Outpatient Medications:  .  acetaminophen (TYLENOL) 325 MG tablet, Take 650-975 mg by mouth 3 (three) times daily as needed for moderate pain or headache., Disp: , Rfl:  .  albuterol (PROVENTIL HFA;VENTOLIN HFA) 108 (90 Base) MCG/ACT inhaler, Inhale 2 puffs into the lungs every 6 (six) hours as needed for wheezing or shortness of breath., Disp: 3 Inhaler, Rfl: 0 .  aspirin EC 325 MG tablet, Take 325 mg by mouth daily., Disp: , Rfl:  .  atorvastatin (LIPITOR) 80 MG tablet, TAKE 1 Tablet BY MOUTH ONCE DAILY AT 6 PM, Disp: 90 tablet, Rfl: 1 .  amLODipine (NORVASC) 10 MG tablet, Take 1 tablet (10 mg  total) by mouth daily. (Patient not taking: Reported on 10/05/2017), Disp: 90 tablet, Rfl: 3 .  cloNIDine (CATAPRES) 0.1 MG tablet, Take 1 tablet (0.1 mg total) by mouth 2 (two) times daily. (Patient not taking: Reported on 10/05/2017), Disp: 180 tablet, Rfl: 1 .  lisinopril (PRINIVIL,ZESTRIL) 20 MG tablet, Take 2 tablets (40 mg total) by mouth daily. (Patient not taking: Reported on 10/05/2017), Disp: 180 tablet, Rfl: 1 .  metoprolol tartrate (LOPRESSOR) 100 MG tablet, Take 2 tablets (200 mg total) by mouth 2 (two) times daily. (Patient not taking: Reported on 10/05/2017), Disp: 360 tablet, Rfl: 1 .  NITROSTAT 0.4 MG SL tablet, PLACE 1 TABLET UNDER TONGUE AS NEEDED FOR CHEST PAIN EVERY 5 MINUTES X 3 MAX DOSES.  CALL 911 IF PAIN PERSISTS (Patient not taking: Reported on 10/05/2017), Disp: 100 tablet, Rfl: 1   Review of Systems  Constitutional: Negative for appetite change, chills, diaphoresis, fatigue, fever and unexpected weight change.  HENT: Positive for congestion. Negative for dental problem, drooling, ear pain, facial swelling, hearing loss, mouth sores, sneezing, sore throat, trouble swallowing and voice change.   Eyes: Negative for pain, discharge, redness, itching and visual disturbance.  Respiratory: Positive for cough, shortness of breath and  wheezing. Negative for choking.   Cardiovascular: Negative for chest pain, palpitations and leg swelling.  Gastrointestinal: Negative for abdominal pain, blood in stool, constipation, diarrhea and vomiting.  Endocrine: Negative for cold intolerance, heat intolerance and polydipsia.  Genitourinary: Negative for decreased urine volume, dysuria and hematuria.  Musculoskeletal: Positive for arthralgias. Negative for back pain and gait problem.  Skin: Negative for rash.  Allergic/Immunologic: Negative for environmental allergies.  Neurological: Positive for headaches. Negative for seizures, syncope and light-headedness.  Hematological: Negative for adenopathy.   Psychiatric/Behavioral: Positive for dysphoric mood. Negative for agitation and suicidal ideas. The patient is nervous/anxious.     Per HPI unless specifically indicated above     Objective:    BP (!) 206/120 (BP Location: Left Arm, Patient Position: Sitting, Cuff Size: Normal)   Pulse 100   Temp 97.9 F (36.6 C)   Ht 5\' 4"  (1.626 m)   Wt 172 lb 8 oz (78.2 kg)   LMP 07/18/2011   SpO2 95%   BMI 29.61 kg/m   Wt Readings from Last 3 Encounters:  10/05/17 172 lb 8 oz (78.2 kg)  07/28/17 169 lb 12.8 oz (77 kg)  07/06/17 169 lb 8 oz (76.9 kg)    Physical Exam  Constitutional: She is oriented to person, place, and time. She appears well-developed and well-nourished.  HENT:  Head: Normocephalic and atraumatic.  Neck: Neck supple.  Cardiovascular: Normal rate and regular rhythm.  Pulmonary/Chest: Effort normal and breath sounds normal.  Abdominal: Soft. Bowel sounds are normal. She exhibits no mass. There is no hepatosplenomegaly. There is no tenderness.  Musculoskeletal: She exhibits no edema.  Lymphadenopathy:    She has no cervical adenopathy.  Neurological: She is alert and oriented to person, place, and time.  Skin: Skin is warm and dry.  Psychiatric: She has a normal mood and affect. Her behavior is normal.  Vitals reviewed.       Assessment & Plan:    Encounter Diagnoses  Name Primary?  . Essential hypertension Yes  . Bilateral carotid artery disease, unspecified type (HCC)   . Dyslipidemia   . Personal history of noncompliance with medical treatment, presenting hazards to health   . AAA (abdominal aortic aneurysm) without rupture (HCC)   . Cocaine abuse (HCC)   . Cigarette nicotine dependence with other nicotine-induced disorder   . Coronary artery disease involving native coronary artery of native heart with unstable angina pectoris (HCC)     -discussed with pt how important it is for her to not run out of her BP medications as it could lead to stoke, MI or  other.  She says she doesn't have any money to get her meds locally but I suggested maybe someone could get some of her bp meds for her birthday, she said she thought she could do one medication that way.  Rx metoprolol sent to local pharmacy.  Urged her to get on it today -Check fasting labs -again counseled pt on how important it is for her to avoid cocaine, particularly with her BP being elevated like it is. She states understanding -encouraged pt to contact the surgeon to get her surgery scheduled -discussed with pt that if she has any CP or HA, she needs to go to the ER.  She states understanding -pt due for repeat US to recheck AAA -pt to follow up here 1 month to recheck bp.  RTO sooner prn

## 2017-10-13 ENCOUNTER — Other Ambulatory Visit (HOSPITAL_COMMUNITY)
Admission: RE | Admit: 2017-10-13 | Discharge: 2017-10-13 | Disposition: A | Discharge status: Home / Self Care | Payer: Self-pay | Source: Ambulatory Visit | Attending: Physician Assistant | Admitting: Physician Assistant

## 2017-10-13 ENCOUNTER — Ambulatory Visit (HOSPITAL_COMMUNITY)
Admission: RE | Admit: 2017-10-13 | Discharge: 2017-10-13 | Disposition: A | Discharge status: Home / Self Care | Payer: Self-pay | Source: Ambulatory Visit | Attending: Physician Assistant | Admitting: Physician Assistant

## 2017-10-13 DIAGNOSIS — I779 Disorder of arteries and arterioles, unspecified: Secondary | ICD-10-CM | POA: Insufficient documentation

## 2017-10-13 DIAGNOSIS — E785 Hyperlipidemia, unspecified: Secondary | ICD-10-CM | POA: Insufficient documentation

## 2017-10-13 DIAGNOSIS — I739 Peripheral vascular disease, unspecified: Secondary | ICD-10-CM

## 2017-10-13 DIAGNOSIS — I714 Abdominal aortic aneurysm, without rupture, unspecified: Secondary | ICD-10-CM

## 2017-10-13 DIAGNOSIS — I10 Essential (primary) hypertension: Secondary | ICD-10-CM | POA: Insufficient documentation

## 2017-10-13 DIAGNOSIS — N281 Cyst of kidney, acquired: Secondary | ICD-10-CM | POA: Insufficient documentation

## 2017-10-13 LAB — COMPREHENSIVE METABOLIC PANEL
ALT: 32 U/L (ref 14–54)
ANION GAP: 12 (ref 5–15)
AST: 22 U/L (ref 15–41)
Albumin: 3.1 g/dL — ABNORMAL LOW (ref 3.5–5.0)
Alkaline Phosphatase: 86 U/L (ref 38–126)
BUN: 13 mg/dL (ref 6–20)
CHLORIDE: 105 mmol/L (ref 101–111)
CO2: 22 mmol/L (ref 22–32)
Calcium: 8.6 mg/dL — ABNORMAL LOW (ref 8.9–10.3)
Creatinine, Ser: 0.65 mg/dL (ref 0.44–1.00)
GFR calc non Af Amer: >60 mL/min (ref >60)
Glucose, Bld: 110 mg/dL — ABNORMAL HIGH (ref 65–99)
Potassium: 3.3 mmol/L — ABNORMAL LOW (ref 3.5–5.1)
SODIUM: 139 mmol/L (ref 135–145)
Total Bilirubin: 0.5 mg/dL (ref 0.3–1.2)
Total Protein: 6.6 g/dL (ref 6.5–8.1)

## 2017-10-13 LAB — LIPID PANEL
CHOL/HDL RATIO: 3.4 ratio
Cholesterol: 124 mg/dL (ref 0–200)
HDL: 37 mg/dL — AB (ref >40)
LDL CALC: 67 mg/dL (ref 0–99)
Triglycerides: 101 mg/dL (ref <150)
VLDL: 20 mg/dL (ref 0–40)

## 2017-11-03 ENCOUNTER — Other Ambulatory Visit: Payer: Self-pay | Admitting: Physician Assistant

## 2017-11-04 ENCOUNTER — Other Ambulatory Visit: Payer: Self-pay | Admitting: Physician Assistant

## 2017-11-09 ENCOUNTER — Ambulatory Visit: Payer: Self-pay | Admitting: Physician Assistant

## 2017-11-09 ENCOUNTER — Encounter: Payer: Self-pay | Admitting: Physician Assistant

## 2017-11-09 VITALS — BP 192/106 | HR 54 | Ht 64.0 in | Wt 168.0 lb

## 2017-11-09 DIAGNOSIS — E785 Hyperlipidemia, unspecified: Secondary | ICD-10-CM

## 2017-11-09 DIAGNOSIS — E876 Hypokalemia: Secondary | ICD-10-CM

## 2017-11-09 DIAGNOSIS — I779 Disorder of arteries and arterioles, unspecified: Secondary | ICD-10-CM

## 2017-11-09 DIAGNOSIS — Z9119 Patient's noncompliance with other medical treatment and regimen: Secondary | ICD-10-CM

## 2017-11-09 DIAGNOSIS — I714 Abdominal aortic aneurysm, without rupture, unspecified: Secondary | ICD-10-CM

## 2017-11-09 DIAGNOSIS — I739 Peripheral vascular disease, unspecified: Secondary | ICD-10-CM

## 2017-11-09 DIAGNOSIS — I10 Essential (primary) hypertension: Secondary | ICD-10-CM

## 2017-11-09 DIAGNOSIS — Z91199 Patient's noncompliance with other medical treatment and regimen due to unspecified reason: Secondary | ICD-10-CM

## 2017-11-09 DIAGNOSIS — F1410 Cocaine abuse, uncomplicated: Secondary | ICD-10-CM

## 2017-11-09 DIAGNOSIS — F17218 Nicotine dependence, cigarettes, with other nicotine-induced disorders: Secondary | ICD-10-CM

## 2017-11-09 DIAGNOSIS — I2510 Atherosclerotic heart disease of native coronary artery without angina pectoris: Secondary | ICD-10-CM

## 2017-11-09 NOTE — Progress Notes (Signed)
BP (!) 192/106 (BP Location: Left Arm, Patient Position: Sitting, Cuff Size: Normal)   Pulse (!) 54   Ht 5\' 4"  (1.626 m)   Wt 168 lb (76.2 kg)   LMP 07/18/2011   SpO2 97%   BMI 28.84 kg/m    Subjective:    Patient ID: [name], female    DOB: 07-20-1961, 57 y.o.   MRN: [id number]  HPI: [name] is a 57 y.o. female presenting on 11/09/2017 for Hypertension and Emesis (pt states she vomitted this morning)   HPI   Pt says she has been back on her medicine about 8 days.  She had been out for a long time before that.   She says her last cocaine was about 2 weeks ago.   She is still smoking.   Pt threw up one time this morning.  She thinks it is because of the salmon cake she ate last night.   She says she is feeling better now.   Relevant past medical, surgical, family and social history reviewed and updated as indicated. Interim medical history since our last visit reviewed. Allergies and medications reviewed and updated.   Current Outpatient Medications:  .  acetaminophen (TYLENOL) 325 MG tablet, Take 650-975 mg by mouth 3 (three) times daily as needed for moderate pain or headache., Disp: , Rfl:  .  albuterol (PROVENTIL HFA;VENTOLIN HFA) 108 (90 Base) MCG/ACT inhaler, Inhale 2 puffs into the lungs every 6 (six) hours as needed for wheezing or shortness of breath., Disp: 3 Inhaler, Rfl: 0 .  amLODipine (NORVASC) 10 MG tablet, Take 1 tablet (10 mg total) by mouth daily., Disp: 90 tablet, Rfl: 3 .  aspirin EC 325 MG tablet, Take 325 mg by mouth daily., Disp: , Rfl:  .  atorvastatin (LIPITOR) 80 MG tablet, TAKE 1 Tablet BY MOUTH ONCE DAILY AT 6 PM, Disp: 90 tablet, Rfl: 0 .  cloNIDine (CATAPRES) 0.1 MG tablet, TAKE 1 Tablet  BY MOUTH TWICE DAILY, Disp: 180 tablet, Rfl: 0 .  lisinopril (PRINIVIL,ZESTRIL) 40 MG tablet, Take 40 mg by mouth daily., Disp: , Rfl:  .  metoprolol tartrate (LOPRESSOR) 100 MG tablet, Take 2 tablets (200 mg total) by mouth 2 (two) times daily.,  Disp: 120 tablet, Rfl: 1 .  NITROSTAT 0.4 MG SL tablet, PLACE 1 TABLET UNDER TONGUE AS NEEDED FOR CHEST PAIN EVERY 5 MINUTES X 3 MAX DOSES.  CALL 911 IF PAIN PERSISTS, Disp: 100 tablet, Rfl: 0   Review of Systems  Constitutional: Negative for appetite change, chills, diaphoresis, fatigue, fever and unexpected weight change.  HENT: Positive for congestion. Negative for dental problem, drooling, ear pain, facial swelling, hearing loss, mouth sores, sneezing, sore throat, trouble swallowing and voice change.   Eyes: Negative for pain, discharge, redness, itching and visual disturbance.  Respiratory: Positive for cough. Negative for choking, shortness of breath and wheezing.   Cardiovascular: Negative for chest pain, palpitations and leg swelling.  Gastrointestinal: Positive for constipation. Negative for abdominal pain, blood in stool, diarrhea and vomiting.  Endocrine: Negative for cold intolerance, heat intolerance and polydipsia.  Genitourinary: Negative for decreased urine volume, dysuria and hematuria.  Musculoskeletal: Positive for arthralgias. Negative for back pain and gait problem.  Skin: Negative for rash.  Allergic/Immunologic: Negative for environmental allergies.  Neurological: Positive for headaches. Negative for seizures, syncope and light-headedness.  Hematological: Negative for adenopathy.  Psychiatric/Behavioral: Positive for dysphoric mood. Negative for agitation and suicidal ideas. The patient is nervous/anxious.  Per HPI unless specifically indicated above     Objective:    BP (!) 192/106 (BP Location: Left Arm, Patient Position: Sitting, Cuff Size: Normal)   Pulse (!) 54   Ht 5\' 4"  (1.626 m)   Wt 168 lb (76.2 kg)   LMP 07/18/2011   SpO2 97%   BMI 28.84 kg/m   Wt Readings from Last 3 Encounters:  11/09/17 168 lb (76.2 kg)  10/05/17 172 lb 8 oz (78.2 kg)  07/28/17 169 lb 12.8 oz (77 kg)    Physical Exam  Constitutional: She is oriented to person, place, and  time. She appears well-developed and well-nourished.  HENT:  Head: Normocephalic and atraumatic.  Neck: Neck supple.  Cardiovascular: Normal rate and regular rhythm.  Pulmonary/Chest: Effort normal and breath sounds normal.  Abdominal: Soft. Bowel sounds are normal. She exhibits pulsatile midline mass. She exhibits no distension and no mass. There is no hepatosplenomegaly. There is no tenderness. There is no rigidity and no guarding.  Musculoskeletal: She exhibits no edema.  Lymphadenopathy:    She has no cervical adenopathy.  Neurological: She is alert and oriented to person, place, and time.  Skin: Skin is warm and dry.  Psychiatric: She has a normal mood and affect. Her behavior is normal.  Vitals reviewed.   Results for orders placed or performed during the hospital encounter of 10/13/17  Lipid panel  Result Value Ref Range   Cholesterol 124 0 - 200 mg/dL   Triglycerides 409101 <811<150 mg/dL   HDL 37 (L) >91>40 mg/dL   Total CHOL/HDL Ratio 3.4 RATIO   VLDL 20 0 - 40 mg/dL   LDL Cholesterol 67 0 - 99 mg/dL  Comprehensive metabolic panel  Result Value Ref Range   Sodium 139 135 - 145 mmol/L   Potassium 3.3 (L) 3.5 - 5.1 mmol/L   Chloride 105 101 - 111 mmol/L   CO2 22 22 - 32 mmol/L   Glucose, Bld 110 (H) 65 - 99 mg/dL   BUN 13 6 - 20 mg/dL   Creatinine, Ser 4.780.65 0.44 - 1.00 mg/dL   Calcium 8.6 (L) 8.9 - 10.3 mg/dL   Total Protein 6.6 6.5 - 8.1 g/dL   Albumin 3.1 (L) 3.5 - 5.0 g/dL   AST 22 15 - 41 U/L   ALT 32 14 - 54 U/L   Alkaline Phosphatase 86 38 - 126 U/L   Total Bilirubin 0.5 0.3 - 1.2 mg/dL   GFR calc non Af Amer >60 >60 mL/min   GFR calc Af Amer >60 >60 mL/min   Anion gap 12 5 - 15      Assessment & Plan:    Encounter Diagnoses  Name Primary?  . Essential hypertension Yes  . Hypokalemia   . Dyslipidemia   . Bilateral carotid artery disease, unspecified type (HCC)   . AAA (abdominal aortic aneurysm) without rupture (HCC)   . Cocaine abuse (HCC)   . Cigarette  nicotine dependence with other nicotine-induced disorder   . Personal history of noncompliance with medical treatment, presenting hazards to health   . Coronary artery disease involving native coronary artery of native heart without angina pectoris     -reviewed labs with pt -pt will need to get recheck K+ this week as it was mildly low 1 month ago -pt is encouraged to call surgeon back to get follow up to see about getting her CEA surgery done.  Discussed with pt the need for the surgery and that she will need to be  cocaine free and have her bp down some.  She is given the phone number for that office. -reviewed results of abdominal US with pt.  AAA is stable -pt is encouraged to abstain from cocaine -pt to follow up 1 month to recheck BP.  RTO sooner prn

## 2017-12-07 ENCOUNTER — Other Ambulatory Visit: Payer: Self-pay | Admitting: Physician Assistant

## 2017-12-07 ENCOUNTER — Encounter: Payer: Self-pay | Admitting: Physician Assistant

## 2017-12-07 ENCOUNTER — Ambulatory Visit: Payer: Self-pay | Admitting: Physician Assistant

## 2017-12-07 VITALS — BP 172/100 | HR 56 | Temp 97.3°F | Ht 64.0 in | Wt 169.2 lb

## 2017-12-07 DIAGNOSIS — I10 Essential (primary) hypertension: Secondary | ICD-10-CM

## 2017-12-07 DIAGNOSIS — E876 Hypokalemia: Secondary | ICD-10-CM

## 2017-12-07 MED ORDER — ALBUTEROL SULFATE HFA 108 (90 BASE) MCG/ACT IN AERS: 2.0000 | INHALATION_SPRAY | Freq: Four times a day (QID) | RESPIRATORY_TRACT | 0 refills | Status: DC | PRN

## 2017-12-07 MED ORDER — ATORVASTATIN CALCIUM 80 MG PO TABS: ORAL_TABLET | ORAL | 0 refills | Status: DC

## 2017-12-07 MED ORDER — AMLODIPINE BESYLATE 10 MG PO TABS: 10.0000 mg | ORAL_TABLET | Freq: Every day | ORAL | 1 refills | Status: DC

## 2017-12-07 MED ORDER — METOPROLOL TARTRATE 100 MG PO TABS: 200.0000 mg | ORAL_TABLET | Freq: Two times a day (BID) | ORAL | 1 refills | Status: DC

## 2017-12-07 MED ORDER — CLONIDINE HCL 0.2 MG PO TABS: 0.2000 mg | ORAL_TABLET | Freq: Two times a day (BID) | ORAL | 1 refills | Status: DC

## 2017-12-07 MED ORDER — LISINOPRIL 40 MG PO TABS: 40.0000 mg | ORAL_TABLET | Freq: Every day | ORAL | 1 refills | Status: DC

## 2017-12-07 NOTE — Progress Notes (Signed)
BP (!) 172/100 (BP Location: Right Arm, Patient Position: Sitting, Cuff Size: Normal)   Pulse (!) 56   Temp (!) 97.3 F (36.3 C)   Ht 5\' 4"  (1.626 m)   Wt 169 lb 4 oz (76.8 kg)   LMP 07/18/2011   SpO2 98%   BMI 29.05 kg/m    Subjective:    Patient ID: [name], female    DOB: 02/26/1961, 57 y.o.   MRN: [id number]  HPI: [name] is a 57 y.o. female presenting on 12/07/2017 for Hypertension   HPI   -Recall letter was sent end of february from cardiology- pt says she didn't get it  -She has appointment with vascular surgeron April 10  -She states no cocaine this month. She says she Used one time in february  -Pt states she has been taking her meds regularly since she was seen here last month.   She feels good today.  Denies HA, CP, vision changes.  Relevant past medical, surgical, family and social history reviewed and updated as indicated. Interim medical history since our last visit reviewed. Allergies and medications reviewed and updated.   Current Outpatient Medications:  .  acetaminophen (TYLENOL) 325 MG tablet, Take 650-975 mg by mouth 3 (three) times daily as needed for moderate pain or headache., Disp: , Rfl:  .  albuterol (PROVENTIL HFA;VENTOLIN HFA) 108 (90 Base) MCG/ACT inhaler, Inhale 2 puffs into the lungs every 6 (six) hours as needed for wheezing or shortness of breath., Disp: 3 Inhaler, Rfl: 0 .  amLODipine (NORVASC) 10 MG tablet, Take 1 tablet (10 mg total) by mouth daily., Disp: 90 tablet, Rfl: 3 .  aspirin EC 325 MG tablet, Take 325 mg by mouth daily., Disp: , Rfl:  .  atorvastatin (LIPITOR) 80 MG tablet, TAKE 1 Tablet BY MOUTH ONCE DAILY AT 6 PM, Disp: 90 tablet, Rfl: 0 .  cloNIDine (CATAPRES) 0.1 MG tablet, TAKE 1 Tablet  BY MOUTH TWICE DAILY, Disp: 180 tablet, Rfl: 0 .  lisinopril (PRINIVIL,ZESTRIL) 40 MG tablet, Take 40 mg by mouth daily., Disp: , Rfl:  .  metoprolol tartrate (LOPRESSOR) 100 MG tablet, Take 2 tablets (200 mg total) by mouth  2 (two) times daily., Disp: 120 tablet, Rfl: 1 .  NITROSTAT 0.4 MG SL tablet, PLACE 1 TABLET UNDER TONGUE AS NEEDED FOR CHEST PAIN EVERY 5 MINUTES X 3 MAX DOSES.  CALL 911 IF PAIN PERSISTS, Disp: 100 tablet, Rfl: 0  Review of Systems  Constitutional: Negative for appetite change, chills, diaphoresis, fatigue, fever and unexpected weight change.  HENT: Positive for congestion. Negative for dental problem, drooling, ear pain, facial swelling, hearing loss, mouth sores, sneezing, sore throat, trouble swallowing and voice change.   Eyes: Negative for pain, discharge, redness, itching and visual disturbance.  Respiratory: Positive for cough. Negative for choking, shortness of breath and wheezing.   Cardiovascular: Negative for chest pain, palpitations and leg swelling.  Gastrointestinal: Negative for abdominal pain, blood in stool, constipation, diarrhea and vomiting.  Endocrine: Negative for cold intolerance, heat intolerance and polydipsia.  Genitourinary: Negative for decreased urine volume, dysuria and hematuria.  Musculoskeletal: Negative for arthralgias, back pain and gait problem.  Skin: Negative for rash.  Allergic/Immunologic: Negative for environmental allergies.  Neurological: Positive for headaches. Negative for seizures, syncope and light-headedness.  Hematological: Negative for adenopathy.  Psychiatric/Behavioral: Positive for dysphoric mood. Negative for agitation and suicidal ideas. The patient is nervous/anxious.     Per HPI unless specifically indicated above  Objective:    BP (!) 172/100 (BP Location: Right Arm, Patient Position: Sitting, Cuff Size: Normal)   Pulse (!) 56   Temp (!) 97.3 F (36.3 C)   Ht 5\' 4"  (1.626 m)   Wt 169 lb 4 oz (76.8 kg)   LMP 07/18/2011   SpO2 98%   BMI 29.05 kg/m   Wt Readings from Last 3 Encounters:  12/07/17 169 lb 4 oz (76.8 kg)  11/09/17 168 lb (76.2 kg)  10/05/17 172 lb 8 oz (78.2 kg)    Physical Exam  Constitutional: She is  oriented to person, place, and time. She appears well-developed and well-nourished.  HENT:  Head: Normocephalic and atraumatic.  Neck: Neck supple.  Cardiovascular: Normal rate and regular rhythm.  Pulmonary/Chest: Effort normal and breath sounds normal.  Abdominal: Soft. Bowel sounds are normal. She exhibits no mass. There is no hepatosplenomegaly. There is no tenderness.  Musculoskeletal: She exhibits no edema.  Lymphadenopathy:    She has no cervical adenopathy.  Neurological: She is alert and oriented to person, place, and time.  Skin: Skin is warm and dry.  Psychiatric: She has a normal mood and affect. Her behavior is normal.  Vitals reviewed.       Assessment & Plan:    Encounter Diagnoses  Name Primary?  . Essential hypertension Yes  . Hypokalemia      -Increase clonidine to 0.2 mg bid -Pt reminded to get labs drawn (to recheck K+) -reminded pt to avoid cocaine -pt to follow up 1 month to recheck BP.  RTO sooner prn

## 2018-01-04 ENCOUNTER — Ambulatory Visit: Payer: Self-pay | Admitting: Physician Assistant

## 2018-01-05 ENCOUNTER — Ambulatory Visit: Payer: Self-pay | Admitting: Vascular Surgery

## 2018-01-24 ENCOUNTER — Other Ambulatory Visit: Payer: Self-pay | Admitting: Physician Assistant

## 2018-01-26 ENCOUNTER — Ambulatory Visit (INDEPENDENT_AMBULATORY_CARE_PROVIDER_SITE_OTHER): Payer: Self-pay | Admitting: Vascular Surgery

## 2018-01-26 ENCOUNTER — Encounter: Payer: Self-pay | Admitting: Vascular Surgery

## 2018-01-26 ENCOUNTER — Encounter: Payer: Self-pay | Admitting: *Deleted

## 2018-01-26 ENCOUNTER — Other Ambulatory Visit: Payer: Self-pay | Admitting: *Deleted

## 2018-01-26 ENCOUNTER — Ambulatory Visit (HOSPITAL_COMMUNITY)
Admission: RE | Admit: 2018-01-26 | Discharge: 2018-01-26 | Disposition: A | Discharge status: Home / Self Care | Payer: Self-pay | Source: Ambulatory Visit | Attending: Vascular Surgery | Admitting: Vascular Surgery

## 2018-01-26 ENCOUNTER — Other Ambulatory Visit: Payer: Self-pay

## 2018-01-26 VITALS — BP 130/82 | HR 50 | Temp 97.2°F | Resp 18 | Ht 64.0 in | Wt 171.4 lb

## 2018-01-26 DIAGNOSIS — Z01812 Encounter for preprocedural laboratory examination: Secondary | ICD-10-CM

## 2018-01-26 DIAGNOSIS — I6521 Occlusion and stenosis of right carotid artery: Secondary | ICD-10-CM | POA: Insufficient documentation

## 2018-01-26 DIAGNOSIS — I6523 Occlusion and stenosis of bilateral carotid arteries: Secondary | ICD-10-CM

## 2018-01-26 NOTE — Progress Notes (Signed)
 Patient name: [name] MRN: [id number] DOB: 02/24/1961 Sex: female  REASON FOR VISIT:   To discuss surgery.  HPI:   [name] is a pleasant 57 y.o. female who I saw on 05/26/2017 with recurrent right carotid stenosis and a known left internal carotid artery occlusion.  Patient had a right carotid endarterectomy by Dr. Greg Hayes 12 years ago.  Given that the patient also had significant coronary disease and that this was a recurrent stenosis the patient was seen by Dr. Charles feels to evaluate for possible carotid stenting.  She was not felt to be a good candidate for carotid stenting.  Since I saw her last, she denies any history of stroke, TIAs, expressive or receptive aphasia, or amaurosis fugax.  She has moved several times which is why there has been a long delay in scheduling her surgery.  She presents today to discuss scheduling her surgery.  She has cut back to 10 cigarettes a day.  She states that she is clean except for smoking some marijuana.  She has not been doing cocaine which was an issue in the past.  The patient also has a known 4.1 cm infrarenal abdominal aortic aneurysm.  She  denies abdominal or back pain.    Past Medical History:  Diagnosis Date  . AAA (abdominal aortic aneurysm) without rupture (HCC) 08/25/2016  . Anxiety   . Bipolar 1 disorder (HCC)   . CAD (coronary artery disease)    a. 08/08/16 LAD 70%, 60% 1st diag, CTO RCA Med Rx EF 55%  . Carotid artery stenosis    Right carotid endarterectomy 07/22/05 with recurrent stenosis 01/2017; known left ICA occlusion  . Crack cocaine use   . Depression   . Headache   . Hypertension   . Myocardial infarction (HCC)   . Right Groin Hematoma    a. 07/2016 following diagnostic cath  . Stroke (HCC)   . Tobacco abuse     Family History  Problem Relation Age of Onset  . Heart attack Mother   . Stroke Mother   . Hypertension Mother   . Diabetes Mother   . Heart disease Mother   . Heart attack Father    . Hypertension Father   . Heart disease Father   . Cancer Maternal Aunt   . Cancer Maternal Uncle   . COPD Paternal Uncle        2 pat uncles with COPD  . Cancer Paternal Uncle        1 pat uncle with cancer    SOCIAL HISTORY: Social History   Tobacco Use  . Smoking status: Current Every Day Smoker    Packs/day: 0.50    Years: 42.00    Pack years: 21.00    Types: Cigarettes  . Smokeless tobacco: Never Used  . Tobacco comment: 10 cigarettes per day  Substance Use Topics  . Alcohol use: No    Allergies  Allergen Reactions  . Penicillins Hives and Swelling    Has patient had a PCN reaction causing immediate rash, facial/tongue/throat swelling, SOB or lightheadedness with hypotension: Yes Has patient had a PCN reaction causing severe rash involving mucus membranes or skin necrosis: Yes Has patient had a PCN reaction that required hospitalization No Has patient had a PCN reaction occurring within the last 10 years: Non If all of the above answers are "NO", then may proceed with Cephalosporin use.     Current Outpatient Medications  Medication Sig Dispense Refill  . acetaminophen (  TYLENOL) 325 MG tablet Take 650-975 mg by mouth 3 (three) times daily as needed for moderate pain or headache.    . amLODipine (NORVASC) 10 MG tablet Take 1 tablet (10 mg total) by mouth daily. 90 tablet 1  . aspirin EC 325 MG tablet Take 325 mg by mouth daily.    . atorvastatin (LIPITOR) 80 MG tablet TAKE 1 Tablet BY MOUTH ONCE DAILY AT 6 PM 90 tablet 0  . cloNIDine (CATAPRES) 0.2 MG tablet Take 1 tablet (0.2 mg total) by mouth 2 (two) times daily. 180 tablet 1  . lisinopril (PRINIVIL,ZESTRIL) 40 MG tablet Take 1 tablet (40 mg total) by mouth daily. 90 tablet 1  . metoprolol tartrate (LOPRESSOR) 100 MG tablet Take 2 tablets (200 mg total) by mouth 2 (two) times daily. 360 tablet 1  . NITROSTAT 0.4 MG SL tablet PLACE 1 TABLET UNDER TONGUE AS NEEDED FOR CHEST PAIN EVERY 5 MINUTES X 3 MAX DOSES.   CALL 911 IF PAIN PERSISTS 100 tablet 0  . PROVENTIL HFA 108 (90 Base) MCG/ACT inhaler INHALE 2 PUFFS BY MOUTH EVERY 6 HOURS AS NEEDED FOR COUGHING, WHEEZING, OR SHORTNESS OF BREATH 20.1 g 1   No current facility-administered medications for this visit.     REVIEW OF SYSTEMS:  [X] denotes positive finding, [ ] denotes negative finding Cardiac  Comments:  Chest pain or chest pressure:    Shortness of breath upon exertion:    Short of breath when lying flat:    Irregular heart rhythm:        Vascular    Pain in calf, thigh, or hip brought on by ambulation:    Pain in feet at night that wakes you up from your sleep:     Blood clot in your veins:    Leg swelling:         Pulmonary    Oxygen at home:    Productive cough:     Wheezing:         Neurologic    Sudden weakness in arms or legs:     Sudden numbness in arms or legs:     Sudden onset of difficulty speaking or slurred speech:    Temporary loss of vision in one eye:     Problems with dizziness:         Gastrointestinal    Blood in stool:     Vomited blood:         Genitourinary    Burning when urinating:     Blood in urine:        Psychiatric    Major depression:         Hematologic    Bleeding problems:    Problems with blood clotting too easily:        Skin    Rashes or ulcers:        Constitutional    Fever or chills:     PHYSICAL EXAM:   Vitals:   01/26/18 0936 01/26/18 0938  BP: 126/80 130/82  Pulse: (!) 50   Resp: 18   Temp: (!) 97.2 F (36.2 C)   TempSrc: Oral   SpO2: 99%   Weight: 171 lb 6.4 oz (77.7 kg)   Height: 5' 4" (1.626 m)     GENERAL: The patient is a well-nourished female, in no acute distress. The vital signs are documented above. CARDIAC: There is a regular rate and rhythm.  VASCULAR: She has a right carotid bruit. PULMONARY: There is good   air exchange bilaterally without wheezing or rales. ABDOMEN: Soft and non-tender with normal pitched bowel sounds.  MUSCULOSKELETAL: There  are no major deformities or cyanosis. NEUROLOGIC: No focal weakness or paresthesias are detected. SKIN: There are no ulcers or rashes noted. PSYCHIATRIC: The patient has a normal affect.  DATA:    CT ANGIOGRAM OF THE NECK: Based on the CT angiogram of the neck this stenosis does appear to be surgically accessible.  Duplex shows a greater than 80% recurrent right carotid stenosis with a peak systolic velocity of 398 cm/s and end-diastolic velocity 129 cm/s.   CAROTID DUPLEX: Given that she had not had a duplex since August, I recommended a limited carotid duplex on the right side to be sure that the artery was still patent.  I have independently interpreted the study today.  Her carotid duplex scan today shows a greater than 80% right carotid stenosis.  The velocities have not changed significantly compared to the previous study.  MEDICAL ISSUES:   ASYMPTOMATIC GREATER THAN 80% RIGHT CAROTID STENOSIS: Given the severity of the recurrent right carotid stenosis I have recommended right carotid endarterectomy in order to lower her risk of future stroke.  She is on aspirin and is on a statin. I have reviewed the indications for carotid endarterectomy, that is to lower the risk of future stroke. I have also reviewed the potential complications of surgery, including but not limited to: bleeding, stroke (perioperative risk 1-2%), MI, nerve injury of other unpredictable medical problems. All of the patients questions were answered and they are agreeable to proceed with surgery.  Her surgery is scheduled for 02/08/2018.  Ibraheem Voris [facility] [phone] 

## 2018-01-26 NOTE — H&P (View-Only) (Signed)
Patient name: [name] MRN: [id number] DOB: September 24, 1961 Sex: female  REASON FOR VISIT:   To discuss surgery.  HPI:   [name] is a pleasant 57 y.o. female who I saw on 05/26/2017 with recurrent right carotid stenosis and a known left internal carotid artery occlusion.  Patient had a right carotid endarterectomy by Dr. Liliane Bade 12 years ago.  Given that the patient also had significant coronary disease and that this was a recurrent stenosis the patient was seen by Dr. Leonette Most feels to evaluate for possible carotid stenting.  She was not felt to be a good candidate for carotid stenting.  Since I saw her last, she denies any history of stroke, TIAs, expressive or receptive aphasia, or amaurosis fugax.  She has moved several times which is why there has been a long delay in scheduling her surgery.  She presents today to discuss scheduling her surgery.  She has cut back to 10 cigarettes a day.  She states that she is clean except for smoking some marijuana.  She has not been doing cocaine which was an issue in the past.  The patient also has a known 4.1 cm infrarenal abdominal aortic aneurysm.  She  denies abdominal or back pain.    Past Medical History:  Diagnosis Date  . AAA (abdominal aortic aneurysm) without rupture (HCC) 08/25/2016  . Anxiety   . Bipolar 1 disorder (HCC)   . CAD (coronary artery disease)    a. 08/08/16 LAD 70%, 60% 1st diag, CTO RCA Med Rx EF 55%  . Carotid artery stenosis    Right carotid endarterectomy 07/22/05 with recurrent stenosis 01/2017; known left ICA occlusion  . Crack cocaine use   . Depression   . Headache   . Hypertension   . Myocardial infarction (HCC)   . Right Groin Hematoma    a. 07/2016 following diagnostic cath  . Stroke (HCC)   . Tobacco abuse     Family History  Problem Relation Age of Onset  . Heart attack Mother   . Stroke Mother   . Hypertension Mother   . Diabetes Mother   . Heart disease Mother   . Heart attack Father    . Hypertension Father   . Heart disease Father   . Cancer Maternal Aunt   . Cancer Maternal Uncle   . COPD Paternal Uncle        2 pat uncles with COPD  . Cancer Paternal Uncle        1 pat uncle with cancer    SOCIAL HISTORY: Social History   Tobacco Use  . Smoking status: Current Every Day Smoker    Packs/day: 0.50    Years: 42.00    Pack years: 21.00    Types: Cigarettes  . Smokeless tobacco: Never Used  . Tobacco comment: 10 cigarettes per day  Substance Use Topics  . Alcohol use: No    Allergies  Allergen Reactions  . Penicillins Hives and Swelling    Has patient had a PCN reaction causing immediate rash, facial/tongue/throat swelling, SOB or lightheadedness with hypotension: Yes Has patient had a PCN reaction causing severe rash involving mucus membranes or skin necrosis: Yes Has patient had a PCN reaction that required hospitalization No Has patient had a PCN reaction occurring within the last 10 years: Non If all of the above answers are "NO", then may proceed with Cephalosporin use.     Current Outpatient Medications  Medication Sig Dispense Refill  . acetaminophen (  TYLENOL) 325 MG tablet Take 650-975 mg by mouth 3 (three) times daily as needed for moderate pain or headache.    Marland Kitchen amLODipine (NORVASC) 10 MG tablet Take 1 tablet (10 mg total) by mouth daily. 90 tablet 1  . aspirin EC 325 MG tablet Take 325 mg by mouth daily.    Marland Kitchen atorvastatin (LIPITOR) 80 MG tablet TAKE 1 Tablet BY MOUTH ONCE DAILY AT 6 PM 90 tablet 0  . cloNIDine (CATAPRES) 0.2 MG tablet Take 1 tablet (0.2 mg total) by mouth 2 (two) times daily. 180 tablet 1  . lisinopril (PRINIVIL,ZESTRIL) 40 MG tablet Take 1 tablet (40 mg total) by mouth daily. 90 tablet 1  . metoprolol tartrate (LOPRESSOR) 100 MG tablet Take 2 tablets (200 mg total) by mouth 2 (two) times daily. 360 tablet 1  . NITROSTAT 0.4 MG SL tablet PLACE 1 TABLET UNDER TONGUE AS NEEDED FOR CHEST PAIN EVERY 5 MINUTES X 3 MAX DOSES.   CALL 911 IF PAIN PERSISTS 100 tablet 0  . PROVENTIL HFA 108 (90 Base) MCG/ACT inhaler INHALE 2 PUFFS BY MOUTH EVERY 6 HOURS AS NEEDED FOR COUGHING, WHEEZING, OR SHORTNESS OF BREATH 20.1 g 1   No current facility-administered medications for this visit.     REVIEW OF SYSTEMS:   denotes positive finding,  denotes negative finding Cardiac  Comments:  Chest pain or chest pressure:    Shortness of breath upon exertion:    Short of breath when lying flat:    Irregular heart rhythm:        Vascular    Pain in calf, thigh, or hip brought on by ambulation:    Pain in feet at night that wakes you up from your sleep:     Blood clot in your veins:    Leg swelling:         Pulmonary    Oxygen at home:    Productive cough:     Wheezing:         Neurologic    Sudden weakness in arms or legs:     Sudden numbness in arms or legs:     Sudden onset of difficulty speaking or slurred speech:    Temporary loss of vision in one eye:     Problems with dizziness:         Gastrointestinal    Blood in stool:     Vomited blood:         Genitourinary    Burning when urinating:     Blood in urine:        Psychiatric    Major depression:         Hematologic    Bleeding problems:    Problems with blood clotting too easily:        Skin    Rashes or ulcers:        Constitutional    Fever or chills:     PHYSICAL EXAM:   Vitals:   01/26/18 0936 01/26/18 0938  BP: 126/80 130/82  Pulse: (!) 50   Resp: 18   Temp: (!) 97.2 F (36.2 C)   TempSrc: Oral   SpO2: 99%   Weight: 171 lb 6.4 oz (77.7 kg)   Height:  (1.626 m)     GENERAL: The patient is a well-nourished female, in no acute distress. The vital signs are documented above. CARDIAC: There is a regular rate and rhythm.  VASCULAR: She has a right carotid bruit. PULMONARY: There is good  air exchange bilaterally without wheezing or rales. ABDOMEN: Soft and non-tender with normal pitched bowel sounds.  MUSCULOSKELETAL: There  are no major deformities or cyanosis. NEUROLOGIC: No focal weakness or paresthesias are detected. SKIN: There are no ulcers or rashes noted. PSYCHIATRIC: The patient has a normal affect.  DATA:    CT ANGIOGRAM OF THE NECK: Based on the CT angiogram of the neck this stenosis does appear to be surgically accessible.  Duplex shows a greater than 80% recurrent right carotid stenosis with a peak systolic velocity of 398 cm/s and end-diastolic velocity 129 cm/s.   CAROTID DUPLEX: Given that she had not had a duplex since August, I recommended a limited carotid duplex on the right side to be sure that the artery was still patent.  I have independently interpreted the study today.  Her carotid duplex scan today shows a greater than 80% right carotid stenosis.  The velocities have not changed significantly compared to the previous study.  MEDICAL ISSUES:   ASYMPTOMATIC GREATER THAN 80% RIGHT CAROTID STENOSIS: Given the severity of the recurrent right carotid stenosis I have recommended right carotid endarterectomy in order to lower her risk of future stroke.  She is on aspirin and is on a statin. I have reviewed the indications for carotid endarterectomy, that is to lower the risk of future stroke. I have also reviewed the potential complications of surgery, including but not limited to: bleeding, stroke (perioperative risk 1-2%), MI, nerve injury of other unpredictable medical problems. All of the patients questions were answered and they are agreeable to proceed with surgery.  Her surgery is scheduled for 02/08/2018.  Waverly Ferrari [facility] [phone]

## 2018-01-31 ENCOUNTER — Other Ambulatory Visit: Payer: Self-pay | Admitting: *Deleted

## 2018-01-31 NOTE — Pre-Procedure Instructions (Signed)
[name]  01/31/2018      [facility] [address], [place] - [address] [name] [place] [address] Phone: [phone] Fax: [phone]    Your procedure is scheduled on Tues. May 14  Report to [facility] Admitting at 5:30 A.M.  Call this number if you have problems the morning of surgery:  [phone]   Remember:  Do not eat food or drink liquids after midnight on Mon. May 13   Take these medicines the morning of surgery with A SIP OF WATER : tylenol if needed, amlodipine (norvasc), aspirin, clonidine (catapres), metoprolol(lopressor),proventilif needed--bring to hospital,nitroglycerine if needed              7 days prior to surgery STOP taking Aleve, Naproxen, Ibuprofen, Motrin, Advil, Goody's, BC's, all herbal medications, fish oil, and all vitamins   Do not wear jewelry, make-up or nail polish.  Do not wear lotions, powders, or perfumes, or deodorant.  Do not shave 48 hours prior to surgery.  Men may shave face and neck.  Do not bring valuables to the hospital.  [name] is not responsible for any belongings or valuables.  Contacts, dentures or bridgework may not be worn into surgery.  Leave your suitcase in the car.  After surgery it may be brought to your room.  For patients admitted to the hospital, discharge time will be determined by your treatment team.  Patients discharged the day of surgery will not be allowed to drive home.    Special instructions:  [place]- Preparing For Surgery  Before surgery, you can play an important role. Because skin is not sterile, your skin needs to be as free of germs as possible. You can reduce the number of germs on your skin by washing with CHG (chlorahexidine gluconate) Soap before surgery.  CHG is an antiseptic cleaner which kills germs and bonds with the skin to continue killing germs even after washing.  Please do not use if you have an allergy to CHG or antibacterial soaps. If your skin  becomes reddened/irritated stop using the CHG.  Do not shave (including legs and underarms) for at least 48 hours prior to first CHG shower. It is OK to shave your face.  Please follow these instructions carefully.   1. Shower the NIGHT BEFORE SURGERY and the MORNING OF SURGERY with CHG.   2. If you chose to wash your hair, wash your hair first as usual with your normal shampoo.  3. After you shampoo, rinse your hair and body thoroughly to remove the shampoo.  4. Use CHG as you would any other liquid soap. You can apply CHG directly to the skin and wash gently with a scrungie or a clean washcloth.   5. Apply the CHG Soap to your body ONLY FROM THE NECK DOWN.  Do not use on open wounds or open sores. Avoid contact with your eyes, ears, mouth and genitals (private parts). Wash Face and genitals (private parts)  with your normal soap.  6. Wash thoroughly, paying special attention to the area where your surgery will be performed.  7. Thoroughly rinse your body with warm water from the neck down.  8. DO NOT shower/wash with your normal soap after using and rinsing off the CHG Soap.  9. Pat yourself dry with a CLEAN TOWEL.  10. Wear CLEAN PAJAMAS to bed the night before surgery, wear comfortable clothes the morning of surgery  11. Place CLEAN SHEETS  on your bed the night of your first shower and DO NOT SLEEP WITH PETS.    Day of Surgery: Do not apply any deodorants/lotions. Please wear clean clothes to the hospital/surgery center.      Please read over the following fact sheets that you were given. Coughing and Deep Breathing, MRSA Information and Surgical Site Infection Prevention

## 2018-02-01 ENCOUNTER — Other Ambulatory Visit: Payer: Self-pay

## 2018-02-01 ENCOUNTER — Encounter (HOSPITAL_COMMUNITY)
Admission: RE | Admit: 2018-02-01 | Discharge: 2018-02-01 | Disposition: A | Discharge status: Home / Self Care | Payer: Self-pay | Source: Ambulatory Visit | Attending: Vascular Surgery | Admitting: Vascular Surgery

## 2018-02-01 ENCOUNTER — Encounter (HOSPITAL_COMMUNITY): Payer: Self-pay

## 2018-02-01 DIAGNOSIS — F1410 Cocaine abuse, uncomplicated: Secondary | ICD-10-CM | POA: Insufficient documentation

## 2018-02-01 DIAGNOSIS — I10 Essential (primary) hypertension: Secondary | ICD-10-CM | POA: Insufficient documentation

## 2018-02-01 DIAGNOSIS — I6523 Occlusion and stenosis of bilateral carotid arteries: Secondary | ICD-10-CM | POA: Insufficient documentation

## 2018-02-01 DIAGNOSIS — I714 Abdominal aortic aneurysm, without rupture: Secondary | ICD-10-CM | POA: Insufficient documentation

## 2018-02-01 DIAGNOSIS — Z7289 Other problems related to lifestyle: Secondary | ICD-10-CM | POA: Insufficient documentation

## 2018-02-01 DIAGNOSIS — Z01812 Encounter for preprocedural laboratory examination: Secondary | ICD-10-CM | POA: Insufficient documentation

## 2018-02-01 DIAGNOSIS — F419 Anxiety disorder, unspecified: Secondary | ICD-10-CM | POA: Insufficient documentation

## 2018-02-01 DIAGNOSIS — I2510 Atherosclerotic heart disease of native coronary artery without angina pectoris: Secondary | ICD-10-CM | POA: Insufficient documentation

## 2018-02-01 DIAGNOSIS — F329 Major depressive disorder, single episode, unspecified: Secondary | ICD-10-CM | POA: Insufficient documentation

## 2018-02-01 HISTORY — DX: Unspecified osteoarthritis, unspecified site: M19.90

## 2018-02-01 LAB — COMPREHENSIVE METABOLIC PANEL WITH GFR
ALT: 21 U/L (ref 14–54)
AST: 22 U/L (ref 15–41)
Albumin: 3.8 g/dL (ref 3.5–5.0)
Alkaline Phosphatase: 97 U/L (ref 38–126)
Anion gap: 7 (ref 5–15)
BUN: 23 mg/dL — ABNORMAL HIGH (ref 6–20)
CO2: 25 mmol/L (ref 22–32)
Calcium: 9.6 mg/dL (ref 8.9–10.3)
Chloride: 109 mmol/L (ref 101–111)
Creatinine, Ser: 0.77 mg/dL (ref 0.44–1.00)
GFR calc Af Amer: >60 mL/min
GFR calc non Af Amer: >60 mL/min
Glucose, Bld: 97 mg/dL (ref 65–99)
Potassium: 4.4 mmol/L (ref 3.5–5.1)
Sodium: 141 mmol/L (ref 135–145)
Total Bilirubin: 0.9 mg/dL (ref 0.3–1.2)
Total Protein: 7.2 g/dL (ref 6.5–8.1)

## 2018-02-01 LAB — CBC
HCT: 44.5 % (ref 36.0–46.0)
Hemoglobin: 14.3 g/dL (ref 12.0–15.0)
MCH: 30.2 pg (ref 26.0–34.0)
MCHC: 32.1 g/dL (ref 30.0–36.0)
MCV: 93.9 fL (ref 78.0–100.0)
Platelets: 280 K/uL (ref 150–400)
RBC: 4.74 MIL/uL (ref 3.87–5.11)
RDW: 13.2 % (ref 11.5–15.5)
WBC: 8.6 K/uL (ref 4.0–10.5)

## 2018-02-01 LAB — PROTIME-INR
INR: 0.98
Prothrombin Time: 12.9 seconds (ref 11.4–15.2)

## 2018-02-01 LAB — URINALYSIS, ROUTINE W REFLEX MICROSCOPIC
Bilirubin Urine: NEGATIVE
Glucose, UA: NEGATIVE mg/dL
HGB URINE DIPSTICK: NEGATIVE
Ketones, ur: NEGATIVE mg/dL
LEUKOCYTES UA: NEGATIVE
Nitrite: NEGATIVE
PROTEIN: NEGATIVE mg/dL
SPECIFIC GRAVITY, URINE: 1.021 (ref 1.005–1.030)
pH: 5 (ref 5.0–8.0)

## 2018-02-01 LAB — TYPE AND SCREEN
ABO/RH(D): O POS
Antibody Screen: NEGATIVE

## 2018-02-01 LAB — RAPID URINE DRUG SCREEN, HOSP PERFORMED
Amphetamines: NOT DETECTED
Barbiturates: NOT DETECTED
Benzodiazepines: NOT DETECTED
Cocaine: NOT DETECTED
Opiates: NOT DETECTED
Tetrahydrocannabinol: POSITIVE — AB

## 2018-02-01 LAB — APTT: aPTT: 35 s (ref 24–36)

## 2018-02-01 LAB — SURGICAL PCR SCREEN
MRSA, PCR: NEGATIVE
Staphylococcus aureus: NEGATIVE

## 2018-02-01 NOTE — Progress Notes (Signed)
PCP: Jacquelin Hawking, PA @ [facility] [place] [phone] Cardiologist: Dr. Ival Bible  Pt. States she's been off crack since 10/2017. Reports smokes marijuana every other day.

## 2018-02-02 ENCOUNTER — Other Ambulatory Visit (HOSPITAL_COMMUNITY): Payer: Self-pay

## 2018-02-02 NOTE — Progress Notes (Signed)
Anesthesia Chart Review:  Case:  [id number] Date/Time:  02/08/18 0715   Procedures:      ENDARTERECTOMY CAROTID REDO (Right )     POSSIBLE RIGHT THIGH VEIN HARVEST (Right )   Anesthesia type:  General   Pre-op diagnosis:  right carotid restenosis   Location[facility]  [facility] ROOM 12 / [facility] OR   Surgeon:  Chuck Hint, MD      DISCUSSION: Patient is a 57 year old female scheduled for the above procedure. She has known LICA occlusion and recurrent RICA stenosis. She was not felt to be a good candidate for carotid stenting due to her anatomy. She was initially scheduled for this surgery on 04/29/17, but procedure was cancelled due to positive UDS for cocaine on 04/27/17 and poorly controlled HTN. UDS on 02/01/18 was positive of THC, but negative for cocaine. (She denied cocaine use since 10/2017.)    History includes Bipolar disorder, anxiety, depression, CVA, AAA (4.1 cm 09/2017 U/S), headaches, polysubstance abuse (tobacco, cocaine, THC), carotid artery disease s/p right carotid endarterectomy 07/22/05 with recurrent stenosis 01/2017, occluded left ICA), CAD (occluded RCA known since 07/16/05, 70% LAD, 60% DIAG1, 40-50% LCX 07/2016, medical therapy prior to LAD PCI attempts recommended given history of non-compliance and polysubstance abuse).  Patient's PAT BP was acceptable at 140/77. UDS was negative for cocaine. She had preoperative cardiology input by Dr. Diona Browner from 06/2017 (see below). If no acute changes then I anticipate that she can proceed as planned.      VS: BP 140/77   Pulse (!) 54   Temp 36.7 C   Resp 18   Ht  (1.6 m)   Wt 170 lb 6.4 oz (77.3 kg)   LMP 07/18/2011   SpO2 100%   BMI 30.19 kg/m   PROVIDERS: Jacquelin Hawking, PA-C with [facility] [phone]). Last visit 12/07/17 for HTN follow-up with medications adjusted. Cardiologist is Dr. Nona Dell, last visit 07/28/17 for preoperative evaluation. He wrote, "She is clinically stable without angina at this time on  medical therapy and at least on today's visit her blood pressure is better controlled.  I underscored the critical importance of medication compliance and abstaining from further substance abuse.  Plan to inform Dr. Adele Dan office regarding scheduling of her endarterectomy.  Patient is at high risk for adverse cardiac events, but this risk does not preclude proceeding with surgery."   LABS: Labs reviewed: Acceptable for surgery. (all labs ordered are listed, but only abnormal results are displayed)  Labs Reviewed  RAPID URINE DRUG SCREEN, HOSP PERFORMED - Abnormal; Notable for the following components:      Result Value   Tetrahydrocannabinol POSITIVE (*)    All other components within normal limits  COMPREHENSIVE METABOLIC PANEL - Abnormal; Notable for the following components:   BUN 23 (*)    All other components within normal limits  SURGICAL PCR SCREEN  APTT  CBC  PROTIME-INR  URINALYSIS, ROUTINE W REFLEX MICROSCOPIC  TYPE AND SCREEN    IMAGES: CT head/CTA head/neck 04/07/17: IMPRESSION: CT head: 1. No acute intracranial abnormality. 2. Progression of moderate chronic microvascular ischemic changes and mild parenchymal volume loss of the brain. Chronic infarcts in left frontal lobe, basal ganglia, and right corona radiata. CTA neck: 1. 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 2. Right carotid bifurcation mixed plaque with prominent fibrofatty component and mild less than 50% proximal ICA stenosis. 3. Right proximal internal carotid artery focal dissection just downstream to bifurcation with  60-70% stenosis. 4. Right vertebral origin moderate 60% stenosis. 5. Occlusion of left common and internal carotid arteries in the neck. 6. Occlusion of left vertebral artery origin to the C4 level where it is reconstituted via collaterals. CTA head: 1. Occlusion of left internal carotid artery to the ophthalmic segment. Left internal carotid artery from  the ophthalmic segment terminus is diminutive in caliber and irregular. 2. Otherwise no large vessel occlusion, aneurysm, high-grade stenosis, or vascular malformation of circle of Willis identified.   EKG: 07/28/17: SB at 51 bpm, old inferior infarct, poor r wave progression   CV: Carotid U/S 01/26/18: Impression: Right Carotid: Velocities in the right ICA are consistent with a 80-99%        stenosis. No significant changes noted when compared to previous        exam of 02/10/2017. Vertebrals: Right vertebral artery demonstrates antegrade flow. Subclavians: Normal flow hemodynamics were seen in the right subclavian artery. (Patient has known left ICA occlusion.)  Abdominal aorta U/S 10/13/17: IMPRESSION: 1. Stable distal abdominal aortic aneurysm of 4.1 cm in maximum diameter. 2. Left upper pole renal cyst of 2.8 cm. 3. No gallstones. 4. Portions of the pancreas are obscured by bowel gas.  Echo 08/08/16: Study Conclusions - Left ventricle: The cavity size was normal. There was mild concentric hypertrophy. Systolic function was normal. The estimated ejection fraction was in the range of 55% to 60%. Wall motion was normal; there were no regional wall motion abnormalities. Doppler parameters are consistent with abnormal left ventricular relaxation (grade 1 diastolic dysfunction). - Left atrium: The atrium was mildly dilated. - Atrial septum: No defect or patent foramen ovale was identified.  Cardiac cath 08/07/16:  There is mild left ventricular systolic dysfunction.  The left ventricular ejection fraction is 45-50% by visual estimate.  There is no mitral valve regurgitation.  LV end diastolic pressure is mildly elevated.  Mid RCA lesion, 100 %stenosed.  Dist RCA lesion, 75 %stenosed.  Mid LAD-2 lesion, 60 %stenosed.  Mid LAD-1 lesion, 70 %stenosed.  Dist LAD-2 lesion, 45 %stenosed.  Dist LAD-1 lesion, 50 %stenosed.  2nd Mrg lesion, 40  %stenosed. CONCLUSION: 1. Moderate LV dysfunction with upper mid to basal inferior hypocontractility. Global ejection fraction is 45-50%. 2. Multivessel CAD with calcification of the proximal LAD with 70% followed by 60% stenoses between the first and second diagonal vessel with segmental 50% stenoses in the mid distal LAD; 40-50% diffuse narrowing in the distal circumflex marginal branch; an old total occlusion of the proximal RCA with bridging collaterals. RECOMMENDATION: With the patient's long-standing history of tobacco use, polysubstance abuse, without taking medications and potential for noncompliance, recommend an initial attempt at aggressive medical management prior to any attempt at intervention to the LAD system. With her hypertensive history will initiate amlodipine, both for blood pressure and potential ischemic and vasospastic benefit, continue carvedilol, high potency statin therapy and possibly add nitrates and ACE/ARB if needed. Will initiate aspirin and generic clopidogrel.    Past Medical History:  Diagnosis Date  . AAA (abdominal aortic aneurysm) without rupture (HCC) 08/25/2016  . Anxiety   . Arthritis    knees  . Bipolar 1 disorder (HCC)   . CAD (coronary artery disease)    a. 08/08/16 LAD 70%, 60% 1st diag, CTO RCA Med Rx EF 55%  . Carotid artery stenosis    Right carotid endarterectomy 07/22/05 with recurrent stenosis 01/2017; known left ICA occlusion  . Crack cocaine use   . Depression   . Dyspnea    "  coughing spells"  . Headache    ocassionally  . Hypertension   . Myocardial infarction (HCC)   . Right Groin Hematoma    a. 07/2016 following diagnostic cath  . Stroke [facility]) 2017   "light stroke"   . Tobacco abuse     Past Surgical History:  Procedure Laterality Date  . CARDIAC CATHETERIZATION N/A 08/07/2016   Procedure: Left Heart Cath and Coronary Angiography;  Surgeon: Lennette Bihari, MD;  Location: [facility] INVASIVE CV LAB;  Service: Cardiovascular;   Laterality: N/A;  . CYST REMOVAL TRUNK Left    beneath L breast  . TUBAL LIGATION    . VASCULAR SURGERY Right    right carotid endarterectomy 07/22/05     MEDICATIONS: . acetaminophen (TYLENOL) 325 MG tablet  . amLODipine (NORVASC) 10 MG tablet  . aspirin EC 325 MG tablet  . Aspirin-Salicylamide-Caffeine (BC FAST PAIN RELIEF) 650-195-33.3 MG PACK  . atorvastatin (LIPITOR) 80 MG tablet  . cloNIDine (CATAPRES) 0.2 MG tablet  . lisinopril (PRINIVIL,ZESTRIL) 40 MG tablet  . metoprolol tartrate (LOPRESSOR) 100 MG tablet  . NITROSTAT 0.4 MG SL tablet  . PROVENTIL HFA 108 (90 Base) MCG/ACT inhaler   No current facility-administered medications for this encounter.     Velna Ochs [facility] Short Stay Center/Anesthesiology Phone [phone] 02/02/2018 3:46 PM

## 2018-02-07 NOTE — Anesthesia Preprocedure Evaluation (Addendum)
Anesthesia Evaluation  Patient identified by MRN, date of birth, ID band Patient awake    Reviewed: Allergy & Precautions, NPO status , Patient's Chart, lab work & pertinent test results, reviewed documented beta blocker date and time   Airway Mallampati: III  TM Distance: >3 FB Neck ROM: Full    Dental  (+) Poor Dentition, Missing, Edentulous Upper,    Pulmonary Current Smoker,    Pulmonary exam normal breath sounds clear to auscultation       Cardiovascular hypertension, Pt. on medications and Pt. on home beta blockers + CAD and + Past MI  Normal cardiovascular exam Rhythm:Regular Rate:Normal  ECG: SB, rate 51  ECHO (2017) LV EF: 55% -   60%  CATH Moderate LV dysfunction with upper mid to basal inferior hypocontractility.  Global ejection fraction is 45-50%.  Multivessel  CAD with calcification of the proximal LAD with 70% followed by 60% stenoses between the first and second diagonal vessel with segmental 50% stenoses in the mid distal LAD; 40-50% diffuse narrowing in the distal circumflex marginal branch; an old total occlusion of the proximal RCA with bridging collaterals.   Sees cardiologist Diona Browner)   Neuro/Psych  Headaches, PSYCHIATRIC DISORDERS Anxiety Depression Bipolar Disorder CVA, No Residual Symptoms    GI/Hepatic negative GI ROS, Neg liver ROS,   Endo/Other  negative endocrine ROS  Renal/GU negative Renal ROS     Musculoskeletal negative musculoskeletal ROS (+)   Abdominal (+) + obese,   Peds  Hematology HLD   Anesthesia Other Findings right carotid restenosis Velocities in the right ICA are consistent with a 80-99% stenosis.    Reproductive/Obstetrics                            Anesthesia Physical Anesthesia Plan  ASA: III  Anesthesia Plan: General   Post-op Pain Management:    Induction: Intravenous  PONV Risk Score and Plan: 2 and Ondansetron,  Dexamethasone and Treatment may vary due to age or medical condition  Airway Management Planned: Oral ETT  Additional Equipment: Arterial line  Intra-op Plan:   Post-operative Plan: Extubation in OR  Informed Consent: I have reviewed the patients History and Physical, chart, labs and discussed the procedure including the risks, benefits and alternatives for the proposed anesthesia with the patient or authorized representative who has indicated his/her understanding and acceptance.   Dental advisory given  Plan Discussed with: CRNA  Anesthesia Plan Comments:         Anesthesia Quick Evaluation

## 2018-02-08 ENCOUNTER — Inpatient Hospital Stay (HOSPITAL_COMMUNITY)
Admission: RE | Admit: 2018-02-08 | Discharge: 2018-02-09 | DRG: 039 | Disposition: A | Discharge status: Home / Self Care | Payer: Self-pay | Source: Ambulatory Visit | Attending: Vascular Surgery | Admitting: Vascular Surgery

## 2018-02-08 ENCOUNTER — Other Ambulatory Visit: Payer: Self-pay

## 2018-02-08 ENCOUNTER — Encounter (HOSPITAL_COMMUNITY): Payer: Self-pay

## 2018-02-08 ENCOUNTER — Inpatient Hospital Stay (HOSPITAL_COMMUNITY): Payer: Self-pay | Admitting: Vascular Surgery

## 2018-02-08 ENCOUNTER — Ambulatory Visit: Payer: Self-pay | Admitting: Physician Assistant

## 2018-02-08 ENCOUNTER — Encounter (HOSPITAL_COMMUNITY)
Admission: RE | Disposition: A | Discharge status: Home / Self Care | Payer: Self-pay | Source: Ambulatory Visit | Attending: Vascular Surgery

## 2018-02-08 ENCOUNTER — Inpatient Hospital Stay (HOSPITAL_COMMUNITY): Payer: Self-pay | Admitting: Certified Registered Nurse Anesthetist

## 2018-02-08 DIAGNOSIS — Z79899 Other long term (current) drug therapy: Secondary | ICD-10-CM

## 2018-02-08 DIAGNOSIS — E669 Obesity, unspecified: Secondary | ICD-10-CM | POA: Diagnosis present

## 2018-02-08 DIAGNOSIS — Z880 Allergy status to penicillin: Secondary | ICD-10-CM

## 2018-02-08 DIAGNOSIS — M170 Bilateral primary osteoarthritis of knee: Secondary | ICD-10-CM | POA: Diagnosis present

## 2018-02-08 DIAGNOSIS — I714 Abdominal aortic aneurysm, without rupture: Secondary | ICD-10-CM | POA: Diagnosis present

## 2018-02-08 DIAGNOSIS — I10 Essential (primary) hypertension: Secondary | ICD-10-CM | POA: Diagnosis present

## 2018-02-08 DIAGNOSIS — F17210 Nicotine dependence, cigarettes, uncomplicated: Secondary | ICD-10-CM | POA: Diagnosis present

## 2018-02-08 DIAGNOSIS — I6529 Occlusion and stenosis of unspecified carotid artery: Secondary | ICD-10-CM | POA: Diagnosis present

## 2018-02-08 DIAGNOSIS — Z8673 Personal history of transient ischemic attack (TIA), and cerebral infarction without residual deficits: Secondary | ICD-10-CM

## 2018-02-08 DIAGNOSIS — Z833 Family history of diabetes mellitus: Secondary | ICD-10-CM

## 2018-02-08 DIAGNOSIS — I6521 Occlusion and stenosis of right carotid artery: Principal | ICD-10-CM | POA: Diagnosis present

## 2018-02-08 DIAGNOSIS — Z8249 Family history of ischemic heart disease and other diseases of the circulatory system: Secondary | ICD-10-CM

## 2018-02-08 DIAGNOSIS — I2510 Atherosclerotic heart disease of native coronary artery without angina pectoris: Secondary | ICD-10-CM | POA: Diagnosis present

## 2018-02-08 DIAGNOSIS — F319 Bipolar disorder, unspecified: Secondary | ICD-10-CM | POA: Diagnosis present

## 2018-02-08 DIAGNOSIS — F419 Anxiety disorder, unspecified: Secondary | ICD-10-CM | POA: Diagnosis present

## 2018-02-08 DIAGNOSIS — Z823 Family history of stroke: Secondary | ICD-10-CM

## 2018-02-08 DIAGNOSIS — I252 Old myocardial infarction: Secondary | ICD-10-CM

## 2018-02-08 DIAGNOSIS — Z7982 Long term (current) use of aspirin: Secondary | ICD-10-CM

## 2018-02-08 DIAGNOSIS — Z825 Family history of asthma and other chronic lower respiratory diseases: Secondary | ICD-10-CM

## 2018-02-08 DIAGNOSIS — Z6830 Body mass index (BMI) 30.0-30.9, adult: Secondary | ICD-10-CM

## 2018-02-08 DIAGNOSIS — E785 Hyperlipidemia, unspecified: Secondary | ICD-10-CM | POA: Diagnosis present

## 2018-02-08 HISTORY — PX: ENDARTERECTOMY: SHX5162

## 2018-02-08 LAB — CREATININE, SERUM
Creatinine, Ser: 0.73 mg/dL (ref 0.44–1.00)
GFR calc Af Amer: >60 mL/min (ref >60)
GFR calc non Af Amer: >60 mL/min (ref >60)

## 2018-02-08 LAB — CBC
HEMATOCRIT: 42.6 % (ref 36.0–46.0)
HEMOGLOBIN: 14 g/dL (ref 12.0–15.0)
MCH: 29.7 pg (ref 26.0–34.0)
MCHC: 32.9 g/dL (ref 30.0–36.0)
MCV: 90.4 fL (ref 78.0–100.0)
Platelets: 351 10*3/uL (ref 150–400)
RBC: 4.71 MIL/uL (ref 3.87–5.11)
RDW: 12.4 % (ref 11.5–15.5)
WBC: 13.3 10*3/uL — ABNORMAL HIGH (ref 4.0–10.5)

## 2018-02-08 SURGERY — ENDARTERECTOMY, CAROTID
Anesthesia: General | Site: Neck | Laterality: Right

## 2018-02-08 MED ORDER — VANCOMYCIN HCL IN DEXTROSE 1-5 GM/200ML-% IV SOLN
1000.0000 mg | Freq: Once | INTRAVENOUS | Status: AC
  Administered 2018-02-09: 1000 mg via INTRAVENOUS
  Filled 2018-02-08: qty 200

## 2018-02-08 MED ORDER — AMLODIPINE BESYLATE 10 MG PO TABS: 10.0000 mg | ORAL_TABLET | Freq: Every day | ORAL | Status: DC

## 2018-02-08 MED ORDER — FENTANYL CITRATE (PF) 100 MCG/2ML IJ SOLN
INTRAMUSCULAR | Status: AC
  Administered 2018-02-08: 50 ug via INTRAVENOUS
  Filled 2018-02-08: qty 2

## 2018-02-08 MED ORDER — PROPOFOL 10 MG/ML IV BOLUS
INTRAVENOUS | Status: DC | PRN
  Administered 2018-02-08: 50 mg via INTRAVENOUS
  Administered 2018-02-08: 120 mg via INTRAVENOUS

## 2018-02-08 MED ORDER — THROMBIN 20000 UNITS EX SOLR
CUTANEOUS | Status: AC
  Filled 2018-02-08: qty 20000

## 2018-02-08 MED ORDER — PROTAMINE SULFATE 10 MG/ML IV SOLN
INTRAVENOUS | Status: DC | PRN
  Administered 2018-02-08: 40 mg via INTRAVENOUS

## 2018-02-08 MED ORDER — PANTOPRAZOLE SODIUM 40 MG PO TBEC
40.0000 mg | DELAYED_RELEASE_TABLET | Freq: Every day | ORAL | Status: DC
  Administered 2018-02-08 – 2018-02-09 (×2): 40 mg via ORAL
  Filled 2018-02-08 (×2): qty 1

## 2018-02-08 MED ORDER — ONDANSETRON HCL 4 MG/2ML IJ SOLN
INTRAMUSCULAR | Status: DC | PRN
  Administered 2018-02-08: 4 mg via INTRAVENOUS

## 2018-02-08 MED ORDER — SODIUM CHLORIDE 0.9 % IV SOLN
INTRAVENOUS | Status: AC
  Filled 2018-02-08: qty 1.2

## 2018-02-08 MED ORDER — LACTATED RINGERS IV SOLN
INTRAVENOUS | Status: DC | PRN
  Administered 2018-02-08 (×2): via INTRAVENOUS

## 2018-02-08 MED ORDER — ACETAMINOPHEN 325 MG PO TABS
650.0000 mg | ORAL_TABLET | Freq: Three times a day (TID) | ORAL | Status: DC | PRN
  Administered 2018-02-09: 650 mg via ORAL
  Filled 2018-02-08: qty 2

## 2018-02-08 MED ORDER — 0.9 % SODIUM CHLORIDE (POUR BTL) OPTIME
TOPICAL | Status: DC | PRN
  Administered 2018-02-08: 2000 mL

## 2018-02-08 MED ORDER — HYDROMORPHONE HCL 2 MG/ML IJ SOLN
INTRAMUSCULAR | Status: AC
  Administered 2018-02-08: 1 mg
  Filled 2018-02-08: qty 1

## 2018-02-08 MED ORDER — DOCUSATE SODIUM 100 MG PO CAPS
100.0000 mg | ORAL_CAPSULE | Freq: Every day | ORAL | Status: DC
  Administered 2018-02-09: 100 mg via ORAL
  Filled 2018-02-08: qty 1

## 2018-02-08 MED ORDER — DEXAMETHASONE SODIUM PHOSPHATE 10 MG/ML IJ SOLN
INTRAMUSCULAR | Status: AC
  Filled 2018-02-08: qty 1

## 2018-02-08 MED ORDER — SODIUM CHLORIDE 0.9 % IV SOLN
INTRAVENOUS | Status: DC
  Administered 2018-02-08 (×2): via INTRAVENOUS

## 2018-02-08 MED ORDER — POTASSIUM CHLORIDE CRYS ER 20 MEQ PO TBCR: 20.0000 meq | EXTENDED_RELEASE_TABLET | Freq: Every day | ORAL | Status: DC | PRN

## 2018-02-08 MED ORDER — ONDANSETRON HCL 4 MG/2ML IJ SOLN: 4.0000 mg | Freq: Four times a day (QID) | INTRAMUSCULAR | Status: DC | PRN

## 2018-02-08 MED ORDER — DEXTRAN 40 IN D5W 10 % IV SOLN
INTRAVENOUS | Status: AC
  Filled 2018-02-08: qty 500

## 2018-02-08 MED ORDER — BISACODYL 5 MG PO TBEC: 5.0000 mg | DELAYED_RELEASE_TABLET | Freq: Every day | ORAL | Status: DC | PRN

## 2018-02-08 MED ORDER — ROCURONIUM BROMIDE 100 MG/10ML IV SOLN
INTRAVENOUS | Status: DC | PRN
  Administered 2018-02-08: 50 mg via INTRAVENOUS
  Administered 2018-02-08: 10 mg via INTRAVENOUS

## 2018-02-08 MED ORDER — DEXTRAN 40 IN SALINE 10-0.9 % IV SOLN
INTRAVENOUS | Status: AC | PRN
  Administered 2018-02-08: 500 mL

## 2018-02-08 MED ORDER — EPHEDRINE SULFATE 50 MG/ML IJ SOLN
INTRAMUSCULAR | Status: AC
  Filled 2018-02-08: qty 1

## 2018-02-08 MED ORDER — ASPIRIN EC 325 MG PO TBEC
325.0000 mg | DELAYED_RELEASE_TABLET | Freq: Every day | ORAL | Status: DC
  Administered 2018-02-08 – 2018-02-09 (×2): 325 mg via ORAL
  Filled 2018-02-08 (×2): qty 1

## 2018-02-08 MED ORDER — SUGAMMADEX SODIUM 200 MG/2ML IV SOLN
INTRAVENOUS | Status: AC
  Filled 2018-02-08: qty 2

## 2018-02-08 MED ORDER — ONDANSETRON HCL 4 MG/2ML IJ SOLN
4.0000 mg | Freq: Once | INTRAMUSCULAR | Status: DC | PRN
Start: 2018-02-08 — End: 2018-02-08

## 2018-02-08 MED ORDER — SENNOSIDES-DOCUSATE SODIUM 8.6-50 MG PO TABS: 1.0000 | ORAL_TABLET | Freq: Every evening | ORAL | Status: DC | PRN

## 2018-02-08 MED ORDER — GUAIFENESIN-DM 100-10 MG/5ML PO SYRP
15.0000 mL | ORAL_SOLUTION | ORAL | Status: DC | PRN
  Administered 2018-02-08: 15 mL via ORAL
  Filled 2018-02-08: qty 15

## 2018-02-08 MED ORDER — NICARDIPINE HCL IN NACL 20-0.86 MG/200ML-% IV SOLN
3.0000 mg/h | INTRAVENOUS | Status: DC
  Filled 2018-02-08: qty 200

## 2018-02-08 MED ORDER — LISINOPRIL 40 MG PO TABS
40.0000 mg | ORAL_TABLET | Freq: Every day | ORAL | Status: DC
  Administered 2018-02-08 – 2018-02-09 (×2): 40 mg via ORAL
  Filled 2018-02-08 (×2): qty 1

## 2018-02-08 MED ORDER — OXYCODONE HCL 5 MG PO TABS
5.0000 mg | ORAL_TABLET | ORAL | Status: DC | PRN
  Administered 2018-02-08 – 2018-02-09 (×5): 10 mg via ORAL
  Filled 2018-02-08 (×4): qty 2

## 2018-02-08 MED ORDER — PHENOL 1.4 % MT LIQD: 1.0000 | OROMUCOSAL | Status: DC | PRN

## 2018-02-08 MED ORDER — MIDAZOLAM HCL 5 MG/5ML IJ SOLN
INTRAMUSCULAR | Status: DC | PRN
  Administered 2018-02-08: 2 mg via INTRAVENOUS

## 2018-02-08 MED ORDER — LIDOCAINE 2% (20 MG/ML) 5 ML SYRINGE
INTRAMUSCULAR | Status: DC | PRN
  Administered 2018-02-08: 50 mg via INTRAVENOUS

## 2018-02-08 MED ORDER — LIDOCAINE-EPINEPHRINE (PF) 1 %-1:200000 IJ SOLN
INTRAMUSCULAR | Status: DC | PRN
  Administered 2018-02-08: 10 mL

## 2018-02-08 MED ORDER — ENOXAPARIN SODIUM 40 MG/0.4ML ~~LOC~~ SOLN: 40.0000 mg | SUBCUTANEOUS | Status: DC

## 2018-02-08 MED ORDER — SODIUM CHLORIDE 0.9 % IV SOLN: 500.0000 mL | Freq: Once | INTRAVENOUS | Status: DC | PRN

## 2018-02-08 MED ORDER — ONDANSETRON HCL 4 MG/2ML IJ SOLN
INTRAMUSCULAR | Status: AC
  Filled 2018-02-08: qty 2

## 2018-02-08 MED ORDER — CLONIDINE HCL 0.2 MG PO TABS: 0.2000 mg | ORAL_TABLET | Freq: Two times a day (BID) | ORAL | Status: DC

## 2018-02-08 MED ORDER — LIDOCAINE-EPINEPHRINE (PF) 1 %-1:200000 IJ SOLN
INTRAMUSCULAR | Status: AC
  Filled 2018-02-08: qty 30

## 2018-02-08 MED ORDER — ROCURONIUM BROMIDE 50 MG/5ML IV SOLN
INTRAVENOUS | Status: AC
  Filled 2018-02-08: qty 2

## 2018-02-08 MED ORDER — HYDRALAZINE HCL 20 MG/ML IJ SOLN
5.0000 mg | INTRAMUSCULAR | Status: DC | PRN
  Filled 2018-02-08: qty 1

## 2018-02-08 MED ORDER — OXYCODONE HCL 5 MG PO TABS
ORAL_TABLET | ORAL | Status: AC
  Filled 2018-02-08: qty 2

## 2018-02-08 MED ORDER — MIDAZOLAM HCL 2 MG/2ML IJ SOLN
INTRAMUSCULAR | Status: AC
  Filled 2018-02-08: qty 2

## 2018-02-08 MED ORDER — MAGNESIUM SULFATE 2 GM/50ML IV SOLN: 2.0000 g | Freq: Every day | INTRAVENOUS | Status: DC | PRN

## 2018-02-08 MED ORDER — SODIUM CHLORIDE 0.9 % IV SOLN
0.0125 ug/kg/min | INTRAVENOUS | Status: AC
  Administered 2018-02-08: .05 ug/kg/min via INTRAVENOUS
  Filled 2018-02-08: qty 2000

## 2018-02-08 MED ORDER — EPHEDRINE SULFATE 50 MG/ML IJ SOLN
INTRAMUSCULAR | Status: DC | PRN
  Administered 2018-02-08 (×2): 10 mg via INTRAVENOUS

## 2018-02-08 MED ORDER — METOPROLOL TARTRATE 100 MG PO TABS
200.0000 mg | ORAL_TABLET | Freq: Two times a day (BID) | ORAL | Status: DC
  Administered 2018-02-08 – 2018-02-09 (×2): 200 mg via ORAL
  Filled 2018-02-08 (×2): qty 2

## 2018-02-08 MED ORDER — FENTANYL CITRATE (PF) 100 MCG/2ML IJ SOLN
INTRAMUSCULAR | Status: DC | PRN
  Administered 2018-02-08: 50 ug via INTRAVENOUS
  Administered 2018-02-08: 100 ug via INTRAVENOUS

## 2018-02-08 MED ORDER — HEPARIN SODIUM (PORCINE) 1000 UNIT/ML IJ SOLN
INTRAMUSCULAR | Status: DC | PRN
  Administered 2018-02-08: 7500 [IU] via INTRAVENOUS

## 2018-02-08 MED ORDER — LIDOCAINE 2% (20 MG/ML) 5 ML SYRINGE
INTRAMUSCULAR | Status: AC
  Filled 2018-02-08: qty 5

## 2018-02-08 MED ORDER — CHLORHEXIDINE GLUCONATE CLOTH 2 % EX PADS: 6.0000 | MEDICATED_PAD | Freq: Once | CUTANEOUS | Status: DC

## 2018-02-08 MED ORDER — ATORVASTATIN CALCIUM 80 MG PO TABS
80.0000 mg | ORAL_TABLET | Freq: Every day | ORAL | Status: DC
  Administered 2018-02-08: 80 mg via ORAL
  Filled 2018-02-08: qty 1

## 2018-02-08 MED ORDER — ALUM & MAG HYDROXIDE-SIMETH 200-200-20 MG/5ML PO SUSP: 15.0000 mL | ORAL | Status: DC | PRN

## 2018-02-08 MED ORDER — FENTANYL CITRATE (PF) 100 MCG/2ML IJ SOLN
25.0000 ug | INTRAMUSCULAR | Status: DC | PRN
  Administered 2018-02-08 (×2): 50 ug via INTRAVENOUS

## 2018-02-08 MED ORDER — HYDROMORPHONE HCL 1 MG/ML IJ SOLN
0.5000 mg | INTRAMUSCULAR | Status: DC | PRN
  Administered 2018-02-08 – 2018-02-09 (×4): 1 mg via INTRAVENOUS
  Filled 2018-02-08 (×4): qty 1

## 2018-02-08 MED ORDER — CLONIDINE HCL 0.2 MG PO TABS
0.2000 mg | ORAL_TABLET | Freq: Two times a day (BID) | ORAL | Status: DC
  Administered 2018-02-08 – 2018-02-09 (×2): 0.2 mg via ORAL
  Filled 2018-02-08 (×2): qty 1

## 2018-02-08 MED ORDER — FENTANYL CITRATE (PF) 250 MCG/5ML IJ SOLN
INTRAMUSCULAR | Status: AC
  Filled 2018-02-08: qty 5

## 2018-02-08 MED ORDER — ALBUTEROL SULFATE (2.5 MG/3ML) 0.083% IN NEBU: 2.5000 mg | INHALATION_SOLUTION | Freq: Four times a day (QID) | RESPIRATORY_TRACT | Status: DC | PRN

## 2018-02-08 MED ORDER — VANCOMYCIN HCL IN DEXTROSE 1-5 GM/200ML-% IV SOLN
1000.0000 mg | INTRAVENOUS | Status: AC
  Administered 2018-02-08: 1000 mg via INTRAVENOUS
  Filled 2018-02-08: qty 200

## 2018-02-08 MED ORDER — SUGAMMADEX SODIUM 200 MG/2ML IV SOLN
INTRAVENOUS | Status: DC | PRN
  Administered 2018-02-08: 200 mg via INTRAVENOUS

## 2018-02-08 MED ORDER — AMLODIPINE BESYLATE 10 MG PO TABS
10.0000 mg | ORAL_TABLET | Freq: Every day | ORAL | Status: DC
  Administered 2018-02-09: 10 mg via ORAL
  Filled 2018-02-08: qty 1

## 2018-02-08 MED ORDER — HEPARIN SODIUM (PORCINE) 5000 UNIT/ML IJ SOLN
INTRAMUSCULAR | Status: DC | PRN
  Administered 2018-02-08: 08:00:00

## 2018-02-08 MED ORDER — PHENYLEPHRINE HCL 10 MG/ML IJ SOLN
INTRAMUSCULAR | Status: DC | PRN
  Administered 2018-02-08: 50 ug/min via INTRAVENOUS

## 2018-02-08 MED ORDER — NITROGLYCERIN 0.3 MG SL SUBL: 0.3000 mg | SUBLINGUAL_TABLET | SUBLINGUAL | Status: DC | PRN

## 2018-02-08 MED ORDER — METOPROLOL TARTRATE 5 MG/5ML IV SOLN: 2.0000 mg | INTRAVENOUS | Status: DC | PRN

## 2018-02-08 MED ORDER — CEFAZOLIN SODIUM-DEXTROSE 2-4 GM/100ML-% IV SOLN: 2.0000 g | Freq: Three times a day (TID) | INTRAVENOUS | Status: DC

## 2018-02-08 MED ORDER — LABETALOL HCL 5 MG/ML IV SOLN
10.0000 mg | INTRAVENOUS | Status: DC | PRN
  Administered 2018-02-08: 10 mg via INTRAVENOUS
  Filled 2018-02-08: qty 4

## 2018-02-08 SURGICAL SUPPLY — 57 items
ADH SKN CLS APL DERMABOND .7 (GAUZE/BANDAGES/DRESSINGS) ×2
BAG DECANTER FOR FLEXI CONT (MISCELLANEOUS) ×4 IMPLANT
CANISTER SUCT 3000ML PPV (MISCELLANEOUS) ×4 IMPLANT
CANNULA VESSEL 3MM 2 BLNT TIP (CANNULA) ×12 IMPLANT
CATH ROBINSON RED A/P 18FR (CATHETERS) ×4 IMPLANT
CLIP VESOCCLUDE MED 24/CT (CLIP) ×4 IMPLANT
CLIP VESOCCLUDE SM WIDE 24/CT (CLIP) ×4 IMPLANT
CRADLE DONUT ADULT HEAD (MISCELLANEOUS) ×4 IMPLANT
DERMABOND ADVANCED (GAUZE/BANDAGES/DRESSINGS) ×2
DERMABOND ADVANCED .7 DNX12 (GAUZE/BANDAGES/DRESSINGS) ×2 IMPLANT
DRAIN CHANNEL 15F RND FF W/TCR (WOUND CARE) IMPLANT
DRAPE INCISE IOBAN 66X45 STRL (DRAPES) ×3 IMPLANT
DRAPE ORTHO SPLIT 77X108 STRL (DRAPES) ×4
DRAPE SURG ORHT 6 SPLT 77X108 (DRAPES) ×1 IMPLANT
ELECT REM PT RETURN 9FT ADLT (ELECTROSURGICAL) ×4
ELECTRODE REM PT RTRN 9FT ADLT (ELECTROSURGICAL) ×2 IMPLANT
EVACUATOR SILICONE 100CC (DRAIN) IMPLANT
GEL ULTRASOUND 20GR AQUASONIC (MISCELLANEOUS) IMPLANT
GLOVE BIO SURGEON STRL SZ7 (GLOVE) ×6 IMPLANT
GLOVE BIO SURGEON STRL SZ7.5 (GLOVE) ×4 IMPLANT
GLOVE BIOGEL PI IND STRL 6.5 (GLOVE) ×2 IMPLANT
GLOVE BIOGEL PI IND STRL 7.0 (GLOVE) ×3 IMPLANT
GLOVE BIOGEL PI IND STRL 7.5 (GLOVE) ×1 IMPLANT
GLOVE BIOGEL PI IND STRL 8 (GLOVE) ×2 IMPLANT
GLOVE BIOGEL PI INDICATOR 6.5 (GLOVE) ×4
GLOVE BIOGEL PI INDICATOR 7.0 (GLOVE) ×6
GLOVE BIOGEL PI INDICATOR 7.5 (GLOVE) ×2
GLOVE BIOGEL PI INDICATOR 8 (GLOVE) ×2
GLOVE ECLIPSE 7.0 STRL STRAW (GLOVE) ×3 IMPLANT
GLOVE SURG SS PI 6.5 STRL IVOR (GLOVE) ×6 IMPLANT
GOWN STRL NON-REIN LRG LVL3 (GOWN DISPOSABLE) ×3 IMPLANT
GOWN STRL REUS W/ TWL LRG LVL3 (GOWN DISPOSABLE) ×6 IMPLANT
GOWN STRL REUS W/TWL LRG LVL3 (GOWN DISPOSABLE) ×12
KIT BASIN OR (CUSTOM PROCEDURE TRAY) ×4 IMPLANT
KIT SHUNT ARGYLE CAROTID ART 6 (VASCULAR PRODUCTS) IMPLANT
KIT TURNOVER KIT B (KITS) ×4 IMPLANT
NDL HYPO 25GX1X1/2 BEV (NEEDLE) ×1 IMPLANT
NEEDLE HYPO 25GX1X1/2 BEV (NEEDLE) ×4 IMPLANT
NS IRRIG 1000ML POUR BTL (IV SOLUTION) ×8 IMPLANT
PACK CAROTID (CUSTOM PROCEDURE TRAY) ×4 IMPLANT
PAD ARMBOARD 7.5X6 YLW CONV (MISCELLANEOUS) ×8 IMPLANT
PATCH VASC XENOSURE 1CMX6CM (Vascular Products) ×4 IMPLANT
PATCH VASC XENOSURE 1X6 (Vascular Products) ×1 IMPLANT
SHUNT CAROTID BYPASS 10 (VASCULAR PRODUCTS) ×3 IMPLANT
SHUNT CAROTID BYPASS 12 (VASCULAR PRODUCTS) IMPLANT
SPONGE SURGIFOAM ABS GEL 100 (HEMOSTASIS) IMPLANT
SUT PROLENE 6 0 BV (SUTURE) ×13 IMPLANT
SUT PROLENE 6 0 CC (SUTURE) ×9 IMPLANT
SUT SILK 2 0 PERMA HAND 18 BK (SUTURE) IMPLANT
SUT VIC AB 3-0 SH 27 (SUTURE) ×4
SUT VIC AB 3-0 SH 27X BRD (SUTURE) ×2 IMPLANT
SUT VICRYL 4-0 PS2 18IN ABS (SUTURE) ×4 IMPLANT
SYR 20CC LL (SYRINGE) ×4 IMPLANT
SYR CONTROL 10ML LL (SYRINGE) ×4 IMPLANT
TAPE STRIPS DRAPE STRL (GAUZE/BANDAGES/DRESSINGS) ×3 IMPLANT
TOWEL GREEN STERILE (TOWEL DISPOSABLE) ×4 IMPLANT
WATER STERILE IRR 1000ML POUR (IV SOLUTION) ×4 IMPLANT

## 2018-02-08 NOTE — Anesthesia Procedure Notes (Addendum)
Procedure Name: Intubation Date/Time: 02/08/2018 7:47 AM Performed by: Montez Morita, Aneli Zara W, CRNA Pre-anesthesia Checklist: Patient identified, Emergency Drugs available, Suction available and Patient being monitored Patient Re-evaluated:Patient Re-evaluated prior to induction Oxygen Delivery Method: Circle system utilized Preoxygenation: Pre-oxygenation with 100% oxygen Induction Type: IV induction Ventilation: Mask ventilation without difficulty Laryngoscope Size: Miller and 2 Grade View: Grade I Tube type: Oral Tube size: 7.0 mm Number of attempts: 1 Airway Equipment and Method: Stylet and Oral airway Placement Confirmation: ETT inserted through vocal cords under direct vision,  positive ETCO2 and breath sounds checked- equal and bilateral Secured at: 23 cm Tube secured with: Tape Dental Injury: Teeth and Oropharynx as per pre-operative assessment

## 2018-02-08 NOTE — Op Note (Signed)
    NAME: [name]    MRN: [id number] DOB: 1960-10-04    DATE OF OPERATION: 02/08/2018  PREOP DIAGNOSIS:    Recurrent right carotid stenosis  POSTOP DIAGNOSIS:    Same  PROCEDURE:    Redo right carotid endarterectomy with bovine pericardial patch angioplasty  SURGEON: Di Kindle. Edilia Bo, MD, FACS  ASSIST: Clinton Gallant, PA  ANESTHESIA: General  EBL: 100 cc  INDICATIONS:    [name] is a 57 y.o. female who it undergone a right carotid endarterectomy 12 years ago by Dr. Tammy Sours case.  She was found to have recurrent right carotid stenosis.  She was not felt to be a candidate for carotid stenting.  She presents for a redo right carotid endarterectomy  FINDINGS:   Very ulcerated 80% right carotid stenosis I remove the patch and placed a bovine pericardial patch after endarterectomy.   TECHNIQUE:   The patient was taken to the operating room and received a general anesthetic.  Arterial line had been placed by anesthesia.  The The right neck was prepped and draped in usual sterile fashion.  Incision was made along the anterior border of the sternocleidomastoid and the dissection carried down to the common carotid artery which was dissected free and controlled with Rummel tourniquet.  The facial vein was divided between 2-0 silk ties.  The internal carotid artery was controlled above the patch angioplasty.  The external carotid artery was controlled.  The patient was heparinized and ACT was monitored.  Clamp was then placed on the internal then the common and the external carotid artery.  A longitudinal arteriotomy was made in the common carotid artery and extended through the patch into the internal carotid artery above the plaque.  A 10 shunt was placed into the internal carotid artery, backbled and then placed in the common carotid artery and secured with Rummel tourniquet.  Flow was reestablished to the shunt.  An endarterectomy plane was established proximally and the  plaque was sharply divided.  Eversion endarterectomy was performed of the external carotid artery.  Distally there was a nice tapering the plaque.  I did place one tacking suture.  The artery was irrigated with copious amounts of heparin and dextran and all loose debris removed.  The Dacron patch was fully excised.  I then used a bovine pericardial patch which was sewn with continuous 6-0 Prolene suture.  Prior to completing the patch closure the shunt was removed.  The arteries were backbled and flushed appropriately and the anastomosis completed.  Flow was reestablished first to the external carotid artery and into the internal carotid artery.  At the completion was an excellent signal in the internal carotid artery with good diastolic flow.  The heparin was partially reversed with protamine.  The wound was then closed with a deep layer of 3-0 Vicryl, the platysma was closed with running 3-0 Vicryl, the skin was closed with 4-0 Vicryl.  Dermabond was applied.  The patient tolerated the procedure well was transferred to recovery room in stable condition.  All needle and sponge counts were correct.   Waverly Ferrari, MD, FACS [facility]  DATE OF DICTATION:   02/08/2018

## 2018-02-08 NOTE — Transfer of Care (Signed)
Immediate Anesthesia Transfer of Care Note  Patient: [name]  Procedure(s) Performed: ENDARTERECTOMY CAROTID REDO (Right Neck)  Patient Location: PACU  Anesthesia Type:General  Level of Consciousness: awake, alert  and oriented  Airway & Oxygen Therapy: Patient Spontanous Breathing and Patient connected to face mask oxygen  Post-op Assessment: Report given to RN and Post -op Vital signs reviewed and stable  Post vital signs: Reviewed and stable  Last Vitals:  Vitals Value Taken Time  BP 155/89 02/08/2018  9:57 AM  Temp 36.2 C 02/08/2018  9:57 AM  Pulse    Resp 27 02/08/2018  9:58 AM  SpO2    Vitals shown include unvalidated device data.  Last Pain:  Vitals:   02/08/18 0556  TempSrc: Oral  PainSc: 0-No pain      Patients Stated Pain Goal: 5 (02/08/18 0556)  Complications: No apparent anesthesia complications

## 2018-02-08 NOTE — Discharge Instructions (Signed)
° °  [facility] [place] ° °Discharge Instructions °  °Carotid Endarterectomy (CEA) ° °Please refer to the following instructions for your post-procedure care. Your surgeon or physician assistant will discuss any changes with you. ° °Activity ° °You are encouraged to walk as much as you can. You can slowly return to normal activities but must avoid strenuous activity and heavy lifting until your doctor tell you it's okay. Avoid activities such as vacuuming or swinging a golf club. You can drive after one week if you are comfortable and you are no longer taking prescription pain medications. It is normal to feel tired for serval weeks after your surgery. It is also normal to have difficulty with sleep habits, eating, and bowel movements after surgery. These will go away with time. ° °Bathing/Showering ° °Shower daily after you go home. Do not soak in a bathtub, hot tub, or swim until the incision heals completely. ° °Incision Care ° °Shower every day. Clean your incision with mild soap and water. Pat the area dry with a clean towel. You do not need a bandage unless otherwise instructed. Do not apply any ointments or creams to your incision. You may have skin glue on your incision. Do not peel it off. It will come off on its own in about one week. Your incision may feel thickened and raised for several weeks after your surgery. This is normal and the skin will soften over time.  ° °For Men Only: It's okay to shave around the incision but do not shave the incision itself for 2 weeks. It is common to have numbness under your chin that could last for several months. ° °Diet ° °Resume your normal diet. There are no special food restrictions following this procedure. A low fat/low cholesterol diet is recommended for all patients with vascular disease. In order to heal from your surgery, it is CRITICAL to get adequate nutrition. Your body requires vitamins, minerals, and protein. Vegetables are the  best source of vitamins and minerals. Vegetables also provide the perfect balance of protein. Processed food has little nutritional value, so try to avoid this. ° °Medications ° °Resume taking all of your medications unless your doctor or physician assistant tells you not to. If your incision is causing pain, you may take over-the- counter pain relievers such as acetaminophen (Tylenol). If you were prescribed a stronger pain medication, please be aware these medications can cause nausea and constipation. Prevent nausea by taking the medication with a snack or meal. Avoid constipation by drinking plenty of fluids and eating foods with a high amount of fiber, such as fruits, vegetables, and grains.  °Do not take Tylenol if you are taking prescription pain medications. ° °Follow Up ° °Our office will schedule a follow up appointment 2-3 weeks following discharge. ° °Please call us immediately for any of the following conditions ° °Increased pain, redness, drainage (pus) from your incision site. °Fever of 101 degrees or higher. °If you should develop stroke (slurred speech, difficulty swallowing, weakness on one side of your body, loss of vision) you should call 911 and go to the nearest emergency room. ° °Reduce your risk of vascular disease: ° °Stop smoking. If you would like help call [facility] at 1-800-QUIT-NOW (1-800-784-8669) or [facility] at 336-586-4000. °Manage your cholesterol °Maintain a desired weight °Control your diabetes °Keep your blood pressure down ° °If you have any questions, please call the office at [phone]. ° °

## 2018-02-08 NOTE — Anesthesia Procedure Notes (Signed)
Arterial Line Insertion Start/End5/14/2019 7:15 AM, 02/08/2018 7:25 AM Performed by: Leonides Grills, MD, anesthesiologist  Patient location: Pre-op. Preanesthetic checklist: patient identified, IV checked, site marked, risks and benefits discussed, surgical consent, monitors and equipment checked, pre-op evaluation, timeout performed and anesthesia consent Lidocaine 1% used for infiltration Left, radial was placed Catheter size: 20 Fr Hand hygiene performed  and maximum sterile barriers used   Attempts: 1 Procedure performed using ultrasound guided technique. Ultrasound Notes:anatomy identified, needle tip was noted to be adjacent to the nerve/plexus identified and no ultrasound evidence of intravascular and/or intraneural injection Following insertion, dressing applied and Biopatch. Post procedure assessment: normal and unchanged  Patient tolerated the procedure well with no immediate complications.

## 2018-02-08 NOTE — Interval H&P Note (Signed)
History and Physical Interval Note:  02/08/2018 7:22 AM  [name]  has presented today for surgery, with the diagnosis of right carotid restenosis  The various methods of treatment have been discussed with the patient and family. After consideration of risks, benefits and other options for treatment, the patient has consented to  Procedure(s): ENDARTERECTOMY CAROTID REDO (Right) POSSIBLE RIGHT THIGH VEIN HARVEST (Right) as a surgical intervention .  The patient's history has been reviewed, patient examined, no change in status, stable for surgery.  I have reviewed the patient's chart and labs.  Questions were answered to the patient's satisfaction.     Waverly Ferrari

## 2018-02-08 NOTE — Progress Notes (Addendum)
  Day of Surgery Note  Subjective:  No complaints; wants some ice cream; denies difficulty swallowing  Vitals:   02/08/18 1342 02/08/18 1454  BP: 140/75 (!) 163/102  Pulse: 69 67  Resp: 17 18  Temp:  98.6 F (37 C)  SpO2: 97% 99%   Incisions:   Clean and dry without hematoma Extremities:  Moving all extremities equally Cardiac:  regular Lungs:  Non labored Neuro:  In tact; tongue is midline  Assessment/Plan:  This is a 57 y.o. female who is s/p  Redo right carotid endarterectomy  -pt doing well post op and neuro is in tact -if night is uneventful, anticipate discharge tomorrow  Doreatha Massed, PA-C 02/08/2018 3:47 PM [phone]  I have interviewed the patient and examined the patient. I agree with the findings by the PA. Doing well postop.  Anticipate discharge in a.m.  Cari Caraway, MD [phone]

## 2018-02-08 NOTE — Progress Notes (Signed)
Paged MD about patient's diet.

## 2018-02-08 NOTE — Anesthesia Postprocedure Evaluation (Signed)
Anesthesia Post Note  Patient: [name]  Procedure(s) Performed: ENDARTERECTOMY CAROTID REDO (Right Neck)     Patient location during evaluation: PACU Anesthesia Type: General Level of consciousness: awake and alert Pain management: pain level controlled Vital Signs Assessment: post-procedure vital signs reviewed and stable Respiratory status: spontaneous breathing, nonlabored ventilation, respiratory function stable and patient connected to nasal cannula oxygen Cardiovascular status: blood pressure returned to baseline and stable Postop Assessment: no apparent nausea or vomiting Anesthetic complications: no    Last Vitals:  Vitals:   02/08/18 1454 02/08/18 1957  BP: (!) 163/102 (!) 165/97  Pulse: 67 (!) 118  Resp: 18 19  Temp: 37 C 36.5 C  SpO2: 99% 98%    Last Pain:  Vitals:   02/08/18 1957  TempSrc: Axillary  PainSc:                  Catheryn Bacon Parthiv Mucci

## 2018-02-09 ENCOUNTER — Telehealth: Payer: Self-pay | Admitting: Vascular Surgery

## 2018-02-09 ENCOUNTER — Encounter (HOSPITAL_COMMUNITY): Payer: Self-pay | Admitting: Vascular Surgery

## 2018-02-09 LAB — BASIC METABOLIC PANEL
Anion gap: 12 (ref 5–15)
BUN: 11 mg/dL (ref 6–20)
CHLORIDE: 103 mmol/L (ref 101–111)
CO2: 23 mmol/L (ref 22–32)
CREATININE: 0.69 mg/dL (ref 0.44–1.00)
Calcium: 8.8 mg/dL — ABNORMAL LOW (ref 8.9–10.3)
GFR calc non Af Amer: >60 mL/min (ref >60)
Glucose, Bld: 110 mg/dL — ABNORMAL HIGH (ref 65–99)
POTASSIUM: 4.4 mmol/L (ref 3.5–5.1)
SODIUM: 138 mmol/L (ref 135–145)

## 2018-02-09 LAB — CBC
HEMATOCRIT: 42.9 % (ref 36.0–46.0)
HEMOGLOBIN: 13.7 g/dL (ref 12.0–15.0)
MCH: 29.6 pg (ref 26.0–34.0)
MCHC: 31.9 g/dL (ref 30.0–36.0)
MCV: 92.7 fL (ref 78.0–100.0)
Platelets: 293 10*3/uL (ref 150–400)
RBC: 4.63 MIL/uL (ref 3.87–5.11)
RDW: 12.7 % (ref 11.5–15.5)
WBC: 14.7 10*3/uL — ABNORMAL HIGH (ref 4.0–10.5)

## 2018-02-09 LAB — POCT ACTIVATED CLOTTING TIME: Activated Clotting Time: 230 seconds

## 2018-02-09 MED ORDER — OXYCODONE HCL 5 MG PO TABS: 5.0000 mg | ORAL_TABLET | Freq: Four times a day (QID) | ORAL | 0 refills | Status: DC | PRN

## 2018-02-09 NOTE — Progress Notes (Signed)
[facility] [place]  Subjective  - Doing well.   Objective (!) 168/91 (!) 111 98 F (36.7 C) (Oral) (!) 28 97%  Intake/Output Summary (Last 24 hours) at 02/09/2018 0759 Last data filed at 02/09/2018 0539 Gross per 24 hour  Intake 2950.83 ml  Output 2500 ml  Net 450.83 ml    Right neck incision without hematoma No tongue deviation and smile is symetric Moving all 4 ext, grip 5/5, sensation intact Heart RRR Lungs non labored breathing  Assessment/Planning: POD # 1 right redo CEA  Hypertension, but improved from prior to surgery.  Patient managed with Norvasc, Metoprolol, and Lisinopril and followed by Primary care physician out patient.  Cont. Daily aspirin 325 mg Discharge home today in stable condition.   Mosetta Pigeon 02/09/2018 7:59 AM --  Laboratory Lab Results: Recent Labs    02/08/18 1558 02/09/18 0336  WBC 13.3* 14.7*  HGB 14.0 13.7  HCT 42.6 42.9  PLT 351 293   BMET Recent Labs    02/08/18 1558 02/09/18 0336  NA  --  138  K  --  4.4  CL  --  103  CO2  --  23  GLUCOSE  --  110*  BUN  --  11  CREATININE 0.73 0.69  CALCIUM  --  8.8*    COAG Lab Results  Component Value Date   INR 0.98 02/01/2018   INR 1.00 04/27/2017   INR 0.92 04/15/2017   No results found for: PTT

## 2018-02-09 NOTE — Telephone Encounter (Signed)
-----   Message from Sharee Pimple, RN sent at 02/09/2018 10:02 AM EDT ----- Regarding: 2-3 weeks postop CEA   ----- Message ----- From: Lars Mage, PA-C Sent: 02/09/2018   8:05 AM To: Vvs Charge Pool  F/U in 2-3 weeks with Dr. Edilia Bo s/p redo right CEA

## 2018-02-09 NOTE — Telephone Encounter (Signed)
Sch appt 03/09/18 345pm f/u/ MD lm waining on pt to confirm

## 2018-02-10 NOTE — Discharge Summary (Signed)
[facility] Discharge Summary   Patient ID:  [name] MRN: [id number] DOB/AGE: Oct 16, 1960 57 y.o.  Admit date: 02/08/2018 Discharge date: 02/10/2018 Date of Surgery: 02/08/2018 Surgeon: Surgeon(s): Chuck Hint, MD  Admission Diagnosis: right carotid restenosis  Discharge Diagnoses:  right carotid restenosis  Secondary Diagnoses: Past Medical History:  Diagnosis Date  . AAA (abdominal aortic aneurysm) without rupture (HCC) 08/25/2016  . Anxiety   . Arthritis    knees  . Bipolar 1 disorder (HCC)   . CAD (coronary artery disease)    a. 08/08/16 LAD 70%, 60% 1st diag, CTO RCA Med Rx EF 55%  . Carotid artery stenosis    Right carotid endarterectomy 07/22/05 with recurrent stenosis 01/2017; known left ICA occlusion  . Crack cocaine use   . Depression   . Dyspnea    "coughing spells"  . Headache    ocassionally  . Hypertension   . Myocardial infarction (HCC)   . Right Groin Hematoma    a. 07/2016 following diagnostic cath  . Stroke [facility]) 2017   "light stroke"   . Tobacco abuse     Procedure(s): ENDARTERECTOMY CAROTID REDO  Discharged Condition: good  HPI:  57 y/o female followed by our office for carotid surveillance.  Patient had a right carotid endarterectomy by Dr. Liliane Bade 12 years ago. She was seen 05/26/2017 with recurrent right carotid stenosis and known left ICA occlusion.  she denies any history of stroke, TIAs, expressive or receptive aphasia, or amaurosis fugax.   Asymptomatic recurrent ICA stenosis.  Dr. Edilia Bo recommended right ICA endarterectomy to lower her stroke risk.  She was scheduled for surgery 02/08/2018.     Hospital Course:  [name] is a 57 y.o. female is S/P Right Procedure(s): ENDARTERECTOMY CAROTID REDO She has had an uneventful moving all 4 extremities, right neck incision without hematoma.  No tongue deviation and smile is symmetric.  Discharge disposition stable follow up in our office in 2-3  weeks.     Significant Diagnostic Studies: CBC Lab Results  Component Value Date   WBC 14.7 (H) 02/09/2018   HGB 13.7 02/09/2018   HCT 42.9 02/09/2018   MCV 92.7 02/09/2018   PLT 293 02/09/2018    BMET    Component Value Date/Time   NA 138 02/09/2018 0336   K 4.4 02/09/2018 0336   CL 103 02/09/2018 0336   CO2 23 02/09/2018 0336   GLUCOSE 110 (H) 02/09/2018 0336   BUN 11 02/09/2018 0336   CREATININE 0.69 02/09/2018 0336   CALCIUM 8.8 (L) 02/09/2018 0336   GFRNONAA >60 02/09/2018 0336   GFRAA >60 02/09/2018 0336   COAG Lab Results  Component Value Date   INR 0.98 02/01/2018   INR 1.00 04/27/2017   INR 0.92 04/15/2017     Disposition:  Discharge to :Home Discharge Instructions    Call MD for:  redness, tenderness, or signs of infection (pain, swelling, bleeding, redness, odor or green/yellow discharge around incision site)   Complete by:  As directed    Call MD for:  severe or increased pain, loss or decreased feeling  in affected limb(s)   Complete by:  As directed    Call MD for:  temperature >100.5   Complete by:  As directed    Resume previous diet   Complete by:  As directed      Allergies as of 02/09/2018      Reactions   Penicillins Hives, Swelling   PATIENT HAS HAD A  PCN REACTION WITH IMMEDIATE RASH, FACIAL/TONGUE/THROAT SWELLING, SOB, OR LIGHTHEADEDNESS WITH HYPOTENSION:  #  #  YES  #  # HAS PT DEVELOPED SEVERE RASH INVOLVING MUCUS MEMBRANES or SKIN NECROSIS: #  #  YES  #  # Has patient had a PCN reaction that required hospitalization No Has patient had a PCN reaction occurring within the last 10 years: Non If all of the above answers are "NO", then may proceed with Cephalosporin use.      Medication List    TAKE these medications   acetaminophen 325 MG tablet Commonly known as:  TYLENOL Take 650-975 mg by mouth 3 (three) times daily as needed for moderate pain or headache.   amLODipine 10 MG tablet Commonly known as:  NORVASC Take 1 tablet  (10 mg total) by mouth daily.   aspirin EC 325 MG tablet Take 325 mg by mouth daily.   atorvastatin 80 MG tablet Commonly known as:  LIPITOR TAKE 1 Tablet BY MOUTH ONCE DAILY AT 6 PM   BC FAST PAIN RELIEF 650-195-33.3 MG Pack Generic drug:  Aspirin-Salicylamide-Caffeine Take 1 packet by mouth daily as needed (for pain/headaches.). BC Powders   cloNIDine 0.2 MG tablet Commonly known as:  CATAPRES Take 1 tablet (0.2 mg total) by mouth 2 (two) times daily.   lisinopril 40 MG tablet Commonly known as:  PRINIVIL,ZESTRIL Take 1 tablet (40 mg total) by mouth daily. What changed:    how much to take  when to take this   metoprolol tartrate 100 MG tablet Commonly known as:  LOPRESSOR Take 2 tablets (200 mg total) by mouth 2 (two) times daily.   NITROSTAT 0.4 MG SL tablet Generic drug:  nitroGLYCERIN PLACE 1 TABLET UNDER TONGUE AS NEEDED FOR CHEST PAIN EVERY 5 MINUTES X 3 MAX DOSES.  CALL 911 IF PAIN PERSISTS   oxyCODONE 5 MG immediate release tablet Commonly known as:  Oxy IR/ROXICODONE Take 1 tablet (5 mg total) by mouth every 6 (six) hours as needed for moderate pain.   PROVENTIL HFA 108 (90 Base) MCG/ACT inhaler Generic drug:  albuterol INHALE 2 PUFFS BY MOUTH EVERY 6 HOURS AS NEEDED FOR COUGHING, WHEEZING, OR SHORTNESS OF BREATH      Verbal and written Discharge instructions given to the patient. Wound care per Discharge AVS Follow-up Information    Chuck Hint, MD Follow up in 2 week(s).   Specialties:  Vascular Surgery, Cardiology Why:  office will call Contact information: [address] [place] [address] [phone]           Signed: Mosetta Pigeon 02/10/2018, 12:06 PM --- For VQI Registry use --- Instructions: Press F2 to tab through selections.  Delete question if not applicable.   Modified Rankin score at D/C (0-6): Rankin Score=0  IV medication needed for:  1. Hypertension: Yes 2. Hypotension: No  Post-op Complications:  No  1. Post-op CVA or TIA: No  If yes: Event classification (right eye, left eye, right cortical, left cortical, verterobasilar, other):   If yes: Timing of event (intra-op, <6 hrs post-op, >=6 hrs post-op, unknown):   2. CN injury: No  If yes: CN  injuried   3. Myocardial infarction: No  If yes: Dx by (EKG or clinical, Troponin):   4.  CHF: No  5.  Dysrhythmia (new): No  6. Wound infection: No   7. Reperfusion symptoms: No  8. Return to OR: No  If yes: return to OR for (bleeding, neurologic, other CEA incision, other):  Discharge medications: Statin use:  Yes ASA use:  Yes Beta blocker use:  Yes ACE-Inhibitor use:  Yes P2Y12 Antagonist use: [x ] None,  Plavix,  Plasugrel,  Ticlopinine,  Ticagrelor,  Other,  No for medical reason,  Non-compliant,  Not-indicated Anti-coagulant use:  [x ] None,  Warfarin,  Rivaroxaban,  Dabigatran,  Other,  No for medical reason,  Non-compliant,  Not-indicated

## 2018-02-11 ENCOUNTER — Other Ambulatory Visit: Payer: Self-pay | Admitting: *Deleted

## 2018-02-11 MED ORDER — OXYCODONE HCL 5 MG PO TABS: 5.0000 mg | ORAL_TABLET | Freq: Four times a day (QID) | ORAL | 0 refills | Status: DC | PRN

## 2018-02-11 NOTE — Progress Notes (Signed)
Patient received #6 tablets at discharge, she is having pain on right side of neck. Incision is reported as being CDI per patient. She is having trouble swallowing d/t pain only. Will rx the remaining 9 tablets from our protocol for CEA, OK'd by Dr. Myra Gianotti. I told patient that she should take ibuprofen or tylenol when possible and that this RX refill will have to last her until she is seen for her postop appt in a couple of weeks. She voiced understanding and agreement of this plan.   Checked the PMR database and the patient has only gotten our discharge Rx filled.

## 2018-02-25 ENCOUNTER — Other Ambulatory Visit: Payer: Self-pay | Admitting: Physician Assistant

## 2018-02-28 ENCOUNTER — Other Ambulatory Visit: Payer: Self-pay | Admitting: Physician Assistant

## 2018-02-28 DIAGNOSIS — I739 Peripheral vascular disease, unspecified: Secondary | ICD-10-CM

## 2018-02-28 DIAGNOSIS — I779 Disorder of arteries and arterioles, unspecified: Secondary | ICD-10-CM

## 2018-02-28 DIAGNOSIS — I10 Essential (primary) hypertension: Secondary | ICD-10-CM

## 2018-02-28 DIAGNOSIS — I2510 Atherosclerotic heart disease of native coronary artery without angina pectoris: Secondary | ICD-10-CM

## 2018-02-28 DIAGNOSIS — E785 Hyperlipidemia, unspecified: Secondary | ICD-10-CM

## 2018-03-08 ENCOUNTER — Encounter: Payer: Self-pay | Admitting: Physician Assistant

## 2018-03-08 ENCOUNTER — Ambulatory Visit: Payer: Self-pay | Admitting: Physician Assistant

## 2018-03-08 VITALS — BP 130/84 | HR 55 | Temp 97.5°F | Ht 64.0 in | Wt 170.8 lb

## 2018-03-08 DIAGNOSIS — I2510 Atherosclerotic heart disease of native coronary artery without angina pectoris: Secondary | ICD-10-CM

## 2018-03-08 DIAGNOSIS — I714 Abdominal aortic aneurysm, without rupture, unspecified: Secondary | ICD-10-CM

## 2018-03-08 DIAGNOSIS — E785 Hyperlipidemia, unspecified: Secondary | ICD-10-CM

## 2018-03-08 DIAGNOSIS — I10 Essential (primary) hypertension: Secondary | ICD-10-CM

## 2018-03-08 DIAGNOSIS — I779 Disorder of arteries and arterioles, unspecified: Secondary | ICD-10-CM

## 2018-03-08 DIAGNOSIS — I739 Peripheral vascular disease, unspecified: Secondary | ICD-10-CM

## 2018-03-08 NOTE — Progress Notes (Signed)
BP (!) 124/92 (BP Location: Left Arm, Patient Position: Sitting, Cuff Size: Normal)   Pulse (!) 55   Temp (!) 97.5 F (36.4 C)   Ht 5\' 4"  (1.626 m)   Wt 170 lb 12 oz (77.5 kg)   LMP 07/18/2011   SpO2 99%   BMI 29.31 kg/m    Subjective:    Patient ID: [name], female    DOB: 07/08/1961, 57 y.o.   MRN: [id number]  HPI: [name] is a 57 y.o. female presenting on 03/08/2018 for Hypertension   HPI   Pt has finally had her redo carotid endarerectomy.  She says it went well.  She has follow-up with surgeon tomorrow.   She has appt to see her cardiologist later this month.   Pt says she has stopped using cocaine and she has stopped smoking cigarettes.  She says she continues to use marijuana.   She says her neck is sore but she is otherwise doing well.   AAA stable by US done on 10/13/17  Relevant past medical, surgical, family and social history reviewed and updated as indicated. Interim medical history since our last visit reviewed. Allergies and medications reviewed and updated.   Current Outpatient Medications:  .  acetaminophen (TYLENOL) 325 MG tablet, Take 650-975 mg by mouth 3 (three) times daily as needed for moderate pain or headache., Disp: , Rfl:  .  amLODipine (NORVASC) 10 MG tablet, Take 1 tablet (10 mg total) by mouth daily., Disp: 90 tablet, Rfl: 1 .  aspirin EC 325 MG tablet, Take 325 mg by mouth daily., Disp: , Rfl:  .  atorvastatin (LIPITOR) 80 MG tablet, TAKE 1 Tablet BY MOUTH ONCE DAILY AT 6 PM, Disp: 90 tablet, Rfl: 0 .  cloNIDine (CATAPRES) 0.2 MG tablet, Take 1 tablet (0.2 mg total) by mouth 2 (two) times daily., Disp: 180 tablet, Rfl: 1 .  lisinopril (PRINIVIL,ZESTRIL) 40 MG tablet, Take 1 tablet (40 mg total) by mouth daily., Disp: 90 tablet, Rfl: 1 .  metoprolol tartrate (LOPRESSOR) 100 MG tablet, Take 2 tablets (200 mg total) by mouth 2 (two) times daily., Disp: 360 tablet, Rfl: 1 .  NITROSTAT 0.4 MG SL tablet, PLACE 1 TABLET UNDER TONGUE  AS NEEDED FOR CHEST PAIN EVERY 5 MINUTES X 3 MAX DOSES.  CALL 911 IF PAIN PERSISTS, Disp: 100 tablet, Rfl: 0 .  PROVENTIL HFA 108 (90 Base) MCG/ACT inhaler, INHALE 2 PUFFS BY MOUTH EVERY 6 HOURS AS NEEDED FOR COUGHING, WHEEZING, OR SHORTNESS OF BREATH, Disp: 20.1 g, Rfl: 1 .  oxyCODONE (OXY IR/ROXICODONE) 5 MG immediate release tablet, Take 1 tablet (5 mg total) by mouth every 6 (six) hours as needed for moderate pain. (Patient not taking: Reported on 03/08/2018), Disp: 9 tablet, Rfl: 0   Review of Systems  Constitutional: Negative for appetite change, chills, diaphoresis, fatigue, fever and unexpected weight change.  HENT: Negative for congestion, dental problem, drooling, ear pain, facial swelling, hearing loss, mouth sores, sneezing, sore throat, trouble swallowing and voice change.   Eyes: Negative for pain, discharge, redness, itching and visual disturbance.  Respiratory: Positive for cough. Negative for choking, shortness of breath and wheezing.   Cardiovascular: Negative for chest pain, palpitations and leg swelling.  Gastrointestinal: Negative for abdominal pain, blood in stool, constipation, diarrhea and vomiting.  Endocrine: Negative for cold intolerance, heat intolerance and polydipsia.  Genitourinary: Negative for decreased urine volume, dysuria and hematuria.  Musculoskeletal: Positive for arthralgias. Negative for back pain and gait problem.  Skin: Negative for rash.  Allergic/Immunologic: Negative for environmental allergies.  Neurological: Positive for headaches. Negative for seizures, syncope and light-headedness.  Hematological: Negative for adenopathy.  Psychiatric/Behavioral: Positive for agitation and dysphoric mood. Negative for suicidal ideas. The patient is nervous/anxious.     Per HPI unless specifically indicated above     Objective:    BP (!) 124/92 (BP Location: Left Arm, Patient Position: Sitting, Cuff Size: Normal)   Pulse (!) 55   Temp (!) 97.5 F (36.4 C)    Ht 5\' 4"  (1.626 m)   Wt 170 lb 12 oz (77.5 kg)   LMP 07/18/2011   SpO2 99%   BMI 29.31 kg/m   Wt Readings from Last 3 Encounters:  03/08/18 170 lb 12 oz (77.5 kg)  02/08/18 170 lb 6.4 oz (77.3 kg)  02/01/18 170 lb 6.4 oz (77.3 kg)    Physical Exam  Constitutional: She is oriented to person, place, and time. She appears well-developed and well-nourished.  HENT:  Head: Normocephalic and atraumatic.  Neck: Neck supple.  Surgical area R neck without swelling redness or drainage.  Appears to be healing appropriately  Cardiovascular: Normal rate and regular rhythm.  Pulmonary/Chest: Effort normal and breath sounds normal.  Abdominal: Soft. Bowel sounds are normal. She exhibits no mass. There is no hepatosplenomegaly. There is no tenderness.  Musculoskeletal: She exhibits no edema.  Lymphadenopathy:    She has no cervical adenopathy.  Neurological: She is alert and oriented to person, place, and time.  Skin: Skin is warm and dry.  Psychiatric: She has a normal mood and affect. Her behavior is normal.  Vitals reviewed.       Assessment & Plan:   Encounter Diagnoses  Name Primary?  . Essential hypertension Yes  . Dyslipidemia   . Bilateral carotid artery disease, unspecified type (HCC)   . Coronary artery disease involving native coronary artery of native heart without angina pectoris   . AAA (abdominal aortic aneurysm) without rupture (HCC)      -Pt to get fasting labs drawn tomorrow morning.  We will call pt with results -Pt to follow up with surgeon tomorrw and cardiology later this month as scheduled.  -congratulated pt on stopping cocaine and cigarettes -no changes to medications today -pt to follow up in 3 months.  RTO sooner prn

## 2018-03-09 ENCOUNTER — Ambulatory Visit (INDEPENDENT_AMBULATORY_CARE_PROVIDER_SITE_OTHER): Payer: Self-pay | Admitting: Vascular Surgery

## 2018-03-09 ENCOUNTER — Other Ambulatory Visit: Payer: Self-pay

## 2018-03-09 ENCOUNTER — Encounter: Payer: Self-pay | Admitting: Vascular Surgery

## 2018-03-09 VITALS — BP 117/79 | HR 54 | Temp 98.4°F | Resp 18 | Ht 64.0 in | Wt 171.4 lb

## 2018-03-09 DIAGNOSIS — Z48812 Encounter for surgical aftercare following surgery on the circulatory system: Secondary | ICD-10-CM

## 2018-03-09 NOTE — Progress Notes (Signed)
Patient name: [name] MRN: [id number] DOB: 1960/12/13 Sex: female  REASON FOR VISIT:   Follow-up after redo right carotid endarterectomy.  HPI:   [name] is a pleasant 57 y.o. female who it undergone a previous right carotid endarterectomy 12 years ago by Dr. Liliane Bade.  She was found to have a recurrent right carotid stenosis.  She was not felt to be a candidate for carotid stenting.  She underwent a redo right carotid endarterectomy with bovine pericardial patch angioplasty on 02/08/2018.  She did well postoperatively and was discharged on postoperative day #1.  Of note the patient has a known left internal carotid artery occlusion.  Since I saw her in the hospital she is doing well.  She denies any focal weakness or paresthesias.  She has no problems swallowing or hoarseness.  Current Outpatient Medications  Medication Sig Dispense Refill  . acetaminophen (TYLENOL) 325 MG tablet Take 650-975 mg by mouth 3 (three) times daily as needed for moderate pain or headache.    Marland Kitchen amLODipine (NORVASC) 10 MG tablet Take 1 tablet (10 mg total) by mouth daily. 90 tablet 1  . aspirin EC 325 MG tablet Take 325 mg by mouth daily.    Marland Kitchen atorvastatin (LIPITOR) 80 MG tablet TAKE 1 Tablet BY MOUTH ONCE DAILY AT 6 PM 90 tablet 0  . cloNIDine (CATAPRES) 0.2 MG tablet Take 1 tablet (0.2 mg total) by mouth 2 (two) times daily. 180 tablet 1  . lisinopril (PRINIVIL,ZESTRIL) 40 MG tablet Take 1 tablet (40 mg total) by mouth daily. 90 tablet 1  . metoprolol tartrate (LOPRESSOR) 100 MG tablet Take 2 tablets (200 mg total) by mouth 2 (two) times daily. 360 tablet 1  . NITROSTAT 0.4 MG SL tablet PLACE 1 TABLET UNDER TONGUE AS NEEDED FOR CHEST PAIN EVERY 5 MINUTES X 3 MAX DOSES.  CALL 911 IF PAIN PERSISTS 100 tablet 0  . oxyCODONE (OXY IR/ROXICODONE) 5 MG immediate release tablet Take 1 tablet (5 mg total) by mouth every 6 (six) hours as needed for moderate pain. 9 tablet 0  . PROVENTIL HFA 108 (90 Base)  MCG/ACT inhaler INHALE 2 PUFFS BY MOUTH EVERY 6 HOURS AS NEEDED FOR COUGHING, WHEEZING, OR SHORTNESS OF BREATH 20.1 g 1   No current facility-administered medications for this visit.     REVIEW OF SYSTEMS:  [X]  denotes positive finding, [ ]  denotes negative finding Cardiac  Comments:  Chest pain or chest pressure:    Shortness of breath upon exertion:    Short of breath when lying flat:    Irregular heart rhythm:    Constitutional    Fever or chills:     PHYSICAL EXAM:   Vitals:   03/09/18 1550 03/09/18 1553  BP: 118/80 117/79  Pulse: (!) 54   Resp: 18   Temp: 98.4 F (36.9 C)   TempSrc: Oral   SpO2: 99%   Weight: 171 lb 6.4 oz (77.7 kg)   Height: 5\' 4"  (1.626 m)     GENERAL: The patient is a well-nourished female, in no acute distress. The vital signs are documented above. CARDIOVASCULAR: There is a regular rate and rhythm. PULMONARY: There is good air exchange bilaterally without wheezing or rales. Her right neck incision is healing nicely. NEURO: She has no focal weakness or paresthesias.  DATA:   No new data.  MEDICAL ISSUES:   STATUS POST REDO RIGHT CAROTID ENDARTERECTOMY: Patient doing well status post redo right carotid endarterectomy.  She has a  known left internal carotid artery occlusion.  I have ordered a follow-up carotid duplex scan in 6 months and I will see her back at that time.  She is on aspirin and is on a statin.  She knows to call sooner if she has problems.  Waverly Ferrarihristopher Dickson [facility] [phone]

## 2018-03-11 ENCOUNTER — Other Ambulatory Visit: Payer: Self-pay

## 2018-03-11 DIAGNOSIS — I6523 Occlusion and stenosis of bilateral carotid arteries: Secondary | ICD-10-CM

## 2018-03-20 ENCOUNTER — Encounter: Payer: Self-pay | Admitting: Cardiology

## 2018-03-20 NOTE — Progress Notes (Deleted)
Cardiology Office Note  Date: 03/20/2018   ID: [name] LoftyDebra G Husak, DOB 02/22/1961, MRN [id number]  PCP: Jacquelin HawkingMcElroy, Shannon, PA-C  Primary Cardiologist: Nona DellSamuel McDowell, MD   No chief complaint on file.   History of Present Illness: [name] is a medically complex 57 y.o. female last seen in October 2018.  She is following with Dr. Edilia Boickson, has a history of remote right CEA with recurrent right carotid artery stenosis requiring redo right CEA with bovine pericardial patch angioplasty in May of this year.  She has known occlusion of the LICA.  Blood pressure at recent visit on June 12 was recorded 118/80.  Past Medical History:  Diagnosis Date  . AAA (abdominal aortic aneurysm) without rupture (HCC) 08/25/2016  . Anxiety   . Arthritis   . Bipolar 1 disorder (HCC)   . CAD (coronary artery disease)    a. 08/08/16 LAD 70%, 60% 1st diag, CTO RCA Med Rx EF 55%  . Carotid artery stenosis    Right carotid endarterectomy 07/22/05 with recurrent stenosis 01/2017; known left ICA occlusion  . Crack cocaine use   . Depression   . Headache   . Hypertension   . Myocardial infarction (HCC)   . Right Groin Hematoma    a. 07/2016 following diagnostic cath  . Stroke [facility](HCC) 2017    Past Surgical History:  Procedure Laterality Date  . CARDIAC CATHETERIZATION N/A 08/07/2016   Procedure: Left Heart Cath and Coronary Angiography;  Surgeon: Lennette Biharihomas A Kelly, MD;  [facility];  Service: Cardiovascular;  Laterality: N/A;  . CYST REMOVAL TRUNK Left    beneath L breast  . ENDARTERECTOMY Right 02/08/2018   Procedure: ENDARTERECTOMY CAROTID REDO;  Surgeon: Chuck Hintickson, Christopher S, MD;  [facility];  Service: Vascular;  Laterality: Right;  . TUBAL LIGATION    . VASCULAR SURGERY Right    right carotid endarterectomy 07/22/05     Current Outpatient Medications  Medication Sig Dispense Refill  . acetaminophen (TYLENOL) 325 MG tablet Take 650-975 mg by mouth 3 (three) times daily  as needed for moderate pain or headache.    Marland Kitchen. amLODipine (NORVASC) 10 MG tablet Take 1 tablet (10 mg total) by mouth daily. 90 tablet 1  . aspirin EC 325 MG tablet Take 325 mg by mouth daily.    Marland Kitchen. atorvastatin (LIPITOR) 80 MG tablet TAKE 1 Tablet BY MOUTH ONCE DAILY AT 6 PM 90 tablet 0  . cloNIDine (CATAPRES) 0.2 MG tablet Take 1 tablet (0.2 mg total) by mouth 2 (two) times daily. 180 tablet 1  . lisinopril (PRINIVIL,ZESTRIL) 40 MG tablet Take 1 tablet (40 mg total) by mouth daily. 90 tablet 1  . metoprolol tartrate (LOPRESSOR) 100 MG tablet Take 2 tablets (200 mg total) by mouth 2 (two) times daily. 360 tablet 1  . NITROSTAT 0.4 MG SL tablet PLACE 1 TABLET UNDER TONGUE AS NEEDED FOR CHEST PAIN EVERY 5 MINUTES X 3 MAX DOSES.  CALL 911 IF PAIN PERSISTS 100 tablet 0  . oxyCODONE (OXY IR/ROXICODONE) 5 MG immediate release tablet Take 1 tablet (5 mg total) by mouth every 6 (six) hours as needed for moderate pain. 9 tablet 0  . PROVENTIL HFA 108 (90 Base) MCG/ACT inhaler INHALE 2 PUFFS BY MOUTH EVERY 6 HOURS AS NEEDED FOR COUGHING, WHEEZING, OR SHORTNESS OF BREATH 20.1 g 1   No current facility-administered medications for this visit.    Allergies:  Penicillins   Social History: The patient  reports that she  has been smoking cigarettes.  She has a 21.00 pack-year smoking history. She has never used smokeless tobacco. She reports that she has current or past drug history. Drugs: Marijuana and "Crack" cocaine. She reports that she does not drink alcohol.   Family History: The patient's family history includes COPD in her paternal uncle; Cancer in her maternal aunt, maternal uncle, and paternal uncle; Diabetes in her mother; Heart attack in her father and mother; Heart disease in her father and mother; Hypertension in her father and mother; Stroke in her mother.   ROS:  Please see the history of present illness. Otherwise, complete review of systems is positive for {NONE DEFAULTED:18576::"none"}.  All  other systems are reviewed and negative.   Physical Exam: VS:  LMP 07/18/2011 , BMI There is no height or weight on file to calculate BMI.  Wt Readings from Last 3 Encounters:  03/09/18 171 lb 6.4 oz (77.7 kg)  03/08/18 170 lb 12 oz (77.5 kg)  02/08/18 170 lb 6.4 oz (77.3 kg)    General: Patient appears comfortable at rest. HEENT: Conjunctiva and lids normal, oropharynx clear with moist mucosa. Neck: Supple, no elevated JVP or carotid bruits, no thyromegaly. Lungs: Clear to auscultation, nonlabored breathing at rest. Cardiac: Regular rate and rhythm, no S3 or significant systolic murmur, no pericardial rub. Abdomen: Soft, nontender, no hepatomegaly, bowel sounds present, no guarding or rebound. Extremities: No pitting edema, distal pulses 2+. Skin: Warm and dry. Musculoskeletal: No kyphosis. Neuropsychiatric: Alert and oriented x3, affect grossly appropriate.  ECG: I personally reviewed the tracing from 07/28/2017 which showed sinus bradycardia with old inferior infarct pattern and decreased R wave progression.  Recent Labwork: 02/01/2018: ALT 21; AST 22 02/09/2018: BUN 11; Creatinine, Ser 0.69; Hemoglobin 13.7; Platelets 293; Potassium 4.4; Sodium 138     Component Value Date/Time   CHOL 124 10/13/2017 0958   TRIG 101 10/13/2017 0958   HDL 37 (L) 10/13/2017 0958   CHOLHDL 3.4 10/13/2017 0958   VLDL 20 10/13/2017 0958   LDLCALC 67 10/13/2017 0958    Other Studies Reviewed Today:  Echocardiogram 08/08/2016: Study Conclusions  - Left ventricle: The cavity size was normal. There was mild concentric hypertrophy. Systolic function was normal. The estimated ejection fraction was in the range of 55% to 60%. Wall motion was normal; there were no regional wall motion abnormalities. Doppler parameters are consistent with abnormal left ventricular relaxation (grade 1 diastolic dysfunction). - Left atrium: The atrium was mildly dilated. - Atrial septum: No defect or patent  foramen ovale was identified.  Cardiac catheterization 08/07/2016:  There is mild left ventricular systolic dysfunction.  The left ventricular ejection fraction is 45-50% by visual estimate.  There is no mitral valve regurgitation.  LV end diastolic pressure is mildly elevated.  Mid RCA lesion, 100 %stenosed.  Dist RCA lesion, 75 %stenosed.  Mid LAD-2 lesion, 60 %stenosed.  Mid LAD-1 lesion, 70 %stenosed.  Dist LAD-2 lesion, 45 %stenosed.  Dist LAD-1 lesion, 50 %stenosed.  2nd Mrg lesion, 40 %stenosed.  Moderate LV dysfunction with upper mid to basal inferior hypocontractility. Global ejection fraction is 45-50%.  Multivessel CAD with calcification of the proximal LAD with 70% followed by 60% stenoses between the first and second diagonal vessel with segmental 50% stenoses in the mid distal LAD; 40-50% diffuse narrowing in the distal circumflex marginal branch; an old total occlusion of the proximal RCA with bridging collaterals.  Dopplers 01/26/2018: Final Interpretation: Right Carotid: Velocities in the right ICA are consistent with a 80-99% stenosis.  No significant changes noted when compared to previous exam of 02/10/2017.  Vertebrals: Right vertebral artery demonstrates antegrade flow. Subclavians: Normal flow hemodynamics were seen in the right subclavian artery.  Assessment and Plan:    Current medicines were reviewed with the patient today.  No orders of the defined types were placed in this encounter.   Disposition:  Signed, Jonelle Sidle, MD, [facility] 03/20/2018 4:00 PM    [facility] [address] [place], [place], [place] [address] Phone: [phone]; Fax: [phone]

## 2018-03-21 ENCOUNTER — Ambulatory Visit: Payer: Self-pay | Admitting: Cardiology

## 2018-03-24 NOTE — Congregational Nurse Program (Signed)
Congregational Nurse Program Note  Date of Encounter: 03/23/2018  Past Medical History: Past Medical History:  Diagnosis Date  . AAA (abdominal aortic aneurysm) without rupture (HCC) 08/25/2016  . Anxiety   . Arthritis   . Bipolar 1 disorder (HCC)   . CAD (coronary artery disease)    a. 08/08/16 LAD 70%, 60% 1st diag, CTO RCA Med Rx EF 55%  . Carotid artery stenosis    Right carotid endarterectomy 07/22/05 with recurrent stenosis 01/2017; known left ICA occlusion  . Crack cocaine use   . Depression   . Headache   . Hypertension   . Myocardial infarction (HCC)   . Right Groin Hematoma    a. 07/2016 following diagnostic cath  . Stroke [facility](HCC) 2017    Encounter Details: CNP Questionnaire - 03/23/18 1032      Questionnaire   Patient Status  Not Applicable    Race  White or Caucasian    [place] [facility]  [facility]    Uninsured  Not Applicable    Food  Yes, have food insecurities    Housing/Utilities  Worried about losing housing    Transportation  No transportation needs    Interpersonal Safety  No, do not feel physically and emotionally safe where you currently live    Medication  No medication insecurities    Medical Provider  Yes    Referrals  Not Applicable    ED Visit Averted  Not Applicable    Life-Saving Intervention Made  Not Applicable      Clinical Intake - 03/09/18 1551      Pain   Pain   No/denies pain      Functional Status   Ambulation  Independent    Home Management  -- lives at home   lives at home     Request for BP check today. 184/102 P 58. Client states bp is always normal is on medication;  Is currently experiencing a lot of stress due to housing situation.  Client living with family member, insecure About future residence there.  Is seeking housing elsewhere. Informed client how stress can affect bp and to keep close check on bp and followup with pcp. Support and encouragement given to client.  Pearletha AlfredJan Jshon Ibe,RN  [phone].

## 2018-05-19 ENCOUNTER — Other Ambulatory Visit: Payer: Self-pay | Admitting: Physician Assistant

## 2018-06-07 ENCOUNTER — Ambulatory Visit: Payer: Self-pay | Admitting: Physician Assistant

## 2018-06-09 NOTE — Progress Notes (Signed)
Cardiology Office Note  Date: 06/10/2018   ID: [name], DOB [birth year], MRN [id number]  PCP: Jacquelin Hawking, PA-C  Primary Cardiologist: Nona Dell, MD   Chief Complaint  Patient presents with  . Coronary Artery Disease    History of Present Illness: [name] is a medically complex and high-risk 57 y.o. female last seen in October 2018.  She is here today for a follow-up visit.  Reports an episode of chest pain a few months ago, but no regular angina symptoms or nitroglycerin use.  She was apparently seen at [facility] and had an ECG which we will request.  She did undergo redo right carotid endarterectomy with bovine pericardial patch angioplasty in May of this year with Dr. Edilia Bo.  No obvious perioperative cardiac complications.  Her blood pressure is reasonably well controlled today.  She states she has been taking her medications regularly.  Denies any illicit substance abuse.  I went over her current cardiac medications which are listed below.  We discussed reducing aspirin to 81 mg daily.  Last lipid panel in January showed LDL of 67.  Past Medical History:  Diagnosis Date  . AAA (abdominal aortic aneurysm) without rupture (HCC) 08/25/2016  . Anxiety   . Arthritis   . Bipolar 1 disorder (HCC)   . CAD (coronary artery disease)    a. 08/08/16 LAD 70%, 60% 1st diag, CTO RCA Med Rx EF 55%  . Carotid artery stenosis    Right carotid endarterectomy 07/22/05 with recurrent stenosis 01/2017; known left ICA occlusion  . Crack cocaine use   . Depression   . Headache   . Hypertension   . Myocardial infarction (HCC)   . Right Groin Hematoma    a. 07/2016 following diagnostic cath  . Stroke [facility]) 2017    Past Surgical History:  Procedure Laterality Date  . CARDIAC CATHETERIZATION N/A 08/07/2016   Procedure: Left Heart Cath and Coronary Angiography;  Surgeon: Lennette Bihari, MD;  Location: [facility] INVASIVE CV LAB;  Service: Cardiovascular;   Laterality: N/A;  . CYST REMOVAL TRUNK Left    beneath L breast  . ENDARTERECTOMY Right 02/08/2018   Procedure: ENDARTERECTOMY CAROTID REDO;  Surgeon: Chuck Hint, MD;  Location: [facility] OR;  Service: Vascular;  Laterality: Right;  . TUBAL LIGATION    . VASCULAR SURGERY Right    right carotid endarterectomy 07/22/05     Current Outpatient Medications  Medication Sig Dispense Refill  . acetaminophen (TYLENOL) 325 MG tablet Take 650-975 mg by mouth 3 (three) times daily as needed for moderate pain or headache.    Marland Kitchen amLODipine (NORVASC) 10 MG tablet TAKE 1 Tablet BY MOUTH ONCE DAILY 90 tablet 1  . atorvastatin (LIPITOR) 80 MG tablet TAKE 1 Tablet BY MOUTH ONCE DAILY AT 6 PM 90 tablet 0  . cloNIDine (CATAPRES) 0.2 MG tablet TAKE 1 Tablet  BY MOUTH TWICE DAILY 180 tablet 1  . lisinopril (PRINIVIL,ZESTRIL) 40 MG tablet TAKE 1 Tablet BY MOUTH ONCE DAILY 90 tablet 1  . metoprolol tartrate (LOPRESSOR) 100 MG tablet TAKE 2 Tablets BY MOUTH TWICE DAILY 360 tablet 1  . NITROSTAT 0.4 MG SL tablet PLACE 1 TABLET UNDER TONGUE AS NEEDED FOR CHEST PAIN EVERY 5 MINUTES X 3 MAX DOSES.  CALL 911 IF PAIN PERSISTS 100 tablet 0  . oxyCODONE (OXY IR/ROXICODONE) 5 MG immediate release tablet Take 1 tablet (5 mg total) by mouth every 6 (six) hours as needed for moderate pain. 9  tablet 0  . PROVENTIL HFA 108 (90 Base) MCG/ACT inhaler INHALE 2 PUFFS BY MOUTH EVERY 6 HOURS AS NEEDED FOR COUGHING, WHEEZING, OR SHORTNESS OF BREATH 20.1 g 1  . aspirin EC 81 MG tablet Take 1 tablet (81 mg total) by mouth daily. 90 tablet 3   No current facility-administered medications for this visit.    Allergies:  Penicillins   Social History: The patient  reports that she has been smoking cigarettes. She has a 21.00 pack-year smoking history. She has never used smokeless tobacco. She reports that she has current or past drug history. Drugs: Marijuana and "Crack" cocaine. She reports that she does not drink alcohol.   ROS:   Please see the history of present illness. Otherwise, complete review of systems is positive for none.  All other systems are reviewed and negative.   Physical Exam: VS:  BP 132/82   Pulse (!) 51   Ht 5\' 3"  (1.6 m)   Wt 178 lb 3.2 oz (80.8 kg)   LMP 07/18/2011   SpO2 97%   BMI 31.57 kg/m , BMI Body mass index is 31.57 kg/m.  Wt Readings from Last 3 Encounters:  06/10/18 178 lb 3.2 oz (80.8 kg)  03/09/18 171 lb 6.4 oz (77.7 kg)  03/08/18 170 lb 12 oz (77.5 kg)    General: Chronically ill-appearing woman, appears comfortable at rest. HEENT: Conjunctiva and lids normal, oropharynx clear with poor dentition. Neck: Supple, no elevated JVP, bilateral carotid bruits with right CEA scar, no thyromegaly. Lungs: Clear to auscultation, nonlabored breathing at rest. Cardiac: Regular rate and rhythm, no S3, 2/6 systolic murmur. Abdomen: Soft, nontender, bowel sounds present. Extremities: No pitting edema, distal pulses 2+. Skin: Warm and dry. Musculoskeletal: No kyphosis. Neuropsychiatric: Alert and oriented x3, affect grossly appropriate.  ECG: I personally reviewed the tracing from 07/28/2017 which showed sinus bradycardia with old inferior infarct pattern and decreased R wave progression.  Recent Labwork: 02/01/2018: ALT 21; AST 22 02/09/2018: BUN 11; Creatinine, Ser 0.69; Hemoglobin 13.7; Platelets 293; Potassium 4.4; Sodium 138     Component Value Date/Time   CHOL 124 10/13/2017 0958   TRIG 101 10/13/2017 0958   HDL 37 (L) 10/13/2017 0958   CHOLHDL 3.4 10/13/2017 0958   VLDL 20 10/13/2017 0958   LDLCALC 67 10/13/2017 0958    Other Studies Reviewed Today:  Echocardiogram 08/08/2016: Study Conclusions  - Left ventricle: The cavity size was normal. There was mild concentric hypertrophy. Systolic function was normal. The estimated ejection fraction was in the range of 55% to 60%. Wall motion was normal; there were no regional wall motion abnormalities. Doppler  parameters are consistent with abnormal left ventricular relaxation (grade 1 diastolic dysfunction). - Left atrium: The atrium was mildly dilated. - Atrial septum: No defect or patent foramen ovale was identified.  Cardiac catheterization 08/07/2016:  There is mild left ventricular systolic dysfunction.  The left ventricular ejection fraction is 45-50% by visual estimate.  There is no mitral valve regurgitation.  LV end diastolic pressure is mildly elevated.  Mid RCA lesion, 100 %stenosed.  Dist RCA lesion, 75 %stenosed.  Mid LAD-2 lesion, 60 %stenosed.  Mid LAD-1 lesion, 70 %stenosed.  Dist LAD-2 lesion, 45 %stenosed.  Dist LAD-1 lesion, 50 %stenosed.  2nd Mrg lesion, 40 %stenosed.  Moderate LV dysfunction with upper mid to basal inferior hypocontractility. Global ejection fraction is 45-50%.  Multivessel CAD with calcification of the proximal LAD with 70% followed by 60% stenoses between the first and second diagonal vessel with  segmental 50% stenoses in the mid distal LAD; 40-50% diffuse narrowing in the distal circumflex marginal branch; an old total occlusion of the proximal RCA with bridging collaterals.  Assessment and Plan:  1.  Multivessel CAD as outlined above with stable angina symptoms on medical therapy.  Aspirin being reduced to 81 mg daily.  Otherwise continue Norvasc, beta-blocker, and statin therapy.  [facility]  2.  Carotid artery disease status post redo right carotid endarterectomy with bovine pericardial patch angioplasty in May with Dr. Edilia Bo.  Continue aspirin and statin.  3.  Essential hypertension, blood pressure is reasonably well controlled today.  She reports compliance with her medications.  No changes were made.  Keep follow-up with PCP.  4.  Mixed hyperlipidemia, reports compliance with Lipitor.  Last LDL was 67.  5.  Tobacco abuse.  Smoking cessation discussed.  Current medicines were  reviewed with the patient today.  Disposition: Follow-up in 6 months.  Signed, Jonelle Sidle, MD, [facility] 06/10/2018 2:23 PM    [facility] [address] [place], [place], [place] [address] Phone: [phone]; Fax: [phone]

## 2018-06-10 ENCOUNTER — Encounter: Payer: Self-pay | Admitting: Cardiology

## 2018-06-10 ENCOUNTER — Ambulatory Visit (INDEPENDENT_AMBULATORY_CARE_PROVIDER_SITE_OTHER): Payer: Self-pay | Admitting: Cardiology

## 2018-06-10 ENCOUNTER — Encounter: Payer: Self-pay | Admitting: *Deleted

## 2018-06-10 VITALS — BP 132/82 | HR 51 | Ht 63.0 in | Wt 178.2 lb

## 2018-06-10 DIAGNOSIS — E782 Mixed hyperlipidemia: Secondary | ICD-10-CM

## 2018-06-10 DIAGNOSIS — I25119 Atherosclerotic heart disease of native coronary artery with unspecified angina pectoris: Secondary | ICD-10-CM

## 2018-06-10 DIAGNOSIS — I6523 Occlusion and stenosis of bilateral carotid arteries: Secondary | ICD-10-CM

## 2018-06-10 DIAGNOSIS — Z720 Tobacco use: Secondary | ICD-10-CM

## 2018-06-10 DIAGNOSIS — I10 Essential (primary) hypertension: Secondary | ICD-10-CM

## 2018-06-10 MED ORDER — ASPIRIN EC 81 MG PO TBEC: 81.0000 mg | DELAYED_RELEASE_TABLET | Freq: Every day | ORAL | 3 refills | Status: DC

## 2018-06-10 NOTE — Patient Instructions (Addendum)
Medication Instructions:   Your physician has recommended you make the following change in your medication:   Decrease aspirin to 81 mg by mouth daily.  Continue all other medications the same.  Labwork:  NONE  Testing/Procedures:  NONE  Follow-Up:  Your physician recommends that you schedule a follow-up appointment in: 6 months. You will receive a reminder letter in the mail in about 4 months reminding you to call and schedule your appointment. If you don't receive this letter, please contact our office.   Any Other Special Instructions Will Be Listed Below (If Applicable).  If you need a refill on your cardiac medications before your next appointment, please call your pharmacy. 

## 2018-07-05 ENCOUNTER — Ambulatory Visit: Payer: Self-pay | Admitting: Physician Assistant

## 2018-07-19 ENCOUNTER — Encounter: Payer: Self-pay | Admitting: Physician Assistant

## 2018-08-09 ENCOUNTER — Encounter: Payer: Self-pay | Admitting: Physician Assistant

## 2018-08-09 ENCOUNTER — Ambulatory Visit: Payer: Self-pay | Admitting: Physician Assistant

## 2018-08-09 VITALS — BP 180/100 | HR 64 | Temp 97.7°F | Ht 63.0 in | Wt 181.0 lb

## 2018-08-09 DIAGNOSIS — I714 Abdominal aortic aneurysm, without rupture, unspecified: Secondary | ICD-10-CM

## 2018-08-09 DIAGNOSIS — I739 Peripheral vascular disease, unspecified: Secondary | ICD-10-CM

## 2018-08-09 DIAGNOSIS — F39 Unspecified mood [affective] disorder: Secondary | ICD-10-CM

## 2018-08-09 DIAGNOSIS — I10 Essential (primary) hypertension: Secondary | ICD-10-CM

## 2018-08-09 DIAGNOSIS — F17218 Nicotine dependence, cigarettes, with other nicotine-induced disorders: Secondary | ICD-10-CM

## 2018-08-09 DIAGNOSIS — E785 Hyperlipidemia, unspecified: Secondary | ICD-10-CM

## 2018-08-09 DIAGNOSIS — I779 Disorder of arteries and arterioles, unspecified: Secondary | ICD-10-CM

## 2018-08-09 NOTE — Progress Notes (Signed)
BP (!) 180/100   Pulse 64   Temp 97.7 F (36.5 C)   Ht 5\' 3"  (1.6 m)   Wt 181 lb (82.1 kg)   LMP 07/18/2011   SpO2 99%   BMI 32.06 kg/m    Subjective:    Patient ID: [name], female    DOB: Jun 09, 1961, 58 y.o.   MRN: [id number]  HPI: [name] is a 57 y.o. female presenting on 08/09/2018 for Follow-up   HPI  Pt is in today for recheck of blood pressure and other medication issues.    Pt says she is about to be evicted .  Her granddaughter just had a baby in prison.  She says her mood is shot.  Her roommate left so that is less money coming in.  She says she is Very stressed.  She says she is not using using cocaine but she is smoking weed.  She  Couldn't accept a call from her incarcerated granddaughter because she didn't have a credit card.  That upset her as well.   Pt tearful.    Pt denies HA today.  States no CP or vision changes.  Pt denies SI, HI.   Pt has still not gotten labs done that she was supposed to have done in June.    Relevant past medical, surgical, family and social history reviewed and updated as indicated. Interim medical history since our last visit reviewed. Allergies and medications reviewed and updated.   Current Outpatient Medications:  .  acetaminophen (TYLENOL) 325 MG tablet, Take 650-975 mg by mouth 3 (three) times daily as needed for moderate pain or headache., Disp: , Rfl:  .  amLODipine (NORVASC) 10 MG tablet, TAKE 1 Tablet BY MOUTH ONCE DAILY, Disp: 90 tablet, Rfl: 1 .  aspirin EC 81 MG tablet, Take 1 tablet (81 mg total) by mouth daily., Disp: 90 tablet, Rfl: 3 .  atorvastatin (LIPITOR) 80 MG tablet, TAKE 1 Tablet BY MOUTH ONCE DAILY AT 6 PM, Disp: 90 tablet, Rfl: 0 .  cloNIDine (CATAPRES) 0.2 MG tablet, TAKE 1 Tablet  BY MOUTH TWICE DAILY, Disp: 180 tablet, Rfl: 1 .  lisinopril (PRINIVIL,ZESTRIL) 40 MG tablet, TAKE 1 Tablet BY MOUTH ONCE DAILY, Disp: 90 tablet, Rfl: 1 .  metoprolol tartrate (LOPRESSOR) 100 MG tablet, TAKE 2  Tablets BY MOUTH TWICE DAILY, Disp: 360 tablet, Rfl: 1 .  NITROSTAT 0.4 MG SL tablet, PLACE 1 TABLET UNDER TONGUE AS NEEDED FOR CHEST PAIN EVERY 5 MINUTES X 3 MAX DOSES.  CALL 911 IF PAIN PERSISTS, Disp: 100 tablet, Rfl: 0 .  PROVENTIL HFA 108 (90 Base) MCG/ACT inhaler, INHALE 2 PUFFS BY MOUTH EVERY 6 HOURS AS NEEDED FOR COUGHING, WHEEZING, OR SHORTNESS OF BREATH, Disp: 20.1 g, Rfl: 1 .  oxyCODONE (OXY IR/ROXICODONE) 5 MG immediate release tablet, Take 1 tablet (5 mg total) by mouth every 6 (six) hours as needed for moderate pain. (Patient not taking: Reported on 08/09/2018), Disp: 9 tablet, Rfl: 0  Review of Systems  Constitutional: Negative for appetite change, chills, diaphoresis, fatigue, fever and unexpected weight change.  HENT: Positive for congestion. Negative for dental problem, drooling, ear pain, facial swelling, hearing loss, mouth sores, sneezing, sore throat, trouble swallowing and voice change.   Eyes: Negative for pain, discharge, redness, itching and visual disturbance.  Respiratory: Positive for cough and wheezing. Negative for choking and shortness of breath.   Cardiovascular: Negative for chest pain, palpitations and leg swelling.  Gastrointestinal: Positive for constipation. Negative for  abdominal pain, blood in stool, diarrhea and vomiting.  Endocrine: Negative for cold intolerance, heat intolerance and polydipsia.  Genitourinary: Negative for decreased urine volume, dysuria and hematuria.  Musculoskeletal: Positive for arthralgias and back pain. Negative for gait problem.  Skin: Negative for rash.  Allergic/Immunologic: Negative for environmental allergies.  Neurological: Positive for headaches. Negative for seizures, syncope and light-headedness.  Hematological: Negative for adenopathy.  Psychiatric/Behavioral: Positive for dysphoric mood. Negative for agitation and suicidal ideas. The patient is nervous/anxious.     Per HPI unless specifically indicated above      Objective:    BP (!) 180/100   Pulse 64   Temp 97.7 F (36.5 C)   Ht 5\' 3"  (1.6 m)   Wt 181 lb (82.1 kg)   LMP 07/18/2011   SpO2 99%   BMI 32.06 kg/m   Wt Readings from Last 3 Encounters:  08/09/18 181 lb (82.1 kg)  06/10/18 178 lb 3.2 oz (80.8 kg)  03/09/18 171 lb 6.4 oz (77.7 kg)    Physical Exam  Constitutional: She is oriented to person, place, and time. She appears well-developed and well-nourished.  HENT:  Head: Normocephalic and atraumatic.  Neck: Neck supple.  Cardiovascular: Normal rate and regular rhythm.  Pulmonary/Chest: Effort normal and breath sounds normal.  Abdominal: Soft. Bowel sounds are normal. She exhibits no mass. There is no hepatosplenomegaly. There is no tenderness.  Musculoskeletal: She exhibits no edema.  Lymphadenopathy:    She has no cervical adenopathy.  Neurological: She is alert and oriented to person, place, and time.  Skin: Skin is warm and dry.  Psychiatric: Her behavior is normal. Thought content normal. She exhibits a depressed mood.  Vitals reviewed.       Assessment & Plan:    Encounter Diagnoses  Name Primary?  . Essential hypertension Yes  . Dyslipidemia   . Bilateral carotid artery disease, unspecified type (HCC)   . Cigarette nicotine dependence with other nicotine-induced disorder   . AAA (abdominal aortic aneurysm) without rupture (HCC)   . Mood disorder (HCC)     -pt to get fasting Labs drawn this week -pt urged to get to [facility] for cousneling.  Discussed with pt that she must stop worrying about things so much due to elevations in BP and increased risk for stroke.  She says she knows that. -Will have patricia contact pt to assist with housing options -pt to continue current medications -pt to follow up 1 month.  RTO sooner prn

## 2018-08-11 ENCOUNTER — Other Ambulatory Visit (HOSPITAL_COMMUNITY)
Admission: RE | Admit: 2018-08-11 | Discharge: 2018-08-11 | Disposition: A | Discharge status: Home / Self Care | Payer: Self-pay | Source: Ambulatory Visit | Attending: Physician Assistant | Admitting: Physician Assistant

## 2018-08-11 DIAGNOSIS — I779 Disorder of arteries and arterioles, unspecified: Secondary | ICD-10-CM

## 2018-08-11 DIAGNOSIS — I739 Peripheral vascular disease, unspecified: Secondary | ICD-10-CM | POA: Insufficient documentation

## 2018-08-11 DIAGNOSIS — I10 Essential (primary) hypertension: Secondary | ICD-10-CM | POA: Insufficient documentation

## 2018-08-11 DIAGNOSIS — E785 Hyperlipidemia, unspecified: Secondary | ICD-10-CM | POA: Insufficient documentation

## 2018-08-11 DIAGNOSIS — I714 Abdominal aortic aneurysm, without rupture, unspecified: Secondary | ICD-10-CM

## 2018-08-11 LAB — CBC
HEMATOCRIT: 46.8 % — AB (ref 36.0–46.0)
Hemoglobin: 14.9 g/dL (ref 12.0–15.0)
MCH: 29.7 pg (ref 26.0–34.0)
MCHC: 31.8 g/dL (ref 30.0–36.0)
MCV: 93.4 fL (ref 80.0–100.0)
PLATELETS: 311 10*3/uL (ref 150–400)
RBC: 5.01 MIL/uL (ref 3.87–5.11)
RDW: 12.7 % (ref 11.5–15.5)
WBC: 7.8 10*3/uL (ref 4.0–10.5)
nRBC: 0 % (ref 0.0–0.2)

## 2018-08-11 LAB — COMPREHENSIVE METABOLIC PANEL
ALK PHOS: 86 U/L (ref 38–126)
ALT: 15 U/L (ref 0–44)
AST: 20 U/L (ref 15–41)
Albumin: 3.7 g/dL (ref 3.5–5.0)
Anion gap: 7 (ref 5–15)
BILIRUBIN TOTAL: 0.7 mg/dL (ref 0.3–1.2)
BUN: 13 mg/dL (ref 6–20)
CALCIUM: 9.1 mg/dL (ref 8.9–10.3)
CO2: 25 mmol/L (ref 22–32)
Chloride: 107 mmol/L (ref 98–111)
Creatinine, Ser: 0.82 mg/dL (ref 0.44–1.00)
GFR calc Af Amer: >60 mL/min (ref >60)
GFR calc non Af Amer: >60 mL/min (ref >60)
Glucose, Bld: 103 mg/dL — ABNORMAL HIGH (ref 70–99)
Potassium: 4.6 mmol/L (ref 3.5–5.1)
Sodium: 139 mmol/L (ref 135–145)
TOTAL PROTEIN: 7.2 g/dL (ref 6.5–8.1)

## 2018-08-11 LAB — LIPID PANEL
CHOLESTEROL: 233 mg/dL — AB (ref 0–200)
HDL: 39 mg/dL — AB (ref >40)
LDL CALC: 151 mg/dL — AB (ref 0–99)
TRIGLYCERIDES: 213 mg/dL — AB (ref <150)
Total CHOL/HDL Ratio: 6 RATIO
VLDL: 43 mg/dL — ABNORMAL HIGH (ref 0–40)

## 2018-08-15 ENCOUNTER — Telehealth: Payer: Self-pay

## 2018-08-15 NOTE — Telephone Encounter (Signed)
Called to follow up with client for CM at the Request of the provider of the [facility] or [place]. Client is currently unable to talk at this time and she will call RN back when she can talk freely. Asked to follow up with client regarding housing situation.   [name] NodalPatricia [name] [name] Richman Rn [phone]

## 2018-08-18 ENCOUNTER — Other Ambulatory Visit: Payer: Self-pay | Admitting: Physician Assistant

## 2018-09-06 ENCOUNTER — Encounter: Payer: Self-pay | Admitting: Physician Assistant

## 2018-09-06 ENCOUNTER — Ambulatory Visit: Payer: Self-pay | Admitting: Physician Assistant

## 2018-09-06 VITALS — BP 152/98 | HR 74 | Temp 97.3°F | Ht 63.0 in | Wt 179.0 lb

## 2018-09-06 DIAGNOSIS — I10 Essential (primary) hypertension: Secondary | ICD-10-CM

## 2018-09-06 DIAGNOSIS — M25562 Pain in left knee: Principal | ICD-10-CM

## 2018-09-06 DIAGNOSIS — E785 Hyperlipidemia, unspecified: Secondary | ICD-10-CM

## 2018-09-06 DIAGNOSIS — M79605 Pain in left leg: Secondary | ICD-10-CM

## 2018-09-06 DIAGNOSIS — G8929 Other chronic pain: Secondary | ICD-10-CM

## 2018-09-06 DIAGNOSIS — F17200 Nicotine dependence, unspecified, uncomplicated: Secondary | ICD-10-CM

## 2018-09-06 NOTE — Progress Notes (Signed)
BP (!) 152/98 (BP Location: Right Arm, Patient Position: Sitting, Cuff Size: Normal)   Pulse 74   Temp (!) 97.3 F (36.3 C)   Ht 5\' 3"  (1.6 m)   Wt 179 lb (81.2 kg)   LMP 07/18/2011   SpO2 99%   BMI 31.71 kg/m    Subjective:    Patient ID: [name], female    DOB: 1961-01-30, 57 y.o.   MRN: [id number]  HPI: [name] is a 57 y.o. female presenting on 09/06/2018 for Hypertension   HPI   Pt is still smoking but she says she is not using cocaine.  Pt complains of L leg swelling and pain.  This is intermittent for past several weeks but is getting worse.    Relevant past medical, surgical, family and social history reviewed and updated as indicated. Interim medical history since our last visit reviewed. Allergies and medications reviewed and updated.   Current Outpatient Medications:  .  acetaminophen (TYLENOL) 325 MG tablet, Take 650-975 mg by mouth 3 (three) times daily as needed for moderate pain or headache., Disp: , Rfl:  .  amLODipine (NORVASC) 10 MG tablet, TAKE 1 Tablet BY MOUTH ONCE DAILY, Disp: 90 tablet, Rfl: 1 .  aspirin EC 81 MG tablet, Take 1 tablet (81 mg total) by mouth daily., Disp: 90 tablet, Rfl: 3 .  atorvastatin (LIPITOR) 80 MG tablet, TAKE 1 Tablet BY MOUTH ONCE DAILY AT 6 PM, Disp: 90 tablet, Rfl: 0 .  cloNIDine (CATAPRES) 0.2 MG tablet, TAKE 1 Tablet  BY MOUTH TWICE DAILY, Disp: 180 tablet, Rfl: 1 .  lisinopril (PRINIVIL,ZESTRIL) 40 MG tablet, TAKE 1 Tablet BY MOUTH ONCE DAILY, Disp: 90 tablet, Rfl: 1 .  metoprolol tartrate (LOPRESSOR) 100 MG tablet, TAKE 2 Tablets BY MOUTH TWICE DAILY, Disp: 360 tablet, Rfl: 1 .  NITROSTAT 0.4 MG SL tablet, PLACE 1 TABLET UNDER TONGUE AS NEEDED FOR CHEST PAIN EVERY 5 MINUTES X 3 MAX DOSES.  CALL 911 IF PAIN PERSISTS, Disp: 100 tablet, Rfl: 0 .  PROVENTIL HFA 108 (90 Base) MCG/ACT inhaler, INHALE 2 PUFFS BY MOUTH EVERY 6 HOURS AS NEEDED FOR COUGHING, WHEEZING, OR SHORTNESS OF BREATH, Disp: 20.1 g, Rfl:  1   Review of Systems  Constitutional: Positive for unexpected weight change. Negative for appetite change, chills, diaphoresis, fatigue and fever.  HENT: Positive for congestion. Negative for dental problem, drooling, ear pain, facial swelling, hearing loss, mouth sores, sneezing, sore throat, trouble swallowing and voice change.   Eyes: Negative for pain, discharge, redness, itching and visual disturbance.  Respiratory: Positive for cough, shortness of breath and wheezing. Negative for choking.   Cardiovascular: Positive for leg swelling. Negative for chest pain and palpitations.  Gastrointestinal: Positive for constipation. Negative for abdominal pain, blood in stool, diarrhea and vomiting.  Endocrine: Negative for cold intolerance, heat intolerance and polydipsia.  Genitourinary: Negative for decreased urine volume, dysuria and hematuria.  Musculoskeletal: Positive for arthralgias and gait problem. Negative for back pain.  Skin: Negative for rash.  Allergic/Immunologic: Negative for environmental allergies.  Neurological: Negative for seizures, syncope, light-headedness and headaches.  Hematological: Negative for adenopathy.  Psychiatric/Behavioral: Positive for dysphoric mood. Negative for agitation and suicidal ideas. The patient is nervous/anxious.     Per HPI unless specifically indicated above     Objective:    BP (!) 152/98 (BP Location: Right Arm, Patient Position: Sitting, Cuff Size: Normal)   Pulse 74   Temp (!) 97.3 F (36.3 C)   Ht  5\' 3"  (1.6 m)   Wt 179 lb (81.2 kg)   LMP 07/18/2011   SpO2 99%   BMI 31.71 kg/m   Wt Readings from Last 3 Encounters:  09/06/18 179 lb (81.2 kg)  08/09/18 181 lb (82.1 kg)  06/10/18 178 lb 3.2 oz (80.8 kg)    Physical Exam Vitals signs reviewed.  Constitutional:      Appearance: She is well-developed.  HENT:     Head: Normocephalic and atraumatic.  Neck:     Musculoskeletal: Neck supple.  Cardiovascular:     Rate and Rhythm:  Normal rate and regular rhythm.     Pulses:          Dorsalis pedis pulses are 1+ on the right side and 1+ on the left side.     Comments: No LE edema Pulmonary:     Effort: Pulmonary effort is normal.     Breath sounds: Normal breath sounds.  Abdominal:     General: Bowel sounds are normal.     Palpations: Abdomen is soft. There is no mass.     Tenderness: There is no abdominal tenderness.  Musculoskeletal:     Left knee: She exhibits swelling. Tenderness found.  Lymphadenopathy:     Cervical: No cervical adenopathy.  Skin:    General: Skin is warm and dry.  Neurological:     Mental Status: She is alert and oriented to person, place, and time.  Psychiatric:        Behavior: Behavior normal.     Results for orders placed or performed during the hospital encounter of 08/11/18  Lipid panel  Result Value Ref Range   Cholesterol 233 (H) 0 - 200 mg/dL   Triglycerides 161 (H) <150 mg/dL   HDL 39 (L) >09 mg/dL   Total CHOL/HDL Ratio 6.0 RATIO   VLDL 43 (H) 0 - 40 mg/dL   LDL Cholesterol 604 (H) 0 - 99 mg/dL  Comprehensive metabolic panel  Result Value Ref Range   Sodium 139 135 - 145 mmol/L   Potassium 4.6 3.5 - 5.1 mmol/L   Chloride 107 98 - 111 mmol/L   CO2 25 22 - 32 mmol/L   Glucose, Bld 103 (H) 70 - 99 mg/dL   BUN 13 6 - 20 mg/dL   Creatinine, Ser 5.40 0.44 - 1.00 mg/dL   Calcium 9.1 8.9 - 98.1 mg/dL   Total Protein 7.2 6.5 - 8.1 g/dL   Albumin 3.7 3.5 - 5.0 g/dL   AST 20 15 - 41 U/L   ALT 15 0 - 44 U/L   Alkaline Phosphatase 86 38 - 126 U/L   Total Bilirubin 0.7 0.3 - 1.2 mg/dL   GFR calc non Af Amer >60 >60 mL/min   GFR calc Af Amer >60 >60 mL/min   Anion gap 7 5 - 15  CBC  Result Value Ref Range   WBC 7.8 4.0 - 10.5 K/uL   RBC 5.01 3.87 - 5.11 MIL/uL   Hemoglobin 14.9 12.0 - 15.0 g/dL   HCT 19.1 (H) 47.8 - 29.5 %   MCV 93.4 80.0 - 100.0 fL   MCH 29.7 26.0 - 34.0 pg   MCHC 31.8 30.0 - 36.0 g/dL   RDW 62.1 30.8 - 65.7 %   Platelets 311 150 - 400 K/uL    nRBC 0.0 0.0 - 0.2 %      Assessment & Plan:    Encounter Diagnoses  Name Primary?  . Chronic pain of left knee Yes  .  Left leg pain   . Essential hypertension   . Dyslipidemia   . Tobacco use disorder     -reviewed labs with pt -will order Xray L knee -will order ABI -pt is given [facility] application -counseled pt on Low fat diet and smoking cessation.  -Congratulated pt on staying off the cocaine and encouraged her to continue to abstain -pt to follow up 1 month.  RTO sooner prn worsening or new symptoms

## 2018-09-07 ENCOUNTER — Ambulatory Visit: Payer: Self-pay | Admitting: Vascular Surgery

## 2018-09-07 ENCOUNTER — Inpatient Hospital Stay (HOSPITAL_COMMUNITY): Admission: RE | Admit: 2018-09-07 | Payer: Self-pay | Source: Ambulatory Visit

## 2018-09-13 ENCOUNTER — Ambulatory Visit (HOSPITAL_COMMUNITY): Admission: RE | Admit: 2018-09-13 | Payer: Self-pay | Source: Ambulatory Visit

## 2018-10-05 ENCOUNTER — Encounter (HOSPITAL_COMMUNITY): Payer: Self-pay

## 2018-10-05 ENCOUNTER — Ambulatory Visit: Payer: Self-pay | Admitting: Vascular Surgery

## 2018-11-08 ENCOUNTER — Encounter: Payer: Self-pay | Admitting: Physician Assistant

## 2018-11-08 ENCOUNTER — Ambulatory Visit: Payer: Self-pay | Admitting: Physician Assistant

## 2018-11-08 VITALS — BP 120/84 | HR 57 | Temp 97.5°F | Ht 63.0 in | Wt 182.5 lb

## 2018-11-08 DIAGNOSIS — I10 Essential (primary) hypertension: Secondary | ICD-10-CM

## 2018-11-08 DIAGNOSIS — F17200 Nicotine dependence, unspecified, uncomplicated: Secondary | ICD-10-CM

## 2018-11-08 DIAGNOSIS — I714 Abdominal aortic aneurysm, without rupture, unspecified: Secondary | ICD-10-CM

## 2018-11-08 DIAGNOSIS — E785 Hyperlipidemia, unspecified: Secondary | ICD-10-CM

## 2018-11-08 MED ORDER — ATORVASTATIN CALCIUM 80 MG PO TABS: 80.0000 mg | ORAL_TABLET | Freq: Every day | ORAL | 1 refills | Status: DC

## 2018-11-08 MED ORDER — LISINOPRIL 40 MG PO TABS: 40.0000 mg | ORAL_TABLET | Freq: Every day | ORAL | 1 refills | Status: DC

## 2018-11-08 MED ORDER — METOPROLOL TARTRATE 100 MG PO TABS: 200.0000 mg | ORAL_TABLET | Freq: Two times a day (BID) | ORAL | 1 refills | Status: DC

## 2018-11-08 MED ORDER — AMLODIPINE BESYLATE 10 MG PO TABS: 10.0000 mg | ORAL_TABLET | Freq: Every day | ORAL | 1 refills | Status: DC

## 2018-11-08 MED ORDER — ALBUTEROL SULFATE HFA 108 (90 BASE) MCG/ACT IN AERS: INHALATION_SPRAY | RESPIRATORY_TRACT | 1 refills | Status: DC

## 2018-11-08 MED ORDER — CLONIDINE HCL 0.2 MG PO TABS: 0.2000 mg | ORAL_TABLET | Freq: Two times a day (BID) | ORAL | 1 refills | Status: DC

## 2018-11-08 NOTE — Progress Notes (Signed)
BP 120/84   Pulse (!) 57   Temp (!) 97.5 F (36.4 C)   Ht 5\' 3"  (1.6 m)   Wt 182 lb 8 oz (82.8 kg)   LMP 07/18/2011   SpO2 99%   BMI 32.33 kg/m    Subjective:    Patient ID: [name], female    DOB: [birth year], 58 y.o.   MRN: [id number]  HPI: [name] is a 58 y.o. female presenting on 11/08/2018 for Follow-up   HPI   Pt is feeling well today.  She is avoiding cocaine.  She is still smoking.   Relevant past medical, surgical, family and social history reviewed and updated as indicated. Interim medical history since our last visit reviewed. Allergies and medications reviewed and updated.   Current Outpatient Medications:  .  acetaminophen (TYLENOL) 325 MG tablet, Take 650-975 mg by mouth 3 (three) times daily as needed for moderate pain or headache., Disp: , Rfl:  .  amLODipine (NORVASC) 10 MG tablet, TAKE 1 Tablet BY MOUTH ONCE DAILY, Disp: 90 tablet, Rfl: 1 .  aspirin EC 81 MG tablet, Take 1 tablet (81 mg total) by mouth daily., Disp: 90 tablet, Rfl: 3 .  atorvastatin (LIPITOR) 80 MG tablet, TAKE 1 Tablet BY MOUTH ONCE DAILY AT 6 PM, Disp: 90 tablet, Rfl: 0 .  cloNIDine (CATAPRES) 0.2 MG tablet, TAKE 1 Tablet  BY MOUTH TWICE DAILY, Disp: 180 tablet, Rfl: 1 .  lisinopril (PRINIVIL,ZESTRIL) 40 MG tablet, TAKE 1 Tablet BY MOUTH ONCE DAILY, Disp: 90 tablet, Rfl: 1 .  metoprolol tartrate (LOPRESSOR) 100 MG tablet, TAKE 2 Tablets BY MOUTH TWICE DAILY, Disp: 360 tablet, Rfl: 1 .  NITROSTAT 0.4 MG SL tablet, PLACE 1 TABLET UNDER TONGUE AS NEEDED FOR CHEST PAIN EVERY 5 MINUTES X 3 MAX DOSES.  CALL 911 IF PAIN PERSISTS, Disp: 100 tablet, Rfl: 0 .  PROVENTIL HFA 108 (90 Base) MCG/ACT inhaler, INHALE 2 PUFFS BY MOUTH EVERY 6 HOURS AS NEEDED FOR COUGHING, WHEEZING, OR SHORTNESS OF BREATH, Disp: 20.1 g, Rfl: 1    Review of Systems  Constitutional: Negative for appetite change, chills, diaphoresis, fatigue, fever and unexpected weight change.  HENT: Negative for congestion,  dental problem, drooling, ear pain, facial swelling, hearing loss, mouth sores, sneezing, sore throat, trouble swallowing and voice change.   Eyes: Negative for pain, discharge, redness, itching and visual disturbance.  Respiratory: Negative for cough, choking, shortness of breath and wheezing.   Cardiovascular: Negative for chest pain, palpitations and leg swelling.  Gastrointestinal: Negative for abdominal pain, blood in stool, constipation, diarrhea and vomiting.  Endocrine: Negative for cold intolerance, heat intolerance and polydipsia.  Genitourinary: Negative for decreased urine volume, dysuria and hematuria.  Musculoskeletal: Negative for arthralgias, back pain and gait problem.  Skin: Negative for rash.  Allergic/Immunologic: Negative for environmental allergies.  Neurological: Positive for headaches. Negative for seizures, syncope and light-headedness.  Hematological: Negative for adenopathy.  Psychiatric/Behavioral: Positive for dysphoric mood. Negative for agitation and suicidal ideas. The patient is nervous/anxious.     Per HPI unless specifically indicated above     Objective:    BP 120/84   Pulse (!) 57   Temp (!) 97.5 F (36.4 C)   Ht 5\' 3"  (1.6 m)   Wt 182 lb 8 oz (82.8 kg)   LMP 07/18/2011   SpO2 99%   BMI 32.33 kg/m   Wt Readings from Last 3 Encounters:  11/08/18 182 lb 8 oz (82.8 kg)  09/06/18 179 lb (  81.2 kg)  08/09/18 181 lb (82.1 kg)    Physical Exam Vitals signs reviewed.  Constitutional:      Appearance: She is well-developed.  HENT:     Head: Normocephalic and atraumatic.  Neck:     Musculoskeletal: Neck supple.  Cardiovascular:     Rate and Rhythm: Normal rate and regular rhythm.  Pulmonary:     Effort: Pulmonary effort is normal.     Breath sounds: Normal breath sounds.  Abdominal:     General: Bowel sounds are normal.     Palpations: Abdomen is soft. There is no mass.     Tenderness: There is no abdominal tenderness.  Lymphadenopathy:      Cervical: No cervical adenopathy.  Skin:    General: Skin is warm and dry.  Neurological:     Mental Status: She is alert and oriented to person, place, and time.  Psychiatric:        Behavior: Behavior normal.         Assessment & Plan:   Encounter Diagnoses  Name Primary?  . Essential hypertension Yes  . Dyslipidemia   . Tobacco use disorder   . AAA (abdominal aortic aneurysm) without rupture (HCC)     -Check labs- lipids, cmp -Pt due for annual update of AAA ultrasound -pt counseled on smoking cessation -no medication changes today. -pt to follow up 3 months.  RTO sooner prn

## 2018-11-15 ENCOUNTER — Other Ambulatory Visit: Payer: Self-pay | Admitting: Physician Assistant

## 2018-11-15 DIAGNOSIS — I714 Abdominal aortic aneurysm, without rupture, unspecified: Secondary | ICD-10-CM

## 2018-11-23 ENCOUNTER — Ambulatory Visit: Payer: Self-pay | Admitting: Vascular Surgery

## 2018-11-23 ENCOUNTER — Inpatient Hospital Stay (HOSPITAL_COMMUNITY): Admission: RE | Admit: 2018-11-23 | Payer: Self-pay | Source: Ambulatory Visit

## 2018-11-24 ENCOUNTER — Other Ambulatory Visit: Payer: Self-pay

## 2018-11-24 DIAGNOSIS — I6523 Occlusion and stenosis of bilateral carotid arteries: Secondary | ICD-10-CM

## 2018-12-05 ENCOUNTER — Encounter: Payer: Self-pay | Admitting: Vascular Surgery

## 2018-12-05 ENCOUNTER — Other Ambulatory Visit: Payer: Self-pay | Admitting: Physician Assistant

## 2018-12-05 ENCOUNTER — Ambulatory Visit (HOSPITAL_COMMUNITY)
Admission: RE | Admit: 2018-12-05 | Discharge: 2018-12-05 | Disposition: A | Discharge status: Home / Self Care | Payer: Self-pay | Source: Ambulatory Visit | Attending: Physician Assistant | Admitting: Physician Assistant

## 2018-12-05 DIAGNOSIS — I6523 Occlusion and stenosis of bilateral carotid arteries: Secondary | ICD-10-CM | POA: Insufficient documentation

## 2018-12-05 MED ORDER — NITROGLYCERIN 0.4 MG SL SUBL: SUBLINGUAL_TABLET | SUBLINGUAL | 3 refills | Status: DC

## 2018-12-13 ENCOUNTER — Ambulatory Visit (HOSPITAL_COMMUNITY): Payer: Self-pay

## 2018-12-26 ENCOUNTER — Ambulatory Visit: Payer: Self-pay | Admitting: Cardiology

## 2019-02-07 ENCOUNTER — Ambulatory Visit: Payer: Self-pay | Admitting: Physician Assistant

## 2019-02-07 ENCOUNTER — Encounter: Payer: Self-pay | Admitting: Physician Assistant

## 2019-02-07 DIAGNOSIS — I714 Abdominal aortic aneurysm, without rupture, unspecified: Secondary | ICD-10-CM

## 2019-02-07 DIAGNOSIS — F17200 Nicotine dependence, unspecified, uncomplicated: Secondary | ICD-10-CM

## 2019-02-07 DIAGNOSIS — I10 Essential (primary) hypertension: Secondary | ICD-10-CM

## 2019-02-07 DIAGNOSIS — I779 Disorder of arteries and arterioles, unspecified: Secondary | ICD-10-CM

## 2019-02-07 DIAGNOSIS — E785 Hyperlipidemia, unspecified: Secondary | ICD-10-CM

## 2019-02-07 MED ORDER — LISINOPRIL 40 MG PO TABS: 40.0000 mg | ORAL_TABLET | Freq: Every day | ORAL | 1 refills | Status: DC

## 2019-02-07 MED ORDER — ATORVASTATIN CALCIUM 80 MG PO TABS: 80.0000 mg | ORAL_TABLET | Freq: Every day | ORAL | 1 refills | Status: DC

## 2019-02-07 MED ORDER — ALBUTEROL SULFATE HFA 108 (90 BASE) MCG/ACT IN AERS: INHALATION_SPRAY | RESPIRATORY_TRACT | 1 refills | Status: DC

## 2019-02-07 MED ORDER — CLONIDINE HCL 0.2 MG PO TABS: 0.2000 mg | ORAL_TABLET | Freq: Two times a day (BID) | ORAL | 1 refills | Status: DC

## 2019-02-07 MED ORDER — AMLODIPINE BESYLATE 10 MG PO TABS: 10.0000 mg | ORAL_TABLET | Freq: Every day | ORAL | 1 refills | Status: DC

## 2019-02-07 MED ORDER — METOPROLOL TARTRATE 100 MG PO TABS: 200.0000 mg | ORAL_TABLET | Freq: Two times a day (BID) | ORAL | 1 refills | Status: DC

## 2019-02-07 MED ORDER — NITROGLYCERIN 0.4 MG SL SUBL: SUBLINGUAL_TABLET | SUBLINGUAL | 3 refills | Status: DC

## 2019-02-07 NOTE — Progress Notes (Signed)
LMP 07/18/2011    Subjective:    Patient ID: [name], female    DOB: 01/22/1961, 58 y.o.   MRN: [id number]  HPI: [name] LoftyDebra G Andel is a 58 y.o. female presenting on 02/07/2019 for No chief complaint on file.   HPI    This is a telemedicine visit due to coronavirus pandemic.  It is via Telephone as pt was unable to get her video to connect.    I connected with  [name] Loftyebra G Kinslow on 02/07/19 by a video enabled telemedicine application and verified that I am speaking with the correct person using two identifiers.   I discussed the limitations of evaluation and management by telemedicine. The patient expressed understanding and agreed to proceed.   Pt says she is doing pretty well.     Pt is still off cocaine.  She is still smoking  She did not get labs drawn after february OV as instructed  She says her Breathing is "pretty good"  She states Some depression related to virus.  Her son lives with her (he is 2528).  He is disabled.   Pt denies SI, HI.     Relevant past medical, surgical, family and social history reviewed and updated as indicated. Interim medical history since our last visit reviewed. Allergies and medications reviewed and updated.    Current Outpatient Medications:  .  albuterol (PROVENTIL HFA) 108 (90 Base) MCG/ACT inhaler, INHALE 2 PUFFS BY MOUTH EVERY 6 HOURS AS NEEDED FOR COUGHING, WHEEZING, OR SHORTNESS OF BREATH, Disp: 20.1 Inhaler, Rfl: 1 .  amLODipine (NORVASC) 10 MG tablet, Take 1 tablet (10 mg total) by mouth daily., Disp: 90 tablet, Rfl: 1 .  aspirin EC 81 MG tablet, Take 1 tablet (81 mg total) by mouth daily., Disp: 90 tablet, Rfl: 3 .  atorvastatin (LIPITOR) 80 MG tablet, Take 1 tablet (80 mg total) by mouth daily at 6 PM., Disp: 90 tablet, Rfl: 1 .  cloNIDine (CATAPRES) 0.2 MG tablet, Take 1 tablet (0.2 mg total) by mouth 2 (two) times daily., Disp: 180 tablet, Rfl: 1 .  lisinopril (PRINIVIL,ZESTRIL) 40 MG tablet, Take 1 tablet (40 mg total)  by mouth daily., Disp: 90 tablet, Rfl: 1 .  metoprolol tartrate (LOPRESSOR) 100 MG tablet, Take 2 tablets (200 mg total) by mouth 2 (two) times daily., Disp: 360 tablet, Rfl: 1 .  acetaminophen (TYLENOL) 325 MG tablet, Take 650-975 mg by mouth 3 (three) times daily as needed for moderate pain or headache., Disp: , Rfl:  .  nitroGLYCERIN (NITROSTAT) 0.4 MG SL tablet, PLACE 1 TABLET UNDER TONGUE AS NEEDED FOR CHEST PAIN EVERY 5 MINUTES X 3 MAX DOSES.  CALL 911 IF PAIN PERSISTS (Patient not taking: Reported on 02/07/2019), Disp: 25 tablet, Rfl: 3   Review of Systems  Per HPI unless specifically indicated above     Objective:    LMP 07/18/2011   Wt Readings from Last 3 Encounters:  11/08/18 182 lb 8 oz (82.8 kg)  09/06/18 179 lb (81.2 kg)  08/09/18 181 lb (82.1 kg)    Physical Exam Pulmonary:     Effort: Pulmonary effort is normal. No respiratory distress.  Neurological:     Mental Status: She is alert and oriented to person, place, and time.  Psychiatric:        Mood and Affect: Mood normal.        Speech: Speech normal.        Behavior: Behavior is cooperative.  Assessment & Plan:    Encounter Diagnoses  Name Primary?  . Essential hypertension Yes  . Tobacco use disorder   . Dyslipidemia   . Bilateral carotid artery disease, unspecified type (HCC)   . AAA (abdominal aortic aneurysm) without rupture (HCC)     -Refill sent for all meds -Defer labs for now -congratulated pt on staying off cocaine -will check on [place] ordered in February to recheck AAA -pt will follow up in 3 months.  She will contact office sooner prn

## 2019-03-13 ENCOUNTER — Telehealth: Payer: Self-pay | Admitting: Cardiology

## 2019-03-13 NOTE — Telephone Encounter (Signed)

## 2019-03-14 ENCOUNTER — Ambulatory Visit: Payer: Self-pay | Admitting: Cardiology

## 2019-03-14 NOTE — Progress Notes (Deleted)
Cardiology Office Note  Date: 03/14/2019   ID: [name], DOB 09-Oct-1960, MRN [id number]  PCP:  Soyla Dryer, PA-C  Cardiologist:  Rozann Lesches, MD Electrophysiologist:  None   No chief complaint on file.   History of Present Illness: [name] is a medically complex 58 y.o. female last seen in September 2019.   Carotid Dopplers from March are outlined below.  Past Medical History:  Diagnosis Date  . AAA (abdominal aortic aneurysm) without rupture ([place]) 08/25/2016  . Anxiety   . Arthritis   . Bipolar 1 disorder ([place])   . CAD (coronary artery disease)    a. 08/08/16 LAD 70%, 60% 1st diag, CTO RCA Med Rx EF 55%  . Carotid artery stenosis    Right carotid endarterectomy 07/22/05 with recurrent stenosis 01/2017; known left ICA occlusion  . Crack cocaine use   . Depression   . Headache   . Hypertension   . Myocardial infarction ([place])   . Right Groin Hematoma    a. 07/2016 following diagnostic cath  . Stroke [facility]) 2017    Past Surgical History:  Procedure Laterality Date  . CARDIAC CATHETERIZATION N/A 08/07/2016   Procedure: Left Heart Cath and Coronary Angiography;  Surgeon: Troy Sine, MD;  Location: [facility];  Service: Cardiovascular;  Laterality: N/A;  . CYST REMOVAL TRUNK Left    beneath L breast  . ENDARTERECTOMY Right 02/08/2018   Procedure: ENDARTERECTOMY CAROTID REDO;  Surgeon: Angelia Mould, MD;  Location: [facility];  Service: Vascular;  Laterality: Right;  . TUBAL LIGATION    . VASCULAR SURGERY Right    right carotid endarterectomy 07/22/05     Current Outpatient Medications  Medication Sig Dispense Refill  . acetaminophen (TYLENOL) 325 MG tablet Take 650-975 mg by mouth 3 (three) times daily as needed for moderate pain or headache.    . albuterol (PROVENTIL HFA) 108 (90 Base) MCG/ACT inhaler INHALE 2 PUFFS BY MOUTH EVERY 6 HOURS AS NEEDED FOR COUGHING, WHEEZING, OR SHORTNESS OF BREATH 20.1 Inhaler 1  . amLODipine  (NORVASC) 10 MG tablet Take 1 tablet (10 mg total) by mouth daily. 90 tablet 1  . aspirin EC 81 MG tablet Take 1 tablet (81 mg total) by mouth daily. 90 tablet 3  . atorvastatin (LIPITOR) 80 MG tablet Take 1 tablet (80 mg total) by mouth daily at 6 PM. 90 tablet 1  . cloNIDine (CATAPRES) 0.2 MG tablet Take 1 tablet (0.2 mg total) by mouth 2 (two) times daily. 180 tablet 1  . lisinopril (ZESTRIL) 40 MG tablet Take 1 tablet (40 mg total) by mouth daily. 90 tablet 1  . metoprolol tartrate (LOPRESSOR) 100 MG tablet Take 2 tablets (200 mg total) by mouth 2 (two) times daily. 360 tablet 1  . nitroGLYCERIN (NITROSTAT) 0.4 MG SL tablet PLACE 1 TABLET UNDER TONGUE AS NEEDED FOR CHEST PAIN EVERY 5 MINUTES X 3 MAX DOSES.  CALL 911 IF PAIN PERSISTS 25 tablet 3   No current facility-administered medications for this visit.    Allergies:  Penicillins   Social History: The patient  reports that she has been smoking cigarettes. She has a 21.00 pack-year smoking history. She has never used smokeless tobacco. She reports current drug use. Drugs: Marijuana and "Crack" cocaine. She reports that she does not drink alcohol.   Family History: The patient's family history includes COPD in her paternal uncle; Cancer in her maternal aunt, maternal uncle, and paternal uncle; Diabetes in her mother;  Heart attack in her father and mother; Heart disease in her father and mother; Hypertension in her father and mother; Stroke in her mother.   ROS:  Please see the history of present illness. Otherwise, complete review of systems is positive for {NONE DEFAULTED:18576::"none"}.  All other systems are reviewed and negative.   Physical Exam: VS:  LMP 07/18/2011 , BMI There is no height or weight on file to calculate BMI.  Wt Readings from Last 3 Encounters:  11/08/18 182 lb 8 oz (82.8 kg)  09/06/18 179 lb (81.2 kg)  08/09/18 181 lb (82.1 kg)    General: Patient appears comfortable at rest. HEENT: Conjunctiva and lids normal,  oropharynx clear with moist mucosa. Neck: Supple, no elevated JVP or carotid bruits, no thyromegaly. Lungs: Clear to auscultation, nonlabored breathing at rest. Cardiac: Regular rate and rhythm, no S3 or significant systolic murmur, no pericardial rub. Abdomen: Soft, nontender, no hepatomegaly, bowel sounds present, no guarding or rebound. Extremities: No pitting edema, distal pulses 2+. Skin: Warm and dry. Musculoskeletal: No kyphosis. Neuropsychiatric: Alert and oriented x3, affect grossly appropriate.  ECG:  An ECG dated 04/19/2018 was personally reviewed today and demonstrated:  Sinus rhythm with PAC and borderline low voltage.  Recent Labwork: 08/11/2018: ALT 15; AST 20; BUN 13; Creatinine, Ser 0.82; Hemoglobin 14.9; Platelets 311; Potassium 4.6; Sodium 139     Component Value Date/Time   CHOL 233 (H) 08/11/2018 1113   TRIG 213 (H) 08/11/2018 1113   HDL 39 (L) 08/11/2018 1113   CHOLHDL 6.0 08/11/2018 1113   VLDL 43 (H) 08/11/2018 1113   LDLCALC 151 (H) 08/11/2018 1113    Other Studies Reviewed Today:  Echocardiogram 08/08/2016: Study Conclusions  - Left ventricle: The cavity size was normal. There was mild concentric hypertrophy. Systolic function was normal. The estimated ejection fraction was in the range of 55% to 60%. Wall motion was normal; there were no regional wall motion abnormalities. Doppler parameters are consistent with abnormal left ventricular relaxation (grade 1 diastolic dysfunction). - Left atrium: The atrium was mildly dilated. - Atrial septum: No defect or patent foramen ovale was identified.  Cardiac catheterization 08/07/2016:  There is mild left ventricular systolic dysfunction.  The left ventricular ejection fraction is 45-50% by visual estimate.  There is no mitral valve regurgitation.  LV end diastolic pressure is mildly elevated.  Mid RCA lesion, 100 %stenosed.  Dist RCA lesion, 75 %stenosed.  Mid LAD-2 lesion, 60  %stenosed.  Mid LAD-1 lesion, 70 %stenosed.  Dist LAD-2 lesion, 45 %stenosed.  Dist LAD-1 lesion, 50 %stenosed.  2nd Mrg lesion, 40 %stenosed.  Moderate LV dysfunction with upper mid to basal inferior hypocontractility. Global ejection fraction is 45-50%.  Multivessel CAD with calcification of the proximal LAD with 70% followed by 60% stenoses between the first and second diagonal vessel with segmental 50% stenoses in the mid distal LAD; 40-50% diffuse narrowing in the distal circumflex marginal branch; an old total occlusion of the proximal RCA with bridging collaterals.  Carotid Dopplers 12/05/2018: IMPRESSION: 1. Mild right carotid bifurcation plaque resulting in less than 50% diameter stenosis. 2. 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 Abnormal antegrade flow signal in the left vertebral artery suggesting proximal occlusive disease.  Assessment and Plan:    Medication Adjustments/Labs and Tests Ordered: Current medicines are reviewed at length with the patient today.  Concerns regarding medicines are outlined above.   Tests Ordered: No orders of the defined types were placed in  this encounter.   Medication Changes: No orders of the defined types were placed in this encounter.   Disposition:  Follow up {follow up:15908}  Signed, Jonelle SidleSamuel G. Jaaziel Peatross, MD, [facility]ACC 03/14/2019 10:53 AM    [facility] [place] [address], [place], [place] [address] Phone: [phone]; Fax: [phone]

## 2019-05-01 ENCOUNTER — Telehealth: Payer: Self-pay | Admitting: Cardiology

## 2019-05-01 NOTE — Telephone Encounter (Signed)

## 2019-05-02 ENCOUNTER — Encounter: Payer: Self-pay | Admitting: Cardiology

## 2019-05-02 ENCOUNTER — Ambulatory Visit (INDEPENDENT_AMBULATORY_CARE_PROVIDER_SITE_OTHER): Payer: Self-pay | Admitting: Cardiology

## 2019-05-02 ENCOUNTER — Other Ambulatory Visit: Payer: Self-pay

## 2019-05-02 ENCOUNTER — Telehealth: Payer: Self-pay | Admitting: Cardiology

## 2019-05-02 VITALS — BP 178/92 | HR 61 | Temp 97.3°F | Ht 63.0 in | Wt 176.0 lb

## 2019-05-02 DIAGNOSIS — E782 Mixed hyperlipidemia: Secondary | ICD-10-CM

## 2019-05-02 DIAGNOSIS — I739 Peripheral vascular disease, unspecified: Secondary | ICD-10-CM

## 2019-05-02 DIAGNOSIS — I10 Essential (primary) hypertension: Secondary | ICD-10-CM

## 2019-05-02 DIAGNOSIS — Z720 Tobacco use: Secondary | ICD-10-CM

## 2019-05-02 DIAGNOSIS — I779 Disorder of arteries and arterioles, unspecified: Secondary | ICD-10-CM

## 2019-05-02 DIAGNOSIS — I25119 Atherosclerotic heart disease of native coronary artery with unspecified angina pectoris: Secondary | ICD-10-CM

## 2019-05-02 NOTE — Progress Notes (Signed)
Cardiology Office Note  Date: 05/02/2019   ID: [name], DOB 02/19/1961, MRN [id number]  PCP:  [name]  Cardiologist:  [name] DellSamuel Nataliee Shurtz, MD Electrophysiologist:  None   Chief Complaint  Patient presents with  . Cardiac follow-up    History of Present Illness: [name] is a medically complex complex 58 y.o. female last seen in September 2019.  She presents for a follow-up visit.  From a cardiac perspective she does not report any active angina symptoms or nitroglycerin use.  Blood pressure was elevated today, more so than at last visit, but this has been an issue over time.  She denies any substance abuse.  I reviewed her medications which she states she is taking regularly.  She has gained weight and has been eating out or getting takeout a lot.  I talked with her about salt restriction and weight loss.  She did have follow-up carotid Dopplers in March as detailed below.  She is due to see her PCP for lab work later this month.  I personally reviewed her ECG today which shows a sinus rhythm with low voltage and PVC.  Past Medical History:  Diagnosis Date  . AAA (abdominal aortic aneurysm) without rupture (HCC) 08/25/2016  . Anxiety   . Arthritis   . Bipolar 1 disorder (HCC)   . CAD (coronary artery disease)    a. 08/08/16 LAD 70%, 60% 1st diag, CTO RCA Med Rx EF 55%  . Carotid artery stenosis    Right carotid endarterectomy 07/22/05 with recurrent stenosis 01/2017; known left ICA occlusion  . Crack cocaine use   . Depression   . Headache   . Hypertension   . Myocardial infarction (HCC)   . Right Groin Hematoma    a. 07/2016 following diagnostic cath  . Stroke [facility](HCC) 2017    Past Surgical History:  Procedure Laterality Date  . CARDIAC CATHETERIZATION N/A 08/07/2016   Procedure: Left Heart Cath and Coronary Angiography;  Surgeon: [name] Biharihomas A Kelly, MD;  [facility];  Service: Cardiovascular;  Laterality: N/A;  . CYST REMOVAL  TRUNK Left    beneath L breast  . ENDARTERECTOMY Right 02/08/2018   Procedure: ENDARTERECTOMY CAROTID REDO;  Surgeon: [name] Hintickson, Christopher S, MD;  [facility];  Service: Vascular;  Laterality: Right;  . TUBAL LIGATION    . VASCULAR SURGERY Right    right carotid endarterectomy 07/22/05     Current Outpatient Medications  Medication Sig Dispense Refill  . acetaminophen (TYLENOL) 325 MG tablet Take 650-975 mg by mouth 3 (three) times daily as needed for moderate pain or headache.    . albuterol (PROVENTIL HFA) 108 (90 Base) MCG/ACT inhaler INHALE 2 PUFFS BY MOUTH EVERY 6 HOURS AS NEEDED FOR COUGHING, WHEEZING, OR SHORTNESS OF BREATH 20.1 Inhaler 1  . amLODipine (NORVASC) 10 MG tablet Take 1 tablet (10 mg total) by mouth daily. 90 tablet 1  . aspirin EC 81 MG tablet Take 1 tablet (81 mg total) by mouth daily. 90 tablet 3  . atorvastatin (LIPITOR) 80 MG tablet Take 1 tablet (80 mg total) by mouth daily at 6 PM. 90 tablet 1  . cloNIDine (CATAPRES) 0.2 MG tablet Take 1 tablet (0.2 mg total) by mouth 2 (two) times daily. 180 tablet 1  . lisinopril (ZESTRIL) 40 MG tablet Take 1 tablet (40 mg total) by mouth daily. 90 tablet 1  . metoprolol tartrate (LOPRESSOR) 100 MG tablet Take 2 tablets (200 mg total) by mouth 2 (  two) times daily. 360 tablet 1  . nitroGLYCERIN (NITROSTAT) 0.4 MG SL tablet PLACE 1 TABLET UNDER TONGUE AS NEEDED FOR CHEST PAIN EVERY 5 MINUTES X 3 MAX DOSES.  CALL 911 IF PAIN PERSISTS 25 tablet 3   No current facility-administered medications for this visit.    Allergies:  Penicillins   Social History: The patient  reports that she has been smoking cigarettes. She has a 21.00 pack-year smoking history. She has never used smokeless tobacco. She reports current drug use. Drugs: Marijuana and "Crack" cocaine. She reports that she does not drink alcohol.   ROS:  Please see the history of present illness. Otherwise, complete review of systems is positive for none.  All other systems  are reviewed and negative.   Physical Exam: VS:  BP (!) 178/92   Pulse 61   Temp (!) 97.3 F (36.3 C)   Ht 5\' 3"  (1.6 m)   Wt 176 lb (79.8 kg)   LMP 07/18/2011   SpO2 98%   BMI 31.18 kg/m , BMI Body mass index is 31.18 kg/m.  Wt Readings from Last 3 Encounters:  05/02/19 176 lb (79.8 kg)  11/08/18 182 lb 8 oz (82.8 kg)  09/06/18 179 lb (81.2 kg)    General: Patient appears comfortable at rest. HEENT: Conjunctiva and lids normal, wearing a mask. Neck: Supple, no elevated JVP, right CEA scar with soft bilateral bruits, no thyromegaly. Lungs: Diminished breath sounds without wheezing, nonlabored breathing at rest. Cardiac: Regular rate and rhythm, no S3, 2/6 systolic murmur. Abdomen: Soft, nontender, bowel sounds present. Extremities: No pitting edema, distal pulses 2+. Skin: Warm and dry. Musculoskeletal: No kyphosis. Neuropsychiatric: Alert and oriented x3, affect grossly appropriate.  ECG:  An ECG dated 04/19/2018 was personally reviewed today and demonstrated:  Sinus rhythm with PAC and nonspecific ST changes.  Recent Labwork: 08/11/2018: ALT 15; AST 20; BUN 13; Creatinine, Ser 0.82; Hemoglobin 14.9; Platelets 311; Potassium 4.6; Sodium 139     Component Value Date/Time   CHOL 233 (H) 08/11/2018 1113   TRIG 213 (H) 08/11/2018 1113   HDL 39 (L) 08/11/2018 1113   CHOLHDL 6.0 08/11/2018 1113   VLDL 43 (H) 08/11/2018 1113   LDLCALC 151 (H) 08/11/2018 1113    Other Studies Reviewed Today:  Carotid Dopplers 12/05/2018: IMPRESSION: 1. Mild right carotid bifurcation plaque resulting in less than 50% diameter stenosis. 2. 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 Abnormal antegrade flow signal in the left vertebral artery suggesting proximal occlusive disease.  Assessment and Plan:  1.  Multivessel CAD with plan for continued medical therapy and observation.  No changes made to current regimen.  2.   Essential hypertension with fluctuating control over time.  She denies any recurring substance abuse at this point.  We discussed her medications, I also talked with her about weight loss through diet and decrease sodium intake.  3.  Bilateral carotid artery disease status post redo right carotid endarterectomy.  Follow-up carotid Dopplers from March are outlined above.  She is asymptomatic at this time and remains on aspirin and statin.  4.  Ongoing tobacco abuse.  We have discussed smoking cessation over time.  Medication Adjustments/Labs and Tests Ordered: Current medicines are reviewed at length with the patient today.  Concerns regarding medicines are outlined above.   Tests Ordered: Orders Placed This Encounter  Procedures  . EKG 12-Lead    Medication Changes: No orders of the defined types were placed in  this encounter.   Disposition:  [place] in [place].  Signed, Jonelle SidleSamuel G. Gracen Southwell, MD, [facility]ACC 05/02/2019 3:06 PM    [facility] [address], [place], [place] [address] Phone: [phone]; Fax: [phone]

## 2019-05-02 NOTE — Telephone Encounter (Signed)
°  Precert needed for: Carotid Doppler in March 2021 dx: known disease   Location: [facility] [place]     Date: December 06, 2019

## 2019-05-02 NOTE — Patient Instructions (Addendum)
Medication Instructions:    Your physician recommends that you continue on your current medications as directed. Please refer to the Current Medication list given to you today.  Labwork:  NONE  Testing/Procedures: Your physician has requested that you have a carotid duplex in March 2021. This test is an ultrasound of the carotid arteries in your neck. It looks at blood flow through these arteries that supply the brain with blood. Allow one hour for this exam. There are no restrictions or special instructions.  Follow-Up:  Your physician recommends that you schedule a follow-up appointment in: 6 months. You will receive a reminder letter in the mail in about 4 months reminding you to call and schedule your appointment. If you don't receive this letter, please contact our office.  Any Other Special Instructions Will Be Listed Below (If Applicable).  If you need a refill on your cardiac medications before your next appointment, please call your pharmacy.

## 2019-05-09 ENCOUNTER — Ambulatory Visit: Payer: Self-pay | Admitting: Physician Assistant

## 2019-05-11 ENCOUNTER — Other Ambulatory Visit (HOSPITAL_COMMUNITY)
Admission: RE | Admit: 2019-05-11 | Discharge: 2019-05-11 | Disposition: A | Discharge status: Home / Self Care | Payer: Self-pay | Source: Ambulatory Visit | Attending: Physician Assistant | Admitting: Physician Assistant

## 2019-05-11 DIAGNOSIS — E785 Hyperlipidemia, unspecified: Secondary | ICD-10-CM | POA: Insufficient documentation

## 2019-05-11 DIAGNOSIS — I10 Essential (primary) hypertension: Secondary | ICD-10-CM | POA: Insufficient documentation

## 2019-05-11 LAB — COMPREHENSIVE METABOLIC PANEL
ALT: 41 U/L (ref 0–44)
AST: 30 U/L (ref 15–41)
Albumin: 3.7 g/dL (ref 3.5–5.0)
Alkaline Phosphatase: 107 U/L (ref 38–126)
Anion gap: 7 (ref 5–15)
BUN: 25 mg/dL — ABNORMAL HIGH (ref 6–20)
CO2: 25 mmol/L (ref 22–32)
Calcium: 9 mg/dL (ref 8.9–10.3)
Chloride: 106 mmol/L (ref 98–111)
Creatinine, Ser: 0.9 mg/dL (ref 0.44–1.00)
GFR calc Af Amer: >60 mL/min (ref >60)
GFR calc non Af Amer: >60 mL/min (ref >60)
Glucose, Bld: 109 mg/dL — ABNORMAL HIGH (ref 70–99)
Potassium: 4.5 mmol/L (ref 3.5–5.1)
Sodium: 138 mmol/L (ref 135–145)
Total Bilirubin: 0.8 mg/dL (ref 0.3–1.2)
Total Protein: 7.2 g/dL (ref 6.5–8.1)

## 2019-05-11 LAB — LIPID PANEL
Cholesterol: 223 mg/dL — ABNORMAL HIGH (ref 0–200)
HDL: 40 mg/dL — ABNORMAL LOW (ref >40)
LDL Cholesterol: 153 mg/dL — ABNORMAL HIGH (ref 0–99)
Total CHOL/HDL Ratio: 5.6 RATIO
Triglycerides: 148 mg/dL (ref <150)
VLDL: 30 mg/dL (ref 0–40)

## 2019-05-15 ENCOUNTER — Encounter: Payer: Self-pay | Admitting: Physician Assistant

## 2019-05-15 ENCOUNTER — Ambulatory Visit: Payer: Self-pay | Admitting: Physician Assistant

## 2019-05-15 VITALS — BP 120/74 | HR 54 | Temp 97.7°F | Wt 192.4 lb

## 2019-05-15 DIAGNOSIS — I714 Abdominal aortic aneurysm, without rupture, unspecified: Secondary | ICD-10-CM

## 2019-05-15 DIAGNOSIS — Z5320 Procedure and treatment not carried out because of patient's decision for unspecified reasons: Secondary | ICD-10-CM

## 2019-05-15 DIAGNOSIS — E785 Hyperlipidemia, unspecified: Secondary | ICD-10-CM

## 2019-05-15 DIAGNOSIS — I2510 Atherosclerotic heart disease of native coronary artery without angina pectoris: Secondary | ICD-10-CM

## 2019-05-15 DIAGNOSIS — I10 Essential (primary) hypertension: Secondary | ICD-10-CM

## 2019-05-15 DIAGNOSIS — F17200 Nicotine dependence, unspecified, uncomplicated: Secondary | ICD-10-CM

## 2019-05-15 NOTE — Patient Instructions (Signed)

## 2019-05-15 NOTE — Progress Notes (Signed)
BP 120/74   Pulse (!) 54   Temp 97.7 F (36.5 C)   Wt 192 lb 6.4 oz (87.3 kg)   LMP 07/18/2011   SpO2 98%   BMI 34.08 kg/m    Subjective:    Patient ID: [name], female    DOB: April 20, 1961, 58 y.o.   MRN: [id number]  HPI: TEA COLLUMS is a 58 y.o. female presenting on 05/15/2019 for No chief complaint on file.   HPI  Pt had a negative covid 19 screening questionnaire    She is only taking her lipitor "here and there" ; she is not taking it daily  She says she is avoiding cocaine.  She says she has only used it one time since her cardiology appointment (on 05/02/19).    She is still smoking.  She complains of weight gain.  She is not exercising.    Relevant past medical, surgical, family and social history reviewed and updated as indicated. Interim medical history since our last visit reviewed. Allergies and medications reviewed and updated.   Current Outpatient Medications:  .  acetaminophen (TYLENOL) 325 MG tablet, Take 650-975 mg by mouth 3 (three) times daily as needed for moderate pain or headache., Disp: , Rfl:  .  albuterol (PROVENTIL HFA) 108 (90 Base) MCG/ACT inhaler, INHALE 2 PUFFS BY MOUTH EVERY 6 HOURS AS NEEDED FOR COUGHING, WHEEZING, OR SHORTNESS OF BREATH, Disp: 20.1 Inhaler, Rfl: 1 .  amLODipine (NORVASC) 10 MG tablet, Take 1 tablet (10 mg total) by mouth daily., Disp: 90 tablet, Rfl: 1 .  aspirin EC 81 MG tablet, Take 1 tablet (81 mg total) by mouth daily., Disp: 90 tablet, Rfl: 3 .  atorvastatin (LIPITOR) 80 MG tablet, Take 1 tablet (80 mg total) by mouth daily at 6 PM., Disp: 90 tablet, Rfl: 1 .  cloNIDine (CATAPRES) 0.2 MG tablet, Take 1 tablet (0.2 mg total) by mouth 2 (two) times daily., Disp: 180 tablet, Rfl: 1 .  lisinopril (ZESTRIL) 40 MG tablet, Take 1 tablet (40 mg total) by mouth daily., Disp: 90 tablet, Rfl: 1 .  metoprolol tartrate (LOPRESSOR) 100 MG tablet, Take 2 tablets (200 mg total) by mouth 2 (two) times daily., Disp: 360  tablet, Rfl: 1 .  nitroGLYCERIN (NITROSTAT) 0.4 MG SL tablet, PLACE 1 TABLET UNDER TONGUE AS NEEDED FOR CHEST PAIN EVERY 5 MINUTES X 3 MAX DOSES.  CALL 911 IF PAIN PERSISTS, Disp: 25 tablet, Rfl: 3     Review of Systems  Per HPI unless specifically indicated above     Objective:    BP 120/74   Pulse (!) 54   Temp 97.7 F (36.5 C)   Wt 192 lb 6.4 oz (87.3 kg)   LMP 07/18/2011   SpO2 98%   BMI 34.08 kg/m   Wt Readings from Last 3 Encounters:  05/15/19 192 lb 6.4 oz (87.3 kg)  05/02/19 176 lb (79.8 kg)  11/08/18 182 lb 8 oz (82.8 kg)    Physical Exam Vitals signs reviewed.  Constitutional:      Appearance: She is well-developed.  HENT:     Head: Normocephalic and atraumatic.  Neck:     Musculoskeletal: Neck supple.  Cardiovascular:     Rate and Rhythm: Normal rate and regular rhythm.  Pulmonary:     Effort: Pulmonary effort is normal.     Breath sounds: Normal breath sounds.  Abdominal:     General: Bowel sounds are normal.     Palpations: Abdomen is soft. There  is no mass.     Tenderness: There is no abdominal tenderness.  Lymphadenopathy:     Cervical: No cervical adenopathy.  Skin:    General: Skin is warm and dry.  Neurological:     Mental Status: She is alert and oriented to person, place, and time.  Psychiatric:        Attention and Perception: Attention normal.        Speech: Speech normal.        Behavior: Behavior is cooperative.     Results for orders placed or performed during the hospital encounter of 05/11/19  Comprehensive metabolic panel  Result Value Ref Range   Sodium 138 135 - 145 mmol/L   Potassium 4.5 3.5 - 5.1 mmol/L   Chloride 106 98 - 111 mmol/L   CO2 25 22 - 32 mmol/L   Glucose, Bld 109 (H) 70 - 99 mg/dL   BUN 25 (H) 6 - 20 mg/dL   Creatinine, Ser 1.610.90 0.44 - 1.00 mg/dL   Calcium 9.0 8.9 - 09.610.3 mg/dL   Total Protein 7.2 6.5 - 8.1 g/dL   Albumin 3.7 3.5 - 5.0 g/dL   AST 30 15 - 41 U/L   ALT 41 0 - 44 U/L   Alkaline  Phosphatase 107 38 - 126 U/L   Total Bilirubin 0.8 0.3 - 1.2 mg/dL   GFR calc non Af Amer >60 >60 mL/min   GFR calc Af Amer >60 >60 mL/min   Anion gap 7 5 - 15  Lipid panel  Result Value Ref Range   Cholesterol 223 (H) 0 - 200 mg/dL   Triglycerides 045148 <409<150 mg/dL   HDL 40 (L) >81>40 mg/dL   Total CHOL/HDL Ratio 5.6 RATIO   VLDL 30 0 - 40 mg/dL   LDL Cholesterol 191153 (H) 0 - 99 mg/dL      Assessment & Plan:    Encounter Diagnoses  Name Primary?  . Essential hypertension Yes  . Dyslipidemia   . Tobacco use disorder   . Screening mammography declined   . Pap smear of cervix declined   . Colon cancer screening declined   . AAA (abdominal aortic aneurysm) without rupture (HCC)   . Coronary artery disease involving native coronary artery of native heart without angina pectoris       -Reviewed labs with pt -pt declines mammogram and pap and iFOBT -Counseled pt on low sodium diet and gave reading information -No changes to medications today -Encouraged regular exercise -Encouraged smoking cessation -pt is past due for repeat US.  It was ordered in February but not done.   -Pt to follow up in 3 months.  She is to contact office sooner prn

## 2019-07-10 ENCOUNTER — Other Ambulatory Visit: Payer: Self-pay

## 2019-07-10 ENCOUNTER — Ambulatory Visit (HOSPITAL_COMMUNITY)
Admission: RE | Admit: 2019-07-10 | Discharge: 2019-07-10 | Disposition: A | Discharge status: Home / Self Care | Payer: Self-pay | Source: Ambulatory Visit | Attending: Physician Assistant | Admitting: Physician Assistant

## 2019-07-10 DIAGNOSIS — I714 Abdominal aortic aneurysm, without rupture, unspecified: Secondary | ICD-10-CM

## 2019-08-28 ENCOUNTER — Other Ambulatory Visit: Payer: Self-pay | Admitting: Physician Assistant

## 2019-08-30 ENCOUNTER — Ambulatory Visit: Payer: Self-pay | Admitting: Physician Assistant

## 2019-08-30 ENCOUNTER — Encounter: Payer: Self-pay | Admitting: Physician Assistant

## 2019-08-30 VITALS — BP 128/94

## 2019-08-30 DIAGNOSIS — F17200 Nicotine dependence, unspecified, uncomplicated: Secondary | ICD-10-CM

## 2019-08-30 DIAGNOSIS — I10 Essential (primary) hypertension: Secondary | ICD-10-CM

## 2019-08-30 DIAGNOSIS — R05 Cough: Secondary | ICD-10-CM

## 2019-08-30 DIAGNOSIS — E785 Hyperlipidemia, unspecified: Secondary | ICD-10-CM

## 2019-08-30 DIAGNOSIS — R0989 Other specified symptoms and signs involving the circulatory and respiratory systems: Secondary | ICD-10-CM

## 2019-08-30 DIAGNOSIS — Z20822 Contact with and (suspected) exposure to covid-19: Secondary | ICD-10-CM

## 2019-08-30 DIAGNOSIS — R52 Pain, unspecified: Secondary | ICD-10-CM

## 2019-08-30 DIAGNOSIS — R059 Cough, unspecified: Secondary | ICD-10-CM

## 2019-08-30 NOTE — Progress Notes (Signed)
BP (!) 128/94   LMP 07/18/2011    Subjective:    Patient ID: [name], female    DOB: 07/11/1961, 58 y.o.   MRN: [id number]  HPI: [name] is a 58 y.o. female presenting on 08/30/2019 for No chief complaint on file.   HPI  This is a telemedicine appointment due to coronavirus pandemic..  Telephone as patient does not have video enabled device.  I connected with  [name] on 08/30/19 by a video enabled telemedicine application and verified that I am speaking with the correct person using two identifiers.   I discussed the limitations of evaluation and management by telemedicine. The patient expressed understanding and agreed to proceed.  Patient is at home.  Provider is at office.  Patient is a 58 year old female with appointment today to follow-up for hypertension and hyperlipidemia.  Patient states that she feels bad.  She complains of cough and runny nose for 2 days now.  She said she started feeling really bad and achy last night.  She denies any fevers.  Patient has not had any shortness of breath but she does continue to smoke.  Patient is unaware of any known exposures to COVID-19.   Patient is currently not working    Relevant past medical, surgical, family and social history reviewed and updated as indicated. Interim medical history since our last visit reviewed. Allergies and medications reviewed and updated.   Current Outpatient Medications:  .  albuterol (PROVENTIL HFA) 108 (90 Base) MCG/ACT inhaler, INHALE 2 PUFFS BY MOUTH EVERY 6 HOURS AS NEEDED FOR COUGHING, WHEEZING, OR SHORTNESS OF BREATH, Disp: 20.1 g, Rfl: 1 .  amLODipine (NORVASC) 10 MG tablet, TAKE 1 Tablet BY MOUTH EVERY DAY, Disp: 90 tablet, Rfl: 1 .  aspirin EC 81 MG tablet, Take 1 tablet (81 mg total) by mouth daily., Disp: 90 tablet, Rfl: 3 .  atorvastatin (LIPITOR) 80 MG tablet, TAKE 1 Tablet BY MOUTH EVERY DAY AT 6PM, Disp: 90 tablet, Rfl: 1 .  cloNIDine (CATAPRES) 0.2 MG  tablet, TAKE 1 Tablet  BY MOUTH TWICE DAILY, Disp: 180 tablet, Rfl: 1 .  lisinopril (ZESTRIL) 40 MG tablet, TAKE 1 Tablet BY MOUTH EVERY DAY, Disp: 90 tablet, Rfl: 1 .  metoprolol tartrate (LOPRESSOR) 100 MG tablet, TAKE 2 Tablets BY MOUTH TWICE DAILY, Disp: 360 tablet, Rfl: 1 .  acetaminophen (TYLENOL) 325 MG tablet, Take 650-975 mg by mouth 3 (three) times daily as needed for moderate pain or headache., Disp: , Rfl:  .  nitroGLYCERIN (NITROSTAT) 0.4 MG SL tablet, PLACE 1 TABLET UNDER TONGUE AS NEEDED FOR CHEST PAIN EVERY 5 MINUTES X 3 MAX DOSES.  CALL 911 IF PAIN PERSISTS (Patient not taking: Reported on 08/30/2019), Disp: 25 tablet, Rfl: 3    Review of Systems  Per HPI unless specifically indicated above     Objective:    BP (!) 128/94   LMP 07/18/2011   Wt Readings from Last 3 Encounters:  05/15/19 192 lb 6.4 oz (87.3 kg)  05/02/19 176 lb (79.8 kg)  11/08/18 182 lb 8 oz (82.8 kg)    Physical Exam Constitutional:      Comments: Patient definitely does not sound like her usual self.  She does sound congested.  No wheezing is audible.  HENT:     Nose: Congestion present.  Pulmonary:     Effort: No respiratory distress.  Neurological:     Mental Status: She is alert and oriented to person, place, and time.  Psychiatric:        Attention and Perception: Attention normal.        Behavior: Behavior is cooperative.         Assessment & Plan:   Encounter Diagnoses  Name Primary?  . Suspected 2019 novel coronavirus infection Yes  . Cough   . Runny nose   . Body aches   . Essential hypertension   . Dyslipidemia   . Tobacco use disorder      Patient to go get tested for COVID-19 today at [facility].  Patient is counseled on self quarantine.  She is urged to avoid smoking.  She has inhaler to use as needed.  Discussed with patient reasons she might emergency room including difficulty with breathing or chest pains.  Patient will have follow-up appointment in  January for her hypertension and dyslipidemia.  Patient is to contact office sooner if needed.

## 2019-10-10 ENCOUNTER — Ambulatory Visit: Payer: Self-pay | Admitting: Physician Assistant

## 2019-10-16 ENCOUNTER — Emergency Department (HOSPITAL_COMMUNITY): Payer: Self-pay

## 2019-10-16 ENCOUNTER — Other Ambulatory Visit: Payer: Self-pay

## 2019-10-16 ENCOUNTER — Encounter (HOSPITAL_COMMUNITY): Payer: Self-pay | Admitting: Emergency Medicine

## 2019-10-16 ENCOUNTER — Emergency Department (HOSPITAL_COMMUNITY)
Admission: EM | Admit: 2019-10-16 | Discharge: 2019-10-16 | Disposition: A | Discharge status: Home / Self Care | Payer: Self-pay | Attending: Emergency Medicine | Admitting: Emergency Medicine

## 2019-10-16 ENCOUNTER — Other Ambulatory Visit (HOSPITAL_COMMUNITY)
Admission: RE | Admit: 2019-10-16 | Discharge: 2019-10-16 | Disposition: A | Discharge status: Home / Self Care | Payer: Self-pay | Source: Ambulatory Visit | Attending: Physician Assistant | Admitting: Physician Assistant

## 2019-10-16 DIAGNOSIS — I10 Essential (primary) hypertension: Secondary | ICD-10-CM

## 2019-10-16 DIAGNOSIS — Z79899 Other long term (current) drug therapy: Secondary | ICD-10-CM | POA: Insufficient documentation

## 2019-10-16 DIAGNOSIS — Z7982 Long term (current) use of aspirin: Secondary | ICD-10-CM | POA: Insufficient documentation

## 2019-10-16 DIAGNOSIS — E785 Hyperlipidemia, unspecified: Secondary | ICD-10-CM

## 2019-10-16 DIAGNOSIS — R609 Edema, unspecified: Secondary | ICD-10-CM | POA: Insufficient documentation

## 2019-10-16 DIAGNOSIS — I2510 Atherosclerotic heart disease of native coronary artery without angina pectoris: Secondary | ICD-10-CM | POA: Insufficient documentation

## 2019-10-16 DIAGNOSIS — R0602 Shortness of breath: Secondary | ICD-10-CM | POA: Insufficient documentation

## 2019-10-16 DIAGNOSIS — F17210 Nicotine dependence, cigarettes, uncomplicated: Secondary | ICD-10-CM | POA: Insufficient documentation

## 2019-10-16 LAB — BASIC METABOLIC PANEL
Anion gap: 7 (ref 5–15)
BUN: 9 mg/dL (ref 6–20)
CO2: 25 mmol/L (ref 22–32)
Calcium: 9 mg/dL (ref 8.9–10.3)
Chloride: 108 mmol/L (ref 98–111)
Creatinine, Ser: 0.7 mg/dL (ref 0.44–1.00)
GFR calc Af Amer: >60 mL/min (ref >60)
GFR calc non Af Amer: >60 mL/min (ref >60)
Glucose, Bld: 94 mg/dL (ref 70–99)
Potassium: 4.2 mmol/L (ref 3.5–5.1)
Sodium: 140 mmol/L (ref 135–145)

## 2019-10-16 LAB — COMPREHENSIVE METABOLIC PANEL
ALT: 29 U/L (ref 0–44)
AST: 28 U/L (ref 15–41)
Albumin: 3.4 g/dL — ABNORMAL LOW (ref 3.5–5.0)
Alkaline Phosphatase: 107 U/L (ref 38–126)
Anion gap: 7 (ref 5–15)
BUN: 8 mg/dL (ref 6–20)
CO2: 25 mmol/L (ref 22–32)
Calcium: 8.9 mg/dL (ref 8.9–10.3)
Chloride: 109 mmol/L (ref 98–111)
Creatinine, Ser: 0.65 mg/dL (ref 0.44–1.00)
GFR calc Af Amer: >60 mL/min (ref >60)
GFR calc non Af Amer: >60 mL/min (ref >60)
Glucose, Bld: 108 mg/dL — ABNORMAL HIGH (ref 70–99)
Potassium: 4 mmol/L (ref 3.5–5.1)
Sodium: 141 mmol/L (ref 135–145)
Total Bilirubin: 0.9 mg/dL (ref 0.3–1.2)
Total Protein: 6.4 g/dL — ABNORMAL LOW (ref 6.5–8.1)

## 2019-10-16 LAB — URINALYSIS, ROUTINE W REFLEX MICROSCOPIC
Bilirubin Urine: NEGATIVE
Glucose, UA: NEGATIVE mg/dL
Hgb urine dipstick: NEGATIVE
Ketones, ur: NEGATIVE mg/dL
Leukocytes,Ua: NEGATIVE
Nitrite: NEGATIVE
Protein, ur: NEGATIVE mg/dL
Specific Gravity, Urine: 1.014 (ref 1.005–1.030)
pH: 5 (ref 5.0–8.0)

## 2019-10-16 LAB — CBC WITH DIFFERENTIAL/PLATELET
Abs Immature Granulocytes: 0.02 10*3/uL (ref 0.00–0.07)
Basophils Absolute: 0 10*3/uL (ref 0.0–0.1)
Basophils Relative: 1 %
Eosinophils Absolute: 0.2 10*3/uL (ref 0.0–0.5)
Eosinophils Relative: 3 %
HCT: 44.7 % (ref 36.0–46.0)
Hemoglobin: 14.2 g/dL (ref 12.0–15.0)
Immature Granulocytes: 0 %
Lymphocytes Relative: 38 %
Lymphs Abs: 2.1 10*3/uL (ref 0.7–4.0)
MCH: 30.1 pg (ref 26.0–34.0)
MCHC: 31.8 g/dL (ref 30.0–36.0)
MCV: 94.9 fL (ref 80.0–100.0)
Monocytes Absolute: 0.4 10*3/uL (ref 0.1–1.0)
Monocytes Relative: 7 %
Neutro Abs: 2.8 10*3/uL (ref 1.7–7.7)
Neutrophils Relative %: 51 %
Platelets: 249 10*3/uL (ref 150–400)
RBC: 4.71 MIL/uL (ref 3.87–5.11)
RDW: 12.8 % (ref 11.5–15.5)
WBC: 5.6 10*3/uL (ref 4.0–10.5)
nRBC: 0 % (ref 0.0–0.2)

## 2019-10-16 LAB — LIPID PANEL
Cholesterol: 158 mg/dL (ref 0–200)
HDL: 33 mg/dL — ABNORMAL LOW (ref >40)
LDL Cholesterol: 103 mg/dL — ABNORMAL HIGH (ref 0–99)
Total CHOL/HDL Ratio: 4.8 RATIO
Triglycerides: 111 mg/dL (ref <150)
VLDL: 22 mg/dL (ref 0–40)

## 2019-10-16 LAB — TROPONIN I (HIGH SENSITIVITY)
Troponin I (High Sensitivity): 10 ng/L (ref <18)
Troponin I (High Sensitivity): 9 ng/L (ref <18)

## 2019-10-16 LAB — D-DIMER, QUANTITATIVE (NOT AT ARMC): D-Dimer, Quant: 0.61 ug/mL-FEU — ABNORMAL HIGH (ref 0.00–0.50)

## 2019-10-16 LAB — BRAIN NATRIURETIC PEPTIDE: B Natriuretic Peptide: 126 pg/mL — ABNORMAL HIGH (ref 0.0–100.0)

## 2019-10-16 MED ORDER — ACETAMINOPHEN 325 MG PO TABS
650.0000 mg | ORAL_TABLET | Freq: Once | ORAL | Status: AC
  Administered 2019-10-16: 650 mg via ORAL
  Filled 2019-10-16: qty 2

## 2019-10-16 MED ORDER — FUROSEMIDE 20 MG PO TABS: 20.0000 mg | ORAL_TABLET | Freq: Every day | ORAL | 0 refills | Status: DC

## 2019-10-16 MED ORDER — IOHEXOL 350 MG/ML SOLN
100.0000 mL | Freq: Once | INTRAVENOUS | Status: AC | PRN
  Administered 2019-10-16: 17:00:00 100 mL via INTRAVENOUS

## 2019-10-16 NOTE — ED Provider Notes (Signed)
Received signout at the beginning of shift, please see previous providers note for complete H&P.  This is a 59 year old female presenting with complaints of gradual onset of shortness of breath of the past 2 weeks as well as complaining of leg swelling.  DVT study was obtained showed no evidence of DVT.  Her D-dimer was mildly elevated therefore a chest CTA was obtained showing no evidence of PE or acute finding.  EKG shows sinus bradycardia however she has had sinus bradycardia from previous EKG.  I was instructed to discharge patient for outpatient follow-up for further evaluation of her condition.  Will prescribe a short course of diuretic as patient does have mildly elevated BNP and some fluid retention.  Return precaution discussed.  Smoking cessation discussed.  ED ECG REPORT   Date: 10/16/2019  Rate: 48  Rhythm: sinus bradycardia  QRS Axis: normal  Intervals: normal  ST/T Wave abnormalities: nonspecific ST changes  Conduction Disutrbances:none  Narrative Interpretation:   Old EKG Reviewed: unchanged  I have personally reviewed the EKG tracing and agree with the computerized printout as noted. Sinus bradycardia, similar to prior.   BP (!) 116/96   Pulse (!) 55   Temp 97.7 F (36.5 C) (Temporal)   Resp (!) 21   Ht 5\' 3"  (1.6 m)   Wt 99.8 kg   LMP 07/18/2011   SpO2 99%   BMI 38.97 kg/m   Results for orders placed or performed during the hospital encounter of 10/16/19  Basic metabolic panel  Result Value Ref Range   Sodium 140 135 - 145 mmol/L   Potassium 4.2 3.5 - 5.1 mmol/L   Chloride 108 98 - 111 mmol/L   CO2 25 22 - 32 mmol/L   Glucose, Bld 94 70 - 99 mg/dL   BUN 9 6 - 20 mg/dL   Creatinine, Ser 10/18/19 0.44 - 1.00 mg/dL   Calcium 9.0 8.9 - 9.56 mg/dL   GFR calc non Af Amer >60 >60 mL/min   GFR calc Af Amer >60 >60 mL/min   Anion gap 7 5 - 15  CBC with Differential  Result Value Ref Range   WBC 5.6 4.0 - 10.5 K/uL   RBC 4.71 3.87 - 5.11 MIL/uL   Hemoglobin 14.2  12.0 - 15.0 g/dL   HCT 38.7 56.4 - 33.2 %   MCV 94.9 80.0 - 100.0 fL   MCH 30.1 26.0 - 34.0 pg   MCHC 31.8 30.0 - 36.0 g/dL   RDW 95.1 88.4 - 16.6 %   Platelets 249 150 - 400 K/uL   nRBC 0.0 0.0 - 0.2 %   Neutrophils Relative % 51 %   Neutro Abs 2.8 1.7 - 7.7 K/uL   Lymphocytes Relative 38 %   Lymphs Abs 2.1 0.7 - 4.0 K/uL   Monocytes Relative 7 %   Monocytes Absolute 0.4 0.1 - 1.0 K/uL   Eosinophils Relative 3 %   Eosinophils Absolute 0.2 0.0 - 0.5 K/uL   Basophils Relative 1 %   Basophils Absolute 0.0 0.0 - 0.1 K/uL   Immature Granulocytes 0 %   Abs Immature Granulocytes 0.02 0.00 - 0.07 K/uL  D-dimer, quantitative  Result Value Ref Range   D-Dimer, Quant 0.61 (H) 0.00 - 0.50 ug/mL-FEU  Brain natriuretic peptide  Result Value Ref Range   B Natriuretic Peptide 126.0 (H) 0.0 - 100.0 pg/mL  Urinalysis, Routine w reflex microscopic  Result Value Ref Range   Color, Urine YELLOW YELLOW   APPearance CLEAR CLEAR  Specific Gravity, Urine 1.014 1.005 - 1.030   pH 5.0 5.0 - 8.0   Glucose, UA NEGATIVE NEGATIVE mg/dL   Hgb urine dipstick NEGATIVE NEGATIVE   Bilirubin Urine NEGATIVE NEGATIVE   Ketones, ur NEGATIVE NEGATIVE mg/dL   Protein, ur NEGATIVE NEGATIVE mg/dL   Nitrite NEGATIVE NEGATIVE   Leukocytes,Ua NEGATIVE NEGATIVE  Troponin I (High Sensitivity)  Result Value Ref Range   Troponin I (High Sensitivity) 10 <18 ng/L  Troponin I (High Sensitivity)  Result Value Ref Range   Troponin I (High Sensitivity) 9 <18 ng/L   DG Chest 2 View  Result Date: 10/16/2019 CLINICAL DATA:  Shortness of breath. Bilateral leg swelling and pain. EXAM: CHEST - 2 VIEW COMPARISON:  August 25, 2016 FINDINGS: The heart size and mediastinal contours are within normal limits. Both lungs are clear. The visualized skeletal structures are unremarkable. IMPRESSION: No active cardiopulmonary disease. Electronically Signed   By: Dorise Bullion III M.D   On: 10/16/2019 12:30   CT Angio Chest PE W/Cm  &/Or Wo Cm  Result Date: 10/16/2019 CLINICAL DATA:  Shortness of breath and bilateral leg swelling. EXAM: CT ANGIOGRAPHY CHEST WITH CONTRAST TECHNIQUE: Multidetector CT imaging of the chest was performed using the standard protocol during bolus administration of intravenous contrast. Multiplanar CT image reconstructions and MIPs were obtained to evaluate the vascular anatomy. CONTRAST:  124mL OMNIPAQUE IOHEXOL 350 MG/ML SOLN COMPARISON:  August 25, 2016 FINDINGS: Cardiovascular: There is marked severity calcification of the thoracic aorta. Satisfactory opacification of the pulmonary arteries to the segmental level. No evidence of pulmonary embolism. Normal heart size. No pericardial effusion. There is marked severity coronary artery calcification. Mediastinum/Nodes: No enlarged mediastinal, hilar, or axillary lymph nodes. Thyroid gland, trachea, and esophagus demonstrate no significant findings. Lungs/Pleura: Lungs are clear. No pleural effusion or pneumothorax. Upper Abdomen: No acute abnormality. Musculoskeletal: No chest wall abnormality. No acute or significant osseous findings. Review of the MIP images confirms the above findings. IMPRESSION: 1. No CT evidence of pulmonary embolism. 2. No acute abnormality in the chest. 3. Marked severity coronary artery calcification. Electronically Signed   By: Virgina Norfolk M.D.   On: 10/16/2019 17:54   US Venous Img Lower  Left (DVT Study)  Result Date: 10/16/2019 CLINICAL DATA:  LEFT leg swelling for 1 month EXAM: LEFT LOWER EXTREMITY VENOUS DOPPLER ULTRASOUND TECHNIQUE: Gray-scale sonography with compression, as well as color and duplex ultrasound, were performed to evaluate the deep venous system(s) from the level of the common femoral vein through the popliteal and proximal calf veins. COMPARISON:  None FINDINGS: VENOUS Normal compressibility of the common femoral, superficial femoral, and popliteal veins, as well as the visualized calf veins. Visualized  portions of profunda femoral vein and great saphenous vein unremarkable. No filling defects to suggest DVT on grayscale or color Doppler imaging. Doppler waveforms show normal direction of venous flow, normal respiratory phasicity and response to augmentation. Limited views of the contralateral common femoral vein are unremarkable. OTHER None Limitations: none IMPRESSION: No evidence of deep venous thrombosis in the LEFT lower extremity. If clinical symptoms are inconsistent or if there are persistent or worsening symptoms, further imaging (possibly involving the iliac veins) may be warranted. Electronically Signed   By: Lavonia Dana M.D.   On: 10/16/2019 15:09      Domenic Moras, PA-C 10/16/19 [id number]    Noemi Chapel, MD 10/17/19 [phone]

## 2019-10-16 NOTE — Discharge Instructions (Signed)
Take lasix as prescribed for the next 4 days to help with fluid retention.  Avoid tobacco use.  Follow up closely with your doctor for further care.

## 2019-10-16 NOTE — ED Notes (Signed)
[place] at bedside at this time. Will access PIV when [place] has finished with pt scan.

## 2019-10-16 NOTE — ED Notes (Signed)
IV access attempted x 1 without success. Herbert Seta, RN made aware.

## 2019-10-16 NOTE — ED Triage Notes (Addendum)
Pt reports shortness of breath, bilateral leg swelling and pain. Pt reports cardiac history, and reports left carotid "comepletely blocked." pt denies chest pain, numbness.

## 2019-10-16 NOTE — ED Notes (Signed)
ED Provider at bedside. 

## 2019-10-16 NOTE — ED Provider Notes (Signed)
[facility] EMERGENCY DEPARTMENT Provider Note   CSN: [id number] Arrival date & time: 10/16/19  1124     History Chief Complaint  Patient presents with  . Shortness of Breath    [name] is a 58 y.o. female with PMHx HTN, CAD s/p MI, depression, anxiety, bipolar disorder, AAA, who presents to the ED today complaining of gradual onset, constant, shortness of breath x 2 weeks.  [name] also complains of left leg swelling for the past 2 weeks.  [name] states that both her legs have been swelling but the left is worse than normal.  [name] states that it was much bigger last night but has gone down in size since.  Patient states [name] has been intermittently getting some substernal chest pains although states none recently and none currently.  Patient denies any history of DVT/PE.  No history of prolonged travel or immobilization.  No active malignancy.  No exogenous hormone use.  No hemoptysis.  Patient denies being on any type of fluid pill.  He denies fever, chills, cough, active chest pain, palpitations, abdominal pain, nausea, vomiting, diaphoresis, any other associated symptoms.  The history is provided by the patient and medical records.       Past Medical History:  Diagnosis Date  . AAA (abdominal aortic aneurysm) without rupture ([place]) 08/25/2016  . Anxiety   . Arthritis   . Bipolar 1 disorder ([place])   . CAD (coronary artery disease)    a. 08/08/16 LAD 70%, 60% 1st diag, CTO RCA Med Rx EF 55%  . Carotid artery stenosis    Right carotid endarterectomy 07/22/05 with recurrent stenosis 01/2017; known left ICA occlusion  . Crack cocaine use   . Depression   . Headache   . Hypertension   . Myocardial infarction ([place])   . Right Groin Hematoma    a. 07/2016 following diagnostic cath  . Stroke [facility]) 2017    Patient Active Problem List   Diagnosis Date Noted  . Carotid stenosis 02/08/2018  . AAA (abdominal aortic aneurysm) without rupture ([place]) 08/25/2016  . Carotid disease,  bilateral ([place]) 08/18/2016  . Cardiomyopathy, ischemic 08/18/2016  . CAD (coronary artery disease) 08/08/2016  . Acute blood loss anemia 08/08/2016  . Dyslipidemia 08/08/2016  . Bipolar 1 disorder ([place])   . Essential hypertension   . Anxiety   . Crack cocaine use   . Tobacco abuse   . Right Groin Hematoma   . Chest pain 08/07/2016  . Unstable angina pectoris ([place]) 08/06/2016    Past Surgical History:  Procedure Laterality Date  . CARDIAC CATHETERIZATION N/A 08/07/2016   Procedure: Left Heart Cath and Coronary Angiography;  Surgeon: Troy Sine, MD;  Location: [place] CV LAB;  Service: Cardiovascular;  Laterality: N/A;  . CYST REMOVAL TRUNK Left    beneath L breast  . ENDARTERECTOMY Right 02/08/2018   Procedure: ENDARTERECTOMY CAROTID REDO;  Surgeon: Angelia Mould, MD;  Location: [place];  Service: Vascular;  Laterality: Right;  . TUBAL LIGATION    . VASCULAR SURGERY Right    right carotid endarterectomy 07/22/05      OB History    Gravida  4   Para  4   Term  3   Preterm  1   AB      Living        SAB      TAB      Ectopic      Multiple      Live Births  Family History  Problem Relation Age of Onset  . Heart attack Mother   . Stroke Mother   . Hypertension Mother   . Diabetes Mother   . Heart disease Mother   . Heart attack Father   . Hypertension Father   . Heart disease Father   . Cancer Maternal Aunt   . Cancer Maternal Uncle   . COPD Paternal Uncle        2 pat uncles with COPD  . Cancer Paternal Uncle        1 pat uncle with cancer    Social History   Tobacco Use  . Smoking status: Current Every Day Smoker    Packs/day: 0.50    Years: 42.00    Pack years: 21.00    Types: Cigarettes  . Smokeless tobacco: Never Used  Substance Use Topics  . Alcohol use: No  . Drug use: Yes    Types: Marijuana, "Crack" cocaine    Comment: last crack 10/2017,marjuana 01/31/18    Home Medications Prior to Admission  medications   Medication Sig Start Date End Date Taking? Authorizing Provider  albuterol (PROVENTIL HFA) 108 (90 Base) MCG/ACT inhaler INHALE 2 PUFFS BY MOUTH EVERY 6 HOURS AS NEEDED FOR COUGHING, WHEEZING, OR SHORTNESS OF BREATH 08/28/19  Yes Jacquelin Hawking, PA-C  amLODipine (NORVASC) 10 MG tablet TAKE 1 Tablet BY MOUTH EVERY DAY 08/28/19  Yes Jacquelin Hawking, PA-C  aspirin EC 81 MG tablet Take 1 tablet (81 mg total) by mouth daily. 06/10/18  Yes Jonelle Sidle, MD  atorvastatin (LIPITOR) 80 MG tablet TAKE 1 Tablet BY MOUTH EVERY DAY AT 6PM 08/28/19  Yes Jacquelin Hawking, PA-C  cloNIDine (CATAPRES) 0.2 MG tablet TAKE 1 Tablet  BY MOUTH TWICE DAILY 08/28/19  Yes Jacquelin Hawking, PA-C  lisinopril (ZESTRIL) 40 MG tablet TAKE 1 Tablet BY MOUTH EVERY DAY 08/28/19  Yes Jacquelin Hawking, PA-C  metoprolol tartrate (LOPRESSOR) 100 MG tablet TAKE 2 Tablets BY MOUTH TWICE DAILY 08/28/19  Yes Jacquelin Hawking, PA-C  nitroGLYCERIN (NITROSTAT) 0.4 MG SL tablet PLACE 1 TABLET UNDER TONGUE AS NEEDED FOR CHEST PAIN EVERY 5 MINUTES X 3 MAX DOSES.  CALL 911 IF PAIN PERSISTS 02/07/19  Yes Jacquelin Hawking, PA-C  acetaminophen (TYLENOL) 325 MG tablet Take 650-975 mg by mouth 3 (three) times daily as needed for moderate pain or headache.    [provider]    Allergies    Penicillins  Review of Systems   Review of Systems  Constitutional: Negative for chills and fever.  Respiratory: Positive for shortness of breath. Negative for cough.   Cardiovascular: Positive for chest pain and leg swelling. Negative for palpitations.  Gastrointestinal: Negative for abdominal pain, diarrhea, nausea and vomiting.  All other systems reviewed and are negative.   Physical Exam Updated Vital Signs BP (!) 153/85 (BP Location: Right Arm)   Pulse (!) 51   Temp 97.7 F (36.5 C) (Temporal)   Resp 20   Ht 5\' 3"  (1.6 m)   Wt 99.8 kg   LMP 07/18/2011   SpO2 97%   BMI 38.97 kg/m   Physical Exam Vitals and  nursing note reviewed.  Constitutional:      Appearance: [name] is not ill-appearing.  HENT:     Head: Normocephalic and atraumatic.  Eyes:     Conjunctiva/sclera: Conjunctivae normal.  Cardiovascular:     Rate and Rhythm: Normal rate and regular rhythm.     Pulses: Normal pulses.  Pulmonary:     Effort: Pulmonary  effort is normal.     Breath sounds: Rhonchi present. No decreased breath sounds, wheezing or rales.  Chest:     Chest wall: No tenderness.  Abdominal:     Palpations: Abdomen is soft.     Tenderness: There is no abdominal tenderness. There is no guarding or rebound.  Musculoskeletal:     Cervical back: Neck supple.     Right lower leg: Edema present.     Left lower leg: Edema present.     Comments: 1+ bilateral pitting edema. Questionable very minimal increase in edema left leg compared to right. 2+ DP and PT pulses. No tenderness to calf or popliteal area.   Skin:    General: Skin is warm and dry.  Neurological:     Mental Status: [name] is alert.     ED Results / Procedures / Treatments   Labs (all labs ordered are listed, but only abnormal results are displayed) Labs Reviewed  D-DIMER, QUANTITATIVE (NOT AT [facility]) - Abnormal; Notable for the following components:      Result Value   D-Dimer, Quant 0.61 (*)    All other components within normal limits  BRAIN NATRIURETIC PEPTIDE - Abnormal; Notable for the following components:   B Natriuretic Peptide 126.0 (*)    All other components within normal limits  BASIC METABOLIC PANEL  CBC WITH DIFFERENTIAL/PLATELET  URINALYSIS, ROUTINE W REFLEX MICROSCOPIC  TROPONIN I (HIGH SENSITIVITY)  TROPONIN I (HIGH SENSITIVITY)    EKG None  Radiology DG Chest 2 View  Result Date: 10/16/2019 CLINICAL DATA:  Shortness of breath. Bilateral leg swelling and pain. EXAM: CHEST - 2 VIEW COMPARISON:  August 25, 2016 FINDINGS: The heart size and mediastinal contours are within normal limits. Both lungs are clear. The visualized  skeletal structures are unremarkable. IMPRESSION: No active cardiopulmonary disease. Electronically Signed   By: Gerome Sam III M.D   On: 10/16/2019 12:30   US Venous Img Lower  Left (DVT Study)  Result Date: 10/16/2019 CLINICAL DATA:  LEFT leg swelling for 1 month EXAM: LEFT LOWER EXTREMITY VENOUS DOPPLER ULTRASOUND TECHNIQUE: Gray-scale sonography with compression, as well as color and duplex ultrasound, were performed to evaluate the deep venous system(s) from the level of the common femoral vein through the popliteal and proximal calf veins. COMPARISON:  None FINDINGS: VENOUS Normal compressibility of the common femoral, superficial femoral, and popliteal veins, as well as the visualized calf veins. Visualized portions of profunda femoral vein and great saphenous vein unremarkable. No filling defects to suggest DVT on grayscale or color Doppler imaging. Doppler waveforms show normal direction of venous flow, normal respiratory phasicity and response to augmentation. Limited views of the contralateral common femoral vein are unremarkable. OTHER None Limitations: none IMPRESSION: No evidence of deep venous thrombosis in the LEFT lower extremity. If clinical symptoms are inconsistent or if there are persistent or worsening symptoms, further imaging (possibly involving the iliac veins) may be warranted. Electronically Signed   By: Ulyses Southward M.D.   On: 10/16/2019 15:09    Procedures Procedures (including critical care time)  Medications Ordered in ED Medications - No data to display  ED Course  I have reviewed the triage vital signs and the nursing notes.  Pertinent labs & imaging results that were available during my care of the patient were reviewed by me and considered in my medical decision making (see chart for details).  59 year old female presents the ED complaining of shortness of breath and bilateral leg swelling although worse on the  left side for the past 2 weeks.  [name] is denying  any chest pain although then states [name] is intermittently been having chest pain, none today.  On arrival to the ED [name] is afebrile, nontachycardic and nontachypneic.  Patient is bradycardic in the 50s however [name] has history of same.  Her blood pressure is normotensive.  [name] is satting 97% on room air and able to speak in full sentences without difficulty.  [name] is denying any COVID-19 positive exposure.  [name] has mostly concerned about her leg swelling, states that her left leg was significantly more swollen than it is today in the past couple of days.  [name] is not on any type of fluid pill.  [name] did have an echo done in 2017 with an ejection fraction of 55 to 60%.  [name] is not having any active chest pain I am much more suspicious for ACS.  However with the leg swelling and shortness of breath will obtain D-dimer and get DVT study to rule out clot.  Will obtain troponins given questionable history of intermittent chest pain.   EKG with sinus bradycardia, no specific EKG changes today.  Her CBC is without leukocytosis.  Hemoglobin is stable at 14.  No electrolyte abnormalities on BMP.  Initial troponin of 10, will repeat.  BNP 126, no signs of vascular congestion on chest x-ray and patient does not appear overtly fluid overloaded.  [name] does have 1+ bilateral pitting edema to lower extremities.  Do not feel [name] needs to have Lasix at this time.  D-dimer is mildly elevated at 0.61.  Given this will obtain CTA.   5:12 PM At shift change case signed out to Fayrene Helper, PA-C, who will dispo patient accordingly after CTA. If negative [name] can be discharged home with close PCP follow up.   Clinical Course as of Oct 15 1710  Mon Oct 16, 2019  1603 D-Dimer, Quant(!): 0.61 [MV]    Clinical Course User Index [MV] Tanda Rockers, PA-C   MDM Rules/Calculators/A&P                       Final Clinical Impression(s) / ED Diagnoses Final diagnoses:  None    Rx / DC Orders ED Discharge Orders    None         Tanda Rockers, PA-C 10/16/19 1713    Pricilla Loveless, MD 10/17/19 [phone]

## 2019-10-23 ENCOUNTER — Ambulatory Visit: Payer: Self-pay | Admitting: Physician Assistant

## 2019-10-31 ENCOUNTER — Telehealth (INDEPENDENT_AMBULATORY_CARE_PROVIDER_SITE_OTHER): Payer: Self-pay | Admitting: Cardiology

## 2019-10-31 ENCOUNTER — Encounter: Payer: Self-pay | Admitting: Cardiology

## 2019-10-31 VITALS — Ht 63.0 in | Wt 200.0 lb

## 2019-10-31 DIAGNOSIS — I6523 Occlusion and stenosis of bilateral carotid arteries: Secondary | ICD-10-CM

## 2019-10-31 DIAGNOSIS — I25119 Atherosclerotic heart disease of native coronary artery with unspecified angina pectoris: Secondary | ICD-10-CM

## 2019-10-31 DIAGNOSIS — I10 Essential (primary) hypertension: Secondary | ICD-10-CM

## 2019-10-31 DIAGNOSIS — Z720 Tobacco use: Secondary | ICD-10-CM

## 2019-10-31 DIAGNOSIS — M7989 Other specified soft tissue disorders: Secondary | ICD-10-CM

## 2019-10-31 DIAGNOSIS — E782 Mixed hyperlipidemia: Secondary | ICD-10-CM

## 2019-10-31 MED ORDER — POTASSIUM CHLORIDE ER 10 MEQ PO TBCR: 10.0000 meq | EXTENDED_RELEASE_TABLET | Freq: Every day | ORAL | 3 refills | Status: DC

## 2019-10-31 MED ORDER — FUROSEMIDE 20 MG PO TABS: 20.0000 mg | ORAL_TABLET | Freq: Every day | ORAL | 3 refills | Status: DC

## 2019-10-31 NOTE — Progress Notes (Signed)
Virtual Visit via Telephone Note   This visit type was conducted due to national recommendations for restrictions regarding the COVID-19 Pandemic (e.g. social distancing) in an effort to limit this patient's exposure and mitigate transmission in our community.  Due to her co-morbid illnesses, this patient is at least at moderate risk for complications without adequate follow up.  This format is felt to be most appropriate for this patient at this time.  The patient did not have access to video technology/had technical difficulties with video requiring transitioning to audio format only (telephone).  All issues noted in this document were discussed and addressed.  No physical exam could be performed with this format.  Please refer to the patient's chart for her  consent to telehealth for [facility]   Date:  10/31/2019   ID:  [name], DOB 09/15/1961, MRN [id number]  Patient Location: Home Provider Location: Office  PCP:  Jacquelin Hawking, PA-C  Cardiologist:  Nona Dell, MD Electrophysiologist:  None   Evaluation Performed:  Follow-Up Visit  Chief Complaint:  Cardiac follow-up  History of Present Illness:    [name] is a 59 y.o. female last seen in August 2020.  We spoke by phone today.  Records indicate recent ER visit in January with shortness of breath.  D-dimer was mildly elevated.  Lower extremity Dopplers negative for DVT and chest CTA did not show pulmonary embolus.  She was discharged with a short course of diuretics.  High-sensitivity troponin I levels were negative.  BNP 126.  Last echocardiogram was in 2017 at which point LVEF was in the range of 55 to 60% with mild diastolic dysfunction.  She states that she took Lasix 20 mg daily for 4 days until the prescription ran out.  She thinks that it did help her leg swelling to some degree.  Concurrently, she has gained a significant amount of weight over the last several months.  She attributes this to  inactivity.  No orthopnea or PND.  No progressive angina.  I reviewed her medications which are otherwise stable and outlined below.  She reports compliance.   Past Medical History:  Diagnosis Date  . AAA (abdominal aortic aneurysm) without rupture ([facility]) 08/25/2016  . Anxiety   . Arthritis   . Bipolar 1 disorder ([facility])   . CAD (coronary artery disease)    a. 08/08/16 LAD 70%, 60% 1st diag, CTO RCA Med Rx EF 55%  . Carotid artery stenosis    Right carotid endarterectomy 07/22/05 with recurrent stenosis 01/2017; known left ICA occlusion  . Crack cocaine use   . Depression   . Headache   . Hypertension   . Myocardial infarction ([facility])   . Right Groin Hematoma    a. 07/2016 following diagnostic cath  . Stroke [facility]) 2017   Past Surgical History:  Procedure Laterality Date  . CARDIAC CATHETERIZATION N/A 08/07/2016   Procedure: Left Heart Cath and Coronary Angiography;  Surgeon: Lennette Bihari, MD;  Location: [facility] INVASIVE CV LAB;  Service: Cardiovascular;  Laterality: N/A;  . CYST REMOVAL TRUNK Left    beneath L breast  . ENDARTERECTOMY Right 02/08/2018   Procedure: ENDARTERECTOMY CAROTID REDO;  Surgeon: Chuck Hint, MD;  Location: [facility] OR;  Service: Vascular;  Laterality: Right;  . TUBAL LIGATION    . VASCULAR SURGERY Right    right carotid endarterectomy 07/22/05      Current Meds  Medication Sig  . acetaminophen (TYLENOL) 325 MG tablet Take 650-975 mg by  mouth 3 (three) times daily as needed for moderate pain or headache.  . albuterol (PROVENTIL HFA) 108 (90 Base) MCG/ACT inhaler INHALE 2 PUFFS BY MOUTH EVERY 6 HOURS AS NEEDED FOR COUGHING, WHEEZING, OR SHORTNESS OF BREATH  . amLODipine (NORVASC) 10 MG tablet TAKE 1 Tablet BY MOUTH EVERY DAY  . aspirin EC 81 MG tablet Take 1 tablet (81 mg total) by mouth daily.  Marland Kitchen atorvastatin (LIPITOR) 80 MG tablet TAKE 1 Tablet BY MOUTH EVERY DAY AT 6PM  . cloNIDine (CATAPRES) 0.2 MG tablet TAKE 1 Tablet  BY MOUTH TWICE DAILY  .  lisinopril (ZESTRIL) 40 MG tablet TAKE 1 Tablet BY MOUTH EVERY DAY  . metoprolol tartrate (LOPRESSOR) 100 MG tablet TAKE 2 Tablets BY MOUTH TWICE DAILY  . nitroGLYCERIN (NITROSTAT) 0.4 MG SL tablet PLACE 1 TABLET UNDER TONGUE AS NEEDED FOR CHEST PAIN EVERY 5 MINUTES X 3 MAX DOSES.  CALL 911 IF PAIN PERSISTS     Allergies:   Penicillins   Social History   Tobacco Use  . Smoking status: Current Every Day Smoker    Packs/day: 0.50    Years: 42.00    Pack years: 21.00    Types: Cigarettes    Start date: 10/05/1974  . Smokeless tobacco: Never Used  Substance Use Topics  . Alcohol use: No  . Drug use: Yes    Types: Marijuana, "Crack" cocaine    Comment: last crack 10/2017,marjuana 01/31/18     Family Hx: The patient's family history includes COPD in her paternal uncle; Cancer in her maternal aunt, maternal uncle, and paternal uncle; Diabetes in her mother; Heart attack in her father and mother; Heart disease in her father and mother; Hypertension in her father and mother; Stroke in her mother.  ROS:   Please see the history of present illness.    All other systems reviewed and are negative.   Prior CV studies:   The following studies were reviewed today:  Carotid Dopplers 12/05/2018: IMPRESSION: 1. Mild right carotid bifurcation plaque resulting in less than 50% diameter stenosis. 2. 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 Abnormal antegrade flow signal in the left vertebral artery suggesting proximal occlusive disease.  Chest CTA 10/16/2019: FINDINGS: Cardiovascular: There is marked severity calcification of the thoracic aorta. Satisfactory opacification of the pulmonary arteries to the segmental level. No evidence of pulmonary embolism. Normal heart size. No pericardial effusion. There is marked severity coronary artery calcification.  Mediastinum/Nodes: No enlarged mediastinal, hilar, or axillary lymph nodes.  Thyroid gland, trachea, and esophagus demonstrate no significant findings.  Lungs/Pleura: Lungs are clear. No pleural effusion or pneumothorax.  Upper Abdomen: No acute abnormality.  Musculoskeletal: No chest wall abnormality. No acute or significant osseous findings.  Review of the MIP images confirms the above findings.  IMPRESSION: 1. No CT evidence of pulmonary embolism. 2. No acute abnormality in the chest. 3. Marked severity coronary artery calcification.  Labs/Other Tests and Data Reviewed:    EKG:  An ECG dated 10/16/2019 was personally reviewed today and demonstrated:  Sinus bradycardia with low voltage, rule out inferior infarct pattern.  Recent Labs: 10/16/2019: ALT 29; B Natriuretic Peptide 126.0; BUN 9; Creatinine, Ser 0.70; Hemoglobin 14.2; Platelets 249; Potassium 4.2; Sodium 140   Recent Lipid Panel Lab Results  Component Value Date/Time   CHOL 158 10/16/2019 11:13 AM   TRIG 111 10/16/2019 11:13 AM   HDL 33 (L) 10/16/2019 11:13 AM   CHOLHDL 4.8 10/16/2019 11:13 AM  LDLCALC 103 (H) 10/16/2019 11:13 AM    Wt Readings from Last 3 Encounters:  10/31/19 200 lb (90.7 kg)  10/16/19 220 lb (99.8 kg)  05/15/19 192 lb 6.4 oz (87.3 kg)     Objective:    Vital Signs:  Ht 5\' 3"  (1.6 m)   Wt 200 lb (90.7 kg)   LMP 07/18/2011   BMI 35.43 kg/m    She was unable to take vital signs today. Patient spoke in full sentences, not short of breath. No audible wheezing or coughing. Speech pattern normal.  ASSESSMENT & PLAN:    1.  Recent leg swelling as outlined above.  She has also had significant weight gain, but attributes this to inactivity.  Last echocardiogram was in 2017 at which point LVEF was 55 to 60% with mild diastolic dysfunction.  We will plan to repeat an echocardiogram.  For now resume Lasix 20 mg daily with KCl 10 mEq daily.  No change in other medications.  2.  Multivessel CAD with plan for medical therapy and observation in the absence of  accelerating angina.  Continue aspirin, Lipitor, lisinopril, Lopressor, and as needed nitroglycerin.  3.  Bilateral carotid artery disease status post redo right carotid arterectomy.  Follow-up carotid Dopplers are scheduled for March.  4.  Tobacco abuse.  Smoking cessation discussed over time.   Time:   Today, I have spent 6 minutes with the patient with telehealth technology discussing the above problems.     Medication Adjustments/Labs and Tests Ordered: Current medicines are reviewed at length with the patient today.  Concerns regarding medicines are outlined above.   Tests Ordered: Orders Placed This Encounter  Procedures  . ECHOCARDIOGRAM COMPLETE    Medication Changes: Meds ordered this encounter  Medications  . furosemide (LASIX) 20 MG tablet    Sig: Take 1 tablet (20 mg total) by mouth daily.    Dispense:  90 tablet    Refill:  3    10/31/2019 NEW  . potassium chloride (KLOR-CON) 10 MEQ tablet    Sig: Take 1 tablet (10 mEq total) by mouth daily.    Dispense:  90 tablet    Refill:  3    10/31/2019 NEW    Follow Up:  test results.   Signed, 12/29/2019, MD  10/31/2019 11:58 AM    [facility]

## 2019-10-31 NOTE — Patient Instructions (Addendum)
Medication Instructions:   Your physician has recommended you make the following change in your medication:   Start furosemide 20 mg by mouth daily  Start potassium 10 meq by mouth daily  Continue other medications the same  Labwork:  NONE  Testing/Procedures: Your physician has requested that you have an echocardiogram. Echocardiography is a painless test that uses sound waves to create images of your heart. It provides your doctor with information about the size and shape of your heart and how well your heart's chambers and valves are working. This procedure takes approximately one hour. There are no restrictions for this procedure.  Follow-Up:  Your physician recommends that you schedule a follow-up appointment in: pending.   Any Other Special Instructions Will Be Listed Below (If Applicable).  If you need a refill on your cardiac medications before your next appointment, please call your pharmacy.

## 2019-11-02 ENCOUNTER — Other Ambulatory Visit: Payer: Self-pay

## 2019-11-02 ENCOUNTER — Ambulatory Visit (INDEPENDENT_AMBULATORY_CARE_PROVIDER_SITE_OTHER): Payer: Self-pay

## 2019-11-02 DIAGNOSIS — I25119 Atherosclerotic heart disease of native coronary artery with unspecified angina pectoris: Secondary | ICD-10-CM

## 2019-11-07 ENCOUNTER — Telehealth: Payer: Self-pay | Admitting: *Deleted

## 2019-11-07 ENCOUNTER — Encounter: Payer: Self-pay | Admitting: *Deleted

## 2019-11-07 NOTE — Telephone Encounter (Signed)
-----   Message from Jonelle Sidle, MD sent at 11/02/2019  5:19 PM EST ----- Results reviewed.  LVEF remains normal range at 60 to 65% with mild diastolic dysfunction.  She was started back on Lasix with potassium supplements.  Continue with current regimen and arrange follow-up over the next 6 months.

## 2019-11-09 NOTE — Telephone Encounter (Signed)
Patient informed. Copy sent to PCP °

## 2019-12-14 ENCOUNTER — Encounter: Payer: Self-pay | Admitting: Physician Assistant

## 2019-12-14 ENCOUNTER — Ambulatory Visit: Payer: Self-pay | Admitting: Physician Assistant

## 2019-12-14 DIAGNOSIS — Z5320 Procedure and treatment not carried out because of patient's decision for unspecified reasons: Secondary | ICD-10-CM

## 2019-12-14 DIAGNOSIS — I779 Disorder of arteries and arterioles, unspecified: Secondary | ICD-10-CM

## 2019-12-14 DIAGNOSIS — F17200 Nicotine dependence, unspecified, uncomplicated: Secondary | ICD-10-CM

## 2019-12-14 DIAGNOSIS — R0602 Shortness of breath: Secondary | ICD-10-CM

## 2019-12-14 DIAGNOSIS — I10 Essential (primary) hypertension: Secondary | ICD-10-CM

## 2019-12-14 DIAGNOSIS — I2510 Atherosclerotic heart disease of native coronary artery without angina pectoris: Secondary | ICD-10-CM

## 2019-12-14 DIAGNOSIS — E785 Hyperlipidemia, unspecified: Secondary | ICD-10-CM

## 2019-12-14 DIAGNOSIS — Z2821 Immunization not carried out because of patient refusal: Secondary | ICD-10-CM

## 2019-12-14 NOTE — Progress Notes (Signed)
LMP 07/18/2011    Subjective:    Patient ID: [name], female    DOB: 04/25/1961, 59 y.o.   MRN: [id number]  HPI: [name] is a 59 y.o. female presenting on 12/14/2019 for No chief complaint on file.   HPI    This is a telemedicine appointment due to coronavirus pandemic.  It is via telephone as pt does not have video enabled device  I connected with  [name] on 12/14/19 by a video enabled telemedicine application and verified that I am speaking with the correct person using two identifiers.   I discussed the limitations of evaluation and management by telemedicine. The patient expressed understanding and agreed to proceed.  Pt is at home.  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    Pt is 59yoF with htn, dyslipidemia, CAD, carotid artery disease, AAA, tobacco use disorder,    Pt had recent appointment with cardiology.   Carotid US was ordered.  She had echo updated in February.  Pt says she is current with [employer] / [employer] financial assistance  Pt says she can't check her bp because the Batteries died in bp machine that she was given at no cost by the clinic.   Pt is still Smoking cigarettes and marijuana.  She is Not using cocaine  Pt reports she is having sob.  She is using rescue inhaler 5 or 6 times daily some days and says it doesn't help much.   Pt wonders if her weight gain is contributing to her sob or if she has copd.   She has gained 20 or 30 pounds since stopping cocaine.   Pt was seen in ER in January for SOB.  CT done that visit was unremarkable other than the calcification of coronaries.        Relevant past medical, surgical, family and social history reviewed and updated as indicated. Interim medical history since our last visit reviewed. Allergies and medications reviewed and updated.   Current Outpatient Medications:  .  albuterol (PROVENTIL HFA) 108 (90 Base) MCG/ACT inhaler, INHALE 2 PUFFS BY MOUTH EVERY 6 HOURS AS NEEDED FOR COUGHING,  WHEEZING, OR SHORTNESS OF BREATH, Disp: 20.1 g, Rfl: 1 .  amLODipine (NORVASC) 10 MG tablet, TAKE 1 Tablet BY MOUTH EVERY DAY, Disp: 90 tablet, Rfl: 1 .  aspirin EC 81 MG tablet, Take 1 tablet (81 mg total) by mouth daily., Disp: 90 tablet, Rfl: 3 .  atorvastatin (LIPITOR) 80 MG tablet, TAKE 1 Tablet BY MOUTH EVERY DAY AT 6PM, Disp: 90 tablet, Rfl: 1 .  cloNIDine (CATAPRES) 0.2 MG tablet, TAKE 1 Tablet  BY MOUTH TWICE DAILY, Disp: 180 tablet, Rfl: 1 .  furosemide (LASIX) 20 MG tablet, Take 1 tablet (20 mg total) by mouth daily., Disp: 90 tablet, Rfl: 3 .  lisinopril (ZESTRIL) 40 MG tablet, TAKE 1 Tablet BY MOUTH EVERY DAY, Disp: 90 tablet, Rfl: 1 .  metoprolol tartrate (LOPRESSOR) 100 MG tablet, TAKE 2 Tablets BY MOUTH TWICE DAILY, Disp: 360 tablet, Rfl: 1 .  nitroGLYCERIN (NITROSTAT) 0.4 MG SL tablet, PLACE 1 TABLET UNDER TONGUE AS NEEDED FOR CHEST PAIN EVERY 5 MINUTES X 3 MAX DOSES.  CALL 911 IF PAIN PERSISTS, Disp: 25 tablet, Rfl: 3 .  potassium chloride (KLOR-CON) 10 MEQ tablet, Take 1 tablet (10 mEq total) by mouth daily. (Patient not taking: Reported on 12/14/2019), Disp: 90 tablet, Rfl: 3     Review of Systems  Per HPI unless specifically indicated above  Objective:    LMP 07/18/2011   Wt Readings from Last 3 Encounters:  10/31/19 200 lb (90.7 kg)  10/16/19 220 lb (99.8 kg)  05/15/19 192 lb 6.4 oz (87.3 kg)    Physical Exam Pulmonary:     Effort: No respiratory distress.  Neurological:     Mental Status: She is alert and oriented to person, place, and time.  Psychiatric:        Attention and Perception: Attention normal.        Mood and Affect: Mood normal.        Speech: Speech normal.        Behavior: Behavior is cooperative.            Assessment & Plan:     1. HTN -Pt was given a BP machine but she is noncompliant with its use and maintenance.  Encouraged her to put batteries in the machine and use it to monitor her BP as discussed.   -pt to continue  amlodipine, clonidine, lisinopril metoprolol -need to check bmp, particullarly in light of her being out of her K+.  She is encouraged to get back on it  2. Dyslipidemia On atorvastatin  3. sob -Will get PFTs.  Suspect pt has some component of COPD due to years of smoking but her lack of response to inhaler  May indicate other contributors to her sob.   Encouraged smoking cessation (both cigarettes and MJ)  4. CAD Per cardiology.  No angina presently  5. Carotid artery disease [place] ordered by cardiology  6. AAA Monitoring up to date.  Repeat October  7. HCM Pt declined screening mammogram Pt declined covid vaccination   Pt to follow up 1 month.  She is to contact office sooner prn

## 2020-01-04 ENCOUNTER — Ambulatory Visit (INDEPENDENT_AMBULATORY_CARE_PROVIDER_SITE_OTHER): Payer: Self-pay

## 2020-01-04 ENCOUNTER — Other Ambulatory Visit: Payer: Self-pay

## 2020-01-04 DIAGNOSIS — I779 Disorder of arteries and arterioles, unspecified: Secondary | ICD-10-CM

## 2020-01-08 ENCOUNTER — Telehealth: Payer: Self-pay | Admitting: *Deleted

## 2020-01-08 NOTE — Telephone Encounter (Signed)
-----   Message from Jonelle Sidle, MD sent at 01/08/2020  8:21 AM EDT ----- Results reviewed.  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

## 2020-01-11 NOTE — Telephone Encounter (Signed)
Patient informed. Copy sent to PCP °

## 2020-01-19 ENCOUNTER — Other Ambulatory Visit: Payer: Self-pay

## 2020-01-19 ENCOUNTER — Other Ambulatory Visit (HOSPITAL_COMMUNITY)
Admission: RE | Admit: 2020-01-19 | Discharge: 2020-01-19 | Disposition: A | Discharge status: Home / Self Care | Payer: Self-pay | Source: Ambulatory Visit | Attending: Physician Assistant | Admitting: Physician Assistant

## 2020-01-19 DIAGNOSIS — I10 Essential (primary) hypertension: Secondary | ICD-10-CM | POA: Insufficient documentation

## 2020-01-19 LAB — BASIC METABOLIC PANEL
Anion gap: 10 (ref 5–15)
BUN: 12 mg/dL (ref 6–20)
CO2: 26 mmol/L (ref 22–32)
Calcium: 8.9 mg/dL (ref 8.9–10.3)
Chloride: 105 mmol/L (ref 98–111)
Creatinine, Ser: 0.76 mg/dL (ref 0.44–1.00)
GFR calc Af Amer: >60 mL/min (ref >60)
GFR calc non Af Amer: >60 mL/min (ref >60)
Glucose, Bld: 92 mg/dL (ref 70–99)
Potassium: 4.7 mmol/L (ref 3.5–5.1)
Sodium: 141 mmol/L (ref 135–145)

## 2020-01-22 ENCOUNTER — Encounter: Payer: Self-pay | Admitting: Physician Assistant

## 2020-01-22 ENCOUNTER — Ambulatory Visit: Payer: Self-pay | Admitting: Physician Assistant

## 2020-01-22 VITALS — BP 162/87

## 2020-01-22 DIAGNOSIS — I2510 Atherosclerotic heart disease of native coronary artery without angina pectoris: Secondary | ICD-10-CM

## 2020-01-22 DIAGNOSIS — F17200 Nicotine dependence, unspecified, uncomplicated: Secondary | ICD-10-CM

## 2020-01-22 DIAGNOSIS — E785 Hyperlipidemia, unspecified: Secondary | ICD-10-CM

## 2020-01-22 DIAGNOSIS — I779 Disorder of arteries and arterioles, unspecified: Secondary | ICD-10-CM

## 2020-01-22 DIAGNOSIS — I10 Essential (primary) hypertension: Secondary | ICD-10-CM

## 2020-01-22 MED ORDER — ADVAIR HFA 115-21 MCG/ACT IN AERO: 2.0000 | INHALATION_SPRAY | Freq: Two times a day (BID) | RESPIRATORY_TRACT | 1 refills | Status: DC

## 2020-01-22 NOTE — Progress Notes (Signed)
BP (!) 162/87   LMP 07/18/2011    Subjective:    Patient ID: [name], female    DOB: 11-04-1960, 59 y.o.   MRN: [id number]  HPI: [name] is a 59 y.o. female presenting on 01/22/2020 for Hypertension   HPI   This is a telemedicine appintment due to coronavirus pandemic.  It is via Telephone as pt does not have a video enabled device.  I connected with  [name] on 01/22/20 by a video enabled telemedicine application and verified that I am speaking with the correct person using two identifiers.   I discussed the limitations of evaluation and management by telemedicine. The patient expressed understanding and agreed to proceed.  Pt is at home.   Pt is 59yoF with follow up for HTN today. She is Only using MJ and cigarettes.  No cocaine. She checks her bp at home sometimes and says it's good- runs usually in the 150s.     Relevant past medical, surgical, family and social history reviewed and updated as indicated. Interim medical history since our last visit reviewed. Allergies and medications reviewed and updated.   Current Outpatient Medications:  .  albuterol (PROVENTIL HFA) 108 (90 Base) MCG/ACT inhaler, INHALE 2 PUFFS BY MOUTH EVERY 6 HOURS AS NEEDED FOR COUGHING, WHEEZING, OR SHORTNESS OF BREATH, Disp: 20.1 g, Rfl: 1 .  amLODipine (NORVASC) 10 MG tablet, TAKE 1 Tablet BY MOUTH EVERY DAY, Disp: 90 tablet, Rfl: 1 .  aspirin EC 81 MG tablet, Take 1 tablet (81 mg total) by mouth daily., Disp: 90 tablet, Rfl: 3 .  atorvastatin (LIPITOR) 80 MG tablet, TAKE 1 Tablet BY MOUTH EVERY DAY AT 6PM, Disp: 90 tablet, Rfl: 1 .  cloNIDine (CATAPRES) 0.2 MG tablet, TAKE 1 Tablet  BY MOUTH TWICE DAILY, Disp: 180 tablet, Rfl: 1 .  furosemide (LASIX) 20 MG tablet, Take 1 tablet (20 mg total) by mouth daily., Disp: 90 tablet, Rfl: 3 .  lisinopril (ZESTRIL) 40 MG tablet, TAKE 1 Tablet BY MOUTH EVERY DAY, Disp: 90 tablet, Rfl: 1 .  metoprolol tartrate (LOPRESSOR) 100 MG  tablet, TAKE 2 Tablets BY MOUTH TWICE DAILY, Disp: 360 tablet, Rfl: 1 .  nitroGLYCERIN (NITROSTAT) 0.4 MG SL tablet, PLACE 1 TABLET UNDER TONGUE AS NEEDED FOR CHEST PAIN EVERY 5 MINUTES X 3 MAX DOSES.  CALL 911 IF PAIN PERSISTS (Patient not taking: Reported on 01/22/2020), Disp: 25 tablet, Rfl: 3     Review of Systems  Per HPI unless specifically indicated above     Objective:    BP (!) 162/87   LMP 07/18/2011   Wt Readings from Last 3 Encounters:  10/31/19 200 lb (90.7 kg)  10/16/19 220 lb (99.8 kg)  05/15/19 192 lb 6.4 oz (87.3 kg)    Physical Exam Pulmonary:     Effort: No respiratory distress.  Neurological:     Mental Status: She is alert and oriented to person, place, and time.  Psychiatric:        Attention and Perception: Attention normal.        Speech: Speech normal.        Behavior: Behavior is cooperative.     Results for orders placed or performed during the hospital encounter of 01/19/20  Basic metabolic panel  Result Value Ref Range   Sodium 141 135 - 145 mmol/L   Potassium 4.7 3.5 - 5.1 mmol/L   Chloride 105 98 - 111 mmol/L   CO2 26 22 - 32 mmol/L  Glucose, Bld 92 70 - 99 mg/dL   BUN 12 6 - 20 mg/dL   Creatinine, Ser 0.76 0.44 - 1.00 mg/dL   Calcium 8.9 8.9 - 10.3 mg/dL   GFR calc non Af Amer >60 >60 mL/min   GFR calc Af Amer >60 >60 mL/min   Anion gap 10 5 - 15      Assessment & Plan:   Encounter Diagnoses  Name Primary?  . Essential hypertension Yes  . Coronary artery disease involving native coronary artery of native heart without angina pectoris   . Bilateral carotid artery disease, unspecified type ([place])   . Tobacco use disorder   . Dyslipidemia      -reviewed labs with pt  -Will order maintenance inhaler -F/u 1 month in office to recheck.  Pt to contact office sooner prn

## 2020-01-23 ENCOUNTER — Other Ambulatory Visit: Payer: Self-pay | Admitting: Physician Assistant

## 2020-01-23 MED ORDER — ADVAIR HFA 115-21 MCG/ACT IN AERO: 2.0000 | INHALATION_SPRAY | Freq: Two times a day (BID) | RESPIRATORY_TRACT | 1 refills | Status: DC

## 2020-01-25 ENCOUNTER — Other Ambulatory Visit: Payer: Self-pay | Admitting: Physician Assistant

## 2020-01-25 MED ORDER — ADVAIR HFA 115-21 MCG/ACT IN AERO: 2.0000 | INHALATION_SPRAY | Freq: Two times a day (BID) | RESPIRATORY_TRACT | 1 refills | Status: DC

## 2020-01-30 ENCOUNTER — Other Ambulatory Visit: Payer: Self-pay | Admitting: Physician Assistant

## 2020-01-30 MED ORDER — ADVAIR HFA 115-21 MCG/ACT IN AERO: 2.0000 | INHALATION_SPRAY | Freq: Two times a day (BID) | RESPIRATORY_TRACT | 1 refills | Status: DC

## 2020-02-05 ENCOUNTER — Other Ambulatory Visit: Payer: Self-pay | Admitting: Physician Assistant

## 2020-02-05 MED ORDER — ADVAIR HFA 115-21 MCG/ACT IN AERO: 2.0000 | INHALATION_SPRAY | Freq: Two times a day (BID) | RESPIRATORY_TRACT | 1 refills | Status: DC

## 2020-02-12 ENCOUNTER — Ambulatory Visit: Payer: Self-pay | Admitting: Physician Assistant

## 2020-02-12 ENCOUNTER — Encounter: Payer: Self-pay | Admitting: Physician Assistant

## 2020-02-12 VITALS — BP 156/108 | HR 54 | Temp 98.6°F | Wt 200.8 lb

## 2020-02-12 DIAGNOSIS — F1290 Cannabis use, unspecified, uncomplicated: Secondary | ICD-10-CM

## 2020-02-12 DIAGNOSIS — F17200 Nicotine dependence, unspecified, uncomplicated: Secondary | ICD-10-CM

## 2020-02-12 DIAGNOSIS — Z5320 Procedure and treatment not carried out because of patient's decision for unspecified reasons: Secondary | ICD-10-CM

## 2020-02-12 DIAGNOSIS — Z2821 Immunization not carried out because of patient refusal: Secondary | ICD-10-CM

## 2020-02-12 DIAGNOSIS — E785 Hyperlipidemia, unspecified: Secondary | ICD-10-CM

## 2020-02-12 DIAGNOSIS — J449 Chronic obstructive pulmonary disease, unspecified: Secondary | ICD-10-CM

## 2020-02-12 DIAGNOSIS — I10 Essential (primary) hypertension: Secondary | ICD-10-CM

## 2020-02-12 DIAGNOSIS — I2510 Atherosclerotic heart disease of native coronary artery without angina pectoris: Secondary | ICD-10-CM

## 2020-02-12 DIAGNOSIS — I779 Disorder of arteries and arterioles, unspecified: Secondary | ICD-10-CM

## 2020-02-12 DIAGNOSIS — I714 Abdominal aortic aneurysm, without rupture, unspecified: Secondary | ICD-10-CM

## 2020-02-12 MED ORDER — AMLODIPINE BESYLATE 10 MG PO TABS: 10.0000 mg | ORAL_TABLET | Freq: Every day | ORAL | 1 refills | Status: DC

## 2020-02-12 MED ORDER — CLONIDINE HCL 0.3 MG PO TABS: 0.3000 mg | ORAL_TABLET | Freq: Two times a day (BID) | ORAL | 1 refills | Status: DC

## 2020-02-12 MED ORDER — ATORVASTATIN CALCIUM 80 MG PO TABS: ORAL_TABLET | ORAL | 1 refills | Status: DC

## 2020-02-12 MED ORDER — FLUTICASONE-SALMETEROL 100-50 MCG/DOSE IN AEPB: 1.0000 | INHALATION_SPRAY | Freq: Two times a day (BID) | RESPIRATORY_TRACT | 1 refills | Status: DC

## 2020-02-12 MED ORDER — ALBUTEROL SULFATE HFA 108 (90 BASE) MCG/ACT IN AERS: INHALATION_SPRAY | RESPIRATORY_TRACT | 1 refills | Status: DC

## 2020-02-12 MED ORDER — LISINOPRIL 40 MG PO TABS: 40.0000 mg | ORAL_TABLET | Freq: Every day | ORAL | 1 refills | Status: DC

## 2020-02-12 NOTE — Patient Instructions (Signed)
Fluticasone; Salmeterol inhalation powder What is this medicine? FLUTICASONE; SALMETEROL (floo TIK a sone; sal ME te role) inhalation is a combination of two medicines that decrease inflammation and help to open up the airways of your lungs. It is used to treat COPD. This medicine is also used to treat asthma. Do NOT use for an acute asthma attack. Do NOT use for a COPD attack. This medicine may be used for other purposes; ask your health care provider or pharmacist if you have questions. COMMON BRAND NAME(S): Advair, AirDuo Digihaler, Airduo RespiClick What should I tell my health care provider before I take this medicine? They need to know if you have any of these conditions:  bone problems  diabetes  eye disease, vision problems  immune system problems  heart disease or irregular heartbeat  high blood pressure  infection  pheochromocytoma  seizures  thyroid disease  worsening asthma  an unusual or allergic reaction to fluticasone; salmeterol, other corticosteroids, other medicines, foods, dyes, or preservatives  pregnant or trying to get pregnant  breast-feeding How should I use this medicine? This medicine is inhaled through the mouth. Rinse your mouth with water after use. Make sure not to swallow the water. Follow the directions on the prescription label. Do not use a spacer device with this inhaler. Take your medicine at regular intervals. Do not take your medicine more often than directed. Do not stop taking except on your doctor's advice. Make sure that you are using your inhaler correctly. Ask you doctor or health care provider if you have any questions. A special MedGuide will be given to you by the pharmacist with each prescription and refill. Be sure to read this information carefully each time. Talk to your pediatrician regarding the use of this medicine in children. Special care may be needed. Overdosage: If you think you have taken too much of this medicine  contact a poison control center or emergency room at once. NOTE: This medicine is only for you. Do not share this medicine with others. What if I miss a dose? If you miss a dose, use it as soon as you remember. If it is almost time for your next dose, use only that dose and continue with your regular schedule, spacing doses evenly. Do not use double or extra doses. What may interact with this medicine? Do not take this medicine with any of the following medications:  MAOIs like Carbex, Eldepryl, Marplan, Nardil, and Parnate This medicine may also interact with the following medications:  aminophylline or theophylline  antiviral medicines for HIV or AIDS  beta-blockers like metoprolol and propranolol  certain antibiotics like clarithromycin, erythromycin, levofloxacin, linezolid, and telithromycin  certain medicines for fungal infections like ketoconazole, itraconazole, posaconazole, voriconazole  conivaptan  diuretics  medicines for colds  medicines for depression or emotional conditions  nefazodone  vaccines This list may not describe all possible interactions. Give your health care provider a list of all the medicines, herbs, non-prescription drugs, or dietary supplements you use. Also tell them if you smoke, drink alcohol, or use illegal drugs. Some items may interact with your medicine. What should I watch for while using this medicine? Visit your doctor for regular check ups. Tell your doctor or health care professional if your symptoms do not get better. Do not use this medicine more than every 12 hours. NEVER use this medicine for an acute asthma attack. You should use your short-acting rescue inhalers for this purpose. If your symptoms get worse or if you need   your short-acting inhalers more often, call your doctor right away. If you are going to have surgery tell your doctor or health care professional that you are using this medicine. Try not to come in contact with  people with the chicken pox or measles. If you do, call your doctor. This medicine may increase blood sugar. Ask your healthcare provider if changes in diet or medicines are needed if you have diabetes. What side effects may I notice from receiving this medicine? Side effects that you should report to your doctor or health care professional as soon as possible:  allergic reactions like skin rash or hives, swelling of the face, lips, or tongue  breathing problems right after inhaling your medicine  chest pain  fast, irregular heartbeat  feeling faint or lightheaded, falls  fever or chills  nausea, vomiting  signs and symptoms of high blood sugar such as being more thirsty or hungry or having to urinate more than normal. You may also feel very tired or have blurry vision. Side effects that usually do not require medical attention (report to your doctor or health care professional if they continue or are bothersome):  cough  headache  nervousness  sore throat  stuffy nose  tremor This list may not describe all possible side effects. Call your doctor for medical advice about side effects. You may report side effects to FDA at 1-800-FDA-1088. Where should I keep my medicine? Keep out of the reach of children. Advair: Store at room temperature between 68 and 77 degrees F (20 and 25 degrees C). Do not leave your medicine in the heat or sun. Throw away 1 month after you open the package or whenever the dose indicator reads 0, whichever comes first. Throw away unopened packages after the expiration date. Airduo Respiclick: Store at room temperature between 59 and 86 degrees F (15 and 30 degrees C). Avoid exposure to extreme heat, cold, or humidity. Throw away 1 month after you open the package or whenever the dose indicator reads 0, whichever comes first. Throw away unopened packages after the expiration date. NOTE: This sheet is a summary. It may not cover all possible information. If  you have questions about this medicine, talk to your doctor, pharmacist, or health care provider.  2020 Elsevier/Gold Standard (2018-06-15 11:26:46)  

## 2020-02-12 NOTE — Progress Notes (Signed)
BP (!) 170/120   Pulse (!) 54   Temp 98.6 F (37 C)   Wt 200 lb 12.8 oz (91.1 kg)   LMP 07/18/2011   SpO2 96%   BMI 35.57 kg/m    Subjective:    Patient ID: [name], female    DOB: [birth year], 59 y.o.   MRN: [id number]  HPI: [name] is a 59 y.o. female presenting on 02/12/2020 for Hypertension   HPI   Pt had a negative covid 19 screening questionnaire.  Pt is a 59yoF with history of difficult to control HTN.   Pt did not bring her meds with her today but is very insistent that she is taking all of her meds.  She monitors her BP at home.  At home bp "156/80-something" yesterday.  She Denies cocaine usage which has contributed to her HTN in the past.  She says she Still smokes Marijuana.  She also smokes cigarettes.    She says she is feeling well and denies any CP, HA, visual changes or dizziness.  She is Not getting covid vaccination  Pt says she needs to go to Mental Health but she just doesn't want to go to another doctor.  She denies SI, HI.       Relevant past medical, surgical, family and social history reviewed and updated as indicated. Interim medical history since our last visit reviewed. Allergies and medications reviewed and updated.   Current Outpatient Medications:  .  albuterol (PROVENTIL HFA) 108 (90 Base) MCG/ACT inhaler, INHALE 2 PUFFS BY MOUTH EVERY 6 HOURS AS NEEDED FOR COUGHING, WHEEZING, OR SHORTNESS OF BREATH, Disp: 20.1 g, Rfl: 1 .  amLODipine (NORVASC) 10 MG tablet, TAKE 1 Tablet BY MOUTH EVERY DAY, Disp: 90 tablet, Rfl: 1 .  aspirin EC 81 MG tablet, Take 1 tablet (81 mg total) by mouth daily., Disp: 90 tablet, Rfl: 3 .  atorvastatin (LIPITOR) 80 MG tablet, TAKE 1 Tablet BY MOUTH EVERY DAY AT 6PM, Disp: 90 tablet, Rfl: 1 .  cloNIDine (CATAPRES) 0.2 MG tablet, TAKE 1 Tablet  BY MOUTH TWICE DAILY, Disp: 180 tablet, Rfl: 1 .  furosemide (LASIX) 20 MG tablet, Take 1 tablet (20 mg total) by mouth daily., Disp: 90 tablet, Rfl: 3 .   lisinopril (ZESTRIL) 40 MG tablet, TAKE 1 Tablet BY MOUTH EVERY DAY, Disp: 90 tablet, Rfl: 1 .  metoprolol tartrate (LOPRESSOR) 100 MG tablet, TAKE 2 Tablets BY MOUTH TWICE DAILY, Disp: 360 tablet, Rfl: 1 .  nitroGLYCERIN (NITROSTAT) 0.4 MG SL tablet, PLACE 1 TABLET UNDER TONGUE AS NEEDED FOR CHEST PAIN EVERY 5 MINUTES X 3 MAX DOSES.  CALL 911 IF PAIN PERSISTS, Disp: 25 tablet, Rfl: 3 .  fluticasone-salmeterol (ADVAIR HFA) 115-21 MCG/ACT inhaler, Inhale 2 puffs into the lungs 2 (two) times daily. (Patient not taking: Reported on 02/12/2020), Disp: 3 Inhaler, Rfl: 1     Review of Systems  Per HPI unless specifically indicated above     Objective:    BP (!) 170/120   Pulse (!) 54   Temp 98.6 F (37 C)   Wt 200 lb 12.8 oz (91.1 kg)   LMP 07/18/2011   SpO2 96%   BMI 35.57 kg/m   Wt Readings from Last 3 Encounters:  02/12/20 200 lb 12.8 oz (91.1 kg)  10/31/19 200 lb (90.7 kg)  10/16/19 220 lb (99.8 kg)    Physical Exam Vitals reviewed.  Constitutional:      General: She is not in acute distress.  Appearance: She is well-developed. She is not ill-appearing.  HENT:     Head: Normocephalic and atraumatic.  Cardiovascular:     Rate and Rhythm: Normal rate and regular rhythm.  Pulmonary:     Effort: Pulmonary effort is normal.     Breath sounds: Normal breath sounds.  Abdominal:     General: Bowel sounds are normal.     Palpations: Abdomen is soft. There is no mass.     Tenderness: There is no abdominal tenderness.  Musculoskeletal:     Cervical back: Neck supple.     Right lower leg: No edema.     Left lower leg: No edema.  Lymphadenopathy:     Cervical: No cervical adenopathy.  Skin:    General: Skin is warm and dry.  Neurological:     Mental Status: She is alert and oriented to person, place, and time.  Psychiatric:        Attention and Perception: Attention normal.        Speech: Speech normal.        Behavior: Behavior normal. Behavior is cooperative.      Comments: Pt is her usual pleasant self.             Assessment & Plan:    Encounter Diagnoses  Name Primary?  . Essential hypertension Yes  . Coronary artery disease involving native coronary artery of native heart without angina pectoris   . Bilateral carotid artery disease, unspecified type ([place])   . Tobacco use disorder   . Dyslipidemia   . COVID-19 virus vaccination declined   . Screening mammography declined   . Colon cancer screening declined   . Marijuana use   . AAA (abdominal aortic aneurysm) without rupture ([place])   . Chronic obstructive pulmonary disease, unspecified COPD type ([place])        -pt will be due for repeat US in October to re-evaluate her AAA   -pt Declined covid vaccination  -pt Declined mammogram  -pt Declined ifobt for colon cancer screening  -will Increase clonidine to 0.3mg  bid (up from 0.2mg ).  She is to continue her other medications.  Encouraged pt to review her medication list with her bottles at home to make sure she isn't missing anything.  -pt was on dulera but that is no longer available through [facility] so she will be given rx for advair.  Reviewed proper way to use the discus and watched instructional video and gave reading materials  -pt to follow up 1 month.  She is to contact office sooner for any issues

## 2020-02-28 ENCOUNTER — Other Ambulatory Visit: Payer: Self-pay | Admitting: Physician Assistant

## 2020-03-11 ENCOUNTER — Ambulatory Visit: Payer: Self-pay | Admitting: Physician Assistant

## 2020-03-12 ENCOUNTER — Emergency Department (HOSPITAL_COMMUNITY): Payer: Self-pay

## 2020-03-12 ENCOUNTER — Encounter (HOSPITAL_COMMUNITY): Payer: Self-pay | Admitting: Emergency Medicine

## 2020-03-12 ENCOUNTER — Other Ambulatory Visit: Payer: Self-pay

## 2020-03-12 ENCOUNTER — Emergency Department (HOSPITAL_COMMUNITY)
Admission: EM | Admit: 2020-03-12 | Discharge: 2020-03-13 | Disposition: A | Discharge status: Home / Self Care | Payer: Self-pay | Attending: Emergency Medicine | Admitting: Emergency Medicine

## 2020-03-12 DIAGNOSIS — F17210 Nicotine dependence, cigarettes, uncomplicated: Secondary | ICD-10-CM | POA: Insufficient documentation

## 2020-03-12 DIAGNOSIS — Z880 Allergy status to penicillin: Secondary | ICD-10-CM | POA: Insufficient documentation

## 2020-03-12 DIAGNOSIS — I2510 Atherosclerotic heart disease of native coronary artery without angina pectoris: Secondary | ICD-10-CM | POA: Insufficient documentation

## 2020-03-12 DIAGNOSIS — Y999 Unspecified external cause status: Secondary | ICD-10-CM | POA: Insufficient documentation

## 2020-03-12 DIAGNOSIS — I10 Essential (primary) hypertension: Secondary | ICD-10-CM | POA: Insufficient documentation

## 2020-03-12 DIAGNOSIS — Y939 Activity, unspecified: Secondary | ICD-10-CM | POA: Insufficient documentation

## 2020-03-12 DIAGNOSIS — Y929 Unspecified place or not applicable: Secondary | ICD-10-CM | POA: Insufficient documentation

## 2020-03-12 DIAGNOSIS — Z79899 Other long term (current) drug therapy: Secondary | ICD-10-CM | POA: Insufficient documentation

## 2020-03-12 DIAGNOSIS — F1290 Cannabis use, unspecified, uncomplicated: Secondary | ICD-10-CM | POA: Insufficient documentation

## 2020-03-12 DIAGNOSIS — X58XXXA Exposure to other specified factors, initial encounter: Secondary | ICD-10-CM | POA: Insufficient documentation

## 2020-03-12 DIAGNOSIS — R55 Syncope and collapse: Secondary | ICD-10-CM | POA: Insufficient documentation

## 2020-03-12 LAB — BASIC METABOLIC PANEL
Anion gap: 11 (ref 5–15)
BUN: 22 mg/dL — ABNORMAL HIGH (ref 6–20)
CO2: 24 mmol/L (ref 22–32)
Calcium: 8.8 mg/dL — ABNORMAL LOW (ref 8.9–10.3)
Chloride: 103 mmol/L (ref 98–111)
Creatinine, Ser: 1.12 mg/dL — ABNORMAL HIGH (ref 0.44–1.00)
GFR calc Af Amer: >60 mL/min (ref >60)
GFR calc non Af Amer: 54 mL/min — ABNORMAL LOW (ref >60)
Glucose, Bld: 99 mg/dL (ref 70–99)
Potassium: 4.1 mmol/L (ref 3.5–5.1)
Sodium: 138 mmol/L (ref 135–145)

## 2020-03-12 LAB — CBC
HCT: 41.8 % (ref 36.0–46.0)
Hemoglobin: 13.2 g/dL (ref 12.0–15.0)
MCH: 30 pg (ref 26.0–34.0)
MCHC: 31.6 g/dL (ref 30.0–36.0)
MCV: 95 fL (ref 80.0–100.0)
Platelets: 283 10*3/uL (ref 150–400)
RBC: 4.4 MIL/uL (ref 3.87–5.11)
RDW: 13.2 % (ref 11.5–15.5)
WBC: 8.2 10*3/uL (ref 4.0–10.5)
nRBC: 0 % (ref 0.0–0.2)

## 2020-03-12 MED ORDER — HYDROMORPHONE HCL 1 MG/ML IJ SOLN
1.0000 mg | Freq: Once | INTRAMUSCULAR | Status: AC
  Administered 2020-03-13: 1 mg via INTRAMUSCULAR
  Filled 2020-03-12: qty 1

## 2020-03-12 MED ORDER — HYDROCODONE-ACETAMINOPHEN 5-325 MG PO TABS: 1.0000 | ORAL_TABLET | Freq: Four times a day (QID) | ORAL | 0 refills | Status: DC | PRN

## 2020-03-12 NOTE — ED Triage Notes (Signed)
Patient complains of syncope last night with a fall on her left side after inhaling clorox. Patient has pain in her left abdominal and flank area as well as her left leg.

## 2020-03-12 NOTE — ED Provider Notes (Signed)
[facility] EMERGENCY DEPARTMENT Provider Note   CSN: [id number] Arrival date & time: 03/12/20  1622     History Chief Complaint  Patient presents with  . Loss of Consciousness    [name] is a 59 y.o. female.  Patient states she smells with Clorox and passed out and she has back and left knee pain  The history is provided by the patient. No language interpreter was used.  Fall This is a new problem. The current episode started 12 to 24 hours ago. The problem occurs rarely. The problem has been resolved. Pertinent negatives include no chest pain, no abdominal pain and no headaches. Nothing aggravates the symptoms. Nothing relieves the symptoms. She has tried nothing for the symptoms.       Past Medical History:  Diagnosis Date  . AAA (abdominal aortic aneurysm) without rupture ([place]) 08/25/2016  . Anxiety   . Arthritis   . Bipolar 1 disorder ([place])   . CAD (coronary artery disease)    a. 08/08/16 LAD 70%, 60% 1st diag, CTO RCA Med Rx EF 55%  . Carotid artery stenosis    Right carotid endarterectomy 07/22/05 with recurrent stenosis 01/2017; known left ICA occlusion  . Crack cocaine use   . Depression   . Headache   . Hypertension   . Myocardial infarction ([place])   . Right Groin Hematoma    a. 07/2016 following diagnostic cath  . Stroke [facility]) 2017    Patient Active Problem List   Diagnosis Date Noted  . Carotid stenosis 02/08/2018  . AAA (abdominal aortic aneurysm) without rupture ([place]) 08/25/2016  . Carotid disease, bilateral ([place]) 08/18/2016  . Cardiomyopathy, ischemic 08/18/2016  . CAD (coronary artery disease) 08/08/2016  . Acute blood loss anemia 08/08/2016  . Dyslipidemia 08/08/2016  . Bipolar 1 disorder ([place])   . Essential hypertension   . Anxiety   . Crack cocaine use   . Tobacco abuse   . Right Groin Hematoma   . Chest pain 08/07/2016  . Unstable angina pectoris ([place]) 08/06/2016    Past Surgical History:  Procedure Laterality Date  .  CARDIAC CATHETERIZATION N/A 08/07/2016   Procedure: Left Heart Cath and Coronary Angiography;  Surgeon: Troy Sine, MD;  Location: [place] [facility];  Service: Cardiovascular;  Laterality: N/A;  . CYST REMOVAL TRUNK Left    beneath L breast  . ENDARTERECTOMY Right 02/08/2018   Procedure: ENDARTERECTOMY CAROTID REDO;  Surgeon: Angelia Mould, MD;  Location: [place];  Service: Vascular;  Laterality: Right;  . TUBAL LIGATION    . VASCULAR SURGERY Right    right carotid endarterectomy 07/22/05      OB History    Gravida  4   Para  4   Term  3   Preterm  1   AB      Living        SAB      TAB      Ectopic      Multiple      Live Births              Family History  Problem Relation Age of Onset  . Heart attack Mother   . Stroke Mother   . Hypertension Mother   . Diabetes Mother   . Heart disease Mother   . Heart attack Father   . Hypertension Father   . Heart disease Father   . Cancer Maternal Aunt   . Cancer Maternal Uncle   .  COPD Paternal Uncle        2 pat uncles with COPD  . Cancer Paternal Uncle        1 pat uncle with cancer    Social History   Tobacco Use  . Smoking status: Current Every Day Smoker    Packs/day: 0.50    Years: 42.00    Pack years: 21.00    Types: Cigarettes    Start date: 10/05/1974  . Smokeless tobacco: Never Used  Vaping Use  . Vaping Use: Never used  Substance Use Topics  . Alcohol use: No  . Drug use: Yes    Frequency: 2.0 times per week    Types: Marijuana, "Crack" cocaine    Comment: [email or web address]     Home Medications Prior to Admission medications   Medication Sig Start Date End Date Taking? Authorizing Provider  albuterol (PROVENTIL HFA) 108 (90 Base) MCG/ACT inhaler INHALE 2 PUFFS BY MOUTH EVERY 6 HOURS AS NEEDED FOR COUGHING, WHEEZING, OR SHORTNESS OF BREATH 02/12/20   Jacquelin Hawking, PA-C  amLODipine (NORVASC) 10 MG tablet Take 1 tablet (10 mg total) by mouth daily. 02/12/20   Jacquelin Hawking, PA-C    aspirin EC 81 MG tablet Take 1 tablet (81 mg total) by mouth daily. 06/10/18   Jonelle Sidle, MD  atorvastatin (LIPITOR) 80 MG tablet TAKE 1 Tablet BY MOUTH EVERY DAY AT 6PM 02/12/20   Jacquelin Hawking, PA-C  cloNIDine (CATAPRES) 0.3 MG tablet Take 1 tablet (0.3 mg total) by mouth 2 (two) times daily. 02/12/20   Jacquelin Hawking, PA-C  Fluticasone-Salmeterol (ADVAIR DISKUS) 100-50 MCG/DOSE AEPB Inhale 1 puff into the lungs in the morning and at bedtime. 02/12/20   Jacquelin Hawking, PA-C  furosemide (LASIX) 20 MG tablet Take 1 tablet (20 mg total) by mouth daily. 10/31/19 02/12/20  Jonelle Sidle, MD  HYDROcodone-acetaminophen (NORCO/VICODIN) 5-325 MG tablet Take 1 tablet by mouth every 6 (six) hours as needed. 03/12/20   Bethann Berkshire, MD  lisinopril (ZESTRIL) 40 MG tablet Take 1 tablet (40 mg total) by mouth daily. 02/12/20   Jacquelin Hawking, PA-C  metoprolol tartrate (LOPRESSOR) 100 MG tablet TAKE 2 Tablets BY MOUTH TWICE DAILY 02/28/20   Jacquelin Hawking, PA-C  nitroGLYCERIN (NITROSTAT) 0.4 MG SL tablet PLACE 1 TABLET UNDER TONGUE AS NEEDED FOR CHEST PAIN EVERY 5 MINUTES X 3 MAX DOSES.  CALL 911 IF PAIN PERSISTS 02/07/19   Jacquelin Hawking, PA-C    Allergies    Penicillins  Review of Systems   Review of Systems  Constitutional: Negative for appetite change and fatigue.  HENT: Negative for congestion, ear discharge and sinus pressure.   Eyes: Negative for discharge.  Respiratory: Negative for cough.   Cardiovascular: Negative for chest pain.  Gastrointestinal: Negative for abdominal pain and diarrhea.  Genitourinary: Negative for frequency and hematuria.  Musculoskeletal: Negative for back pain.       Left knee pain  Skin: Negative for rash.  Neurological: Negative for seizures and headaches.  Psychiatric/Behavioral: Negative for hallucinations.    Physical Exam Updated Vital Signs BP (!) 146/72   Pulse 65   Temp 98.4 F (36.9 C) (Oral)   Resp 18   Ht 5\' 3"  (1.6 m)   Wt 90.7  kg   LMP 07/18/2011   SpO2 98%   BMI 35.43 kg/m   Physical Exam Vitals and nursing note reviewed.  Constitutional:      Appearance: She is well-developed.  HENT:     Head: Normocephalic.  Nose: Nose normal.  Eyes:     General: No scleral icterus.    Conjunctiva/sclera: Conjunctivae normal.  Neck:     Thyroid: No thyromegaly.  Cardiovascular:     Rate and Rhythm: Normal rate and regular rhythm.     Heart sounds: No murmur heard.  No friction rub. No gallop.   Pulmonary:     Breath sounds: No stridor. No wheezing or rales.  Chest:     Chest wall: No tenderness.  Abdominal:     General: There is no distension.     Tenderness: There is no abdominal tenderness. There is no rebound.  Musculoskeletal:     Cervical back: Neck supple.     Comments: Tender left knee and tender lumbar spine  Lymphadenopathy:     Cervical: No cervical adenopathy.  Skin:    Findings: No erythema or rash.  Neurological:     Mental Status: She is alert and oriented to person, place, and time.     Motor: No abnormal muscle tone.     Coordination: Coordination normal.  Psychiatric:        Behavior: Behavior normal.     ED Results / Procedures / Treatments   Labs (all labs ordered are listed, but only abnormal results are displayed) Labs Reviewed  BASIC METABOLIC PANEL - Abnormal; Notable for the following components:      Result Value   BUN 22 (*)    Creatinine, Ser 1.12 (*)    Calcium 8.8 (*)    GFR calc non Af Amer 54 (*)    All other components within normal limits  CBC    EKG EKG Interpretation  Date/Time:  Tuesday March 12 2020 17:04:17 EDT Ventricular Rate:  48 PR Interval:  184 QRS Duration: 80 QT Interval:  456 QTC Calculation: 407 R Axis:   14 Text Interpretation: Sinus bradycardia Inferior infarct , age undetermined Anterior infarct , age undetermined Abnormal ECG Confirmed by Bethann Berkshire [phone]) on 03/12/2020 10:11:18 PM   Radiology DG Lumbar Spine  Complete  Result Date: 03/12/2020 CLINICAL DATA:  Larey Seat yesterday, left leg pain, history of abdominal aortic aneurysm EXAM: LUMBAR SPINE - COMPLETE 4+ VIEW COMPARISON:  07/10/2019 FINDINGS: Frontal, bilateral oblique, and lateral views of the lumbar spine are obtained. There are 5 non-rib-bearing lumbar type vertebral bodies, with minimal grade 1 anterolisthesis of L4 on L5. Otherwise alignment is anatomic. There is mild spondylosis at L2-3 and L5-S1. Prominent facet hypertrophy at L4-5 and L5-S1. No acute fractures. Sacroiliac joints are normal. Infrarenal abdominal aortic aneurysm is noted, grossly stable. IMPRESSION: 1. Mild spondylosis and facet hypertrophy.  No acute fracture. Electronically Signed   By: Sharlet Salina M.D.   On: 03/12/2020 23:00   CT HEAD WO CONTRAST  Result Date: 03/12/2020 CLINICAL DATA:  Syncopal episode last night with a fall and blow to the left side of the head. Initial encounter. EXAM: CT HEAD WITHOUT CONTRAST TECHNIQUE: Contiguous axial images were obtained from the base of the skull through the vertex without intravenous contrast. COMPARISON:  Head CT 10/21/2013. FINDINGS: Brain: No evidence of acute infarction, hemorrhage, hydrocephalus, extra-axial collection or mass lesion/mass effect. Remote left frontal infarct again seen. Chronic microvascular ischemic disease has worsened since the prior CT. Vascular: Extensive atherosclerosis. Skull: Intact.  No focal lesion. Sinuses/Orbits: Negative. Other: None. IMPRESSION: No acute abnormality. Remote left frontal infarct. Chronic microvascular ischemic change has worsened since the comparison CT. Electronically Signed   By: Drusilla Kanner M.D.   On: 03/12/2020 18:33  DG Knee Complete 4 Views Left  Result Date: 03/12/2020 CLINICAL DATA:  Fall EXAM: LEFT KNEE - COMPLETE 4+ VIEW COMPARISON:  None. FINDINGS: No fracture or malalignment. Small knee effusion. Vascular calcification. Moderate patellofemoral and medial joint space  degenerative change with mild lateral joint space degenerative change. IMPRESSION: 1. No acute osseous abnormality 2. Tricompartment arthritis with small knee effusion Electronically Signed   By: Jasmine Pang M.D.   On: 03/12/2020 18:48   DG Femur Min 2 Views Left  Result Date: 03/12/2020 CLINICAL DATA:  Fall EXAM: LEFT FEMUR 2 VIEWS COMPARISON:  None. FINDINGS: There is no evidence of fracture or other focal bone lesions. Soft tissues are unremarkable. Vascular calcifications in the soft tissues. Small knee effusion. IMPRESSION: No acute osseous abnormality Electronically Signed   By: Jasmine Pang M.D.   On: 03/12/2020 18:48    Procedures Procedures (including critical care time)  Medications Ordered in ED Medications  HYDROmorphone (DILAUDID) injection 1 mg (has no administration in time range)    ED Course  I have reviewed the triage vital signs and the nursing notes.  Pertinent labs & imaging results that were available during my care of the patient were reviewed by me and considered in my medical decision making (see chart for details).    MDM Rules/Calculators/A&P                          Patient with severe contusion and arthritis to left knee.  She is placed on Vicodin will use a walker to ambulate with.  Patient with syncopal episode normal labs.  Possibly related to inhaling Clorox.         This patient presents to the ED for concern of syncope, this involves an extensive number of treatment options, and is a complaint that carries with it a high risk of complications and morbidity.  The differential diagnosis includes stroke inhalation injury   Lab Tests:   I Ordered, reviewed, and interpreted labs, which included CBC chemistries which showed mild low calcium  Medicines ordered:  I ordered medication hydrocodone for pain Imaging Studies ordered:   I ordered imaging studies which included CT head left knee x-ray lumbar spine and  I independently visualized  and interpreted imaging which showed unremarkable  Additional history obtained:   Additional history obtained from records  Previous records obtained and reviewed   Consultations Obtained:     Reevaluation:  After the interventions stated above, I reevaluated the patient and found mildly improved  Critical Interventions:  .   Final Clinical Impression(s) / ED Diagnoses Final diagnoses:  Syncope and collapse    Rx / DC Orders ED Discharge Orders         Ordered    HYDROcodone-acetaminophen (NORCO/VICODIN) 5-325 MG tablet  Every 6 hours PRN     Discontinue  Reprint     03/12/20 2338           Bethann Berkshire, MD 03/13/20 1101

## 2020-03-12 NOTE — Discharge Instructions (Addendum)
Follow-up with Dr. Romeo Apple for your knee.  And follow-up with your family doctor for any other pain.  Use a walker to ambulate with.  Try to stay off your leg is much as possible

## 2020-04-04 ENCOUNTER — Ambulatory Visit: Payer: Self-pay | Admitting: Physician Assistant

## 2020-04-04 ENCOUNTER — Encounter: Payer: Self-pay | Admitting: Physician Assistant

## 2020-04-04 VITALS — BP 104/74 | HR 56 | Temp 97.1°F | Ht 62.25 in | Wt 194.0 lb

## 2020-04-04 DIAGNOSIS — I714 Abdominal aortic aneurysm, without rupture, unspecified: Secondary | ICD-10-CM

## 2020-04-04 DIAGNOSIS — Z5320 Procedure and treatment not carried out because of patient's decision for unspecified reasons: Secondary | ICD-10-CM

## 2020-04-04 DIAGNOSIS — I10 Essential (primary) hypertension: Secondary | ICD-10-CM

## 2020-04-04 DIAGNOSIS — F17200 Nicotine dependence, unspecified, uncomplicated: Secondary | ICD-10-CM

## 2020-04-04 DIAGNOSIS — Z2821 Immunization not carried out because of patient refusal: Secondary | ICD-10-CM

## 2020-04-04 NOTE — Progress Notes (Signed)
BP 104/74   Pulse (!) 56   Temp (!) 97.1 F (36.2 C)   Ht 5' 2.25" (1.581 m)   Wt 194 lb (88 kg)   LMP 07/18/2011   SpO2 94%   BMI 35.20 kg/m    Subjective:    Patient ID: [name], female    DOB: [birth year], 59 y.o.   MRN: [id number]  HPI: [name] is a 59 y.o. female presenting on 04/04/2020 for Hypertension   HPI   Pt had a negative covid 19 screening questionnaire.   Pt is 59yoF with follow-up today for HTN.  She has not gotten covid vaccination and doesn't want one.  She has no complaints today.    Relevant past medical, surgical, family and social history reviewed and updated as indicated. Interim medical history since our last visit reviewed. Allergies and medications reviewed and updated.   Current Outpatient Medications:  .  albuterol (PROVENTIL HFA) 108 (90 Base) MCG/ACT inhaler, INHALE 2 PUFFS BY MOUTH EVERY 6 HOURS AS NEEDED FOR COUGHING, WHEEZING, OR SHORTNESS OF BREATH, Disp: 20.1 g, Rfl: 1 .  amLODipine (NORVASC) 10 MG tablet, Take 1 tablet (10 mg total) by mouth daily., Disp: 90 tablet, Rfl: 1 .  aspirin EC 81 MG tablet, Take 1 tablet (81 mg total) by mouth daily., Disp: 90 tablet, Rfl: 3 .  atorvastatin (LIPITOR) 80 MG tablet, TAKE 1 Tablet BY MOUTH EVERY DAY AT 6PM, Disp: 90 tablet, Rfl: 1 .  cloNIDine (CATAPRES) 0.3 MG tablet, Take 1 tablet (0.3 mg total) by mouth 2 (two) times daily., Disp: 180 tablet, Rfl: 1 .  lisinopril (ZESTRIL) 40 MG tablet, Take 1 tablet (40 mg total) by mouth daily., Disp: 90 tablet, Rfl: 1 .  metoprolol tartrate (LOPRESSOR) 100 MG tablet, TAKE 2 Tablets BY MOUTH TWICE DAILY, Disp: 360 tablet, Rfl: 1 .  nitroGLYCERIN (NITROSTAT) 0.4 MG SL tablet, PLACE 1 TABLET UNDER TONGUE AS NEEDED FOR CHEST PAIN EVERY 5 MINUTES X 3 MAX DOSES.  CALL 911 IF PAIN PERSISTS, Disp: 25 tablet, Rfl: 3 .  Fluticasone-Salmeterol (ADVAIR DISKUS) 100-50 MCG/DOSE AEPB, Inhale 1 puff into the lungs in the morning and at bedtime. (Patient not  taking: Reported on 04/04/2020), Disp: 60 each, Rfl: 1 .  furosemide (LASIX) 20 MG tablet, Take 1 tablet (20 mg total) by mouth daily. (Patient not taking: Reported on 04/04/2020), Disp: 90 tablet, Rfl: 3 .  HYDROcodone-acetaminophen (NORCO/VICODIN) 5-325 MG tablet, Take 1 tablet by mouth every 6 (six) hours as needed. (Patient not taking: Reported on 04/04/2020), Disp: 20 tablet, Rfl: 0    Review of Systems  Per HPI unless specifically indicated above     Objective:    BP 104/74   Pulse (!) 56   Temp (!) 97.1 F (36.2 C)   Ht 5' 2.25" (1.581 m)   Wt 194 lb (88 kg)   LMP 07/18/2011   SpO2 94%   BMI 35.20 kg/m   Wt Readings from Last 3 Encounters:  04/04/20 194 lb (88 kg)  03/12/20 200 lb (90.7 kg)  02/12/20 200 lb 12.8 oz (91.1 kg)    Physical Exam Vitals reviewed.  Constitutional:      General: She is not in acute distress.    Appearance: She is well-developed. She is not toxic-appearing.  HENT:     Head: Normocephalic and atraumatic.  Cardiovascular:     Rate and Rhythm: Normal rate and regular rhythm.  Pulmonary:     Effort: Pulmonary effort is normal.  Breath sounds: Normal breath sounds.  Abdominal:     General: Bowel sounds are normal.     Palpations: Abdomen is soft. There is no mass.     Tenderness: There is no abdominal tenderness.  Musculoskeletal:     Cervical back: Neck supple.     Right lower leg: No edema.     Left lower leg: No edema.  Lymphadenopathy:     Cervical: No cervical adenopathy.  Skin:    General: Skin is warm and dry.  Neurological:     Mental Status: She is alert and oriented to person, place, and time.  Psychiatric:        Behavior: Behavior normal.          Assessment & Plan:   Encounter Diagnoses  Name Primary?  . Essential hypertension Yes  . COVID-19 virus vaccination declined   . Screening mammography declined   . Tobacco use disorder   . AAA (abdominal aortic aneurysm) without rupture ([facility])      -pt Declined  screening mammogram -pt doesn't want covid vaccination but discussed benefits and urged her to reconsider -she will be due for repeat US for AAA monitoring in October -pt to follow up 3 months.  She is to contact office sooner prn

## 2020-04-09 ENCOUNTER — Ambulatory Visit: Payer: Self-pay | Admitting: Physician Assistant

## 2020-04-25 ENCOUNTER — Other Ambulatory Visit: Payer: Self-pay | Admitting: Physician Assistant

## 2020-06-18 ENCOUNTER — Other Ambulatory Visit: Payer: Self-pay | Admitting: Physician Assistant

## 2020-06-18 DIAGNOSIS — I2510 Atherosclerotic heart disease of native coronary artery without angina pectoris: Secondary | ICD-10-CM

## 2020-06-18 DIAGNOSIS — I10 Essential (primary) hypertension: Secondary | ICD-10-CM

## 2020-06-18 DIAGNOSIS — E785 Hyperlipidemia, unspecified: Secondary | ICD-10-CM

## 2020-06-20 ENCOUNTER — Encounter: Payer: Self-pay | Admitting: Cardiology

## 2020-06-20 ENCOUNTER — Ambulatory Visit (INDEPENDENT_AMBULATORY_CARE_PROVIDER_SITE_OTHER): Payer: Self-pay | Admitting: Cardiology

## 2020-06-20 VITALS — BP 142/82 | HR 50 | Ht 63.0 in | Wt 200.0 lb

## 2020-06-20 DIAGNOSIS — E782 Mixed hyperlipidemia: Secondary | ICD-10-CM

## 2020-06-20 DIAGNOSIS — I25119 Atherosclerotic heart disease of native coronary artery with unspecified angina pectoris: Secondary | ICD-10-CM

## 2020-06-20 DIAGNOSIS — I779 Disorder of arteries and arterioles, unspecified: Secondary | ICD-10-CM

## 2020-06-20 NOTE — Progress Notes (Signed)
Cardiology Office Note  Date: 06/20/2020   ID: [name], DOB 09-Apr-1961, MRN [id number]  PCP:  Jacquelin Hawking, PA-C  Cardiologist:  Nona Dell, MD Electrophysiologist:  None   Chief Complaint  Patient presents with  . Cardiac follow-up    History of Present Illness: [name] is a 59 y.o. female last assessed via telehealth encounter in February.  She presents for a routine visit.  She does not report any angina symptoms at this time.  States that she has been under a lot of stress with her family.  I reviewed her medications which are outlined below.  She reports compliance.  Carotid Dopplers in April demonstrated chronic occlusion of the LICA with only mild stenosis of the RICA.  She is asymptomatic.  Past Medical History:  Diagnosis Date  . AAA (abdominal aortic aneurysm) without rupture (HCC) 08/25/2016  . Anxiety   . Arthritis   . Bipolar 1 disorder (HCC)   . CAD (coronary artery disease)    a. 08/08/16 LAD 70%, 60% 1st diag, CTO RCA Med Rx EF 55%  . Carotid artery stenosis    Right carotid endarterectomy 07/22/05 with recurrent stenosis 01/2017; known left ICA occlusion  . Crack cocaine use   . Depression   . Headache   . Hypertension   . Myocardial infarction (HCC)   . Right Groin Hematoma    a. 07/2016 following diagnostic cath  . Stroke [facility]) 2017    Past Surgical History:  Procedure Laterality Date  . CARDIAC CATHETERIZATION N/A 08/07/2016   Procedure: Left Heart Cath and Coronary Angiography;  Surgeon: Lennette Bihari, MD;  Location: [facility] INVASIVE CV LAB;  Service: Cardiovascular;  Laterality: N/A;  . CYST REMOVAL TRUNK Left    beneath L breast  . ENDARTERECTOMY Right 02/08/2018   Procedure: ENDARTERECTOMY CAROTID REDO;  Surgeon: Chuck Hint, MD;  Location: [facility] OR;  Service: Vascular;  Laterality: Right;  . TUBAL LIGATION    . VASCULAR SURGERY Right    right carotid endarterectomy 07/22/05     Current Outpatient  Medications  Medication Sig Dispense Refill  . albuterol (PROVENTIL HFA) 108 (90 Base) MCG/ACT inhaler INHALE 2 PUFFS BY MOUTH EVERY 6 HOURS AS NEEDED FOR COUGHING, WHEEZING, OR SHORTNESS OF BREATH 20.1 g 1  . amLODipine (NORVASC) 10 MG tablet Take 1 tablet (10 mg total) by mouth daily. 90 tablet 1  . aspirin EC 81 MG tablet Take 1 tablet (81 mg total) by mouth daily. 90 tablet 3  . atorvastatin (LIPITOR) 80 MG tablet TAKE 1 Tablet BY MOUTH EVERY DAY AT 6PM 90 tablet 1  . cloNIDine (CATAPRES) 0.2 MG tablet TAKE 1 Tablet  BY MOUTH TWICE DAILY 180 tablet 1  . Fluticasone-Salmeterol (ADVAIR) 100-50 MCG/DOSE AEPB INHALE 1 PUFF BY MOUTH TWICE DAILY. RINSE MOUTH AFTER EACH USE. (MORNING AND BEDTIME) 60 each 1  . furosemide (LASIX) 20 MG tablet Take 1 tablet (20 mg total) by mouth daily. 90 tablet 3  . HYDROcodone-acetaminophen (NORCO/VICODIN) 5-325 MG tablet Take 1 tablet by mouth every 6 (six) hours as needed. 20 tablet 0  . lisinopril (ZESTRIL) 40 MG tablet Take 1 tablet (40 mg total) by mouth daily. 90 tablet 1  . metoprolol tartrate (LOPRESSOR) 100 MG tablet TAKE 2 Tablets BY MOUTH TWICE DAILY 360 tablet 1  . nitroGLYCERIN (NITROSTAT) 0.4 MG SL tablet PLACE 1 TABLET UNDER TONGUE AS NEEDED FOR CHEST PAIN EVERY 5 MINUTES X 3 MAX DOSES.  CALL 911 IF PAIN  PERSISTS 25 tablet 3   No current facility-administered medications for this visit.   Allergies:  Penicillins   ROS:   No palpitations or syncope.  Physical Exam: VS:  BP (!) 142/82   Pulse (!) 50   Ht 5\' 3"  (1.6 m)   Wt 200 lb (90.7 kg)   LMP 07/18/2011   SpO2 94%   BMI 35.43 kg/m , BMI Body mass index is 35.43 kg/m.  Wt Readings from Last 3 Encounters:  06/20/20 200 lb (90.7 kg)  04/04/20 194 lb (88 kg)  03/12/20 200 lb (90.7 kg)    General: Patient appears comfortable at rest. HEENT: Conjunctiva and lids normal, wearing a mask. Neck: Supple, no elevated JVP, right CEA scar with soft bilateral bruits. Lungs: Clear to auscultation,  nonlabored breathing at rest. Cardiac: Regular rate and rhythm, no S3, 2/6 systolic murmur. Extremities: No pitting edema, distal pulses 2+.  ECG:  An ECG dated 03/13/2020 was personally reviewed today and demonstrated:  Sinus bradycardia with old inferior infarct pattern.  Recent Labwork: 10/16/2019: ALT 29; AST 28; B Natriuretic Peptide 126.0 03/12/2020: BUN 22; Creatinine, Ser 1.12; Hemoglobin 13.2; Platelets 283; Potassium 4.1; Sodium 138     Component Value Date/Time   CHOL 158 10/16/2019 1113   TRIG 111 10/16/2019 1113   HDL 33 (L) 10/16/2019 1113   CHOLHDL 4.8 10/16/2019 1113   VLDL 22 10/16/2019 1113   LDLCALC 103 (H) 10/16/2019 1113    Other Studies Reviewed Today:  Echocardiogram 11/02/2019: 1. Left ventricular ejection fraction, by visual estimation, is 60 to  65%. The left ventricle has normal function. There is mildly increased  left ventricular hypertrophy.  2. Left ventricular diastolic parameters are consistent with Grade I  diastolic dysfunction (impaired relaxation).  3. The left ventricle has no regional wall motion abnormalities.  4. Global right ventricle has normal systolic function.The right  ventricular size is normal. No increase in right ventricular wall  thickness.  5. Left atrial size was mildly dilated.  6. Right atrial size was normal.  7. Mild mitral annular calcification.  8. The mitral valve is grossly normal. Mild mitral valve regurgitation.  9. The tricuspid valve is grossly normal. Tricuspid valve regurgitation  is trivial.  10. The aortic valve was not well visualized. Aortic valve regurgitation  is not visualized.  11. The pulmonic valve was grossly normal. Pulmonic valve regurgitation is  trivial.  12. Normal pulmonary artery systolic pressure.  13. The tricuspid regurgitant velocity is 2.27 m/s, and with an assumed  right atrial pressure of 3 mmHg, the estimated right ventricular systolic  pressure is normal at 23.6 mmHg.  14.  The inferior vena cava is normal in size with greater than 50%  respiratory variability, suggesting right atrial pressure of 3 mmHg.   Carotid Dopplers 01/04/2020: Summary:  Right Carotid: Velocities in the right ICA are consistent with a 1-39%  stenosis.   Left Carotid: Evidence consistent with a total occlusion of the left ICA.  The        CCA appears occluded.   Vertebrals: Right vertebral artery demonstrates antegrade flow. Left  vertebral        artery demonstrates no discernable flow.  Subclavians: Normal flow hemodynamics were seen in bilateral subclavian        arteries.   Assessment and Plan:  1.  Multivessel CAD as outlined above.  She does not report any active angina and plan is to continue medical therapy and observation.  Only on aspirin, Norvasc, lisinopril,  Lopressor, and Lipitor.  2.  Essential hypertension on multimodal therapy.  No changes were made today.  3.  Mixed hyperlipidemia, continue high-dose Lipitor.  Keep scheduled follow-up with PCP for lab work.  4.  Bilateral carotid artery disease.  Follow-up carotid Dopplers next April.  Continue aspirin and statin.  Medication Adjustments/Labs and Tests Ordered: Current medicines are reviewed at length with the patient today.  Concerns regarding medicines are outlined above.   Tests Ordered: Orders Placed This Encounter  Procedures  . VAS US CAROTID    Medication Changes: No orders of the defined types were placed in this encounter.   Disposition:  Follow up 6 to 8 months in the [place] office.  Signed, Jonelle Sidle, MD, [facility] 06/20/2020 3:44 PM    [facility] [address] [place], [place], [place] [address] Phone: [phone]; Fax: [phone]

## 2020-06-20 NOTE — Patient Instructions (Signed)
Your physician recommends that you schedule a follow-up appointment in: April 2022 WITH DR MCDOWELL  Your physician recommends that you continue on your current medications as directed. Please refer to the Current Medication list given to you today.  Your physician has requested that you have a carotid duplex. This test is an ultrasound of the carotid arteries in your neck. It looks at blood flow through these arteries that supply the brain with blood. Allow one hour for this exam. There are no restrictions or special instructions.  Thank you for choosing [facility]!!

## 2020-06-21 ENCOUNTER — Other Ambulatory Visit: Payer: Self-pay | Admitting: Physician Assistant

## 2020-07-02 ENCOUNTER — Ambulatory Visit: Payer: Self-pay | Admitting: Physician Assistant

## 2020-07-03 ENCOUNTER — Ambulatory Visit: Payer: Self-pay | Admitting: Physician Assistant

## 2020-07-23 ENCOUNTER — Other Ambulatory Visit: Payer: Self-pay

## 2020-07-23 ENCOUNTER — Other Ambulatory Visit (HOSPITAL_COMMUNITY)
Admission: RE | Admit: 2020-07-23 | Discharge: 2020-07-23 | Disposition: A | Discharge status: Home / Self Care | Payer: Self-pay | Source: Ambulatory Visit | Attending: Physician Assistant | Admitting: Physician Assistant

## 2020-07-23 DIAGNOSIS — I10 Essential (primary) hypertension: Secondary | ICD-10-CM

## 2020-07-23 DIAGNOSIS — E785 Hyperlipidemia, unspecified: Secondary | ICD-10-CM

## 2020-07-23 DIAGNOSIS — I2510 Atherosclerotic heart disease of native coronary artery without angina pectoris: Secondary | ICD-10-CM

## 2020-07-23 LAB — COMPREHENSIVE METABOLIC PANEL
ALT: 21 U/L (ref 0–44)
AST: 24 U/L (ref 15–41)
Albumin: 3.6 g/dL (ref 3.5–5.0)
Alkaline Phosphatase: 93 U/L (ref 38–126)
Anion gap: 7 (ref 5–15)
BUN: 17 mg/dL (ref 6–20)
CO2: 25 mmol/L (ref 22–32)
Calcium: 8.9 mg/dL (ref 8.9–10.3)
Chloride: 107 mmol/L (ref 98–111)
Creatinine, Ser: 0.72 mg/dL (ref 0.44–1.00)
GFR, Estimated: >60 mL/min (ref >60)
Glucose, Bld: 101 mg/dL — ABNORMAL HIGH (ref 70–99)
Potassium: 4 mmol/L (ref 3.5–5.1)
Sodium: 139 mmol/L (ref 135–145)
Total Bilirubin: 0.7 mg/dL (ref 0.3–1.2)
Total Protein: 6.9 g/dL (ref 6.5–8.1)

## 2020-07-23 LAB — LIPID PANEL
Cholesterol: 196 mg/dL (ref 0–200)
HDL: 38 mg/dL — ABNORMAL LOW (ref >40)
LDL Cholesterol: 132 mg/dL — ABNORMAL HIGH (ref 0–99)
Total CHOL/HDL Ratio: 5.2 RATIO
Triglycerides: 132 mg/dL (ref <150)
VLDL: 26 mg/dL (ref 0–40)

## 2020-07-24 ENCOUNTER — Ambulatory Visit: Payer: Self-pay | Admitting: Physician Assistant

## 2020-07-24 ENCOUNTER — Encounter: Payer: Self-pay | Admitting: Physician Assistant

## 2020-07-24 VITALS — BP 120/70 | HR 52 | Temp 97.3°F | Ht 63.0 in | Wt 199.4 lb

## 2020-07-24 DIAGNOSIS — I10 Essential (primary) hypertension: Secondary | ICD-10-CM

## 2020-07-24 DIAGNOSIS — I779 Disorder of arteries and arterioles, unspecified: Secondary | ICD-10-CM

## 2020-07-24 DIAGNOSIS — E785 Hyperlipidemia, unspecified: Secondary | ICD-10-CM

## 2020-07-24 DIAGNOSIS — I714 Abdominal aortic aneurysm, without rupture, unspecified: Secondary | ICD-10-CM

## 2020-07-24 DIAGNOSIS — F17200 Nicotine dependence, unspecified, uncomplicated: Secondary | ICD-10-CM

## 2020-07-24 DIAGNOSIS — F1290 Cannabis use, unspecified, uncomplicated: Secondary | ICD-10-CM

## 2020-07-24 DIAGNOSIS — Z5320 Procedure and treatment not carried out because of patient's decision for unspecified reasons: Secondary | ICD-10-CM

## 2020-07-24 DIAGNOSIS — I2510 Atherosclerotic heart disease of native coronary artery without angina pectoris: Secondary | ICD-10-CM

## 2020-07-24 DIAGNOSIS — J449 Chronic obstructive pulmonary disease, unspecified: Secondary | ICD-10-CM

## 2020-07-24 MED ORDER — NITROGLYCERIN 0.4 MG SL SUBL: SUBLINGUAL_TABLET | SUBLINGUAL | 3 refills | Status: DC

## 2020-07-24 NOTE — Progress Notes (Signed)
BP 120/70   Pulse (!) 52   Temp (!) 97.3 F (36.3 C)   Ht 5\' 3"  (1.6 m)   Wt 199 lb 6.4 oz (90.4 kg)   LMP 07/18/2011   SpO2 96%   BMI 35.32 kg/m    Subjective:    Patient ID: 07/20/2011, female    DOB: 1961/05/19, 59 y.o.   MRN: [id number]  HPI: [name] is a 59 y.o. female presenting on 07/24/2020 for Hypertension and Hyperlipidemia   HPI   Pt had a negative covid 19 screening questionnaire.   Pt is 59yoF with HTN.  She says she needs more NTG.  She had 1 episode of CP several night s ago and couldn't find her NTG.  She says her Pain went away after just a few minutes.  Pt has CAD and is followed by cardiology.  She has history of substance abuse, cocaine in particular.  She says she still off drugs except MJ.  She says she is Going on a year now with no cocaine.  She is still smoking.  She says her Breathing is okay.  She has no complaints today.   Relevant past medical, surgical, family and social history reviewed and updated as indicated. Interim medical history since our last visit reviewed. Allergies and medications reviewed and updated.   Current Outpatient Medications:  .  albuterol (PROVENTIL HFA) 108 (90 Base) MCG/ACT inhaler, INHALE 2 PUFFS BY MOUTH EVERY 6 HOURS AS NEEDED FOR COUGHING, WHEEZING, OR SHORTNESS OF BREATH, Disp: 20.1 g, Rfl: 1 .  amLODipine (NORVASC) 10 MG tablet, Take 1 tablet (10 mg total) by mouth daily., Disp: 90 tablet, Rfl: 1 .  aspirin EC 81 MG tablet, Take 1 tablet (81 mg total) by mouth daily., Disp: 90 tablet, Rfl: 3 .  atorvastatin (LIPITOR) 80 MG tablet, TAKE 1 Tablet BY MOUTH EVERY DAY AT 6PM, Disp: 90 tablet, Rfl: 1 .  cloNIDine (CATAPRES) 0.2 MG tablet, TAKE 1 Tablet  BY MOUTH TWICE DAILY, Disp: 180 tablet, Rfl: 1 .  furosemide (LASIX) 20 MG tablet, Take 1 tablet (20 mg total) by mouth daily., Disp: 90 tablet, Rfl: 3 .  lisinopril (ZESTRIL) 40 MG tablet, Take 1 tablet (40 mg total) by mouth daily., Disp: 90 tablet,  Rfl: 1 .  metoprolol tartrate (LOPRESSOR) 100 MG tablet, TAKE 2 Tablets BY MOUTH TWICE DAILY, Disp: 360 tablet, Rfl: 1 .  WIXELA INHUB 100-50 MCG/DOSE AEPB, INHALE 1 PUFF BY MOUTH TWICE DAILY. RINSE MOUTH AFTER EACH USE. (MORNING AND BEDTIME), Disp: 60 each, Rfl: 1 .  HYDROcodone-acetaminophen (NORCO/VICODIN) 5-325 MG tablet, Take 1 tablet by mouth every 6 (six) hours as needed. (Patient not taking: Reported on 07/24/2020), Disp: 20 tablet, Rfl: 0 .  nitroGLYCERIN (NITROSTAT) 0.4 MG SL tablet, PLACE 1 TABLET UNDER TONGUE AS NEEDED FOR CHEST PAIN EVERY 5 MINUTES X 3 MAX DOSES.  CALL 911 IF PAIN PERSISTS (Patient not taking: Reported on 07/24/2020), Disp: 25 tablet, Rfl: 3    Review of Systems  Per HPI unless specifically indicated above     Objective:    BP 120/70   Pulse (!) 52   Temp (!) 97.3 F (36.3 C)   Ht 5\' 3"  (1.6 m)   Wt 199 lb 6.4 oz (90.4 kg)   LMP 07/18/2011   SpO2 96%   BMI 35.32 kg/m   Wt Readings from Last 3 Encounters:  07/24/20 199 lb 6.4 oz (90.4 kg)  06/20/20 200 lb (90.7 kg)  04/04/20  194 lb (88 kg)    Physical Exam Vitals reviewed.  Constitutional:      General: She is not in acute distress.    Appearance: She is well-developed. She is not ill-appearing.  HENT:     Head: Normocephalic and atraumatic.  Cardiovascular:     Rate and Rhythm: Normal rate and regular rhythm.  Pulmonary:     Effort: Pulmonary effort is normal.     Breath sounds: Normal breath sounds.  Abdominal:     General: Bowel sounds are normal.     Palpations: Abdomen is soft. There is no mass.     Tenderness: There is no abdominal tenderness.  Musculoskeletal:     Cervical back: Neck supple.     Right lower leg: No edema.     Left lower leg: No edema.  Lymphadenopathy:     Cervical: No cervical adenopathy.  Skin:    General: Skin is warm and dry.  Neurological:     Mental Status: She is alert and oriented to person, place, and time.  Psychiatric:        Attention and  Perception: Attention normal.        Mood and Affect: Mood normal.        Speech: Speech normal.        Behavior: Behavior normal. Behavior is cooperative.     Results for orders placed or performed during the hospital encounter of 07/23/20  Lipid panel  Result Value Ref Range   Cholesterol 196 0 - 200 mg/dL   Triglycerides 119 <417 mg/dL   HDL 38 (L) >40 mg/dL   Total CHOL/HDL Ratio 5.2 RATIO   VLDL 26 0 - 40 mg/dL   LDL Cholesterol 814 (H) 0 - 99 mg/dL  Comprehensive metabolic panel  Result Value Ref Range   Sodium 139 135 - 145 mmol/L   Potassium 4.0 3.5 - 5.1 mmol/L   Chloride 107 98 - 111 mmol/L   CO2 25 22 - 32 mmol/L   Glucose, Bld 101 (H) 70 - 99 mg/dL   BUN 17 6 - 20 mg/dL   Creatinine, Ser 4.81 0.44 - 1.00 mg/dL   Calcium 8.9 8.9 - 85.6 mg/dL   Total Protein 6.9 6.5 - 8.1 g/dL   Albumin 3.6 3.5 - 5.0 g/dL   AST 24 15 - 41 U/L   ALT 21 0 - 44 U/L   Alkaline Phosphatase 93 38 - 126 U/L   Total Bilirubin 0.7 0.3 - 1.2 mg/dL   GFR, Estimated >31 >49 mL/min   Anion gap 7 5 - 15      Assessment & Plan:   Encounter Diagnoses  Name Primary?  . Essential hypertension Yes  . Coronary artery disease involving native coronary artery of native heart without angina pectoris   . Dyslipidemia   . Chronic obstructive pulmonary disease, unspecified COPD type (HCC)   . Screening mammography declined   . Tobacco use disorder   . AAA (abdominal aortic aneurysm) without rupture (HCC)   . Bilateral carotid artery disease, unspecified type (HCC)   . Colon cancer screening declined   . Marijuana use      -reviewed labs with pt.  Counseled on lowfat diet.  She is already taking 80mg  lipitor. -pt Declines screening mammogram -pt Declines ifobt for colon cancer screening -educated pt and Encouraged her to get covid vaccination -pt is due to follow for AAA -pt to follow up 3 months.  She is to contact office sooner prn

## 2020-08-15 ENCOUNTER — Other Ambulatory Visit: Payer: Self-pay | Admitting: Physician Assistant

## 2020-08-19 ENCOUNTER — Other Ambulatory Visit: Payer: Self-pay

## 2020-08-19 ENCOUNTER — Ambulatory Visit (HOSPITAL_COMMUNITY)
Admission: RE | Admit: 2020-08-19 | Discharge: 2020-08-19 | Disposition: A | Discharge status: Home / Self Care | Payer: Self-pay | Source: Ambulatory Visit | Attending: Physician Assistant | Admitting: Physician Assistant

## 2020-08-19 DIAGNOSIS — I714 Abdominal aortic aneurysm, without rupture, unspecified: Secondary | ICD-10-CM

## 2020-10-03 ENCOUNTER — Other Ambulatory Visit: Payer: Self-pay | Admitting: Physician Assistant

## 2020-10-16 ENCOUNTER — Other Ambulatory Visit: Payer: Self-pay

## 2020-10-16 ENCOUNTER — Other Ambulatory Visit: Payer: Self-pay | Admitting: Physician Assistant

## 2020-10-16 ENCOUNTER — Encounter: Payer: Self-pay | Admitting: Vascular Surgery

## 2020-10-16 ENCOUNTER — Ambulatory Visit (INDEPENDENT_AMBULATORY_CARE_PROVIDER_SITE_OTHER): Payer: Self-pay | Admitting: Vascular Surgery

## 2020-10-16 VITALS — BP 106/62 | HR 53 | Temp 97.7°F | Resp 20 | Ht 63.0 in | Wt 202.0 lb

## 2020-10-16 DIAGNOSIS — I714 Abdominal aortic aneurysm, without rupture, unspecified: Secondary | ICD-10-CM

## 2020-10-16 DIAGNOSIS — E785 Hyperlipidemia, unspecified: Secondary | ICD-10-CM

## 2020-10-16 DIAGNOSIS — I10 Essential (primary) hypertension: Secondary | ICD-10-CM

## 2020-10-16 DIAGNOSIS — I6523 Occlusion and stenosis of bilateral carotid arteries: Secondary | ICD-10-CM

## 2020-10-16 NOTE — Progress Notes (Signed)
REASON FOR VISIT:   Abdominal aortic aneurysm.  The consult is requested by Jacquelin Hawking, PA.  MEDICAL ISSUES:   ABDOMINAL AORTIC ANEURYSM: This patient has a 4.5 cm infrarenal abdominal aortic aneurysm.  I have explained that in a normal risk patient we would consider elective repair at 5.5 cm.  I have ordered a follow-up ultrasound in 6 months and I will see her back at that time.  Of note we have had a long discussion about the importance of tobacco cessation (3 min).  She understands that continued tobacco use does increase her risk of aneurysm expansion and rupture.  Also discussed the importance of blood pressure control.  PERIPHERAL VASCULAR DISEASE: The patient does describe bilateral lower extremity claudication.  She has diminished femoral pulses although this may be related to her body habitus which makes her pulses difficult to assess.  I have ordered ABIs when she returns in 6 months and we can have as a baseline.  BILATERAL CAROTID DISEASE: This patient has a known left internal carotid artery occlusion.  The patient underwent a redo right carotid endarterectomy in May 2019.  Her most recent study was in April 2021 at which time the right side was wide open.  She would be due for a follow-up study later this year.    HPI:   [name] is a pleasant 60 y.o. female who is referred with an abdominal aortic aneurysm.  She had an ultrasound [place] where that showed a 4.5 cm infrarenal abdominal aortic aneurysm.  Apparently this was a follow-up study.  She was asymptomatic.  She denies any history of abdominal pain or back pain.  She does continue to smoke 12 cigarettes a day and has been smoking for as long as she can remember.  The patient is undergone a previous right carotid endarterectomy in 2006 and then a redo right carotid endarterectomy in 2019.  She denies any history of stroke, TIAs, expressive or receptive aphasia, or amaurosis fugax.  Past Medical History:   Diagnosis Date  . AAA (abdominal aortic aneurysm) without rupture (HCC) 08/25/2016  . Anxiety   . Arthritis   . Bipolar 1 disorder (HCC)   . CAD (coronary artery disease)    a. 08/08/16 LAD 70%, 60% 1st diag, CTO RCA Med Rx EF 55%  . Carotid artery stenosis    Right carotid endarterectomy 07/22/05 with recurrent stenosis 01/2017; known left ICA occlusion  . COPD (chronic obstructive pulmonary disease) (HCC)   . Crack cocaine use   . Depression   . Headache   . Hypertension   . Myocardial infarction (HCC)   . Right Groin Hematoma    a. 07/2016 following diagnostic cath  . Stroke [facility]) 2017    Family History  Problem Relation Age of Onset  . Heart attack Mother   . Stroke Mother   . Hypertension Mother   . Diabetes Mother   . Heart disease Mother   . Heart attack Father   . Hypertension Father   . Heart disease Father   . Cancer Maternal Aunt   . Cancer Maternal Uncle   . COPD Paternal Uncle        2 pat uncles with COPD  . Cancer Paternal Uncle        1 pat uncle with cancer    SOCIAL HISTORY: Social History   Tobacco Use  . Smoking status: Current Every Day Smoker    Packs/day: 0.50    Years: 42.00  Pack years: 21.00    Types: Cigarettes    Start date: 10/05/1974  . Smokeless tobacco: Never Used  Substance Use Topics  . Alcohol use: No    Allergies  Allergen Reactions  . Penicillins Hives and Swelling    PATIENT HAS HAD A PCN REACTION WITH IMMEDIATE RASH, FACIAL/TONGUE/THROAT SWELLING, SOB, OR LIGHTHEADEDNESS WITH HYPOTENSION:  #  #  YES  #  # HAS PT DEVELOPED SEVERE RASH INVOLVING MUCUS MEMBRANES or SKIN NECROSIS: #  #  YES  #  # Has patient had a PCN reaction that required hospitalization No Has patient had a PCN reaction occurring within the last 10 years: Non If all of the above answers are "NO", then may proceed with Cephalosporin use.     Current Outpatient Medications  Medication Sig Dispense Refill  . albuterol (PROVENTIL HFA) 108 (90 Base)  MCG/ACT inhaler INHALE 2 PUFFS BY MOUTH EVERY 6 HOURS AS NEEDED FOR COUGHING, WHEEZING, OR SHORTNESS OF BREATH 20.1 g 1  . amLODipine (NORVASC) 10 MG tablet TAKE 1 Tablet BY MOUTH ONCE EVERY DAY 90 tablet 1  . aspirin EC 81 MG tablet Take 1 tablet (81 mg total) by mouth daily. 90 tablet 3  . atorvastatin (LIPITOR) 80 MG tablet TAKE 1 Tablet BY MOUTH ONCE EVERY DAY AT 6PM 90 tablet 1  . cloNIDine (CATAPRES) 0.2 MG tablet TAKE 1 Tablet  BY MOUTH TWICE DAILY 120 tablet 1  . Fluticasone-Salmeterol (ADVAIR) 100-50 MCG/DOSE AEPB INHALE 1 PUFF BY MOUTH TWICE DAILY (EVERY 12 HOURS). RINSE MOUTH AFTER USE. (MORNING AND BEDTIME) 120 each 1  . lisinopril (ZESTRIL) 40 MG tablet TAKE 1 Tablet BY MOUTH ONCE EVERY DAY 90 tablet 1  . metoprolol tartrate (LOPRESSOR) 100 MG tablet TAKE 2 Tablets BY MOUTH TWICE DAILY 360 tablet 1  . nitroGLYCERIN (NITROSTAT) 0.4 MG SL tablet PLACE 1 TABLET UNDER TONGUE AS NEEDED FOR CHEST PAIN EVERY 5 MINUTES X 3 MAX DOSES.  CALL 911 IF PAIN PERSISTS 25 tablet 3  . furosemide (LASIX) 20 MG tablet Take 1 tablet (20 mg total) by mouth daily. 90 tablet 3   No current facility-administered medications for this visit.    REVIEW OF SYSTEMS:  [X]  denotes positive finding, [ ]  denotes negative finding Cardiac  Comments:  Chest pain or chest pressure:    Shortness of breath upon exertion:    Short of breath when lying flat:    Irregular heart rhythm:        Vascular    Pain in calf, thigh, or hip brought on by ambulation:    Pain in feet at night that wakes you up from your sleep:     Blood clot in your veins:    Leg swelling:         Pulmonary    Oxygen at home:    Productive cough:     Wheezing:         Neurologic    Sudden weakness in arms or legs:     Sudden numbness in arms or legs:     Sudden onset of difficulty speaking or slurred speech:    Temporary loss of vision in one eye:     Problems with dizziness:         Gastrointestinal    Blood in stool:     Vomited  blood:         Genitourinary    Burning when urinating:     Blood in urine:  Psychiatric    Major depression:         Hematologic    Bleeding problems:    Problems with blood clotting too easily:        Skin    Rashes or ulcers:        Constitutional    Fever or chills:     PHYSICAL EXAM:   Vitals:   10/16/20 1319  BP: 106/62  Pulse: (!) 53  Resp: 20  Temp: 97.7 F (36.5 C)  SpO2: 95%  Weight: 202 lb (91.6 kg)  Height: 5\' 3"  (1.6 m)   Body mass index is 35.78 kg/m.  GENERAL: The patient is a well-nourished female, in no acute distress. The vital signs are documented above. CARDIAC: There is a regular rate and rhythm.  VASCULAR: I do not detect carotid bruits. I cannot palpate femoral pulses or pedal pulses. PULMONARY: There is good air exchange bilaterally without wheezing or rales. ABDOMEN: Soft and non-tender with normal pitched bowel sounds.  I do not palpate an abdominal aortic aneurysm although her abdomen is difficult to assess because of her body habitus. MUSCULOSKELETAL: There are no major deformities or cyanosis. NEUROLOGIC: No focal weakness or paresthesias are detected. SKIN: There are no ulcers or rashes noted. PSYCHIATRIC: The patient has a normal affect.  DATA:    ABDOMINAL ULTRASOUND: I have reviewed her abdominal ultrasound that was done on 08/05/2020.  This showed a 4.5 cm infrarenal abdominal aortic aneurysm.  LABS: I reviewed her labs from 07/23/2020.  GFR was greater than 60.  Her HDL cholesterol was 38.  LDL cholesterol 132.  Total cholesterol 196.  07/25/2020 [facility] [phone]

## 2020-10-17 ENCOUNTER — Other Ambulatory Visit: Payer: Self-pay

## 2020-10-17 DIAGNOSIS — I6523 Occlusion and stenosis of bilateral carotid arteries: Secondary | ICD-10-CM

## 2020-10-17 DIAGNOSIS — I714 Abdominal aortic aneurysm, without rupture, unspecified: Secondary | ICD-10-CM

## 2020-10-25 ENCOUNTER — Other Ambulatory Visit (HOSPITAL_COMMUNITY)
Admission: RE | Admit: 2020-10-25 | Discharge: 2020-10-25 | Disposition: A | Discharge status: Home / Self Care | Payer: Self-pay | Source: Ambulatory Visit | Attending: Physician Assistant | Admitting: Physician Assistant

## 2020-10-25 ENCOUNTER — Other Ambulatory Visit: Payer: Self-pay

## 2020-10-25 DIAGNOSIS — E785 Hyperlipidemia, unspecified: Secondary | ICD-10-CM

## 2020-10-25 DIAGNOSIS — I10 Essential (primary) hypertension: Secondary | ICD-10-CM

## 2020-10-25 LAB — COMPREHENSIVE METABOLIC PANEL
ALT: 18 U/L (ref 0–44)
AST: 22 U/L (ref 15–41)
Albumin: 3.5 g/dL (ref 3.5–5.0)
Alkaline Phosphatase: 97 U/L (ref 38–126)
Anion gap: 7 (ref 5–15)
BUN: 20 mg/dL (ref 6–20)
CO2: 26 mmol/L (ref 22–32)
Calcium: 8.5 mg/dL — ABNORMAL LOW (ref 8.9–10.3)
Chloride: 106 mmol/L (ref 98–111)
Creatinine, Ser: 0.73 mg/dL (ref 0.44–1.00)
GFR, Estimated: >60 mL/min (ref >60)
Glucose, Bld: 114 mg/dL — ABNORMAL HIGH (ref 70–99)
Potassium: 3.6 mmol/L (ref 3.5–5.1)
Sodium: 139 mmol/L (ref 135–145)
Total Bilirubin: 0.5 mg/dL (ref 0.3–1.2)
Total Protein: 7.1 g/dL (ref 6.5–8.1)

## 2020-10-25 LAB — LIPID PANEL
Cholesterol: 157 mg/dL (ref 0–200)
HDL: 29 mg/dL — ABNORMAL LOW (ref >40)
LDL Cholesterol: 88 mg/dL (ref 0–99)
Total CHOL/HDL Ratio: 5.4 RATIO
Triglycerides: 202 mg/dL — ABNORMAL HIGH (ref <150)
VLDL: 40 mg/dL (ref 0–40)

## 2020-10-29 ENCOUNTER — Ambulatory Visit: Payer: Self-pay | Admitting: Physician Assistant

## 2020-10-29 ENCOUNTER — Encounter: Payer: Self-pay | Admitting: Physician Assistant

## 2020-10-29 VITALS — BP 106/68

## 2020-10-29 DIAGNOSIS — I714 Abdominal aortic aneurysm, without rupture, unspecified: Secondary | ICD-10-CM

## 2020-10-29 DIAGNOSIS — I2510 Atherosclerotic heart disease of native coronary artery without angina pectoris: Secondary | ICD-10-CM

## 2020-10-29 DIAGNOSIS — E785 Hyperlipidemia, unspecified: Secondary | ICD-10-CM

## 2020-10-29 DIAGNOSIS — F1290 Cannabis use, unspecified, uncomplicated: Secondary | ICD-10-CM

## 2020-10-29 DIAGNOSIS — F17200 Nicotine dependence, unspecified, uncomplicated: Secondary | ICD-10-CM

## 2020-10-29 DIAGNOSIS — I779 Disorder of arteries and arterioles, unspecified: Secondary | ICD-10-CM

## 2020-10-29 DIAGNOSIS — J449 Chronic obstructive pulmonary disease, unspecified: Secondary | ICD-10-CM

## 2020-10-29 DIAGNOSIS — I10 Essential (primary) hypertension: Secondary | ICD-10-CM

## 2020-10-29 NOTE — Progress Notes (Signed)
BP 106/68   LMP 07/18/2011    Subjective:    Patient ID: [name], female    DOB: [birth year], 60 y.o.   MRN: [id number]  HPI: JAMIN HUMPHRIES is a 60 y.o. female presenting on 10/29/2020 for No chief complaint on file.   HPI    This is a telemedicine appointment due to coronavirus pandemic.  It is via telephone due to pt does not have video capabilities.  I connected with  [name] on 10/29/20 by a video enabled telemedicine application and verified that I am speaking with the correct person using two identifiers.   I discussed the limitations of evaluation and management by telemedicine. The patient expressed understanding and agreed to proceed.  Pt is at home.  Provider is at office.    Pt is 60yoF with HTN.  She doesn't have her bp machine that we gave her.  She took it to her daughters house.  She says the last time she took her bp it was 106/68.  She is avoiding the cocaine.  She is "smoking weed".  She says her Breathing is good  She has not gotten any covid vaccinations      Relevant past medical, surgical, family and social history reviewed and updated as indicated. Interim medical history since our last visit reviewed. Allergies and medications reviewed and updated.    Current Outpatient Medications:  .  albuterol (PROVENTIL HFA) 108 (90 Base) MCG/ACT inhaler, INHALE 2 PUFFS BY MOUTH EVERY 6 HOURS AS NEEDED FOR COUGHING, WHEEZING, OR SHORTNESS OF BREATH, Disp: 20.1 g, Rfl: 1 .  amLODipine (NORVASC) 10 MG tablet, TAKE 1 Tablet BY MOUTH ONCE EVERY DAY, Disp: 90 tablet, Rfl: 1 .  aspirin 325 MG tablet, Take 325 mg by mouth daily., Disp: , Rfl:  .  atorvastatin (LIPITOR) 80 MG tablet, TAKE 1 Tablet BY MOUTH ONCE EVERY DAY AT 6PM, Disp: 90 tablet, Rfl: 1 .  cloNIDine (CATAPRES) 0.2 MG tablet, TAKE 1 Tablet  BY MOUTH TWICE DAILY, Disp: 120 tablet, Rfl: 1 .  Fluticasone-Salmeterol (ADVAIR) 100-50 MCG/DOSE AEPB, INHALE 1 PUFF BY MOUTH TWICE DAILY  (EVERY 12 HOURS). RINSE MOUTH AFTER USE. (MORNING AND BEDTIME), Disp: 120 each, Rfl: 1 .  lisinopril (ZESTRIL) 40 MG tablet, TAKE 1 Tablet BY MOUTH ONCE EVERY DAY, Disp: 90 tablet, Rfl: 1 .  metoprolol tartrate (LOPRESSOR) 100 MG tablet, TAKE 2 Tablets BY MOUTH TWICE DAILY, Disp: 360 tablet, Rfl: 1 .  aspirin EC 81 MG tablet, Take 1 tablet (81 mg total) by mouth daily. (Patient not taking: Reported on 10/29/2020), Disp: 90 tablet, Rfl: 3 .  furosemide (LASIX) 20 MG tablet, Take 1 tablet (20 mg total) by mouth daily., Disp: 90 tablet, Rfl: 3 .  nitroGLYCERIN (NITROSTAT) 0.4 MG SL tablet, PLACE 1 TABLET UNDER TONGUE AS NEEDED FOR CHEST PAIN EVERY 5 MINUTES X 3 MAX DOSES.  CALL 911 IF PAIN PERSISTS (Patient not taking: Reported on 10/29/2020), Disp: 25 tablet, Rfl: 3     Review of Systems  Per HPI unless specifically indicated above     Objective:    BP 106/68   LMP 07/18/2011   Wt Readings from Last 3 Encounters:  10/16/20 202 lb (91.6 kg)  07/24/20 199 lb 6.4 oz (90.4 kg)  06/20/20 200 lb (90.7 kg)    Physical Exam Pulmonary:     Effort: No respiratory distress.     Comments: Pt is talking in complete sentences. Neurological:     Mental Status:  She is alert and oriented to person, place, and time.  Psychiatric:        Attention and Perception: Attention normal.        Speech: Speech normal.        Behavior: Behavior is cooperative.     Results for orders placed or performed during the hospital encounter of 10/25/20  Comprehensive metabolic panel  Result Value Ref Range   Sodium 139 135 - 145 mmol/L   Potassium 3.6 3.5 - 5.1 mmol/L   Chloride 106 98 - 111 mmol/L   CO2 26 22 - 32 mmol/L   Glucose, Bld 114 (H) 70 - 99 mg/dL   BUN 20 6 - 20 mg/dL   Creatinine, Ser 5.92 0.44 - 1.00 mg/dL   Calcium 8.5 (L) 8.9 - 10.3 mg/dL   Total Protein 7.1 6.5 - 8.1 g/dL   Albumin 3.5 3.5 - 5.0 g/dL   AST 22 15 - 41 U/L   ALT 18 0 - 44 U/L   Alkaline Phosphatase 97 38 - 126 U/L   Total  Bilirubin 0.5 0.3 - 1.2 mg/dL   GFR, Estimated >92 >44 mL/min   Anion gap 7 5 - 15  Lipid panel  Result Value Ref Range   Cholesterol 157 0 - 200 mg/dL   Triglycerides 628 (H) <150 mg/dL   HDL 29 (L) >63 mg/dL   Total CHOL/HDL Ratio 5.4 RATIO   VLDL 40 0 - 40 mg/dL   LDL Cholesterol 88 0 - 99 mg/dL      Assessment & Plan:   Encounter Diagnoses  Name Primary?  . Essential hypertension Yes  . Dyslipidemia   . Marijuana use   . Tobacco use disorder   . Coronary artery disease involving native coronary artery of native heart without angina pectoris   . Chronic obstructive pulmonary disease, unspecified COPD type (HCC)   . AAA (abdominal aortic aneurysm) without rupture (HCC)   . Bilateral carotid artery disease, unspecified type (HCC)       -reviewed labs with pt -pt Declines screening mammogram -pt to Continue currrent meds -encouraged pt to get covid vaccination -encouraged smoking cessation -US abdomen updated in November to check AAA.   She was seen by vascular surgery in reference to this in January -pt to Follow up 3 months.  She is to contact office sooner prn

## 2020-12-30 ENCOUNTER — Telehealth: Payer: Self-pay

## 2020-12-30 ENCOUNTER — Other Ambulatory Visit: Payer: Self-pay | Admitting: Physician Assistant

## 2020-12-30 MED ORDER — LISINOPRIL 40 MG PO TABS: ORAL_TABLET | ORAL | 1 refills | Status: DC

## 2020-12-30 MED ORDER — CLONIDINE HCL 0.2 MG PO TABS: 0.2000 mg | ORAL_TABLET | Freq: Two times a day (BID) | ORAL | 1 refills | Status: DC

## 2020-12-30 MED ORDER — AMLODIPINE BESYLATE 10 MG PO TABS: ORAL_TABLET | ORAL | 1 refills | Status: DC

## 2020-12-30 MED ORDER — METOPROLOL TARTRATE 100 MG PO TABS: 200.0000 mg | ORAL_TABLET | Freq: Two times a day (BID) | ORAL | 1 refills | Status: DC

## 2020-12-30 NOTE — Telephone Encounter (Signed)
Returned call to patient in regards to medications. Pt's [facility] and [employer] have expired. Advised pt to call [employer] to resign up for both programs. Pt would like provider to send medications to [facility] in [place] until she gets approved.

## 2021-01-28 ENCOUNTER — Other Ambulatory Visit: Payer: Self-pay | Admitting: Physician Assistant

## 2021-01-28 MED ORDER — CLONIDINE HCL 0.2 MG PO TABS: 0.2000 mg | ORAL_TABLET | Freq: Two times a day (BID) | ORAL | 0 refills | Status: DC

## 2021-01-28 MED ORDER — NITROGLYCERIN 0.4 MG SL SUBL: SUBLINGUAL_TABLET | SUBLINGUAL | 0 refills | Status: DC

## 2021-01-28 MED ORDER — ALBUTEROL SULFATE HFA 108 (90 BASE) MCG/ACT IN AERS: INHALATION_SPRAY | RESPIRATORY_TRACT | 0 refills | Status: DC

## 2021-01-28 MED ORDER — ATORVASTATIN CALCIUM 80 MG PO TABS: ORAL_TABLET | ORAL | 0 refills | Status: DC

## 2021-01-28 MED ORDER — LISINOPRIL 40 MG PO TABS: ORAL_TABLET | ORAL | 0 refills | Status: DC

## 2021-01-28 MED ORDER — METOPROLOL TARTRATE 100 MG PO TABS: 100.0000 mg | ORAL_TABLET | Freq: Two times a day (BID) | ORAL | 0 refills | Status: DC

## 2021-01-28 MED ORDER — FUROSEMIDE 20 MG PO TABS: 20.0000 mg | ORAL_TABLET | Freq: Every day | ORAL | 0 refills | Status: DC

## 2021-02-03 ENCOUNTER — Encounter: Payer: Self-pay | Admitting: Physician Assistant

## 2021-02-03 ENCOUNTER — Ambulatory Visit: Payer: Self-pay | Admitting: Physician Assistant

## 2021-02-03 ENCOUNTER — Other Ambulatory Visit: Payer: Self-pay | Admitting: Physician Assistant

## 2021-02-03 VITALS — BP 152/86 | HR 56 | Temp 97.7°F | Wt 197.6 lb

## 2021-02-03 DIAGNOSIS — Z1211 Encounter for screening for malignant neoplasm of colon: Secondary | ICD-10-CM

## 2021-02-03 DIAGNOSIS — I714 Abdominal aortic aneurysm, without rupture, unspecified: Secondary | ICD-10-CM

## 2021-02-03 DIAGNOSIS — I2510 Atherosclerotic heart disease of native coronary artery without angina pectoris: Secondary | ICD-10-CM

## 2021-02-03 DIAGNOSIS — F17200 Nicotine dependence, unspecified, uncomplicated: Secondary | ICD-10-CM

## 2021-02-03 DIAGNOSIS — Z5320 Procedure and treatment not carried out because of patient's decision for unspecified reasons: Secondary | ICD-10-CM

## 2021-02-03 DIAGNOSIS — F1290 Cannabis use, unspecified, uncomplicated: Secondary | ICD-10-CM

## 2021-02-03 DIAGNOSIS — E785 Hyperlipidemia, unspecified: Secondary | ICD-10-CM

## 2021-02-03 DIAGNOSIS — I779 Disorder of arteries and arterioles, unspecified: Secondary | ICD-10-CM

## 2021-02-03 DIAGNOSIS — J449 Chronic obstructive pulmonary disease, unspecified: Secondary | ICD-10-CM

## 2021-02-03 DIAGNOSIS — I10 Essential (primary) hypertension: Secondary | ICD-10-CM

## 2021-02-03 MED ORDER — NITROGLYCERIN 0.4 MG SL SUBL: SUBLINGUAL_TABLET | SUBLINGUAL | 3 refills | Status: DC

## 2021-02-03 MED ORDER — FUROSEMIDE 20 MG PO TABS: 20.0000 mg | ORAL_TABLET | Freq: Every day | ORAL | 3 refills | Status: DC

## 2021-02-03 NOTE — Patient Instructions (Signed)

## 2021-02-03 NOTE — Progress Notes (Signed)
BP (!) 152/86   Pulse (!) 56   Temp 97.7 F (36.5 C)   Wt 197 lb 9.6 oz (89.6 kg)   LMP 07/18/2011   BMI 35.00 kg/m    Subjective:    Patient ID: [name], female    DOB: Apr 18, 1961, 60 y.o.   MRN: [id number]  HPI: [name] is a 60 y.o. female presenting on 02/03/2021 for No chief complaint on file.   HPI   Pt had a negative covid 19 screening questionnaire.   Pt is 60yoF with vascular disease.    O2 sat  Unavailable today due to patient is wearing fingernail polish  Pt says she's scheduled for carotid US, AAA [place] and and ABI in July prior to her appointment with vascular surgeon.    Pt says her Legs hurt- left knee and right leg aches all over.   Pt is no longer using cocaine.  She is still smoking cigarettes and Marijuana  She did not bring her meds and isn't exactly sure what she is taking  She checks her bp at home.    She says it's been in the 150s lately and she thought that was good.   She has not gotten the covid vaccination.    Relevant past medical, surgical, family and social history reviewed and updated as indicated. Interim medical history since our last visit reviewed. Allergies and medications reviewed and updated.   Current Outpatient Medications:  .  albuterol (PROVENTIL HFA) 108 (90 Base) MCG/ACT inhaler, INHALE 2 PUFFS BY MOUTH EVERY 6 HOURS AS NEEDED FOR COUGHING, WHEEZING, OR SHORTNESS OF BREATH, Disp: 3 each, Rfl: 0 .  amLODipine (NORVASC) 10 MG tablet, TAKE 1 Tablet BY MOUTH ONCE EVERY DAY, Disp: 90 tablet, Rfl: 1 .  aspirin 325 MG tablet, Take 325 mg by mouth daily., Disp: , Rfl:  .  atorvastatin (LIPITOR) 80 MG tablet, TAKE 1 Tablet BY MOUTH ONCE EVERY DAY AT 6PM, Disp: 90 tablet, Rfl: 0 .  cloNIDine (CATAPRES) 0.2 MG tablet, Take 1 tablet (0.2 mg total) by mouth 2 (two) times daily., Disp: 180 tablet, Rfl: 0 .  lisinopril (ZESTRIL) 40 MG tablet, TAKE 1 Tablet BY MOUTH ONCE EVERY DAY, Disp: 90 tablet, Rfl: 0 .  metoprolol  tartrate (LOPRESSOR) 100 MG tablet, Take 1 tablet (100 mg total) by mouth 2 (two) times daily., Disp: 180 tablet, Rfl: 0 .  Fluticasone-Salmeterol (ADVAIR) 100-50 MCG/DOSE AEPB, INHALE 1 PUFF BY MOUTH TWICE DAILY (EVERY 12 HOURS). RINSE MOUTH AFTER USE. (MORNING AND BEDTIME) (Patient not taking: Reported on 02/03/2021), Disp: 120 each, Rfl: 1 .  furosemide (LASIX) 20 MG tablet, Take 1 tablet (20 mg total) by mouth daily., Disp: 90 tablet, Rfl: 0 .  nitroGLYCERIN (NITROSTAT) 0.4 MG SL tablet, PLACE 1 TABLET UNDER TONGUE AS NEEDED FOR CHEST PAIN EVERY 5 MINUTES X 3 MAX DOSES.  CALL 911 IF PAIN PERSISTS (Patient not taking: Reported on 02/03/2021), Disp: 25 tablet, Rfl: 0    Review of Systems  Per HPI unless specifically indicated above     Objective:    BP (!) 152/86   Pulse (!) 56   Temp 97.7 F (36.5 C)   Wt 197 lb 9.6 oz (89.6 kg)   LMP 07/18/2011   BMI 35.00 kg/m   Wt Readings from Last 3 Encounters:  02/03/21 197 lb 9.6 oz (89.6 kg)  10/16/20 202 lb (91.6 kg)  07/24/20 199 lb 6.4 oz (90.4 kg)    Physical Exam Vitals reviewed.  Constitutional:      General: She is not in acute distress.    Appearance: She is well-developed. She is not toxic-appearing.  HENT:     Head: Normocephalic and atraumatic.  Cardiovascular:     Rate and Rhythm: Normal rate and regular rhythm.  Pulmonary:     Effort: Pulmonary effort is normal.     Breath sounds: Normal breath sounds.  Abdominal:     General: Bowel sounds are normal.     Palpations: Abdomen is soft. There is no mass.     Tenderness: There is no abdominal tenderness.  Musculoskeletal:     Cervical back: Neck supple.     Right lower leg: Edema present.     Left lower leg: Edema present.  Lymphadenopathy:     Cervical: No cervical adenopathy.  Skin:    General: Skin is warm and dry.  Neurological:     Mental Status: She is alert and oriented to person, place, and time.  Psychiatric:        Behavior: Behavior normal.               Assessment & Plan:    Encounter Diagnoses  Name Primary?  . Essential hypertension Yes  . Dyslipidemia   . Marijuana use   . Tobacco use disorder   . Coronary artery disease involving native coronary artery of native heart without angina pectoris   . Chronic obstructive pulmonary disease, unspecified COPD type (HCC)   . AAA (abdominal aortic aneurysm) without rupture (HCC)   . Bilateral carotid artery disease, unspecified type (HCC)   . Screening mammography declined   . Screening for colon cancer        -pt Declines screening mammogram -Encouraged pt to get covid vaccination -Checking lipids q29month so will be due again in July;  Will do before surgeon & cardiology appts -Encouraged smoking cessation -pt is given FIT test for colon cancer screening  -NTG to [facility] since she is out of that -pt encouraged to check her list of medications with her bottles when she gets home to make sure she isn't out of anything.  Encouraged her to check her bp and let office know if it is over 140 -pt to follow up in July.  She is to contact office sooner prn

## 2021-02-20 ENCOUNTER — Other Ambulatory Visit: Payer: Self-pay | Admitting: *Deleted

## 2021-02-20 DIAGNOSIS — I739 Peripheral vascular disease, unspecified: Secondary | ICD-10-CM

## 2021-02-20 DIAGNOSIS — I6523 Occlusion and stenosis of bilateral carotid arteries: Secondary | ICD-10-CM

## 2021-02-20 DIAGNOSIS — I714 Abdominal aortic aneurysm, without rupture, unspecified: Secondary | ICD-10-CM

## 2021-03-12 ENCOUNTER — Other Ambulatory Visit: Payer: Self-pay | Admitting: Physician Assistant

## 2021-03-12 MED ORDER — METOPROLOL TARTRATE 100 MG PO TABS: 100.0000 mg | ORAL_TABLET | Freq: Two times a day (BID) | ORAL | 0 refills | Status: DC

## 2021-03-26 ENCOUNTER — Other Ambulatory Visit: Payer: Self-pay | Admitting: Physician Assistant

## 2021-03-26 DIAGNOSIS — I2510 Atherosclerotic heart disease of native coronary artery without angina pectoris: Secondary | ICD-10-CM

## 2021-03-26 DIAGNOSIS — E785 Hyperlipidemia, unspecified: Secondary | ICD-10-CM

## 2021-03-26 DIAGNOSIS — I10 Essential (primary) hypertension: Secondary | ICD-10-CM

## 2021-04-08 ENCOUNTER — Ambulatory Visit: Payer: Self-pay | Admitting: Physician Assistant

## 2021-04-14 ENCOUNTER — Other Ambulatory Visit: Payer: Self-pay | Admitting: Physician Assistant

## 2021-04-14 MED ORDER — LISINOPRIL 40 MG PO TABS: ORAL_TABLET | ORAL | 0 refills | Status: DC

## 2021-04-14 MED ORDER — ATORVASTATIN CALCIUM 80 MG PO TABS: ORAL_TABLET | ORAL | 0 refills | Status: DC

## 2021-04-14 MED ORDER — FUROSEMIDE 20 MG PO TABS: 20.0000 mg | ORAL_TABLET | Freq: Every day | ORAL | 0 refills | Status: DC

## 2021-04-14 MED ORDER — METOPROLOL TARTRATE 100 MG PO TABS: 100.0000 mg | ORAL_TABLET | Freq: Two times a day (BID) | ORAL | 0 refills | Status: DC

## 2021-04-14 MED ORDER — CLONIDINE HCL 0.2 MG PO TABS: 0.2000 mg | ORAL_TABLET | Freq: Two times a day (BID) | ORAL | 0 refills | Status: DC

## 2021-04-14 MED ORDER — ALBUTEROL SULFATE HFA 108 (90 BASE) MCG/ACT IN AERS: INHALATION_SPRAY | RESPIRATORY_TRACT | 0 refills | Status: DC

## 2021-04-14 MED ORDER — AMLODIPINE BESYLATE 10 MG PO TABS: 10.0000 mg | ORAL_TABLET | Freq: Every day | ORAL | 1 refills | Status: DC

## 2021-04-15 ENCOUNTER — Encounter: Payer: Self-pay | Admitting: Physician Assistant

## 2021-04-16 ENCOUNTER — Ambulatory Visit (INDEPENDENT_AMBULATORY_CARE_PROVIDER_SITE_OTHER)
Admission: RE | Admit: 2021-04-16 | Discharge: 2021-04-16 | Disposition: A | Discharge status: Home / Self Care | Payer: Self-pay | Source: Ambulatory Visit | Attending: Vascular Surgery | Admitting: Vascular Surgery

## 2021-04-16 ENCOUNTER — Ambulatory Visit (HOSPITAL_COMMUNITY)
Admission: RE | Admit: 2021-04-16 | Discharge: 2021-04-16 | Disposition: A | Discharge status: Home / Self Care | Payer: Self-pay | Source: Ambulatory Visit | Attending: Vascular Surgery | Admitting: Vascular Surgery

## 2021-04-16 ENCOUNTER — Ambulatory Visit (INDEPENDENT_AMBULATORY_CARE_PROVIDER_SITE_OTHER): Payer: Self-pay | Admitting: Vascular Surgery

## 2021-04-16 ENCOUNTER — Other Ambulatory Visit: Payer: Self-pay

## 2021-04-16 ENCOUNTER — Encounter: Payer: Self-pay | Admitting: Vascular Surgery

## 2021-04-16 VITALS — BP 163/84 | HR 61 | Temp 97.2°F | Resp 14 | Ht 63.0 in | Wt 198.8 lb

## 2021-04-16 DIAGNOSIS — I739 Peripheral vascular disease, unspecified: Secondary | ICD-10-CM | POA: Insufficient documentation

## 2021-04-16 DIAGNOSIS — I6523 Occlusion and stenosis of bilateral carotid arteries: Secondary | ICD-10-CM | POA: Insufficient documentation

## 2021-04-16 DIAGNOSIS — I714 Abdominal aortic aneurysm, without rupture, unspecified: Secondary | ICD-10-CM

## 2021-04-16 NOTE — Progress Notes (Signed)
REASON FOR VISIT:   Follow-up of abdominal aortic aneurysm, peripheral vascular disease, and carotid disease.  MEDICAL ISSUES:   ABDOMINAL AORTIC ANEURYSM: Her aneurysm has not changed in size and remains 4.5 cm in maximum diameter.  She understands we would not consider elective repair unless it reached 5.5 cm in maximum diameter.  I have ordered a follow-up study in 6 months and I will see her back at that time.  We have again discussed the importance of tobacco cessation.  PERIPHERAL VASCULAR DISEASE: Patient does have stable claudication although some of her pain may be related to arthritis in her knees.  She has a diminished femoral pulses.  Her ABIs however were reasonable today.  I have ordered follow-up ABIs in 1 year.  I encouraged her to stay as active as possible.  BILATERAL CAROTID DISEASE: She has had a redo right carotid endarterectomy which is widely patent.  She will need a yearly follow-up study.  I have ordered a follow-up carotid duplex scan in 1 year.  She has a known left internal carotid artery occlusion.   HPI:   [name] is a pleasant 60 y.o. female who I last saw on 10/16/2020.  I have been following her with multiple vascular issues.  The patient has a 4.5 cm infrarenal abdominal aortic aneurysm.  I ordered a follow-up study in 6 months.  We discussed the importance of tobacco cessation.  Patient also has peripheral vascular disease with claudication.  I ordered ABIs when she returns for her 5-month follow-up visit.  Finally she has bilateral carotid disease.  She has a known left internal carotid artery occlusion.  She underwent a redo right carotid endarterectomy in May 2019.  She was due for a follow-up study when she returns for her next visit.  Since I saw her last, she denies any history of abdominal pain or back pain.  She does have stable claudication bilaterally from her knees down.  I think some of the pain is related to arthritis.  She gets some  symptoms also with standing.  She denies any history of rest pain or nonhealing ulcers.    She denies any history of stroke, TIAs, expressive or receptive aphasia, or amaurosis fugax.  Past Medical History:  Diagnosis Date   AAA (abdominal aortic aneurysm) without rupture (HCC) 08/25/2016   Anxiety    Arthritis    Bipolar 1 disorder (HCC)    CAD (coronary artery disease)    a. 08/08/16 LAD 70%, 60% 1st diag, CTO RCA Med Rx EF 55%   Carotid artery stenosis    Right carotid endarterectomy 07/22/05 with recurrent stenosis 01/2017; known left ICA occlusion   COPD (chronic obstructive pulmonary disease) (HCC)    Crack cocaine use    Depression    Headache    Hypertension    Myocardial infarction [facility])    Right Groin Hematoma    a. 07/2016 following diagnostic cath   Stroke [facility]) 2017    Family History  Problem Relation Age of Onset   Heart attack Mother    Stroke Mother    Hypertension Mother    Diabetes Mother    Heart disease Mother    Heart attack Father    Hypertension Father    Heart disease Father    Cancer Maternal Aunt    Cancer Maternal Uncle    COPD Paternal Uncle        2 pat uncles with COPD   Cancer Paternal Uncle  1 pat uncle with cancer    SOCIAL HISTORY: Social History   Tobacco Use   Smoking status: Every Day    Packs/day: 0.50    Years: 42.00    Pack years: 21.00    Types: Cigarettes    Start date: 10/05/1974   Smokeless tobacco: Never  Substance Use Topics   Alcohol use: No    Allergies  Allergen Reactions   Penicillins Hives and Swelling    PATIENT HAS HAD A PCN REACTION WITH IMMEDIATE RASH, FACIAL/TONGUE/THROAT SWELLING, SOB, OR LIGHTHEADEDNESS WITH HYPOTENSION:  #  #  YES  #  # HAS PT DEVELOPED SEVERE RASH INVOLVING MUCUS MEMBRANES or SKIN NECROSIS: #  #  YES  #  # Has patient had a PCN reaction that required hospitalization No Has patient had a PCN reaction occurring within the last 10 years: Non If all of the above answers are  "NO", then may proceed with Cephalosporin use.     Current Outpatient Medications  Medication Sig Dispense Refill   albuterol (PROVENTIL HFA) 108 (90 Base) MCG/ACT inhaler INHALE 2 PUFFS BY MOUTH EVERY 6 HOURS AS NEEDED FOR COUGHING, WHEEZING, OR SHORTNESS OF BREATH 3 each 0   amLODipine (NORVASC) 10 MG tablet Take 1 tablet (10 mg total) by mouth daily. 90 tablet 1   aspirin 325 MG tablet Take 325 mg by mouth daily.     atorvastatin (LIPITOR) 80 MG tablet TAKE 1 Tablet BY MOUTH ONCE EVERY DAY AT 6PM 90 tablet 0   cloNIDine (CATAPRES) 0.2 MG tablet Take 1 tablet (0.2 mg total) by mouth 2 (two) times daily. 180 tablet 0   Fluticasone-Salmeterol (ADVAIR) 100-50 MCG/DOSE AEPB INHALE 1 PUFF BY MOUTH TWICE DAILY (EVERY 12 HOURS). RINSE MOUTH AFTER USE. (MORNING AND BEDTIME) (Patient not taking: Reported on 02/03/2021) 120 each 1   furosemide (LASIX) 20 MG tablet Take 1 tablet (20 mg total) by mouth daily. 90 tablet 0   lisinopril (ZESTRIL) 40 MG tablet TAKE 1 Tablet BY MOUTH ONCE EVERY DAY 90 tablet 0   metoprolol tartrate (LOPRESSOR) 100 MG tablet Take 1 tablet (100 mg total) by mouth 2 (two) times daily. 180 tablet 0   nitroGLYCERIN (NITROSTAT) 0.4 MG SL tablet PLACE 1 TABLET UNDER TONGUE AS NEEDED FOR CHEST PAIN EVERY 5 MINUTES X 3 MAX DOSES.  CALL 911 IF PAIN PERSISTS 25 tablet 3   No current facility-administered medications for this visit.    REVIEW OF SYSTEMS:  [X]  denotes positive finding, [ ]  denotes negative finding Cardiac  Comments:  Chest pain or chest pressure:    Shortness of breath upon exertion:    Short of breath when lying flat:    Irregular heart rhythm:        Vascular    Pain in calf, thigh, or hip brought on by ambulation:    Pain in feet at night that wakes you up from your sleep:     Blood clot in your veins:    Leg swelling:         Pulmonary    Oxygen at home:    Productive cough:     Wheezing:         Neurologic    Sudden weakness in arms or legs:      Sudden numbness in arms or legs:     Sudden onset of difficulty speaking or slurred speech:    Temporary loss of vision in one eye:     Problems with dizziness:  Gastrointestinal    Blood in stool:     Vomited blood:         Genitourinary    Burning when urinating:     Blood in urine:        Psychiatric    Major depression:         Hematologic    Bleeding problems:    Problems with blood clotting too easily:        Skin    Rashes or ulcers:        Constitutional    Fever or chills:     PHYSICAL EXAM:   Vitals:   04/16/21 1041  BP: (!) 163/84  Pulse: 61  Resp: 14  Temp: (!) 97.2 F (36.2 C)  TempSrc: Temporal  SpO2: 94%  Weight: 198 lb 12.8 oz (90.2 kg)  Height: 5\' 3"  (1.6 m)    GENERAL: The patient is a well-nourished female, in no acute distress. The vital signs are documented above. CARDIAC: There is a regular rate and rhythm.  VASCULAR: She has a right carotid bruit. She has diminished but palpable femoral pulses. I cannot palpate pedal pulses. PULMONARY: There is good air exchange bilaterally without wheezing or rales. ABDOMEN: Soft and non-tender with normal pitched bowel sounds.  It is difficult to assess her aneurysm because of her body habitus. MUSCULOSKELETAL: There are no major deformities or cyanosis. NEUROLOGIC: No focal weakness or paresthesias are detected. SKIN: There are no ulcers or rashes noted. PSYCHIATRIC: The patient has a normal affect.  DATA:    DUPLEX ABDOMINAL AORTA: I have independently interpreted her duplex of the abdominal aorta.  The maximum diameter of aorta is 4.5 cm which is not changed since her study 6 months ago.  ARTERIAL DOPPLER STUDY: I have independently interpreted her arterial Doppler study today.  On the right side there is a biphasic dorsalis pedis and posterior tibial signal.  ABIs 100%.  Toe pressures 84 mmHg.  On the left side there is a biphasic dorsalis pedis and posterior tibial signal.  ABI is  89%.  Toe pressures 115 mmHg.  CAROTID DUPLEX: I have independently interpreted her carotid duplex scan today.  The right carotid endarterectomy site is widely patent without evidence of restenosis.  The right vertebral artery is patent with antegrade flow.  On the left side the ICA is occluded as is the common carotid artery.  The left vertebral artery has antegrade flow which is somewhat dampened.      [facility] [phone]

## 2021-04-18 ENCOUNTER — Other Ambulatory Visit: Payer: Self-pay

## 2021-04-18 DIAGNOSIS — I714 Abdominal aortic aneurysm, without rupture, unspecified: Secondary | ICD-10-CM

## 2021-04-21 ENCOUNTER — Other Ambulatory Visit: Payer: Self-pay | Admitting: Physician Assistant

## 2021-04-23 ENCOUNTER — Ambulatory Visit: Payer: Self-pay | Admitting: Cardiology

## 2021-05-05 NOTE — Progress Notes (Signed)
Cardiology Office Note  Date: 05/06/2021   ID: [name], DOB 12/03/1960, MRN [id number]  PCP:  Jacquelin HawkingMcElroy, Shannon, PA-C  Cardiologist:  Nona DellSamuel McDowell, MD Electrophysiologist:  None   Chief Complaint: 7 month follow up  History of Present Illness: [name] is a 60 y.o. female with a history of AAA, unstable angina, CAD, HTN, Carotid artery disease, ischemic cardiomyopathy,tobacco abuse, cocaine use, HLD.  She was last seen by Dr. Diona BrownerMcDowell on 06/20/2020.  She presented for routine visit.  She did not report any anginal symptoms.  Plan was to continue medical therapy and observation.  She was continuing aspirin, Norvasc, lisinopril, Lopressor, and Lipitor.  Blood pressure was well controlled on multiple agents.  She had been under a lot of stress with her family.  Carotid Dopplers in April 2021 demonstrated chronic occlusion of LICA with only mild stenosis of the R ICA.  She was asymptomatic.  She was continuing high-dose statin therapy.  Plan was for follow-up carotid Dopplers the following April and continuing aspirin and statin.   She has been following with Dr Edilia Boickson for her vascular issues and had recent ABI's , carotids, and US for AAA, July 2022. See reports below.  She had recent follow-up with Dr. Edilia Boickson on 04/16/2021.  She is here for 448-month follow-up.  He denies any issues other than pain in her legs which is chronic.  She had recent lower extremity ABIs with some mild disease noted in her left lower extremity.  She believes her pain is related to her arthritis.  She states she has significant pain in her knees without any real relief.  Her blood pressure is reasonably well controlled on current agents including amlodipine 10 mg daily, clonidine 0.2 mg p.o. twice daily, furosemide 20 mg p.o. daily, lisinopril 40 mg p.o. daily, metoprolol 200 mg p.o. twice daily.  She states she has been trying to get disability but so far not eligible.  She denies any anginal or  exertional symptoms.  She continues to smoke.  EKG today shows sinus bradycardia with sinus arrhythmia rate of 54.  Denies any CVA or TIA-like symptoms.  Denies any orthostatic symptoms.  No palpitations or arrhythmias.  No PND or orthopnea.  No bleeding.  DVT or PE-like symptoms, or lower extremity edema.  Past Medical History:  Diagnosis Date   AAA (abdominal aortic aneurysm) without rupture (HCC) 08/25/2016   Anxiety    Arthritis    Bipolar 1 disorder (HCC)    CAD (coronary artery disease)    a. 08/08/16 LAD 70%, 60% 1st diag, CTO RCA Med Rx EF 55%   Carotid artery stenosis    Right carotid endarterectomy 07/22/05 with recurrent stenosis 01/2017; known left ICA occlusion   COPD (chronic obstructive pulmonary disease) (HCC)    Crack cocaine use    Depression    Headache    Hypertension    Myocardial infarction [facility](HCC)    Right Groin Hematoma    a. 07/2016 following diagnostic cath   Stroke [facility](HCC) 2017    Past Surgical History:  Procedure Laterality Date   CARDIAC CATHETERIZATION N/A 08/07/2016   Procedure: Left Heart Cath and Coronary Angiography;  Surgeon: Lennette Biharihomas A Kelly, MD;  Location: [facility] INVASIVE CV LAB;  Service: Cardiovascular;  Laterality: N/A;   CYST REMOVAL TRUNK Left    beneath L breast   ENDARTERECTOMY Right 02/08/2018   Procedure: ENDARTERECTOMY CAROTID REDO;  Surgeon: Chuck Hintickson, Christopher S, MD;  [facility];  Service: Vascular;  Laterality:  Right;   TUBAL LIGATION     VASCULAR SURGERY Right    right carotid endarterectomy 07/22/05     Current Outpatient Medications  Medication Sig Dispense Refill   albuterol (PROVENTIL HFA) 108 (90 Base) MCG/ACT inhaler INHALE 2 PUFFS BY MOUTH EVERY 6 HOURS AS NEEDED FOR COUGHING, WHEEZING, OR SHORTNESS OF BREATH 3 each 0   amLODipine (NORVASC) 10 MG tablet Take 1 tablet (10 mg total) by mouth daily. 90 tablet 1   aspirin 325 MG tablet Take 325 mg by mouth daily.     atorvastatin (LIPITOR) 80 MG tablet TAKE 1 Tablet BY MOUTH  ONCE EVERY DAY AT 6PM 90 tablet 0   cloNIDine (CATAPRES) 0.2 MG tablet Take 1 tablet (0.2 mg total) by mouth 2 (two) times daily. 180 tablet 0   Fluticasone-Salmeterol (ADVAIR) 100-50 MCG/DOSE AEPB INHALE 1 PUFF BY MOUTH TWICE DAILY (EVERY 12 HOURS). RINSE MOUTH AFTER USE. (MORNING AND BEDTIME) 120 each 1   furosemide (LASIX) 20 MG tablet Take 1 tablet (20 mg total) by mouth daily. 90 tablet 0   lisinopril (ZESTRIL) 40 MG tablet TAKE 1 Tablet BY MOUTH ONCE EVERY DAY 90 tablet 0   metoprolol tartrate (LOPRESSOR) 100 MG tablet Take 2 tablets (200 mg total) by mouth 2 (two) times daily. 180 tablet 0   nitroGLYCERIN (NITROSTAT) 0.4 MG SL tablet PLACE 1 TABLET UNDER TONGUE AS NEEDED FOR CHEST PAIN EVERY 5 MINUTES X 3 MAX DOSES.  CALL 911 IF PAIN PERSISTS 25 tablet 3   No current facility-administered medications for this visit.   Allergies:  Penicillins   Social History: The patient  reports that she has been smoking cigarettes. She started smoking about 46 years ago. She has a 21.00 pack-year smoking history. She has never used smokeless tobacco. She reports current drug use. Frequency: 2.00 times per week. Drugs: Marijuana and "Crack" cocaine. She reports that she does not drink alcohol.   Family History: The patient's family history includes COPD in her paternal uncle; Cancer in her maternal aunt, maternal uncle, and paternal uncle; Diabetes in her mother; Heart attack in her father and mother; Heart disease in her father and mother; Hypertension in her father and mother; Stroke in her mother.   ROS:  Please see the history of present illness. Otherwise, complete review of systems is positive for none.  All other systems are reviewed and negative.   Physical Exam: VS:  BP 136/70   Pulse (!) 56   Ht 5\' 3"  (1.6 m)   Wt 199 lb 9.6 oz (90.5 kg)   LMP 07/18/2011   SpO2 97%   BMI 35.36 kg/m , BMI Body mass index is 35.36 kg/m.  Wt Readings from Last 3 Encounters:  05/06/21 199 lb 9.6 oz (90.5  kg)  04/16/21 198 lb 12.8 oz (90.2 kg)  02/03/21 197 lb 9.6 oz (89.6 kg)    General: Patient appears comfortable at rest. Neck: Supple, no elevated JVP or carotid bruits, no thyromegaly. Lungs: Clear to auscultation, nonlabored breathing at rest. Cardiac: Regular rate and rhythm, no S3 or significant systolic murmur, no pericardial rub. Extremities: No pitting edema, distal pulses 2+. Skin: Warm and dry. Musculoskeletal: No kyphosis. Neuropsychiatric: Alert and oriented x3, affect grossly appropriate.  ECG: 05/06/2021 EKG sinus bradycardia with sinus arrhythmia rate of 54.  Inferior infarct age undetermined.  Recent Labwork: 10/25/2020: ALT 18; AST 22; BUN 20; Creatinine, Ser 0.73; Potassium 3.6; Sodium 139     Component Value Date/Time   CHOL 157 10/25/2020  1131   TRIG 202 (H) 10/25/2020 1131   HDL 29 (L) 10/25/2020 1131   CHOLHDL 5.4 10/25/2020 1131   VLDL 40 10/25/2020 1131   LDLCALC 88 10/25/2020 1131    Other Studies Reviewed Today:  ABI 04/16/2021 Summary: Right: Resting right ankle-brachial index is within normal range. No evidence of significant right lower extremity arterial disease. The right toe-brachial index is abnormal. Left: Resting left ankle-brachial index indicates mild left lower extremity arterial disease. The left toe- brachial index is normal.  Carotid artery duplex 04/16/2021 Right Carotid: Velocities in the right ICA are consistent with a 1-39% stenosis. Left Carotid: Evidence consistent with a total occlusion of the left ICA. Vertebrals: Bilateral vertebral arteries demonstrate antegrade flow. Subclavians: Normal flow hemodynamics were seen in bilateral subclavian arteries.   [place] AAA 04/16/2021 Summary: Abdominal Aorta: The largest aortic diameter remains essentially unchanged compared CT. Previous diameter measurement was 4.5 cm obtained on 08/19/2020.   Echocardiogram 11/02/2019:  1. Left ventricular ejection fraction, by visual estimation, is 60 to   65%. The left ventricle has normal function. There is mildly increased  left ventricular hypertrophy.   2. Left ventricular diastolic parameters are consistent with Grade I  diastolic dysfunction (impaired relaxation).   3. The left ventricle has no regional wall motion abnormalities.   4. Global right ventricle has normal systolic function.The right  ventricular size is normal. No increase in right ventricular wall  thickness.   5. Left atrial size was mildly dilated.   6. Right atrial size was normal.   7. Mild mitral annular calcification.   8. The mitral valve is grossly normal. Mild mitral valve regurgitation.   9. The tricuspid valve is grossly normal. Tricuspid valve regurgitation  is trivial.  10. The aortic valve was not well visualized. Aortic valve regurgitation  is not visualized.  11. The pulmonic valve was grossly normal. Pulmonic valve regurgitation is  trivial.  12. Normal pulmonary artery systolic pressure.  13. The tricuspid regurgitant velocity is 2.27 m/s, and with an assumed  right atrial pressure of 3 mmHg, the estimated right ventricular systolic  pressure is normal at 23.6 mmHg.  14. The inferior vena cava is normal in size with greater than 50%  respiratory variability, suggesting right atrial pressure of 3 mmHg.   Carotid Dopplers 01/04/2020: Summary:  Right Carotid: Velocities in the right ICA are consistent with a 1-39%  stenosis.  Left Carotid: Evidence consistent with a total occlusion of the left ICA. The  CCA appears occluded.  Vertebrals:  Right vertebral artery demonstrates antegrade flow. Left  vertebral artery demonstrates no discernable flow.  Subclavians: Normal flow hemodynamics were seen in bilateral subclavian arteries. Assessment and Plan:  1. CAD in native artery   2. Essential hypertension   3. Mixed hyperlipidemia   4. Carotid stenosis, bilateral   5. PVD (peripheral vascular disease) (HCC)   6. AAA (abdominal aortic aneurysm)  without rupture (HCC)   7. Tobacco abuse    1. CAD in native artery Denies any anginal or exertional symptoms.  Continue aspirin 325 mg daily.  Continue sublingual nitroglycerin as needed.    2. Essential hypertension BP 136/70 today.  Continue metoprolol 200 mg p.o. twice daily.  Amlodipine 10 mg p.o. daily, clonidine 0.2 mg p.o. twice daily, lisinopril 40 mg p.o.daily.  Continue Lasix 20 mg p.o. daily.  3. Mixed hyperlipidemia Continue atorvastatin 80 mg p.o. daily.  Last lipid panel on 10/25/2020: TC 157, TG 202, HDL 29, LDL 88  4. Carotid  stenosis, bilateral Carotid artery duplex 04/16/2021: RICA 1 to 39% stenosis.  LICA total occlusion, vertebrals bilateral vertebral arteries demonstrated antegrade flow.  Normal flow hemodynamics seen in bilateral subclavian arteries.  Following with vein and vascular Dr. Edilia Bo  5. PVD (peripheral vascular disease) (HCC) Recent ABIs on 04/16/2021 showed right ABI within normal range.  No evidence of significant lower extremity arterial disease.  Right TBI was abnormal.  Left resting left ankle-brachial index indicated mild left lower extremity arterial disease.  Left TBI was normal.  Following vein and vascular Dr. Edilia Bo  6. Abdominal aortic aneurysm Recent follow up US for AAA on 04/16/2021.  The largest diameter abdominal aorta remained unchanged compared to CT previous diameter was 4.5 cm on 08/19/2020.  Following vein and vascular Dr. Edilia Bo  7.  Smoking She continues to smoke.  I discussed the deleterious effect on her lungs, heart heart, her aortic aneurysm and association with progression of vascular disease.  She understands.  She states it is hard to quit.  Encouraged cessation.  Medication Adjustments/Labs and Tests Ordered: Current medicines are reviewed at length with the patient today.  Concerns regarding medicines are outlined above.   Disposition: Follow-up with Dr. Diona Browner or APP 6 months  Signed, Rennis Harding, NP 05/06/2021 3:34  PM    [facility] at [facility] [address] [place], [place], [place] [address] Phone: [phone]; Fax: [phone]

## 2021-05-06 ENCOUNTER — Ambulatory Visit (INDEPENDENT_AMBULATORY_CARE_PROVIDER_SITE_OTHER): Payer: Self-pay | Admitting: Family Medicine

## 2021-05-06 ENCOUNTER — Other Ambulatory Visit: Payer: Self-pay

## 2021-05-06 ENCOUNTER — Encounter: Payer: Self-pay | Admitting: Family Medicine

## 2021-05-06 VITALS — BP 136/70 | HR 56 | Ht 63.0 in | Wt 199.6 lb

## 2021-05-06 DIAGNOSIS — I739 Peripheral vascular disease, unspecified: Secondary | ICD-10-CM

## 2021-05-06 DIAGNOSIS — I714 Abdominal aortic aneurysm, without rupture, unspecified: Secondary | ICD-10-CM

## 2021-05-06 DIAGNOSIS — E782 Mixed hyperlipidemia: Secondary | ICD-10-CM

## 2021-05-06 DIAGNOSIS — Z720 Tobacco use: Secondary | ICD-10-CM

## 2021-05-06 DIAGNOSIS — I6523 Occlusion and stenosis of bilateral carotid arteries: Secondary | ICD-10-CM

## 2021-05-06 DIAGNOSIS — I10 Essential (primary) hypertension: Secondary | ICD-10-CM

## 2021-05-06 DIAGNOSIS — I2510 Atherosclerotic heart disease of native coronary artery without angina pectoris: Secondary | ICD-10-CM

## 2021-05-06 NOTE — Patient Instructions (Addendum)

## 2021-05-29 ENCOUNTER — Other Ambulatory Visit: Payer: Self-pay

## 2021-05-29 ENCOUNTER — Other Ambulatory Visit (HOSPITAL_COMMUNITY)
Admission: RE | Admit: 2021-05-29 | Discharge: 2021-05-29 | Disposition: A | Discharge status: Home / Self Care | Payer: Self-pay | Source: Ambulatory Visit | Attending: Physician Assistant | Admitting: Physician Assistant

## 2021-05-29 DIAGNOSIS — I2510 Atherosclerotic heart disease of native coronary artery without angina pectoris: Secondary | ICD-10-CM | POA: Insufficient documentation

## 2021-05-29 DIAGNOSIS — E785 Hyperlipidemia, unspecified: Secondary | ICD-10-CM | POA: Insufficient documentation

## 2021-05-29 DIAGNOSIS — I10 Essential (primary) hypertension: Secondary | ICD-10-CM | POA: Insufficient documentation

## 2021-05-29 LAB — COMPREHENSIVE METABOLIC PANEL
ALT: 16 U/L (ref 0–44)
AST: 19 U/L (ref 15–41)
Albumin: 3.7 g/dL (ref 3.5–5.0)
Alkaline Phosphatase: 107 U/L (ref 38–126)
Anion gap: 4 — ABNORMAL LOW (ref 5–15)
BUN: 14 mg/dL (ref 6–20)
CO2: 27 mmol/L (ref 22–32)
Calcium: 8.7 mg/dL — ABNORMAL LOW (ref 8.9–10.3)
Chloride: 105 mmol/L (ref 98–111)
Creatinine, Ser: 0.77 mg/dL (ref 0.44–1.00)
GFR, Estimated: >60 mL/min (ref >60)
Glucose, Bld: 112 mg/dL — ABNORMAL HIGH (ref 70–99)
Potassium: 4.2 mmol/L (ref 3.5–5.1)
Sodium: 136 mmol/L (ref 135–145)
Total Bilirubin: 0.5 mg/dL (ref 0.3–1.2)
Total Protein: 7 g/dL (ref 6.5–8.1)

## 2021-05-29 LAB — LIPID PANEL
Cholesterol: 254 mg/dL — ABNORMAL HIGH (ref 0–200)
HDL: 32 mg/dL — ABNORMAL LOW (ref >40)
LDL Cholesterol: 172 mg/dL — ABNORMAL HIGH (ref 0–99)
Total CHOL/HDL Ratio: 7.9 RATIO
Triglycerides: 250 mg/dL — ABNORMAL HIGH (ref <150)
VLDL: 50 mg/dL — ABNORMAL HIGH (ref 0–40)

## 2021-06-03 ENCOUNTER — Other Ambulatory Visit: Payer: Self-pay

## 2021-06-03 ENCOUNTER — Ambulatory Visit: Payer: Self-pay | Admitting: Physician Assistant

## 2021-06-03 ENCOUNTER — Encounter: Payer: Self-pay | Admitting: Physician Assistant

## 2021-06-03 VITALS — BP 117/79 | HR 44 | Temp 97.9°F | Wt 201.0 lb

## 2021-06-03 DIAGNOSIS — I779 Disorder of arteries and arterioles, unspecified: Secondary | ICD-10-CM

## 2021-06-03 DIAGNOSIS — I714 Abdominal aortic aneurysm, without rupture, unspecified: Secondary | ICD-10-CM

## 2021-06-03 DIAGNOSIS — I2510 Atherosclerotic heart disease of native coronary artery without angina pectoris: Secondary | ICD-10-CM

## 2021-06-03 DIAGNOSIS — Z5320 Procedure and treatment not carried out because of patient's decision for unspecified reasons: Secondary | ICD-10-CM

## 2021-06-03 DIAGNOSIS — F17200 Nicotine dependence, unspecified, uncomplicated: Secondary | ICD-10-CM

## 2021-06-03 DIAGNOSIS — I10 Essential (primary) hypertension: Secondary | ICD-10-CM

## 2021-06-03 DIAGNOSIS — E785 Hyperlipidemia, unspecified: Secondary | ICD-10-CM

## 2021-06-03 DIAGNOSIS — J449 Chronic obstructive pulmonary disease, unspecified: Secondary | ICD-10-CM

## 2021-06-03 DIAGNOSIS — F1290 Cannabis use, unspecified, uncomplicated: Secondary | ICD-10-CM

## 2021-06-03 DIAGNOSIS — Z1211 Encounter for screening for malignant neoplasm of colon: Secondary | ICD-10-CM

## 2021-06-03 MED ORDER — FUROSEMIDE 20 MG PO TABS: 20.0000 mg | ORAL_TABLET | Freq: Every day | ORAL | 0 refills | Status: DC

## 2021-06-03 MED ORDER — METOPROLOL TARTRATE 100 MG PO TABS: 200.0000 mg | ORAL_TABLET | Freq: Two times a day (BID) | ORAL | 1 refills | Status: DC

## 2021-06-03 MED ORDER — LISINOPRIL 40 MG PO TABS: ORAL_TABLET | ORAL | 0 refills | Status: DC

## 2021-06-03 MED ORDER — ALBUTEROL SULFATE HFA 108 (90 BASE) MCG/ACT IN AERS: INHALATION_SPRAY | RESPIRATORY_TRACT | 0 refills | Status: DC

## 2021-06-03 MED ORDER — NITROGLYCERIN 0.4 MG SL SUBL: SUBLINGUAL_TABLET | SUBLINGUAL | 99 refills | Status: DC

## 2021-06-03 MED ORDER — CLONIDINE HCL 0.2 MG PO TABS: 0.2000 mg | ORAL_TABLET | Freq: Two times a day (BID) | ORAL | 0 refills | Status: DC

## 2021-06-03 MED ORDER — AMLODIPINE BESYLATE 10 MG PO TABS: 10.0000 mg | ORAL_TABLET | Freq: Every day | ORAL | 1 refills | Status: DC

## 2021-06-03 MED ORDER — ATORVASTATIN CALCIUM 80 MG PO TABS: ORAL_TABLET | ORAL | 0 refills | Status: DC

## 2021-06-03 NOTE — Progress Notes (Signed)
BP 117/79   Pulse (!) 44   Temp 97.9 F (36.6 C)   Wt 201 lb (91.2 kg)   LMP 07/18/2011   SpO2 93%   BMI 35.61 kg/m    Subjective:    Patient ID: [name], female    DOB: [birth year], 60 y.o.   MRN: [id number]  HPI: DEONA NOVITSKI is a 60 y.o. female presenting on 06/03/2021 for Hypertension and Hyperlipidemia   HPI   Pt had a negative covid 19 screening questionnaire.  Chief Complaint  Patient presents with   Hypertension   Hyperlipidemia     Pt says she is avoiding cocaine but is still using MJ and is still smoking cigarettes   Pt had follow up with cardiology in August and vascular sugery in July.    When reviewing her Labs, pt says her daughter started working at [employer] and bringing home fatty foods.  She says she mailed her FIT test in July.  It was never received by clinic.  Pt has not gotten the covid vaccination and says she probably won't ever.  When asked about her reasons, she says she just doesn't like vaccinations.   Pt has no complaints today.  She says she is doing well.      Relevant past medical, surgical, family and social history reviewed and updated as indicated. Interim medical history since our last visit reviewed. Allergies and medications reviewed and updated.    Current Outpatient Medications:    albuterol (PROVENTIL HFA) 108 (90 Base) MCG/ACT inhaler, INHALE 2 PUFFS BY MOUTH EVERY 6 HOURS AS NEEDED FOR COUGHING, WHEEZING, OR SHORTNESS OF BREATH, Disp: 3 each, Rfl: 0   amLODipine (NORVASC) 10 MG tablet, Take 1 tablet (10 mg total) by mouth daily., Disp: 90 tablet, Rfl: 1   aspirin 325 MG tablet, Take 325 mg by mouth daily., Disp: , Rfl:    atorvastatin (LIPITOR) 80 MG tablet, TAKE 1 Tablet BY MOUTH ONCE EVERY DAY AT 6PM, Disp: 90 tablet, Rfl: 0   cloNIDine (CATAPRES) 0.2 MG tablet, Take 1 tablet (0.2 mg total) by mouth 2 (two) times daily., Disp: 180 tablet, Rfl: 0   furosemide (LASIX) 20 MG tablet, Take 1 tablet (20 mg  total) by mouth daily., Disp: 90 tablet, Rfl: 0   lisinopril (ZESTRIL) 40 MG tablet, TAKE 1 Tablet BY MOUTH ONCE EVERY DAY, Disp: 90 tablet, Rfl: 0   metoprolol tartrate (LOPRESSOR) 100 MG tablet, Take 2 tablets (200 mg total) by mouth 2 (two) times daily., Disp: 180 tablet, Rfl: 0   Fluticasone-Salmeterol (ADVAIR) 100-50 MCG/DOSE AEPB, INHALE 1 PUFF BY MOUTH TWICE DAILY (EVERY 12 HOURS). RINSE MOUTH AFTER USE. (MORNING AND BEDTIME) (Patient not taking: Reported on 06/03/2021), Disp: 120 each, Rfl: 1   nitroGLYCERIN (NITROSTAT) 0.4 MG SL tablet, PLACE 1 TABLET UNDER TONGUE AS NEEDED FOR CHEST PAIN EVERY 5 MINUTES X 3 MAX DOSES.  CALL 911 IF PAIN PERSISTS (Patient not taking: Reported on 06/03/2021), Disp: 25 tablet, Rfl: 3   Review of Systems  Per HPI unless specifically indicated above     Objective:    BP 117/79   Pulse (!) 44   Temp 97.9 F (36.6 C)   Wt 201 lb (91.2 kg)   LMP 07/18/2011   SpO2 93%   BMI 35.61 kg/m   Wt Readings from Last 3 Encounters:  06/03/21 201 lb (91.2 kg)  05/06/21 199 lb 9.6 oz (90.5 kg)  04/16/21 198 lb 12.8 oz (90.2 kg)  Physical Exam Vitals reviewed.  Constitutional:      General: She is not in acute distress.    Appearance: She is well-developed. She is not toxic-appearing.  HENT:     Head: Normocephalic and atraumatic.  Cardiovascular:     Rate and Rhythm: Normal rate and regular rhythm.  Pulmonary:     Effort: Pulmonary effort is normal.     Breath sounds: Normal breath sounds.  Abdominal:     General: Bowel sounds are normal.     Palpations: Abdomen is soft. There is no mass.     Tenderness: There is no abdominal tenderness.  Musculoskeletal:     Cervical back: Neck supple.     Right lower leg: Edema present.     Left lower leg: Edema present.     Comments: Trace BLE edema  Lymphadenopathy:     Cervical: No cervical adenopathy.  Skin:    General: Skin is warm and dry.  Neurological:     Mental Status: She is alert and oriented to  person, place, and time.  Psychiatric:        Behavior: Behavior normal.    Results for orders placed or performed during the hospital encounter of 05/29/21  Lipid panel  Result Value Ref Range   Cholesterol 254 (H) 0 - 200 mg/dL   Triglycerides 295 (H) <150 mg/dL   HDL 32 (L) >18 mg/dL   Total CHOL/HDL Ratio 7.9 RATIO   VLDL 50 (H) 0 - 40 mg/dL   LDL Cholesterol 841 (H) 0 - 99 mg/dL  Comprehensive metabolic panel  Result Value Ref Range   Sodium 136 135 - 145 mmol/L   Potassium 4.2 3.5 - 5.1 mmol/L   Chloride 105 98 - 111 mmol/L   CO2 27 22 - 32 mmol/L   Glucose, Bld 112 (H) 70 - 99 mg/dL   BUN 14 6 - 20 mg/dL   Creatinine, Ser 6.60 0.44 - 1.00 mg/dL   Calcium 8.7 (L) 8.9 - 10.3 mg/dL   Total Protein 7.0 6.5 - 8.1 g/dL   Albumin 3.7 3.5 - 5.0 g/dL   AST 19 15 - 41 U/L   ALT 16 0 - 44 U/L   Alkaline Phosphatase 107 38 - 126 U/L   Total Bilirubin 0.5 0.3 - 1.2 mg/dL   GFR, Estimated >63 >01 mL/min   Anion gap 4 (L) 5 - 15      Assessment & Plan:    Encounter Diagnoses  Name Primary?   Essential hypertension Yes   Dyslipidemia    Coronary artery disease involving native coronary artery of native heart without angina pectoris    Marijuana use    Screening for colon cancer    Tobacco use disorder    Chronic obstructive pulmonary disease, unspecified COPD type (HCC)    Screening mammography declined    AAA (abdominal aortic aneurysm) without rupture (HCC)    Bilateral carotid artery disease, unspecified type (HCC)      -reviewed labs with pt  HTN -well controlled.  Continue amlodipine, clonidine lisinopril metoprolol  Dyslipidemia -pt is to continue current atorvastatin.  She is encouraged to avoid the greasy foods from the restaurant she has been eating lately; she is encouraged to follow lowfat diet   HCM -pt was given another FIT test for colon cancer screening.   Enoucraged her to return it in person (and not mail it) -encouraged Covid vaccination -pt  Declined screening mammogram  COPD -She declines refill on adviar.  she says  her breathing is okay and she only occasionally needs the albuterol.  Encouraged smoking cessation   CAD -Pt to continue with cardiology as per their recommendations.  She has follow up there in February  Carotid stenosis PVD AAA -Pt to continue with vascular surgery per there recommendations.  Most recent abdominal US November 2021.  She is on recall for January 2023   -Medications refilled -pt to follow up 3 months.  She is to contact office sooner prn

## 2021-08-18 ENCOUNTER — Other Ambulatory Visit: Payer: Self-pay | Admitting: Physician Assistant

## 2021-08-18 DIAGNOSIS — I2510 Atherosclerotic heart disease of native coronary artery without angina pectoris: Secondary | ICD-10-CM

## 2021-08-18 DIAGNOSIS — Z131 Encounter for screening for diabetes mellitus: Secondary | ICD-10-CM

## 2021-08-18 DIAGNOSIS — R7309 Other abnormal glucose: Secondary | ICD-10-CM

## 2021-08-18 DIAGNOSIS — I10 Essential (primary) hypertension: Secondary | ICD-10-CM

## 2021-08-18 DIAGNOSIS — E785 Hyperlipidemia, unspecified: Secondary | ICD-10-CM

## 2021-08-26 ENCOUNTER — Other Ambulatory Visit: Payer: Self-pay | Admitting: Physician Assistant

## 2021-09-02 ENCOUNTER — Other Ambulatory Visit (HOSPITAL_COMMUNITY)
Admission: RE | Admit: 2021-09-02 | Discharge: 2021-09-02 | Disposition: A | Discharge status: Home / Self Care | Payer: Self-pay | Source: Ambulatory Visit | Attending: Physician Assistant | Admitting: Physician Assistant

## 2021-09-02 ENCOUNTER — Ambulatory Visit: Payer: Self-pay | Admitting: Physician Assistant

## 2021-09-02 ENCOUNTER — Encounter: Payer: Self-pay | Admitting: Physician Assistant

## 2021-09-02 DIAGNOSIS — I2510 Atherosclerotic heart disease of native coronary artery without angina pectoris: Secondary | ICD-10-CM | POA: Insufficient documentation

## 2021-09-02 DIAGNOSIS — I10 Essential (primary) hypertension: Secondary | ICD-10-CM | POA: Insufficient documentation

## 2021-09-02 DIAGNOSIS — I714 Abdominal aortic aneurysm, without rupture, unspecified: Secondary | ICD-10-CM

## 2021-09-02 DIAGNOSIS — E785 Hyperlipidemia, unspecified: Secondary | ICD-10-CM

## 2021-09-02 DIAGNOSIS — J069 Acute upper respiratory infection, unspecified: Secondary | ICD-10-CM

## 2021-09-02 DIAGNOSIS — Z5320 Procedure and treatment not carried out because of patient's decision for unspecified reasons: Secondary | ICD-10-CM

## 2021-09-02 DIAGNOSIS — J449 Chronic obstructive pulmonary disease, unspecified: Secondary | ICD-10-CM

## 2021-09-02 DIAGNOSIS — F1290 Cannabis use, unspecified, uncomplicated: Secondary | ICD-10-CM

## 2021-09-02 DIAGNOSIS — M25562 Pain in left knee: Secondary | ICD-10-CM

## 2021-09-02 DIAGNOSIS — I779 Disorder of arteries and arterioles, unspecified: Secondary | ICD-10-CM

## 2021-09-02 DIAGNOSIS — R7309 Other abnormal glucose: Secondary | ICD-10-CM | POA: Insufficient documentation

## 2021-09-02 DIAGNOSIS — F17200 Nicotine dependence, unspecified, uncomplicated: Secondary | ICD-10-CM

## 2021-09-02 DIAGNOSIS — Z131 Encounter for screening for diabetes mellitus: Secondary | ICD-10-CM | POA: Insufficient documentation

## 2021-09-02 LAB — LIPID PANEL
Cholesterol: 207 mg/dL — ABNORMAL HIGH (ref 0–200)
HDL: 39 mg/dL — ABNORMAL LOW (ref >40)
LDL Cholesterol: 143 mg/dL — ABNORMAL HIGH (ref 0–99)
Total CHOL/HDL Ratio: 5.3 RATIO
Triglycerides: 123 mg/dL (ref <150)
VLDL: 25 mg/dL (ref 0–40)

## 2021-09-02 LAB — COMPREHENSIVE METABOLIC PANEL
ALT: 19 U/L (ref 0–44)
AST: 20 U/L (ref 15–41)
Albumin: 3.6 g/dL (ref 3.5–5.0)
Alkaline Phosphatase: 98 U/L (ref 38–126)
Anion gap: 8 (ref 5–15)
BUN: 17 mg/dL (ref 6–20)
CO2: 27 mmol/L (ref 22–32)
Calcium: 8.8 mg/dL — ABNORMAL LOW (ref 8.9–10.3)
Chloride: 104 mmol/L (ref 98–111)
Creatinine, Ser: 0.66 mg/dL (ref 0.44–1.00)
GFR, Estimated: >60 mL/min (ref >60)
Glucose, Bld: 113 mg/dL — ABNORMAL HIGH (ref 70–99)
Potassium: 4.1 mmol/L (ref 3.5–5.1)
Sodium: 139 mmol/L (ref 135–145)
Total Bilirubin: 0.8 mg/dL (ref 0.3–1.2)
Total Protein: 6.9 g/dL (ref 6.5–8.1)

## 2021-09-02 LAB — HEMOGLOBIN A1C
Hgb A1c MFr Bld: 5.9 % — ABNORMAL HIGH (ref 4.8–5.6)
Mean Plasma Glucose: 122.63 mg/dL

## 2021-09-02 MED ORDER — DICLOFENAC SODIUM 75 MG PO TBEC: 75.0000 mg | DELAYED_RELEASE_TABLET | Freq: Two times a day (BID) | ORAL | 0 refills | Status: DC | PRN

## 2021-09-02 NOTE — Progress Notes (Signed)
LMP 07/18/2011    Subjective:    Patient ID: [name], female    DOB: 11-10-1960, 60 y.o.   MRN: [id number]  HPI: [name] is a 60 y.o. female presenting on 09/02/2021 for No chief complaint on file.   HPI  This is a telemedicine appointment through [employer]  I connected with  [name] on 09/02/21 by a video enabled telemedicine application and verified that I am speaking with the correct person using two identifiers.   I discussed the limitations of evaluation and management by telemedicine. The patient expressed understanding and agreed to proceed.  Pt is at her niece's house.  Provider is at office.    Pt is pleasant 60yoF with routine appointment to follow up dyslipidemia.    She says she is having Left knee pain that OTCs don't control.     She is still smoking cigarettes and MJ but no cocaine.  She has been Sick x 1 week.  She says she is Getting better.  She has some cough, Runny nose and congestion.  She has No ST or EA.  She has No sob.   She is not vaccinated for covid.  She did not take a covid test.    She says she is leaving town tomorrow to go to [place] to see her brother who is dying.      Relevant past medical, surgical, family and social history reviewed and updated as indicated. Interim medical history since our last visit reviewed. Allergies and medications reviewed and updated.   Current Outpatient Medications:    albuterol (VENTOLIN HFA) 108 (90 Base) MCG/ACT inhaler, INHALE 2 PUFFS BY MOUTH EVERY 6 HOURS AS NEEDED FOR COUGHING, WHEEZING, OR SHORTNESS OF BREATH, Disp: 20.1 g, Rfl: 0   amLODipine (NORVASC) 10 MG tablet, Take 1 tablet (10 mg total) by mouth daily., Disp: 90 tablet, Rfl: 1   aspirin 325 MG tablet, Take 325 mg by mouth daily., Disp: , Rfl:    atorvastatin (LIPITOR) 80 MG tablet, TAKE 1 Tablet BY MOUTH ONCE EVERY DAY at 6pm, Disp: 90 tablet, Rfl: 0   cloNIDine (CATAPRES) 0.2 MG tablet, TAKE 1 Tablet  BY MOUTH TWICE  DAILY, Disp: 180 tablet, Rfl: 0   furosemide (LASIX) 20 MG tablet, TAKE 1 Tablet BY MOUTH ONCE EVERY DAY, Disp: 90 tablet, Rfl: 0   lisinopril (ZESTRIL) 40 MG tablet, TAKE 1 Tablet BY MOUTH ONCE EVERY DAY, Disp: 90 tablet, Rfl: 0   metoprolol tartrate (LOPRESSOR) 100 MG tablet, TAKE 2 Tablets BY MOUTH TWICE DAILY, Disp: 180 tablet, Rfl: 0   nitroGLYCERIN (NITROSTAT) 0.4 MG SL tablet, PLACE 1 TABLET UNDER TONGUE AS NEEDED FOR CHEST PAIN EVERY 5 MINUTES X 3 MAX DOSES.  CALL 911 IF PAIN PERSISTS, Disp: 25 tablet, Rfl: PRN    Review of Systems  Per HPI unless specifically indicated above     Objective:    LMP 07/18/2011   Wt Readings from Last 3 Encounters:  06/03/21 201 lb (91.2 kg)  05/06/21 199 lb 9.6 oz (90.5 kg)  04/16/21 198 lb 12.8 oz (90.2 kg)    Physical Exam Constitutional:      General: She is not in acute distress.    Appearance: She is not toxic-appearing.  HENT:     Head: Normocephalic and atraumatic.  Pulmonary:     Effort: No respiratory distress.     Comments: Pt is talking in complete sentences without dyspnea Neurological:     Mental Status: She  is alert and oriented to person, place, and time.  Psychiatric:        Attention and Perception: Attention normal.        Speech: Speech normal.        Behavior: Behavior is cooperative.    Results for orders placed or performed during the hospital encounter of 09/02/21  Lipid panel  Result Value Ref Range   Cholesterol 207 (H) 0 - 200 mg/dL   Triglycerides 347 <425 mg/dL   HDL 39 (L) >95 mg/dL   Total CHOL/HDL Ratio 5.3 RATIO   VLDL 25 0 - 40 mg/dL   LDL Cholesterol 638 (H) 0 - 99 mg/dL  Comprehensive metabolic panel  Result Value Ref Range   Sodium 139 135 - 145 mmol/L   Potassium 4.1 3.5 - 5.1 mmol/L   Chloride 104 98 - 111 mmol/L   CO2 27 22 - 32 mmol/L   Glucose, Bld 113 (H) 70 - 99 mg/dL   BUN 17 6 - 20 mg/dL   Creatinine, Ser 7.56 0.44 - 1.00 mg/dL   Calcium 8.8 (L) 8.9 - 10.3 mg/dL   Total  Protein 6.9 6.5 - 8.1 g/dL   Albumin 3.6 3.5 - 5.0 g/dL   AST 20 15 - 41 U/L   ALT 19 0 - 44 U/L   Alkaline Phosphatase 98 38 - 126 U/L   Total Bilirubin 0.8 0.3 - 1.2 mg/dL   GFR, Estimated >43 >32 mL/min   Anion gap 8 5 - 15      Assessment & Plan:    Encounter Diagnoses  Name Primary?   Dyslipidemia Yes   Essential hypertension    Coronary artery disease involving native coronary artery of native heart without angina pectoris    Tobacco use disorder    Marijuana use    Chronic obstructive pulmonary disease, unspecified COPD type (HCC)    Upper respiratory tract infection, unspecified type    Screening mammography declined    Abdominal aortic aneurysm (AAA) without rupture, unspecified part    Bilateral carotid artery disease, unspecified type (HCC)    Left knee pain, unspecified chronicity       -reviewed labs with pt.  A1c is pending.  She will be called with that result when available. -pt is encouraged to watch lowfat diet as LDL is still elevated.  She is to continue the atorvastatin 80mg  daily.   -pt to continue all of her current meds -she is given rx for diclofenac for the knee pain.  She is counseled to take it with food as it can irritate the stomach. -she is congratulated on avoiding cocaine -encouraged smoking cessation -discussed with pt that she may want to go ahead and take a covid test since she is going to see her dying brother tomorrow.  Recommended she take the vaccination when she is better -pt declined screening mammogram -pt to follow up with vascular surgery in January and cardiology in February as scheduled -pt to follow up 3 months.  She is to contact office sooner prn

## 2021-10-03 ENCOUNTER — Other Ambulatory Visit: Payer: Self-pay

## 2021-10-03 DIAGNOSIS — I739 Peripheral vascular disease, unspecified: Secondary | ICD-10-CM

## 2021-10-03 DIAGNOSIS — I6523 Occlusion and stenosis of bilateral carotid arteries: Secondary | ICD-10-CM

## 2021-10-16 ENCOUNTER — Encounter: Payer: Self-pay | Admitting: Vascular Surgery

## 2021-10-16 ENCOUNTER — Other Ambulatory Visit: Payer: Self-pay

## 2021-10-16 ENCOUNTER — Ambulatory Visit (HOSPITAL_COMMUNITY)
Admission: RE | Admit: 2021-10-16 | Discharge: 2021-10-16 | Disposition: A | Discharge status: Home / Self Care | Payer: Self-pay | Source: Ambulatory Visit | Attending: Vascular Surgery | Admitting: Vascular Surgery

## 2021-10-16 ENCOUNTER — Ambulatory Visit (INDEPENDENT_AMBULATORY_CARE_PROVIDER_SITE_OTHER): Payer: Self-pay | Admitting: Vascular Surgery

## 2021-10-16 VITALS — BP 133/72 | HR 58 | Temp 97.9°F | Resp 20 | Ht 63.0 in | Wt 205.9 lb

## 2021-10-16 DIAGNOSIS — I7143 Infrarenal abdominal aortic aneurysm, without rupture: Secondary | ICD-10-CM

## 2021-10-16 DIAGNOSIS — I6523 Occlusion and stenosis of bilateral carotid arteries: Secondary | ICD-10-CM

## 2021-10-16 DIAGNOSIS — I739 Peripheral vascular disease, unspecified: Secondary | ICD-10-CM

## 2021-10-16 DIAGNOSIS — I714 Abdominal aortic aneurysm, without rupture, unspecified: Secondary | ICD-10-CM | POA: Insufficient documentation

## 2021-10-16 NOTE — Progress Notes (Signed)
REASON FOR VISIT:   Follow-up of multiple medical issues.  MEDICAL ISSUES:   ABDOMINAL AORTIC ANEURYSM: Her abdominal aortic aneurysm has increased only slightly in size from 4.5 to 4.7 cm.  She understands we would not consider elective repair in a normal risk patient unless it reached 5.5 cm in maximum diameter.  We have again discussed the importance of tobacco cessation.  I have explained that continued tobacco use does increase her risk of aneurysm expansion and rupture.  I ordered a follow-up duplex scan in 6 months and I will see her back at that time.  If the aneurysm continues to enlarge then we would likely need to get a CT angiogram to further evaluate her for a possible stent graft.  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  She would be at increased risk for open surgery given her heavy tobacco use and obesity.   PERIPHERAL VASCULAR DISEASE: The patient does describe some symptoms in her left leg at times which could be consistent with claudication.  She does not have any rest pain.  She has good Doppler signals in both feet but diminished femoral pulses.  I ordered follow-up ABIs in 6 months and I will see her back at that time.  We could evaluate this further with CT angiogram if her symptoms progress.    BILATERAL CAROTID DISEASE: She is undergone a previous redo right carotid endarterectomy.  On her most recent study 6 months ago this was patent.  She is due for a follow-up study in 6 months and I have arranged that.  She has a known left internal carotid artery occlusion.  HPI:   [name] is a pleasant 61 y.o. female who I last saw on 04/16/2021.  I have been following her with multiple medical issues.  She has an abdominal aortic aneurysm.  At the time of her last visit this had not changed in size and was 4.5 cm in maximum diameter.  I set her up for a 34-month follow-up duplex.  She does have  some mild chronic back pain but has had no new onset abdominal pain or back pain.  She does continue to smoke but is cut back to about 10 cigarettes a day.  I also following her with peripheral vascular disease.  She had stable claudication.  I do not get any history of rest pain or nonhealing ulcers.  Her symptoms are relatively stable.  In addition she is had a redo right carotid endarterectomy and at the time of her last visit this was widely patent.  She denies any history of stroke, TIAs, expressive or receptive aphasia, or amaurosis fugax.  She is on aspirin and a statin.  Past Medical History:  Diagnosis Date   AAA (abdominal aortic aneurysm) without rupture 08/25/2016   Anxiety    Arthritis    Bipolar 1 disorder (HCC)    CAD (coronary artery disease)    a. 08/08/16 LAD 70%, 60% 1st diag, CTO RCA Med Rx EF 55%   Carotid artery stenosis    Right carotid endarterectomy 07/22/05 with recurrent stenosis 01/2017; known left ICA occlusion   COPD (chronic obstructive pulmonary disease) ([place])    Crack cocaine use    Depression    Headache    Hypertension    Myocardial infarction [facility])    Right Groin Hematoma    a. 07/2016 following diagnostic cath   Stroke [facility])  2017    Family History  Problem Relation Age of Onset   Heart attack Mother    Stroke Mother    Hypertension Mother    Diabetes Mother    Heart disease Mother    Heart attack Father    Hypertension Father    Heart disease Father    Cancer Maternal Aunt    Cancer Maternal Uncle    COPD Paternal Uncle        2 pat uncles with COPD   Cancer Paternal Uncle        1 pat uncle with cancer    SOCIAL HISTORY: Social History   Tobacco Use   Smoking status: Every Day    Packs/day: 0.50    Years: 42.00    Pack years: 21.00    Types: Cigarettes    Start date: 10/05/1974   Smokeless tobacco: Never  Substance Use Topics   Alcohol use: No    Allergies  Allergen Reactions   Penicillins Hives and Swelling    PATIENT  HAS HAD A PCN REACTION WITH IMMEDIATE RASH, FACIAL/TONGUE/THROAT SWELLING, SOB, OR LIGHTHEADEDNESS WITH HYPOTENSION:  #  #  YES  #  # HAS PT DEVELOPED SEVERE RASH INVOLVING MUCUS MEMBRANES or SKIN NECROSIS: #  #  YES  #  # Has patient had a PCN reaction that required hospitalization No Has patient had a PCN reaction occurring within the last 10 years: Non If all of the above answers are "NO", then may proceed with Cephalosporin use.     Current Outpatient Medications  Medication Sig Dispense Refill   albuterol (VENTOLIN HFA) 108 (90 Base) MCG/ACT inhaler INHALE 2 PUFFS BY MOUTH EVERY 6 HOURS AS NEEDED FOR COUGHING, WHEEZING, OR SHORTNESS OF BREATH 20.1 g 0   amLODipine (NORVASC) 10 MG tablet Take 1 tablet (10 mg total) by mouth daily. 90 tablet 1   aspirin 325 MG tablet Take 325 mg by mouth daily.     atorvastatin (LIPITOR) 80 MG tablet TAKE 1 Tablet BY MOUTH ONCE EVERY DAY at 6pm 90 tablet 0   cloNIDine (CATAPRES) 0.2 MG tablet TAKE 1 Tablet  BY MOUTH TWICE DAILY 180 tablet 0   diclofenac (VOLTAREN) 75 MG EC tablet Take 1 tablet (75 mg total) by mouth 2 (two) times daily as needed. (Take with food) 60 tablet 0   furosemide (LASIX) 20 MG tablet TAKE 1 Tablet BY MOUTH ONCE EVERY DAY 90 tablet 0   lisinopril (ZESTRIL) 40 MG tablet TAKE 1 Tablet BY MOUTH ONCE EVERY DAY 90 tablet 0   metoprolol tartrate (LOPRESSOR) 100 MG tablet TAKE 2 Tablets BY MOUTH TWICE DAILY 180 tablet 0   nitroGLYCERIN (NITROSTAT) 0.4 MG SL tablet PLACE 1 TABLET UNDER TONGUE AS NEEDED FOR CHEST PAIN EVERY 5 MINUTES X 3 MAX DOSES.  CALL 911 IF PAIN PERSISTS 25 tablet PRN   No current facility-administered medications for this visit.    REVIEW OF SYSTEMS:  [X]  denotes positive finding, [ ]  denotes negative finding Cardiac  Comments:  Chest pain or chest pressure:    Shortness of breath upon exertion: x   Short of breath when lying flat:    Irregular heart rhythm:        Vascular    Pain in calf, thigh, or hip  brought on by ambulation: x   Pain in feet at night that wakes you up from your sleep:     Blood clot in your veins:    Leg swelling:  x  Pulmonary    Oxygen at home:    Productive cough:     Wheezing:         Neurologic    Sudden weakness in arms or legs:  x   Sudden numbness in arms or legs:     Sudden onset of difficulty speaking or slurred speech:    Temporary loss of vision in one eye:     Problems with dizziness:         Gastrointestinal    Blood in stool:     Vomited blood:         Genitourinary    Burning when urinating:     Blood in urine:        Psychiatric    Major depression:  x       Hematologic    Bleeding problems:    Problems with blood clotting too easily:        Skin    Rashes or ulcers:        Constitutional    Fever or chills:     PHYSICAL EXAM:   Vitals:   10/16/21 0927  BP: 133/72  Pulse: (!) 58  Resp: 20  Temp: 97.9 F (36.6 C)  SpO2: 94%  Weight: 205 lb 14.4 oz (93.4 kg)  Height: 5\' 3"  (1.6 m)   Body mass index is 36.47 kg/m.  GENERAL: The patient is a well-nourished female, in no acute distress. The vital signs are documented above. CARDIAC: There is a regular rate and rhythm.  VASCULAR: She has a right carotid bruit. She has diminished femoral pulses. She has monophasic but fairly brisk dorsalis pedis and posterior tibial signals with the Doppler. PULMONARY: There is good air exchange bilaterally without wheezing or rales. ABDOMEN: Soft and non-tender with normal pitched bowel sounds.  MUSCULOSKELETAL: There are no major deformities or cyanosis. NEUROLOGIC: No focal weakness or paresthesias are detected. SKIN: There are no ulcers or rashes noted. PSYCHIATRIC: The patient has a normal affect.  DATA:    DUPLEX ABDOMINAL AORTA: I have independently interpreted her duplex of the abdominal aorta today.  The maximum diameter of her infrarenal aorta is 4.7 cm which is increased slightly from 4.5 cm 6 months ago.  The common  iliac arteries could not be well visualized.  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  Deitra Mayo [facility] [phone]

## 2021-10-17 ENCOUNTER — Other Ambulatory Visit: Payer: Self-pay | Admitting: Physician Assistant

## 2021-10-20 ENCOUNTER — Other Ambulatory Visit: Payer: Self-pay | Admitting: *Deleted

## 2021-10-20 DIAGNOSIS — I6523 Occlusion and stenosis of bilateral carotid arteries: Secondary | ICD-10-CM

## 2021-10-20 DIAGNOSIS — I739 Peripheral vascular disease, unspecified: Secondary | ICD-10-CM

## 2021-10-20 DIAGNOSIS — I7141 Pararenal abdominal aortic aneurysm, without rupture: Secondary | ICD-10-CM

## 2021-10-31 ENCOUNTER — Other Ambulatory Visit: Payer: Self-pay

## 2021-10-31 ENCOUNTER — Ambulatory Visit (INDEPENDENT_AMBULATORY_CARE_PROVIDER_SITE_OTHER): Payer: Self-pay | Admitting: Cardiology

## 2021-10-31 ENCOUNTER — Encounter: Payer: Self-pay | Admitting: Cardiology

## 2021-10-31 VITALS — BP 140/82 | HR 54 | Ht 63.0 in | Wt 205.6 lb

## 2021-10-31 DIAGNOSIS — E782 Mixed hyperlipidemia: Secondary | ICD-10-CM

## 2021-10-31 DIAGNOSIS — I10 Essential (primary) hypertension: Secondary | ICD-10-CM

## 2021-10-31 DIAGNOSIS — I25119 Atherosclerotic heart disease of native coronary artery with unspecified angina pectoris: Secondary | ICD-10-CM

## 2021-10-31 NOTE — Progress Notes (Signed)
Cardiology Office Note  Date: 10/31/2021   ID: [name], DOB 06/24/1961, MRN [id number]  PCP:  Jacquelin HawkingMcElroy, Shannon, PA-C  Cardiologist:  Nona DellSamuel Oree Hislop, MD Electrophysiologist:  None   Chief Complaint  Patient presents with   Cardiac follow-up    History of Present Illness: [name] LoftyDebra G Missey is a 61 y.o. female last seen in August 2022 by Mr. Vincenza HewsQuinn NP.  She is here for a routine visit.  She does not report any angina symptoms or nitroglycerin use in the interim, has a fresh bottle available.  She continues to [place] free clinic in [place].  I reviewed her medications which are noted below.  She reports compliance with Lipitor, although her last LDL had gone up to 143 in December 2022.  She does state that her diet has been less healthy (more fast food).  She continues to follow with Dr. Edilia Boickson with known AAA, 4.7 cm by most recent assessment.  Also has PAD and carotid artery disease that are being followed as well.  She has claudication and arthritic symptoms.   Past Medical History:  Diagnosis Date   AAA (abdominal aortic aneurysm) without rupture 08/25/2016   Anxiety    Arthritis    Bipolar 1 disorder (HCC)    CAD (coronary artery disease)    a. 08/08/16 LAD 70%, 60% 1st diag, CTO RCA Med Rx EF 55%   Carotid artery stenosis    Right carotid endarterectomy 07/22/05 with recurrent stenosis 01/2017; known left ICA occlusion   COPD (chronic obstructive pulmonary disease) (HCC)    Crack cocaine use    Depression    Headache    Hypertension    Myocardial infarction [facility](HCC)    Right Groin Hematoma    a. 07/2016 following diagnostic cath   Stroke [facility]) 2017    Past Surgical History:  Procedure Laterality Date   CARDIAC CATHETERIZATION N/A 08/07/2016   Procedure: Left Heart Cath and Coronary Angiography;  Surgeon: Lennette Biharihomas A Kelly, MD;  Location: [facility] INVASIVE CV LAB;  Service: Cardiovascular;  Laterality: N/A;   CYST REMOVAL TRUNK Left    beneath L breast    ENDARTERECTOMY Right 02/08/2018   Procedure: ENDARTERECTOMY CAROTID REDO;  Surgeon: Chuck Hintickson, Christopher S, MD;  [facility];  Service: Vascular;  Laterality: Right;   TUBAL LIGATION     VASCULAR SURGERY Right    right carotid endarterectomy 07/22/05     Current Outpatient Medications  Medication Sig Dispense Refill   albuterol (VENTOLIN HFA) 108 (90 Base) MCG/ACT inhaler INHALE 2 PUFFS BY MOUTH EVERY 6 HOURS AS NEEDED FOR COUGHING, WHEEZING, OR SHORTNESS OF BREATH 20.1 g 0   amLODipine (NORVASC) 10 MG tablet Take 1 tablet (10 mg total) by mouth daily. 90 tablet 1   aspirin 325 MG tablet Take 325 mg by mouth daily.     atorvastatin (LIPITOR) 80 MG tablet TAKE 1 Tablet BY MOUTH ONCE EVERY DAY at 6pm 90 tablet 0   cloNIDine (CATAPRES) 0.2 MG tablet TAKE 1 Tablet  BY MOUTH TWICE DAILY 180 tablet 0   diclofenac (VOLTAREN) 75 MG EC tablet Take 1 tablet (75 mg total) by mouth 2 (two) times daily as needed. (Take with food) 60 tablet 0   furosemide (LASIX) 20 MG tablet TAKE 1 Tablet BY MOUTH ONCE EVERY DAY 90 tablet 0   lisinopril (ZESTRIL) 40 MG tablet TAKE 1 Tablet BY MOUTH ONCE EVERY DAY 90 tablet 0   metoprolol tartrate (LOPRESSOR) 100 MG tablet TAKE 2 Tablets  BY MOUTH TWICE DAILY 180 tablet 0   nitroGLYCERIN (NITROSTAT) 0.4 MG SL tablet PLACE 1 TABLET UNDER TONGUE AS NEEDED FOR CHEST PAIN EVERY 5 MINUTES X 3 MAX DOSES.  CALL 911 IF PAIN PERSISTS 25 tablet PRN   No current facility-administered medications for this visit.   Allergies:  Penicillins   ROS: No palpitations or syncope.  Physical Exam: VS:  BP 140/82    Pulse (!) 54    Ht 5\' 3"  (1.6 m)    Wt 205 lb 9.6 oz (93.3 kg)    LMP 07/18/2011    SpO2 97%    BMI 36.42 kg/m , BMI Body mass index is 36.42 kg/m.  Wt Readings from Last 3 Encounters:  10/31/21 205 lb 9.6 oz (93.3 kg)  10/16/21 205 lb 14.4 oz (93.4 kg)  06/03/21 201 lb (91.2 kg)    General: Patient appears comfortable at rest. HEENT: Conjunctiva and lids normal,  wearing a mask. Neck: Supple, no elevated JVP, soft bilateral carotid bruits. Lungs: Decreased breath sounds without wheezing. Cardiac: Regular rate and rhythm, no S3, 2/6 systolic murmur. Extremities: No pitting edema, distal pulses 1+.  ECG:  An ECG dated 05/06/2021 was personally reviewed today and demonstrated:  Sinus bradycardia with old inferior infarct pattern.  Recent Labwork: 09/02/2021: ALT 19; AST 20; BUN 17; Creatinine, Ser 0.66; Potassium 4.1; Sodium 139     Component Value Date/Time   CHOL 207 (H) 09/02/2021 1054   TRIG 123 09/02/2021 1054   HDL 39 (L) 09/02/2021 1054   CHOLHDL 5.3 09/02/2021 1054   VLDL 25 09/02/2021 1054   LDLCALC 143 (H) 09/02/2021 1054    Other Studies Reviewed Today:  Echocardiogram 11/02/2019:  1. Left ventricular ejection fraction, by visual estimation, is 60 to  65%. The left ventricle has normal function. There is mildly increased  left ventricular hypertrophy.   2. Left ventricular diastolic parameters are consistent with Grade I  diastolic dysfunction (impaired relaxation).   3. The left ventricle has no regional wall motion abnormalities.   4. Global right ventricle has normal systolic function.The right  ventricular size is normal. No increase in right ventricular wall  thickness.   5. Left atrial size was mildly dilated.   6. Right atrial size was normal.   7. Mild mitral annular calcification.   8. The mitral valve is grossly normal. Mild mitral valve regurgitation.   9. The tricuspid valve is grossly normal. Tricuspid valve regurgitation  is trivial.  10. The aortic valve was not well visualized. Aortic valve regurgitation  is not visualized.  11. The pulmonic valve was grossly normal. Pulmonic valve regurgitation is  trivial.  12. Normal pulmonary artery systolic pressure.  13. The tricuspid regurgitant velocity is 2.27 m/s, and with an assumed  right atrial pressure of 3 mmHg, the estimated right ventricular systolic  pressure  is normal at 23.6 mmHg.  14. The inferior vena cava is normal in size with greater than 50%  respiratory variability, suggesting right atrial pressure of 3 mmHg.   Assessment and Plan:  1.  Multivessel CAD with moderate LAD into diagonal stenosis as well as chronic occlusion of the RCA, medically managed and without significant angina at this point.  Continue aspirin, Norvasc, lisinopril, Lipitor, and Lopressor.  She has as needed nitroglycerin available.  2.  Mixed hyperlipidemia on high-dose Lipitor.  LDL has increased, we discussed her diet and we will continue with current treatment for now.  Anticipate follow-up lipid panel around the time of her  next visit if not done in the interim by PCP.  3.  Essential hypertension, continue current regimen including lisinopril, Lopressor, clonidine, and Norvasc.  Medication Adjustments/Labs and Tests Ordered: Current medicines are reviewed at length with the patient today.  Concerns regarding medicines are outlined above.   Tests Ordered: No orders of the defined types were placed in this encounter.   Medication Changes: No orders of the defined types were placed in this encounter.   Disposition:  Follow up  6 months.  Signed, Jonelle Sidle, MD, [facility] 10/31/2021 2:15 PM    [facility] [address] [place], [place], [place] [address] Phone: [phone]; Fax: [phone]

## 2021-10-31 NOTE — Patient Instructions (Signed)
Follow-Up: ?Follow up with Dr. McDowell in 6 months.  ? ?Any Other Special Instructions Will Be Listed Below (If Applicable). ? ? ? ? ?If you need a refill on your cardiac medications before your next appointment, please call your pharmacy. ? ?

## 2021-11-10 ENCOUNTER — Ambulatory Visit: Payer: Self-pay | Admitting: Cardiology

## 2021-11-27 ENCOUNTER — Other Ambulatory Visit: Payer: Self-pay | Admitting: Physician Assistant

## 2021-12-01 ENCOUNTER — Other Ambulatory Visit: Payer: Self-pay | Admitting: Physician Assistant

## 2021-12-01 DIAGNOSIS — I2510 Atherosclerotic heart disease of native coronary artery without angina pectoris: Secondary | ICD-10-CM

## 2021-12-01 DIAGNOSIS — E785 Hyperlipidemia, unspecified: Secondary | ICD-10-CM

## 2021-12-01 DIAGNOSIS — I10 Essential (primary) hypertension: Secondary | ICD-10-CM

## 2021-12-16 ENCOUNTER — Ambulatory Visit: Payer: Self-pay | Admitting: Physician Assistant

## 2021-12-22 ENCOUNTER — Ambulatory Visit: Payer: Self-pay | Admitting: Physician Assistant

## 2021-12-24 ENCOUNTER — Encounter: Payer: Self-pay | Admitting: Physician Assistant

## 2021-12-24 ENCOUNTER — Other Ambulatory Visit (HOSPITAL_COMMUNITY)
Admission: RE | Admit: 2021-12-24 | Discharge: 2021-12-24 | Disposition: A | Discharge status: Home / Self Care | Payer: Self-pay | Source: Ambulatory Visit | Attending: Physician Assistant | Admitting: Physician Assistant

## 2021-12-24 ENCOUNTER — Ambulatory Visit: Payer: Self-pay | Admitting: Physician Assistant

## 2021-12-24 VITALS — BP 109/68 | HR 79 | Temp 98.0°F | Wt 206.0 lb

## 2021-12-24 DIAGNOSIS — Z5320 Procedure and treatment not carried out because of patient's decision for unspecified reasons: Secondary | ICD-10-CM

## 2021-12-24 DIAGNOSIS — I2510 Atherosclerotic heart disease of native coronary artery without angina pectoris: Secondary | ICD-10-CM | POA: Insufficient documentation

## 2021-12-24 DIAGNOSIS — I779 Disorder of arteries and arterioles, unspecified: Secondary | ICD-10-CM

## 2021-12-24 DIAGNOSIS — F17200 Nicotine dependence, unspecified, uncomplicated: Secondary | ICD-10-CM

## 2021-12-24 DIAGNOSIS — I714 Abdominal aortic aneurysm, without rupture, unspecified: Secondary | ICD-10-CM

## 2021-12-24 DIAGNOSIS — J449 Chronic obstructive pulmonary disease, unspecified: Secondary | ICD-10-CM

## 2021-12-24 DIAGNOSIS — I739 Peripheral vascular disease, unspecified: Secondary | ICD-10-CM | POA: Insufficient documentation

## 2021-12-24 DIAGNOSIS — I10 Essential (primary) hypertension: Secondary | ICD-10-CM

## 2021-12-24 LAB — COMPREHENSIVE METABOLIC PANEL
ALT: 16 U/L (ref 0–44)
AST: 19 U/L (ref 15–41)
Albumin: 3.5 g/dL (ref 3.5–5.0)
Alkaline Phosphatase: 92 U/L (ref 38–126)
Anion gap: 3 — ABNORMAL LOW (ref 5–15)
BUN: 13 mg/dL (ref 8–23)
CO2: 25 mmol/L (ref 22–32)
Calcium: 8.8 mg/dL — ABNORMAL LOW (ref 8.9–10.3)
Chloride: 108 mmol/L (ref 98–111)
Creatinine, Ser: 0.73 mg/dL (ref 0.44–1.00)
GFR, Estimated: >60 mL/min (ref >60)
Glucose, Bld: 114 mg/dL — ABNORMAL HIGH (ref 70–99)
Potassium: 4.5 mmol/L (ref 3.5–5.1)
Sodium: 136 mmol/L (ref 135–145)
Total Bilirubin: 0.6 mg/dL (ref 0.3–1.2)
Total Protein: 6.8 g/dL (ref 6.5–8.1)

## 2021-12-24 LAB — LIPID PANEL
Cholesterol: 218 mg/dL — ABNORMAL HIGH (ref 0–200)
HDL: 34 mg/dL — ABNORMAL LOW (ref >40)
LDL Cholesterol: 146 mg/dL — ABNORMAL HIGH (ref 0–99)
Total CHOL/HDL Ratio: 6.4 RATIO
Triglycerides: 192 mg/dL — ABNORMAL HIGH (ref <150)
VLDL: 38 mg/dL (ref 0–40)

## 2021-12-24 NOTE — Patient Instructions (Signed)
Alirocumab injection ?What is this medication? ?ALIROCUMAB (al i ROC ue mab) is known as a PCSK9 inhibitor. It is used to lower the level of cholesterol in the blood. It may be used alone or in combination with other cholesterol-lowering drugs. This drug may also be used to reduce the risk of heart attack, stroke, and certain types of chest pain (unstable angina) that may need hospitalization. ?This medicine may be used for other purposes; ask your health care provider or pharmacist if you have questions. ?COMMON BRAND NAME(S): Praluent ?What should I tell my care team before I take this medication? ?They need to know if you have any of these conditions: ?any unusual or allergic reaction to alirocumab, other medicines, foods, dyes, or preservatives ?pregnant or trying to get pregnant ?breast-feeding ?How should I use this medication? ?This medicine is for injection under the skin. You will be taught how to prepare and give this medicine. Use exactly as directed. Take your medicine at regular intervals. Do not take your medicine more often than directed. ?It is important that you put your used needles and syringes in a special sharps container. Do not put them in a trash can. If you do not have a sharps container, call your pharmacist or healthcare provider to get one. ?Talk to your pediatrician regarding the use of this medicine in children. Special care may be needed. ?Overdosage: If you think you have taken too much of this medicine contact a poison control center or emergency room at once. ?NOTE: This medicine is only for you. Do not share this medicine with others. ?What if I miss a dose? ?If you are on an every 2 week schedule and miss a dose, take it as soon as you can. If your next dose is to be taken in less than 7 days, then do not take the missed dose. Take the next dose at your regular time. Do not take double or extra doses. If you are on a monthly schedule and miss a dose, take it as soon as you can. If  the dose given is within 7 days of the missed dose, continue with your regular monthly schedule. If the missed dose is administered after 7 days of the original date, administer the dose and start a new monthly schedule based on this date. ?What may interact with this medication? ?Interactions are not expected. ?This list may not describe all possible interactions. Give your health care provider a list of all the medicines, herbs, non-prescription drugs, or dietary supplements you use. Also tell them if you smoke, drink alcohol, or use illegal drugs. Some items may interact with your medicine. ?What should I watch for while using this medication? ?You may need blood work done while you are taking this medicine. ?What side effects may I notice from receiving this medication? ?Side effects that you should report to your doctor or health care professional as soon as possible: ?allergic reactions like skin rash, itching or hives, swelling of the face, lips, or tongue ?signs and symptoms of infection like fever or chills; cough; sore throat; pain or trouble passing urine ?signs and symptoms of liver injury like dark yellow or brown urine; general ill feeling or flu-like symptoms; light-colored stools; loss of appetite; nausea; right upper belly pain; unusually weak or tired; yellowing of the eyes or skin ?Side effects that usually do not require medical attention (report to your doctor or health care professional if they continue or are bothersome): ?diarrhea ?muscle cramps ?muscle pain ?pain, redness,  or irritation at site where injected ?This list may not describe all possible side effects. Call your doctor for medical advice about side effects. You may report side effects to FDA at 1-800-FDA-1088. ?Where should I keep my medication? ?Keep out of the reach of children. ?You will be instructed on how to store this medicine. Throw away any unused medicine after the expiration date on the label. ?NOTE: This sheet is a  summary. It may not cover all possible information. If you have questions about this medicine, talk to your doctor, pharmacist, or health care provider. ?? 2022 Elsevier/Gold Standard (2018-01-28 00:00:00) ? ?

## 2021-12-24 NOTE — Progress Notes (Signed)
? ?BP 109/68   Pulse 79   Temp 98 ?F (36.7 ?C)   Wt 206 lb (93.4 kg)   LMP 07/18/2011   SpO2 91%   BMI 36.49 kg/m?   ? ?Subjective:  ? ? Patient ID: [name], female    DOB: [birth year], 61 y.o.   MRN: [id number] ? ?HPI: ?[name] is a 61 y.o. female presenting on 12/24/2021 for Hyperlipidemia and Hypertension ? ? ?HPI ? ?Chief Complaint  ?Patient presents with  ? Hyperlipidemia  ? Hypertension  ? ? ? ?Pt says her Legs are hurting at times.   ? ?She cotinues to smoke. ? ?She Saw cardiology last month.  no changes were made.  She has follow up there 6 months ? ?She saw Vascular surgery in January-    ? ?She misses her atorvastatin about 1/2 the time.  She thinks it's because she takes it in the evevning ? ?She Uses her MDI maybe twice/week ? ? ? ?Relevant past medical, surgical, family and social history reviewed and updated as indicated. Interim medical history since our last visit reviewed. ?Allergies and medications reviewed and updated. ? ? ?Current Outpatient Medications:  ?  albuterol (VENTOLIN HFA) 108 (90 Base) MCG/ACT inhaler, INHALE 2 PUFFS BY MOUTH EVERY 6 HOURS AS NEEDED FOR COUGHING, WHEEZING, OR SHORTNESS OF BREATH, Disp: 20.1 g, Rfl: 1 ?  amLODipine (NORVASC) 10 MG tablet, TAKE 1 Tablet BY MOUTH ONCE EVERY DAY, Disp: 90 tablet, Rfl: 1 ?  aspirin 325 MG tablet, Take 325 mg by mouth daily., Disp: , Rfl:  ?  atorvastatin (LIPITOR) 80 MG tablet, TAKE 1 Tablet BY MOUTH ONCE EVERY DAY AT 6PM, Disp: 90 tablet, Rfl: 1 ?  cloNIDine (CATAPRES) 0.2 MG tablet, TAKE 1 Tablet  BY MOUTH TWICE DAILY, Disp: 180 tablet, Rfl: 0 ?  furosemide (LASIX) 20 MG tablet, TAKE 1 Tablet BY MOUTH ONCE EVERY DAY, Disp: 90 tablet, Rfl: 1 ?  lisinopril (ZESTRIL) 40 MG tablet, TAKE 1 Tablet BY MOUTH ONCE EVERY DAY, Disp: 90 tablet, Rfl: 1 ?  metoprolol tartrate (LOPRESSOR) 100 MG tablet, TAKE 2 Tablets BY MOUTH TWICE DAILY, Disp: 180 tablet, Rfl: 1 ?  diclofenac (VOLTAREN) 75 MG EC tablet, Take 1 tablet (75 mg total)  by mouth 2 (two) times daily as needed. (Take with food) (Patient not taking: Reported on 12/24/2021), Disp: 60 tablet, Rfl: 0 ?  nitroGLYCERIN (NITROSTAT) 0.4 MG SL tablet, PLACE 1 TABLET UNDER TONGUE AS NEEDED FOR CHEST PAIN EVERY 5 MINUTES X 3 MAX DOSES.  CALL 911 IF PAIN PERSISTS (Patient not taking: Reported on 12/24/2021), Disp: 25 tablet, Rfl: PRN ? ? ? ?Review of Systems ? ?Per HPI unless specifically indicated above ? ?   ?Objective:  ?  ?BP 109/68   Pulse 79   Temp 98 ?F (36.7 ?C)   Wt 206 lb (93.4 kg)   LMP 07/18/2011   SpO2 91%   BMI 36.49 kg/m?   ?Wt Readings from Last 3 Encounters:  ?12/24/21 206 lb (93.4 kg)  ?10/31/21 205 lb 9.6 oz (93.3 kg)  ?10/16/21 205 lb 14.4 oz (93.4 kg)  ?  ?Physical Exam ?Vitals reviewed.  ?Constitutional:   ?   General: She is not in acute distress. ?   Appearance: She is well-developed. She is not toxic-appearing.  ?HENT:  ?   Head: Normocephalic and atraumatic.  ?Cardiovascular:  ?   Rate and Rhythm: Normal rate and regular rhythm.  ?Pulmonary:  ?   Effort: Pulmonary effort  is normal.  ?   Breath sounds: Normal breath sounds.  ?Abdominal:  ?   General: Bowel sounds are normal.  ?   Palpations: Abdomen is soft. There is no mass.  ?   Tenderness: There is no abdominal tenderness.  ?Musculoskeletal:  ?   Cervical back: Neck supple.  ?   Comments: Trace BLE edema  ?Lymphadenopathy:  ?   Cervical: No cervical adenopathy.  ?Skin: ?   General: Skin is warm and dry.  ?Neurological:  ?   Mental Status: She is alert and oriented to person, place, and time.  ?Psychiatric:     ?   Behavior: Behavior normal.  ? ? ?Results for orders placed or performed during the hospital encounter of 12/24/21  ?Comprehensive metabolic panel  ?Result Value Ref Range  ? Sodium 136 135 - 145 mmol/L  ? Potassium 4.5 3.5 - 5.1 mmol/L  ? Chloride 108 98 - 111 mmol/L  ? CO2 25 22 - 32 mmol/L  ? Glucose, Bld 114 (H) 70 - 99 mg/dL  ? BUN 13 8 - 23 mg/dL  ? Creatinine, Ser 0.73 0.44 - 1.00 mg/dL  ? Calcium  8.8 (L) 8.9 - 10.3 mg/dL  ? Total Protein 6.8 6.5 - 8.1 g/dL  ? Albumin 3.5 3.5 - 5.0 g/dL  ? AST 19 15 - 41 U/L  ? ALT 16 0 - 44 U/L  ? Alkaline Phosphatase 92 38 - 126 U/L  ? Total Bilirubin 0.6 0.3 - 1.2 mg/dL  ? GFR, Estimated >60 >60 mL/min  ? Anion gap 3 (L) 5 - 15  ?Lipid panel  ?Result Value Ref Range  ? Cholesterol 218 (H) 0 - 200 mg/dL  ? Triglycerides 192 (H) <150 mg/dL  ? HDL 34 (L) >40 mg/dL  ? Total CHOL/HDL Ratio 6.4 RATIO  ? VLDL 38 0 - 40 mg/dL  ? LDL Cholesterol 146 (H) 0 - 99 mg/dL  ? ?   ?Assessment & Plan:  ? ? ? ?Encounter Diagnoses  ?Name Primary?  ? Coronary artery disease involving native coronary artery of native heart without angina pectoris Yes  ? Bilateral carotid artery disease, unspecified type ([place])   ? PVD (peripheral vascular disease) ([place])   ? Abdominal aortic aneurysm (AAA) without rupture, unspecified part   ? Essential hypertension   ? Tobacco use disorder   ? Chronic obstructive pulmonary disease, unspecified COPD type ([place])   ? Screening mammography declined   ? Cervical cancer screening declined   ? ? ? ? ? ?-Reviewed labs with pt ?-Discussed pcsk9 inhibitor to replace the atorvastatin and she would like to proceed.  Completed PAP for praluent.  Pt was given reading information on the medication.  Pt and provider watched video together on how to self-administer the medication.  All questions posed by pt were answered ?-pt counseled on Stopping smoking.  She is given handouts including for a class to help with cessation ?-Pt was requested to return FIT test given to her may 2022 for colon cancer screening ?-Screening Mammogram was declined ?-pt Declined PAP ?-pt to follow up 3 months.  She is to contact office sooner prn ? ? ?

## 2022-01-05 ENCOUNTER — Other Ambulatory Visit: Payer: Self-pay | Admitting: Physician Assistant

## 2022-01-05 MED ORDER — ALIROCUMAB 75 MG/ML ~~LOC~~ SOAJ
75.0000 mg | SUBCUTANEOUS | 3 refills | Status: DC
Start: 2022-01-05 — End: 2022-12-29

## 2022-01-29 ENCOUNTER — Telehealth: Payer: Self-pay | Admitting: Physician Assistant

## 2022-01-29 NOTE — Telephone Encounter (Signed)
Pt was called and reminded to update her enrollment.  She was given contact information for [facility]. ?

## 2022-02-05 ENCOUNTER — Other Ambulatory Visit: Payer: Self-pay | Admitting: Physician Assistant

## 2022-02-09 ENCOUNTER — Encounter (HOSPITAL_COMMUNITY): Payer: Self-pay

## 2022-02-09 ENCOUNTER — Other Ambulatory Visit: Payer: Self-pay

## 2022-02-09 ENCOUNTER — Emergency Department (HOSPITAL_COMMUNITY)
Admission: EM | Admit: 2022-02-09 | Discharge: 2022-02-09 | Discharge status: Against Medical Advice | Payer: Self-pay | Attending: Emergency Medicine | Admitting: Emergency Medicine

## 2022-02-09 DIAGNOSIS — R202 Paresthesia of skin: Secondary | ICD-10-CM | POA: Insufficient documentation

## 2022-02-09 DIAGNOSIS — Z5321 Procedure and treatment not carried out due to patient leaving prior to being seen by health care provider: Secondary | ICD-10-CM | POA: Insufficient documentation

## 2022-02-09 DIAGNOSIS — M79604 Pain in right leg: Secondary | ICD-10-CM | POA: Insufficient documentation

## 2022-02-09 DIAGNOSIS — R531 Weakness: Secondary | ICD-10-CM | POA: Insufficient documentation

## 2022-02-09 NOTE — ED Triage Notes (Signed)
Pov from home. Cc of  left arm tingling that went away and went to her right arm and it is still tinging.  ?Says that her left leg has weakness from  an old stroke but now her leg hurts up to her back when she stands up. Right leg aching.  ?Says that she has a lot gong on lately with herring her license that all this started initially [place]. Also says that she has been crying a lot.  ?

## 2022-02-09 NOTE — ED Notes (Signed)
Pt states that she did not want to wait for provider.  ?

## 2022-02-11 ENCOUNTER — Emergency Department (HOSPITAL_COMMUNITY): Payer: Medicaid Other

## 2022-02-11 ENCOUNTER — Encounter (HOSPITAL_COMMUNITY): Payer: Self-pay | Admitting: Emergency Medicine

## 2022-02-11 ENCOUNTER — Emergency Department (HOSPITAL_COMMUNITY)
Admission: EM | Admit: 2022-02-11 | Discharge: 2022-02-11 | Disposition: A | Discharge status: Home / Self Care | Payer: Medicaid Other | Attending: Emergency Medicine | Admitting: Emergency Medicine

## 2022-02-11 ENCOUNTER — Emergency Department (HOSPITAL_BASED_OUTPATIENT_CLINIC_OR_DEPARTMENT_OTHER): Payer: Medicaid Other

## 2022-02-11 ENCOUNTER — Other Ambulatory Visit: Payer: Self-pay

## 2022-02-11 DIAGNOSIS — I639 Cerebral infarction, unspecified: Secondary | ICD-10-CM | POA: Diagnosis not present

## 2022-02-11 DIAGNOSIS — I2510 Atherosclerotic heart disease of native coronary artery without angina pectoris: Secondary | ICD-10-CM | POA: Diagnosis not present

## 2022-02-11 DIAGNOSIS — G453 Amaurosis fugax: Secondary | ICD-10-CM

## 2022-02-11 DIAGNOSIS — F17210 Nicotine dependence, cigarettes, uncomplicated: Secondary | ICD-10-CM | POA: Diagnosis not present

## 2022-02-11 DIAGNOSIS — Z79899 Other long term (current) drug therapy: Secondary | ICD-10-CM | POA: Insufficient documentation

## 2022-02-11 DIAGNOSIS — J449 Chronic obstructive pulmonary disease, unspecified: Secondary | ICD-10-CM | POA: Insufficient documentation

## 2022-02-11 DIAGNOSIS — M25561 Pain in right knee: Secondary | ICD-10-CM | POA: Diagnosis not present

## 2022-02-11 DIAGNOSIS — I10 Essential (primary) hypertension: Secondary | ICD-10-CM | POA: Diagnosis not present

## 2022-02-11 DIAGNOSIS — Z20822 Contact with and (suspected) exposure to covid-19: Secondary | ICD-10-CM | POA: Diagnosis not present

## 2022-02-11 DIAGNOSIS — G459 Transient cerebral ischemic attack, unspecified: Secondary | ICD-10-CM

## 2022-02-11 DIAGNOSIS — M545 Low back pain, unspecified: Secondary | ICD-10-CM | POA: Insufficient documentation

## 2022-02-11 DIAGNOSIS — H547 Unspecified visual loss: Secondary | ICD-10-CM

## 2022-02-11 DIAGNOSIS — G8929 Other chronic pain: Secondary | ICD-10-CM

## 2022-02-11 DIAGNOSIS — R479 Unspecified speech disturbances: Secondary | ICD-10-CM | POA: Insufficient documentation

## 2022-02-11 DIAGNOSIS — I2589 Other forms of chronic ischemic heart disease: Secondary | ICD-10-CM

## 2022-02-11 DIAGNOSIS — Z955 Presence of coronary angioplasty implant and graft: Secondary | ICD-10-CM | POA: Diagnosis not present

## 2022-02-11 LAB — APTT: aPTT: 31 seconds (ref 24–36)

## 2022-02-11 LAB — CBC
HCT: 40.2 % (ref 36.0–46.0)
Hemoglobin: 12.8 g/dL (ref 12.0–15.0)
MCH: 30.2 pg (ref 26.0–34.0)
MCHC: 31.8 g/dL (ref 30.0–36.0)
MCV: 94.8 fL (ref 80.0–100.0)
Platelets: 291 10*3/uL (ref 150–400)
RBC: 4.24 MIL/uL (ref 3.87–5.11)
RDW: 12.7 % (ref 11.5–15.5)
WBC: 7.1 10*3/uL (ref 4.0–10.5)
nRBC: 0 % (ref 0.0–0.2)

## 2022-02-11 LAB — RESP PANEL BY RT-PCR (FLU A&B, COVID) ARPGX2
Influenza A by PCR: NEGATIVE
Influenza B by PCR: NEGATIVE
SARS Coronavirus 2 by RT PCR: NEGATIVE

## 2022-02-11 LAB — I-STAT CHEM 8, ED
BUN: 14 mg/dL (ref 8–23)
Calcium, Ion: 1.09 mmol/L — ABNORMAL LOW (ref 1.15–1.40)
Chloride: 102 mmol/L (ref 98–111)
Creatinine, Ser: 0.9 mg/dL (ref 0.44–1.00)
Glucose, Bld: 101 mg/dL — ABNORMAL HIGH (ref 70–99)
HCT: 38 % (ref 36.0–46.0)
Hemoglobin: 12.9 g/dL (ref 12.0–15.0)
Potassium: 3.8 mmol/L (ref 3.5–5.1)
Sodium: 140 mmol/L (ref 135–145)
TCO2: 27 mmol/L (ref 22–32)

## 2022-02-11 LAB — RAPID URINE DRUG SCREEN, HOSP PERFORMED
Amphetamines: NOT DETECTED
Barbiturates: NOT DETECTED
Benzodiazepines: POSITIVE — AB
Cocaine: NOT DETECTED
Opiates: POSITIVE — AB
Tetrahydrocannabinol: POSITIVE — AB

## 2022-02-11 LAB — COMPREHENSIVE METABOLIC PANEL
ALT: 14 U/L (ref 0–44)
AST: 20 U/L (ref 15–41)
Albumin: 3.5 g/dL (ref 3.5–5.0)
Alkaline Phosphatase: 103 U/L (ref 38–126)
Anion gap: 5 (ref 5–15)
BUN: 15 mg/dL (ref 8–23)
CO2: 29 mmol/L (ref 22–32)
Calcium: 8.8 mg/dL — ABNORMAL LOW (ref 8.9–10.3)
Chloride: 106 mmol/L (ref 98–111)
Creatinine, Ser: 0.81 mg/dL (ref 0.44–1.00)
GFR, Estimated: >60 mL/min (ref >60)
Glucose, Bld: 103 mg/dL — ABNORMAL HIGH (ref 70–99)
Potassium: 3.8 mmol/L (ref 3.5–5.1)
Sodium: 140 mmol/L (ref 135–145)
Total Bilirubin: 0.4 mg/dL (ref 0.3–1.2)
Total Protein: 6.7 g/dL (ref 6.5–8.1)

## 2022-02-11 LAB — DIFFERENTIAL
Abs Immature Granulocytes: 0.01 10*3/uL (ref 0.00–0.07)
Basophils Absolute: 0 10*3/uL (ref 0.0–0.1)
Basophils Relative: 0 %
Eosinophils Absolute: 0.2 10*3/uL (ref 0.0–0.5)
Eosinophils Relative: 3 %
Immature Granulocytes: 0 %
Lymphocytes Relative: 45 %
Lymphs Abs: 3.1 10*3/uL (ref 0.7–4.0)
Monocytes Absolute: 0.6 10*3/uL (ref 0.1–1.0)
Monocytes Relative: 9 %
Neutro Abs: 3.1 10*3/uL (ref 1.7–7.7)
Neutrophils Relative %: 43 %

## 2022-02-11 LAB — URINALYSIS, ROUTINE W REFLEX MICROSCOPIC
Bilirubin Urine: NEGATIVE
Glucose, UA: NEGATIVE mg/dL
Hgb urine dipstick: NEGATIVE
Ketones, ur: NEGATIVE mg/dL
Leukocytes,Ua: NEGATIVE
Nitrite: NEGATIVE
Protein, ur: NEGATIVE mg/dL
Specific Gravity, Urine: 1.018 (ref 1.005–1.030)
pH: 7 (ref 5.0–8.0)

## 2022-02-11 LAB — ECHOCARDIOGRAM COMPLETE BUBBLE STUDY
AR max vel: 2.42 cm2
AV Area VTI: 2.29 cm2
AV Area mean vel: 2.4 cm2
AV Mean grad: 5 mmHg
AV Peak grad: 11.4 mmHg
Ao pk vel: 1.69 m/s
Area-P 1/2: 2.36 cm2
MV VTI: 2.43 cm2
S' Lateral: 2.7 cm

## 2022-02-11 LAB — ETHANOL: Alcohol, Ethyl (B): <10 mg/dL (ref <10)

## 2022-02-11 LAB — PROTIME-INR
INR: 1 (ref 0.8–1.2)
Prothrombin Time: 12.6 seconds (ref 11.4–15.2)

## 2022-02-11 MED ORDER — ASPIRIN 325 MG PO TABS
325.0000 mg | ORAL_TABLET | Freq: Every day | ORAL | Status: DC
  Administered 2022-02-11: 325 mg via ORAL
  Filled 2022-02-11: qty 1

## 2022-02-11 MED ORDER — AMLODIPINE BESYLATE 5 MG PO TABS
10.0000 mg | ORAL_TABLET | Freq: Every day | ORAL | Status: DC
Start: 2022-02-11 — End: 2022-02-11
  Administered 2022-02-11: 10 mg via ORAL
  Filled 2022-02-11: qty 2

## 2022-02-11 MED ORDER — FENTANYL CITRATE PF 50 MCG/ML IJ SOSY
50.0000 ug | PREFILLED_SYRINGE | Freq: Once | INTRAMUSCULAR | Status: AC
  Administered 2022-02-11: 50 ug via INTRAVENOUS
  Filled 2022-02-11: qty 1

## 2022-02-11 MED ORDER — CLONIDINE HCL 0.2 MG PO TABS
0.2000 mg | ORAL_TABLET | Freq: Two times a day (BID) | ORAL | Status: DC
  Administered 2022-02-11: 0.2 mg via ORAL
  Filled 2022-02-11: qty 1

## 2022-02-11 MED ORDER — CLOPIDOGREL BISULFATE 75 MG PO TABS
75.0000 mg | ORAL_TABLET | Freq: Every day | ORAL | Status: DC
  Administered 2022-02-11: 75 mg via ORAL
  Filled 2022-02-11: qty 1

## 2022-02-11 MED ORDER — GABAPENTIN 300 MG PO CAPS
300.0000 mg | ORAL_CAPSULE | Freq: Three times a day (TID) | ORAL | 0 refills | Status: DC | PRN
Start: 2022-02-11 — End: 2023-04-28

## 2022-02-11 MED ORDER — CLOPIDOGREL BISULFATE 75 MG PO TABS
75.0000 mg | ORAL_TABLET | Freq: Every day | ORAL | 0 refills | Status: AC
Start: 2022-02-11 — End: 2022-05-12

## 2022-02-11 MED ORDER — ASPIRIN 325 MG PO TABS
325.0000 mg | ORAL_TABLET | Freq: Every day | ORAL | 0 refills | Status: AC
Start: 2022-02-11 — End: 2022-05-12

## 2022-02-11 MED ORDER — ATORVASTATIN CALCIUM 40 MG PO TABS: 80.0000 mg | ORAL_TABLET | Freq: Every day | ORAL | Status: DC

## 2022-02-11 MED ORDER — LISINOPRIL 10 MG PO TABS
40.0000 mg | ORAL_TABLET | Freq: Every day | ORAL | Status: DC
  Administered 2022-02-11: 40 mg via ORAL
  Filled 2022-02-11: qty 4

## 2022-02-11 MED ORDER — FUROSEMIDE 40 MG PO TABS
20.0000 mg | ORAL_TABLET | Freq: Every day | ORAL | Status: DC
Start: 2022-02-11 — End: 2022-02-11
  Administered 2022-02-11: 20 mg via ORAL
  Filled 2022-02-11: qty 1

## 2022-02-11 MED ORDER — GABAPENTIN 300 MG PO CAPS
600.0000 mg | ORAL_CAPSULE | Freq: Once | ORAL | Status: AC
  Administered 2022-02-11: 600 mg via ORAL
  Filled 2022-02-11: qty 2

## 2022-02-11 MED ORDER — METOPROLOL TARTRATE 50 MG PO TABS
200.0000 mg | ORAL_TABLET | Freq: Two times a day (BID) | ORAL | Status: DC
Start: 2022-02-11 — End: 2022-02-11
  Administered 2022-02-11: 200 mg via ORAL
  Filled 2022-02-11: qty 4

## 2022-02-11 MED ORDER — MORPHINE SULFATE (PF) 4 MG/ML IV SOLN
4.0000 mg | Freq: Once | INTRAVENOUS | Status: AC
  Administered 2022-02-11: 4 mg via INTRAVENOUS
  Filled 2022-02-11: qty 1

## 2022-02-11 MED ORDER — IOHEXOL 350 MG/ML SOLN
100.0000 mL | Freq: Once | INTRAVENOUS | Status: AC | PRN
  Administered 2022-02-11: 100 mL via INTRAVENOUS

## 2022-02-11 NOTE — ED Notes (Signed)
Teleneuro in progress. 

## 2022-02-11 NOTE — ED Notes (Signed)
Updated, assisted with phone.  ?

## 2022-02-11 NOTE — Progress Notes (Signed)
Consulted by EDP for possible admission given concerns for TIA.  Tele-neurology consultation performed with recommendations for patient to remain on aspirin 325 mg as well as Plavix 75 mg daily for 3 months and then aspirin 325 mg daily thereafter.  Recommendations are for follow-up with Dr. Merlene Laughter in the outpatient setting in 3 months as well as 30-day event monitor.  2D echocardiogram will be expedited and read by this afternoon to help facilitate discharge from ED.  She will not require inpatient admission. ? ?Total care time: 45 minutes. ?

## 2022-02-11 NOTE — ED Provider Notes (Signed)
?[facility]-EMERGENCY DEPT ?[facility] Emergency Department ?Provider Note ?MRN:  [id number]  ?Arrival date & time: 02/11/22    ? ?Chief Complaint   ?Back Pain ?  ?History of Present Illness   ?[name] is a 61 y.o. year-old female with a history of CAD, COPD, stroke presenting to the ED with chief complaint of back pain. ? ?Patient took an uneven step the other day and since then has been having pain in the right knee, lower back.  No numbness or weakness to the arms or legs, no bowel or bladder dysfunction.  Yesterday patient's vision "went out" in her right eye for a few minutes and then came back.  She is concerned that she had a small stroke.  She is also endorsing a change to her speech that occurred yesterday as well. ? ?Review of Systems  ?A thorough review of systems was obtained and all systems are negative except as noted in the HPI and PMH.  ? ?Patient's Health History   ? ?Past Medical History:  ?Diagnosis Date  ? AAA (abdominal aortic aneurysm) without rupture (HCC) 08/25/2016  ? Anxiety   ? Arthritis   ? Bipolar 1 disorder (HCC)   ? CAD (coronary artery disease)   ? a. 08/08/16 LAD 70%, 60% 1st diag, CTO RCA Med Rx EF 55%  ? Carotid artery stenosis   ? Right carotid endarterectomy 07/22/05 with recurrent stenosis 01/2017; known left ICA occlusion  ? COPD (chronic obstructive pulmonary disease) (HCC)   ? Crack cocaine use   ? Depression   ? Headache   ? Hypertension   ? Myocardial infarction [facility](HCC)   ? Right Groin Hematoma   ? a. 07/2016 following diagnostic cath  ? Stroke [facility](HCC) 2017  ?  ?Past Surgical History:  ?Procedure Laterality Date  ? CARDIAC CATHETERIZATION N/A 08/07/2016  ? Procedure: Left Heart Cath and Coronary Angiography;  Surgeon: Lennette Biharihomas A Kelly, MD;  Location: [facility] INVASIVE CV LAB;  Service: Cardiovascular;  Laterality: N/A;  ? CYST REMOVAL TRUNK Left   ? beneath L breast  ? ENDARTERECTOMY Right 02/08/2018  ? Procedure: ENDARTERECTOMY CAROTID REDO;  Surgeon: Chuck Hintickson, Christopher S,  MD;  [facility];  Service: Vascular;  Laterality: Right;  ? TUBAL LIGATION    ? VASCULAR SURGERY Right   ? right carotid endarterectomy 07/22/05   ?  ?Family History  ?Problem Relation Age of Onset  ? Heart attack Mother   ? Stroke Mother   ? Hypertension Mother   ? Diabetes Mother   ? Heart disease Mother   ? Heart attack Father   ? Hypertension Father   ? Heart disease Father   ? Cancer Maternal Aunt   ? Cancer Maternal Uncle   ? COPD Paternal Uncle   ?     2 pat uncles with COPD  ? Cancer Paternal Uncle   ?     1 pat uncle with cancer  ?  ?Social History  ? ?Socioeconomic History  ? Marital status: Single  ?  Spouse name: Not on file  ? Number of children: Not on file  ? Years of education: Not on file  ? Highest education level: Not on file  ?Occupational History  ? Not on file  ?Tobacco Use  ? Smoking status: Every Day  ?  Packs/day: 0.50  ?  Years: 42.00  ?  Pack years: 21.00  ?  Types: Cigarettes  ?  Start date: 10/05/1974  ? Smokeless tobacco: Never  ?Vaping Use  ?  Vaping Use: Never used  ?Substance and Sexual Activity  ? Alcohol use: No  ? Drug use: Yes  ?  Frequency: 2.0 times per week  ?  Types: Marijuana, "Crack" cocaine  ?  Comment: marijauna . reports no more crack since Feb 08, 2018  ? Sexual activity: Not Currently  ?Other Topics Concern  ? Not on file  ?Social History Narrative  ? Not on file  ? ?Social Determinants of Health  ? ?Financial Resource Strain: Not on file  ?Food Insecurity: Not on file  ?Transportation Needs: Not on file  ?Physical Activity: Not on file  ?Stress: Not on file  ?Social Connections: Not on file  ?Intimate Partner Violence: Not on file  ?  ? ?Physical Exam  ? ?Vitals:  ? 02/11/22 0530 02/11/22 0600  ?BP: (!) 142/78 (!) 145/87  ?Pulse: (!) 59 (!) 59  ?Resp: 19 19  ?Temp:    ?SpO2: 91% 96%  ?  ?CONSTITUTIONAL: Chronically ill-appearing, NAD ?NEURO/PSYCH:  Alert and oriented x 3, normal and symmetric strength and sensation, normal coordination, slurred speech ?EYES:  eyes  equal and reactive ?ENT/NECK:  no LAD, no JVD ?CARDIO: Regular rate, well-perfused, normal S1 and S2 ?PULM:  CTAB no wheezing or rhonchi ?GI/GU:  non-distended, non-tender ?MSK/SPINE:  No gross deformities, no edema ?SKIN:  no rash, atraumatic ? ? ?*Additional and/or pertinent findings included in MDM below ? ?Diagnostic and Interventional Summary  ? ? EKG Interpretation ? ?Date/Time:  Wednesday Feb 11 2022 02:37:31 EDT ?Ventricular Rate:  58 ?PR Interval:  203 ?QRS Duration: 109 ?QT Interval:  446 ?QTC Calculation: 439 ?R Axis:   27 ?Text Interpretation: Sinus rhythm Inferior infarct, old Consider anterior infarct Confirmed by Kennis Carina [phone]) on 02/11/2022 4:03:48 AM ?  ? ?  ? ?Labs Reviewed  ?COMPREHENSIVE METABOLIC PANEL - Abnormal; Notable for the following components:  ?    Result Value  ? Glucose, Bld 103 (*)   ? Calcium 8.8 (*)   ? All other components within normal limits  ?URINALYSIS, ROUTINE W REFLEX MICROSCOPIC - Abnormal; Notable for the following components:  ? Color, Urine STRAW (*)   ? All other components within normal limits  ?I-STAT CHEM 8, ED - Abnormal; Notable for the following components:  ? Glucose, Bld 101 (*)   ? Calcium, Ion 1.09 (*)   ? All other components within normal limits  ?RESP PANEL BY RT-PCR (FLU A&B, COVID) ARPGX2  ?ETHANOL  ?PROTIME-INR  ?APTT  ?CBC  ?DIFFERENTIAL  ?RAPID URINE DRUG SCREEN, HOSP PERFORMED  ?  ?DG Lumbar Spine Complete  ?Final Result  ?  ?CT ANGIO HEAD NECK W WO CM  ?Final Result  ?  ?DG Knee Complete 4 Views Left  ?Final Result  ?  ?DG Knee Complete 4 Views Right  ?Final Result  ?  ?MR BRAIN WO CONTRAST    (Results Pending)  ?MR LUMBAR SPINE WO CONTRAST    (Results Pending)  ?  ?Medications  ?fentaNYL (SUBLIMAZE) injection 50 mcg (50 mcg Intravenous Given 02/11/22 0334)  ?iohexol (OMNIPAQUE) 350 MG/ML injection 100 mL (100 mLs Intravenous Contrast Given 02/11/22 0337)  ?  ? ?Procedures  /  Critical Care ?Procedures ? ?ED Course and Medical Decision Making   ?Initial Impression and Ddx ?Concern for TIA versus stroke given the description of vision loss unilaterally, suspicious for amaurosis fugax.  Also the persistent speech disturbance.  Symptoms occurred greater than 24 hours ago, will start with CTA head and neck.  Patient's back  and knee pain seems more chronic, no significant trauma, obtaining screening x-rays. ? ?Past medical/surgical history that increases complexity of ED encounter: History of stroke ? ?Interpretation of Diagnostics ?I personally reviewed the EKG and my interpretation is as follows: Sinus rhythm ?   ?Labs reveal no significant blood count or electrolyte disturbance.  CTA imaging showing diffuse vascular disease, nothing obviously acute.  Awaiting MRI brain.  We will also obtain MRI lumbar spine given the bilateral radicular symptoms.  Signed out to oncoming provider. ? ?Patient Reassessment and Ultimate Disposition/Management ?Signed out to oncoming provider. ? ?Patient management required discussion with the following services or consulting groups:  None ? ?Complexity of Problems Addressed ?Acute illness or injury that poses threat of life of bodily function ? ?Additional Data Reviewed and Analyzed ?Further history obtained from: ?None ? ?Additional Factors Impacting ED Encounter Risk ?Consideration of hospitalization ? ?Elmer Sow. Pilar Plate, MD ?[facility] Emergency Medicine ?[facility] [email or web address] ? ?Final Clinical Impressions(s) / ED Diagnoses  ? ?  ICD-10-CM   ?1. Speech disturbance, unspecified type  R47.9   ?  ?  ?ED Discharge Orders   ? ? None  ? ?  ?  ? ?Discharge Instructions Discussed with and Provided to Patient:  ? ?Discharge Instructions   ?None ?  ? ?  ?Sabas Sous, MD ?02/11/22 5465 ? ?

## 2022-02-11 NOTE — ED Notes (Signed)
ED Provider at bedside. 

## 2022-02-11 NOTE — ED Notes (Signed)
Pt asleep when nurse entered room, RA sats at 87-88%, pt sats remained at 88% .  O2 reapplied [place] at 2 L/M-sats increased to 96 %. Will continue to monitor ?

## 2022-02-11 NOTE — ED Notes (Signed)
Pt verbalized understanding of her d/c instructions after review.   ?

## 2022-02-11 NOTE — ED Provider Notes (Signed)
Change of shift, care signed out from Dr. Langston Masker, patient with essentially negative bubble study, cardiology was informed and recommended the patient may be discharged home safely on dual antiplatelet therapy ?  ?[name] Chapel, MD ?02/11/22 1708 ? ?

## 2022-02-11 NOTE — ED Notes (Signed)
Pre-occupied with talking on phone. Family x2 at [facility]. Verbalizes pain improved.  ?

## 2022-02-11 NOTE — ED Notes (Signed)
Confirmed with pt prior to giving HTN meds about med and dosage.  ?

## 2022-02-11 NOTE — Progress Notes (Signed)
*  PRELIMINARY RESULTS* ?Echocardiogram ?2D Echocardiogram has been performed. ? ?Elpidio Anis ?02/11/2022, 3:52 PM ?

## 2022-02-11 NOTE — ED Notes (Addendum)
Back from MRI. Lying flat, alert, NAD, calm, interactive. Son at [facility].  Pt mentions continued BLE pain. Denies other sx or complaints.  ?

## 2022-02-11 NOTE — ED Triage Notes (Signed)
Pt c/o back pain that radiates to bilateral legs, she states she has been more active over the last week. Pt states Friday she lost vision in right eye x 15 minutes and not sure if voice sounds different than normal.  ?

## 2022-02-11 NOTE — ED Provider Notes (Signed)
61 yo female w/ hx of CAD, presenting with lower back pain after a misstep, sciatica bilaterally.  Also reported transient vision loss yesterday.  Pending MRI brain and Lumbar spine. ? ?0930 -MRIs reviewed, agree with radiology interpretation, degenerative disc disease and bulging disks likely correlate to the location of the patient's sciatica, which appears to be primarily around the L3-L4 distribution of the bilateral knees.  She does not have any indication of cauda equina or surgical emergency of the lumbar spine at this time. ? ?CT imaging and MR imaging of the head and neck do not demonstrate acute stroke, but do note significant vascular occlusions of several vessels of the head and neck.  She has had a right-sided endarterectomy in the past.  She is certainly high risk for TIA.  She appears to be only on aspirin 81 mg at home.   ? ?I spoke to the hospitalist Dr Sherryll Burger at 0930 for admission, and he requested and neurology consultation to guide treatment.   ? ?Patient and family (daughter) updated about diagnosis.  ? ?Update 3 pm - in discussion with Dr Melynda Ripple from neurology, patient has completed the entirety of her stroke work-up at this time with the exception of an echocardiogram, which has been ordered stat in the emergency department.  If this exam is unremarkable, the patient can be discharged on aspirin 325 mg and Plavix 75 mg daily for 3 months, followed subsequently by 325 mg of aspirin, and outpatient follow-up with Dr Gerilyn Pilgrim.  She would not require admission.  We can consider gabapentin for pain control at home (for lower back pain control).  Pending f/u on echocardiogram, signed out to Dr Hyacinth Meeker. ?  ?Terald Sleeper, MD ?02/11/22 1713 ? ?

## 2022-02-11 NOTE — Discharge Instructions (Addendum)
Please start taking clopidogrel once a day and aspirin once a day, Neurontin has been prescribed for your back, this may be a herniated disc and you will need to follow-up with the neurosurgeon.  Please see the phone numbers attached, call Dr. Gerilyn Pilgrim for an appointment within 1 week ? ?Emergency department for severe worsening symptoms ?

## 2022-02-11 NOTE — ED Notes (Signed)
EDP at [facility]. Pain med given. Family x2 at [facility]. VSS.  ?

## 2022-02-11 NOTE — Consult Note (Addendum)
I connected with  [name] on 02/11/22 by a video enabled telemedicine application and verified that I am speaking with the correct person using two identifiers. ?  ?I discussed the limitations of evaluation and management by telemedicine. The patient expressed understanding and agreed to proceed. ? ?Location of patient: [facility] ?Location of physician: [facility] ? ? ?Neurology Consultation ?Reason for Consult: Stroke ?Referring Physician: Dr. Octaviano Glow ? ?CC: Transient vision loss right eye ? ?History is obtained from: Patient, chart review ? ?HPI: [name] is a 61 y.o. female with past medical history of coronary artery disease, stroke with no residual deficits who initially presented to the emergency room with back pain.  During assessment with ED provider, patient reported she had transient vision loss in right eye for a few minutes with complete return to baseline.  Therefore neurology was consulted ? ?Patient reports she was in the car as a passenger on Friday 02/06/2022 when suddenly had painless loss of vision in right eye.  This lasted for about 2 minutes with complete resolution of symptoms.  Denies any headache, vision change, weakness, tingling, numbness.  Reports taking aspirin 325 mg daily.  Denies any other concerns. ? ?Last known normal: 5 days ago ?Event happened at home ?No tPA as NIHSS 0, outside window ?No thrombectomy as NIHSS 0, no large vessel occlusion ?mRS 0 ? ? ?ROS: All other systems reviewed and negative except as noted in the HPI.  ? ?Past Medical History:  ?Diagnosis Date  ? AAA (abdominal aortic aneurysm) without rupture ([place]) 08/25/2016  ? Anxiety   ? Arthritis   ? Bipolar 1 disorder ([place])   ? CAD (coronary artery disease)   ? a. 08/08/16 LAD 70%, 60% 1st diag, CTO RCA Med Rx EF 55%  ? Carotid artery stenosis   ? Right carotid endarterectomy 07/22/05 with recurrent stenosis 01/2017; known left ICA occlusion  ? COPD (chronic obstructive pulmonary  disease) ([place])   ? Crack cocaine use   ? Depression   ? Headache   ? Hypertension   ? Myocardial infarction [facility])   ? Right Groin Hematoma   ? a. 07/2016 following diagnostic cath  ? Stroke [facility]) 2017  ? ? ? ?Family History  ?Problem Relation Age of Onset  ? Heart attack Mother   ? Stroke Mother   ? Hypertension Mother   ? Diabetes Mother   ? Heart disease Mother   ? Heart attack Father   ? Hypertension Father   ? Heart disease Father   ? Cancer Maternal Aunt   ? Cancer Maternal Uncle   ? COPD Paternal Uncle   ?     2 pat uncles with COPD  ? Cancer Paternal Uncle   ?     1 pat uncle with cancer  ? ? ?Social History:  reports that she has been smoking cigarettes. She started smoking about 47 years ago. She has a 21.00 pack-year smoking history. She has never used smokeless tobacco. She reports current drug use. Frequency: 2.00 times per week. Drugs: Marijuana and "Crack" cocaine. She reports that she does not drink alcohol. ? ? ?Exam: ?Current vital signs: ?BP 123/73   Pulse (!) 59   Temp 97.8 ?F (36.6 ?C) (Oral)   Resp (!) 21   Ht 5\' 3"  (1.6 m)   Wt 93 kg   LMP 07/18/2011   SpO2 97%   BMI 36.31 kg/m?  ?Vital signs in last 24 hours: ?Temp:  [97.8 ?F (  36.6 ?C)] 97.8 ?F (36.6 ?C) (05/17 0155) ?Pulse Rate:  [55-71] 59 (05/17 1300) ?Resp:  [13-24] 21 (05/17 1300) ?BP: (123-167)/(71-102) 123/73 (05/17 1300) ?SpO2:  [86 %-98 %] 97 % (05/17 1300) ?Weight:  [93 kg] 93 kg (05/17 0153) ? ? ?Physical Exam  ?Constitutional: Appears well-developed and well-nourished.  ?Psych: Affect appropriate to situation ?Eyes: No scleral injection ?Neuro: AOx3, no aphasia, cranial nerves II to XII appear grossly intact, antigravity strength in all 4 extremities without drift, FTN intact bilaterally ? ?NIHSS 0 ? ?I have reviewed labs in epic and the results pertinent to this consultation are: ?CBC:  ?Recent Labs  ?Lab 02/11/22 ?0305 02/11/22 ?0311  ?WBC 7.1  --   ?NEUTROABS 3.1  --   ?HGB 12.8 12.9  ?HCT 40.2 38.0  ?MCV 94.8  --   ?PLT  291  --   ? ? ?Basic Metabolic Panel:  ?Lab Results  ?Component Value Date  ? NA 140 02/11/2022  ? K 3.8 02/11/2022  ? CO2 29 02/11/2022  ? GLUCOSE 101 (H) 02/11/2022  ? BUN 14 02/11/2022  ? CREATININE 0.90 02/11/2022  ? CALCIUM 8.8 (L) 02/11/2022  ? GFRNONAA >60 02/11/2022  ? GFRAA >60 03/12/2020  ? ?Lipid Panel:  ?Lab Results  ?Component Value Date  ? LDLCALC 146 (H) 12/24/2021  ? ?HgbA1c:  ?Lab Results  ?Component Value Date  ? HGBA1C 5.9 (H) 09/02/2021  ? ?Urine Drug Screen:  ?   ?Component Value Date/Time  ? LABOPIA POSITIVE (A) 02/11/2022 0455  ? [place] DETECTED 02/11/2022 0455  ? LABBENZ POSITIVE (A) 02/11/2022 0455  ? AMPHETMU NONE DETECTED 02/11/2022 0455  ? THCU POSITIVE (A) 02/11/2022 0455  ? [place] DETECTED 02/11/2022 0455  ?  ?Alcohol Level  ?   ?Component Value Date/Time  ? ETH <10 02/11/2022 0305  ? ? ? ?I have reviewed the images obtained: ? ?CTA head and neck 02/11/2022: ?1. Negative for emergent large vessel occlusion. ? 2. Positive for: ?- chronically Occluded Left Carotid Artery, with stable reconstitution of the left ICA terminus from the left ophthalmic artery. ?- chronically Occluded Left Vertebral Artery, reconstituted distally ?but atherosclerotic and stenotic in the V4 segment/left vertebrobasilar junction. ?- chronic High-grade stenosis proximal Left Subclavian Artery. ?- Moderate to Severe stenosis Right ICA siphon due to calcified ?plaque. ?- 50% atherosclerotic stenosis Right Subclavian Artery origin and ?moderate stenosis dominant Right Vertebral Artery origin. ? 3. Aortic Atherosclerosis (ICD10-I70.0) with mural clot at the ?undersurface of the aortic arch projecting into the lumen (series 8, ?image 172). ? 4. Suspect right carotid endarterectomy since 2018 with no adverse ?features. ? 5. Advanced chronic cerebral ischemic disease in association with ? ?MRI brain without contrast and MR angio head without contrast 02/11/2022: No acute intracranial abnormality. Advanced  chronic ischemic disease. Evidence of the chronically occluded left vertebral and internal carotid arteries. Chronic cortical infarct anterior left MCA territory. Superimposed patchy and confluent bilateral cerebral white matter T2 and FLAIR hyperintensity. Multifocal chronic lacunar infarcts in the bilateral deep gray matter nuclei.  ? ? ? ?ASSESSMENT/PLAN: 61 year old female presented with transient right eye vision loss with complete resolution of symptoms on 02/06/2022. ? 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 ?Recommendations: ?-Recommend continue aspirin 325 mg daily and Plavix 75 mg daily for 3 months followed by only aspirin 325 mg daily ?-Recommend continuing atorvastatin 80 mg daily for hyperlipidemia and secondary stroke prevention ?-Recommend TTE and  if no evidence of thrombus, recommend 30-day event monitor to look for paroxysmal A-fib ?-Of note, CTA head and neck showed mural clot in the aortic arch.  Recommend discussing with vascular surgery if they would recommend anticoagulation for this.  If they do recommend anticoagulation, hold antiplatelets.  If they do not recommend anticoagulation, continue dual antiplatelets as noted above ?-Goal blood pressure: Normotension, avoid hypotension ?-Discussed stroke risk factors, stroke signs and symptoms including BEFAST ?-Recommend follow-up with neurology in 3 months ? ?Thank you for allowing [place] to participate in the care of this patient. If you have any further questions, please contact  me or neurohospitalist.  ? ?Zeb Comfort ?Epilepsy ?[facility] neurohospitalist ? ? ? ? ?

## 2022-02-11 NOTE — ED Notes (Signed)
Pt not in room. Pt remains in MRI. ?

## 2022-02-12 ENCOUNTER — Telehealth: Payer: Self-pay | Admitting: *Deleted

## 2022-02-12 NOTE — Telephone Encounter (Signed)
Dr. Manuella Ghazi sent a secure chat that patient needs 30 day event monitor for TIA. Pt enrolled in [employer].

## 2022-02-13 ENCOUNTER — Telehealth: Payer: Self-pay | Admitting: *Deleted

## 2022-02-13 NOTE — Telephone Encounter (Signed)
Received a fax from [employer] stating pt declined patient benefit quote.

## 2022-02-19 ENCOUNTER — Telehealth: Payer: Self-pay | Admitting: Cardiology

## 2022-02-19 NOTE — Telephone Encounter (Signed)
Patient requesting a doctors note to request emergency medicaid.

## 2022-02-24 NOTE — Telephone Encounter (Signed)
Patient informed and verbalized understanding of plan. 

## 2022-02-24 NOTE — Telephone Encounter (Signed)
Patient returned call

## 2022-02-25 ENCOUNTER — Encounter: Payer: Self-pay | Admitting: Physician Assistant

## 2022-02-25 NOTE — Progress Notes (Signed)
Pt called in need of referral to Neurology and Neurosurgery. Pt states she is applying for Medicaid and requests a letter from PA.  Pt's Care Connect and eligibility with [facility] expired 12-31-21. Pt is no longer considered a pt with [place] until re-enrolled.  PA recommends for pt to apply with Care Connect instead of Medicaid. Care Connect can assist pt in applying for financial assistance for [facility] and [facility]; [place] can also help pt get re-established with [facility].Pt was informed [facility] has quick approvals, but pt must have financial assistance though [facility] and be f/u with PA in order to get referral. Pt verbalized understanding.  Pt was given Care Connect's phone number for pt to contact.

## 2022-02-27 ENCOUNTER — Emergency Department (HOSPITAL_COMMUNITY)
Admission: EM | Admit: 2022-02-27 | Discharge: 2022-02-27 | Disposition: A | Discharge status: Home / Self Care | Payer: Self-pay | Attending: Emergency Medicine | Admitting: Emergency Medicine

## 2022-02-27 ENCOUNTER — Encounter (HOSPITAL_COMMUNITY): Payer: Self-pay | Admitting: *Deleted

## 2022-02-27 ENCOUNTER — Other Ambulatory Visit: Payer: Self-pay

## 2022-02-27 ENCOUNTER — Emergency Department (HOSPITAL_COMMUNITY): Payer: Self-pay

## 2022-02-27 DIAGNOSIS — I10 Essential (primary) hypertension: Secondary | ICD-10-CM | POA: Insufficient documentation

## 2022-02-27 DIAGNOSIS — M5441 Lumbago with sciatica, right side: Secondary | ICD-10-CM | POA: Diagnosis not present

## 2022-02-27 DIAGNOSIS — Z7901 Long term (current) use of anticoagulants: Secondary | ICD-10-CM | POA: Insufficient documentation

## 2022-02-27 DIAGNOSIS — I2510 Atherosclerotic heart disease of native coronary artery without angina pectoris: Secondary | ICD-10-CM | POA: Insufficient documentation

## 2022-02-27 DIAGNOSIS — M5442 Lumbago with sciatica, left side: Secondary | ICD-10-CM | POA: Diagnosis not present

## 2022-02-27 DIAGNOSIS — Z79899 Other long term (current) drug therapy: Secondary | ICD-10-CM | POA: Insufficient documentation

## 2022-02-27 DIAGNOSIS — Z7982 Long term (current) use of aspirin: Secondary | ICD-10-CM | POA: Insufficient documentation

## 2022-02-27 MED ORDER — OXYCODONE-ACETAMINOPHEN 5-325 MG PO TABS: 1.0000 | ORAL_TABLET | Freq: Three times a day (TID) | ORAL | 0 refills | Status: AC | PRN

## 2022-02-27 MED ORDER — PREDNISONE 10 MG PO TABS: 30.0000 mg | ORAL_TABLET | Freq: Every day | ORAL | 0 refills | Status: AC

## 2022-02-27 MED ORDER — HYDROMORPHONE HCL 1 MG/ML IJ SOLN
1.0000 mg | Freq: Once | INTRAMUSCULAR | Status: AC
  Administered 2022-02-27: 1 mg via INTRAMUSCULAR
  Filled 2022-02-27: qty 1

## 2022-02-27 MED ORDER — ONDANSETRON 8 MG PO TBDP
8.0000 mg | ORAL_TABLET | Freq: Once | ORAL | Status: AC
  Administered 2022-02-27: 8 mg via ORAL
  Filled 2022-02-27: qty 1

## 2022-02-27 MED ORDER — CYCLOBENZAPRINE HCL 10 MG PO TABS: 10.0000 mg | ORAL_TABLET | Freq: Two times a day (BID) | ORAL | 0 refills | Status: DC | PRN

## 2022-02-27 NOTE — Discharge Instructions (Signed)
You have been seen here for back pain. I recommend taking over-the-counter pain medications like ibuprofen and/or Tylenol every 6 as needed.  Please follow dosage and on the back of bottle.  I also recommend applying heat to the area and stretching out the muscles as this will help decrease stiffness and pain.  I have given you information on exercises please follow..  I have given you a short course of narcotics please take as prescribed.  This medication can make you drowsy do not consume alcohol or operate heavy machinery when taking this medication.  This medication is Tylenol in it do not take Tylenol and take this medication.  I have also given you a prescription for a muscle relaxer this can make you drowsy do not consume alcohol or operate heavy machinery when taking this medication.   Please follow-up with neurosurgery.     Come back to the emergency department if you develop chest pain, shortness of breath, severe abdominal pain, uncontrolled nausea, vomiting, diarrhea.

## 2022-02-27 NOTE — ED Provider Notes (Signed)
[facility] EMERGENCY DEPARTMENT Provider Note   CSN: [id number] Arrival date & time: 02/27/22  1207     History  Chief Complaint  Patient presents with   Leg Pain    [name] is a 61 y.o. female.  HPI  Patient medical history including hypertension, CAD, CVA presents emerged prior complaints of lower back pain.  Patient states this started about 2 weeks ago, happened after she misstep from stairs, she states that she is having pain that goes from her back down to both her legs, will have occasional paresthesias in both feet.  Denies saddle paresthesias urinary or bowel incontinency, denies any recent trauma to the area.  States that she has chronic back pain.  She states she was seen 2 weeks ago and was discharged home to follow-up with neurosurgery.  Patient is currently not on any pain medication at this time.  She denies any urinary symptoms, no stomach pain nausea vomiting flank tenderness.  Reviewed patient's chart she was seen here 2 weeks ago, had extensive work-up including CT head, MRI of brain and back, most significant findings bulging disc at L4-L5 L5-S1 L2-L3  Home Medications Prior to Admission medications   Medication Sig Start Date End Date Taking? Authorizing Provider  cyclobenzaprine (FLEXERIL) 10 MG tablet Take 1 tablet (10 mg total) by mouth 2 (two) times daily as needed for muscle spasms. 02/27/22  Yes Carroll Sage, PA-C  oxyCODONE-acetaminophen (PERCOCET/ROXICET) 5-325 MG tablet Take 1 tablet by mouth every 8 (eight) hours as needed for up to 5 days for severe pain. 02/27/22 03/04/22 Yes Carroll Sage, PA-C  predniSONE (DELTASONE) 10 MG tablet Take 3 tablets (30 mg total) by mouth daily for 5 days. 02/27/22 03/04/22 Yes Carroll Sage, PA-C  albuterol (VENTOLIN HFA) 108 (90 Base) MCG/ACT inhaler INHALE 2 PUFFS BY MOUTH EVERY 6 HOURS AS NEEDED FOR COUGHING, WHEEZING, OR SHORTNESS OF BREATH Patient taking differently: Inhale 1-2 puffs into the lungs  every 6 (six) hours as needed for wheezing or shortness of breath. 11/27/21   Jacquelin Hawking, PA-C  Alirocumab 75 MG/ML SOAJ Inject 75 mg into the skin every 14 (fourteen) days. 01/05/22   Jacquelin Hawking, PA-C  amLODipine (NORVASC) 10 MG tablet TAKE 1 Tablet BY MOUTH ONCE EVERY DAY Patient taking differently: Take 10 mg by mouth daily. 11/27/21   Jacquelin Hawking, PA-C  aspirin 325 MG tablet Take 325 mg by mouth daily.    [provider]  aspirin 325 MG tablet Take 1 tablet (325 mg total) by mouth daily. 02/11/22 05/12/22  Terald Sleeper, MD  atorvastatin (LIPITOR) 80 MG tablet TAKE 1 Tablet BY MOUTH ONCE EVERY DAY AT 6PM Patient taking differently: Take 80 mg by mouth daily. 11/27/21   Jacquelin Hawking, PA-C  cloNIDine (CATAPRES) 0.2 MG tablet TAKE 1 Tablet  BY MOUTH TWICE DAILY Patient taking differently: Take 0.2 mg by mouth 2 (two) times daily. 02/09/22   Jacquelin Hawking, PA-C  clopidogrel (PLAVIX) 75 MG tablet Take 1 tablet (75 mg total) by mouth daily. 02/11/22 05/12/22  Terald Sleeper, MD  diclofenac (VOLTAREN) 75 MG EC tablet Take 1 tablet (75 mg total) by mouth 2 (two) times daily as needed. (Take with food) 09/02/21   Jacquelin Hawking, PA-C  furosemide (LASIX) 20 MG tablet TAKE 1 Tablet BY MOUTH ONCE EVERY DAY Patient taking differently: Take 20 mg by mouth daily. 11/27/21   Jacquelin Hawking, PA-C  gabapentin (NEURONTIN) 300 MG capsule Take 1 capsule (300 mg total)  by mouth 3 (three) times daily as needed. 02/11/22 03/13/22  Terald Sleeperrifan, Matthew J, MD  lisinopril (ZESTRIL) 40 MG tablet TAKE 1 Tablet BY MOUTH ONCE EVERY DAY Patient taking differently: Take 40 mg by mouth daily. 11/27/21   Jacquelin HawkingMcElroy, Shannon, PA-C  metoprolol tartrate (LOPRESSOR) 100 MG tablet TAKE 2 Tablets BY MOUTH TWICE DAILY Patient taking differently: Take 200 mg by mouth 2 (two) times daily. 02/09/22   Jacquelin HawkingMcElroy, Shannon, PA-C  nitroGLYCERIN (NITROSTAT) 0.4 MG SL tablet PLACE 1 TABLET UNDER TONGUE AS NEEDED FOR CHEST PAIN  EVERY 5 MINUTES X 3 MAX DOSES.  CALL 911 IF PAIN PERSISTS Patient taking differently: Place 0.4 mg under the tongue every 5 (five) minutes as needed for chest pain. 06/03/21   Jacquelin HawkingMcElroy, Shannon, PA-C      Allergies    Penicillins    Review of Systems   Review of Systems  Constitutional:  Negative for chills and fever.  Respiratory:  Negative for shortness of breath.   Cardiovascular:  Negative for chest pain.  Gastrointestinal:  Negative for abdominal pain.  Musculoskeletal:  Positive for back pain.  Neurological:  Negative for headaches.   Physical Exam Updated Vital Signs BP (!) 144/77 (BP Location: Left Arm)   Pulse 65   Temp 98.1 F (36.7 C) (Oral)   Resp 16   Ht 5\' 3"  (1.6 m)   Wt 93 kg   LMP 07/18/2011   SpO2 97%   BMI 36.31 kg/m  Physical Exam Vitals and nursing note reviewed.  Constitutional:      General: She is not in acute distress.    Appearance: She is not ill-appearing.  HENT:     Head: Normocephalic and atraumatic.     Nose: No congestion.  Eyes:     Conjunctiva/sclera: Conjunctivae normal.  Cardiovascular:     Rate and Rhythm: Normal rate and regular rhythm.     Pulses: Normal pulses.  Pulmonary:     Effort: Pulmonary effort is normal.  Abdominal:     General: Abdomen is flat.     Palpations: Abdomen is soft.     Tenderness: There is no abdominal tenderness. There is no right CVA tenderness or left CVA tenderness.  Musculoskeletal:     Comments: Spine was palpated no step-off or deformities noted, nontender my exam, no overlying skin changes.  Patient no tenderness in the muscular areas over the right and left iliac crest.  Patient is full range of motion 5 5 strength the lower extremities, neurovascular fully intact 2+ dorsal pedal pulses.  Skin:    General: Skin is warm and dry.  Neurological:     Mental Status: She is alert.  Psychiatric:        Mood and Affect: Mood normal.    ED Results / Procedures / Treatments   Labs (all labs ordered  are listed, but only abnormal results are displayed) Labs Reviewed - No data to display  EKG None  Radiology DG Knee Complete 4 Views Right  Result Date: 02/27/2022 CLINICAL DATA:  Knee pain. EXAM: RIGHT KNEE - COMPLETE 4+ VIEW COMPARISON:  None Available. FINDINGS: No evidence of fracture, dislocation, or joint effusion. Tricompartmental joint space narrowing prominent in the medial tibiofemoral and patellofemoral compartments with marginal osteophytes. IMPRESSION: Moderate tricompartmental knee osteoarthritis. Electronically Signed   By: Larose HiresImran  Ahmed D.O.   On: 02/27/2022 17:56    Procedures Procedures    Medications Ordered in ED Medications  HYDROmorphone (DILAUDID) injection 1 mg (1 mg Intramuscular Given 02/27/22 1753)  ondansetron (ZOFRAN-ODT) disintegrating tablet 8 mg (8 mg Oral Given 02/27/22 1752)    ED Course/ Medical Decision Making/ A&P                           Medical Decision Making Amount and/or Complexity of Data Reviewed Radiology: ordered.  Risk Prescription drug management.   This patient presents to the ED for concern of back pain, this involves an extensive number of treatment options, and is a complaint that carries with it a high risk of complications and morbidity.  The differential diagnosis includes dissection, spinal cord stenosis, UTI, pyelo-    Additional history obtained:  Additional history obtained from N/A External records from outside source obtained and reviewed including previous ER notes, imaging, medication list, medical history   Co morbidities that complicate the patient evaluation  CAD, chronic back pain  Social Determinants of Health:  No medical insurance    Lab Tests:  I Ordered, and personally interpreted labs.  The pertinent results include: N/A   Imaging Studies ordered:  I ordered imaging studies including x-ray of right knee I independently visualized and interpreted imaging which showed negative acute  findings I agree with the radiologist interpretation   Cardiac Monitoring:  The patient was maintained on a cardiac monitor.  I personally viewed and interpreted the cardiac monitored which showed an underlying rhythm of: N/A   Medicines ordered and prescription drug management:  I ordered medication including Dilaudid for pain I have reviewed the patients home medicines and have made adjustments as needed  Critical Interventions:  N/A   Reevaluation:  Presents with back pain, tender in the muscular region of the iliac crest bilaterally, no red flag symptoms.  Patient endorsing knee pain will obtain imaging, provide with pain medication and reassess  Patient is reassessed resting calmly, ambulating with some assistance, patient is agreeable for discharge at this time.  Consultations Obtained:  N/A   Test Considered:  MRI lumbar spine-we will defer spine suspicion for spinal cord abnormality very low at this time no red flag symptoms.    Rule out I have low suspicion for spinal fracture or spinal cord abnormality as patient denies urinary incontinency, retention, difficulty with bowel movements, denies saddle paresthesias.  Spine was palpated there is no step-off, crepitus or gross deformities felt, patient had 5/5 strength, full range of motion, neurovascular fully intact in the lower extremities.  Low suspicion for UTI, pyelo-, kidney stone denies any urinary symptoms no flank tenderness no suprapubic pain low suspicion for AAA or dissection presentation atypical no bulging mass, pain is focalized and reproducible . Low suspicion for septic arthritis as patient denies IV drug use, skin exam was performed no erythematous, edema or warm joints noted.     Dispostion and problem list  After consideration of the diagnostic results and the patients response to treatment, I feel that the patent would benefit from discharge.  Back pain-likely sciatica, will provide her pain  medication, steroids, muscle relaxers follow-up with neurosurgery for further evaluation.            Final Clinical Impression(s) / ED Diagnoses Final diagnoses:  Acute bilateral low back pain with bilateral sciatica    Rx / DC Orders ED Discharge Orders          Ordered    oxyCODONE-acetaminophen (PERCOCET/ROXICET) 5-325 MG tablet  Every 8 hours PRN        02/27/22 1925    predniSONE (DELTASONE) 10 MG  tablet  Daily        02/27/22 1925    cyclobenzaprine (FLEXERIL) 10 MG tablet  2 times daily PRN        02/27/22 1925              Barnie Del 02/27/22 1926    Benjiman Core, MD 02/28/22 0021

## 2022-02-27 NOTE — ED Triage Notes (Signed)
Pt c/o bil let pain and R lower back pain that she was evaluated here for last week and given gabapentin, pt denies bowel and bladder incontinence, pt reports inability to walk alone but needs assistance, A&O x4

## 2022-03-02 ENCOUNTER — Telehealth: Payer: Self-pay

## 2022-03-02 NOTE — Telephone Encounter (Signed)
Client renewed her Care Connect on 02/27/22 and stated then she was hurting so badly she planned to go to the ER at [facility].  Called today to follow up. She states they gave her some medications and that is helping her some. Encouraged her to call [facility] and let them know she renewed and to check if they would like to see her sooner than 03/26/22. She states she will call them tomorrow.  Will plan to follow as needed. Client ended conversation as she needed to go pick up her daughter from work.   Francee Nodal RN [employer]

## 2022-03-07 ENCOUNTER — Emergency Department (HOSPITAL_COMMUNITY)
Admission: EM | Admit: 2022-03-07 | Discharge: 2022-03-07 | Disposition: A | Discharge status: Home / Self Care | Payer: Medicaid Other | Attending: Emergency Medicine | Admitting: Emergency Medicine

## 2022-03-07 ENCOUNTER — Emergency Department (HOSPITAL_COMMUNITY): Payer: Medicaid Other

## 2022-03-07 ENCOUNTER — Encounter (HOSPITAL_COMMUNITY): Payer: Self-pay | Admitting: Emergency Medicine

## 2022-03-07 ENCOUNTER — Other Ambulatory Visit: Payer: Self-pay

## 2022-03-07 DIAGNOSIS — K5730 Diverticulosis of large intestine without perforation or abscess without bleeding: Secondary | ICD-10-CM | POA: Diagnosis not present

## 2022-03-07 DIAGNOSIS — I714 Abdominal aortic aneurysm, without rupture, unspecified: Secondary | ICD-10-CM | POA: Insufficient documentation

## 2022-03-07 DIAGNOSIS — K529 Noninfective gastroenteritis and colitis, unspecified: Secondary | ICD-10-CM | POA: Diagnosis not present

## 2022-03-07 DIAGNOSIS — R1084 Generalized abdominal pain: Secondary | ICD-10-CM | POA: Diagnosis present

## 2022-03-07 DIAGNOSIS — N281 Cyst of kidney, acquired: Secondary | ICD-10-CM | POA: Diagnosis not present

## 2022-03-07 DIAGNOSIS — D72829 Elevated white blood cell count, unspecified: Secondary | ICD-10-CM | POA: Diagnosis not present

## 2022-03-07 DIAGNOSIS — K6389 Other specified diseases of intestine: Secondary | ICD-10-CM | POA: Diagnosis not present

## 2022-03-07 LAB — COMPREHENSIVE METABOLIC PANEL
ALT: 15 U/L (ref 0–44)
AST: 18 U/L (ref 15–41)
Albumin: 3.6 g/dL (ref 3.5–5.0)
Alkaline Phosphatase: 104 U/L (ref 38–126)
Anion gap: 7 (ref 5–15)
BUN: 29 mg/dL — ABNORMAL HIGH (ref 8–23)
CO2: 26 mmol/L (ref 22–32)
Calcium: 8.6 mg/dL — ABNORMAL LOW (ref 8.9–10.3)
Chloride: 104 mmol/L (ref 98–111)
Creatinine, Ser: 1.17 mg/dL — ABNORMAL HIGH (ref 0.44–1.00)
GFR, Estimated: 53 mL/min — ABNORMAL LOW (ref >60)
Glucose, Bld: 156 mg/dL — ABNORMAL HIGH (ref 70–99)
Potassium: 3.5 mmol/L (ref 3.5–5.1)
Sodium: 137 mmol/L (ref 135–145)
Total Bilirubin: 0.9 mg/dL (ref 0.3–1.2)
Total Protein: 7.3 g/dL (ref 6.5–8.1)

## 2022-03-07 LAB — URINALYSIS, ROUTINE W REFLEX MICROSCOPIC
Bilirubin Urine: NEGATIVE
Glucose, UA: NEGATIVE mg/dL
Hgb urine dipstick: NEGATIVE
Ketones, ur: NEGATIVE mg/dL
Leukocytes,Ua: NEGATIVE
Nitrite: NEGATIVE
Protein, ur: NEGATIVE mg/dL
Specific Gravity, Urine: 1.032 — ABNORMAL HIGH (ref 1.005–1.030)
pH: 6 (ref 5.0–8.0)

## 2022-03-07 LAB — CBC
HCT: 46.8 % — ABNORMAL HIGH (ref 36.0–46.0)
Hemoglobin: 15.2 g/dL — ABNORMAL HIGH (ref 12.0–15.0)
MCH: 30.6 pg (ref 26.0–34.0)
MCHC: 32.5 g/dL (ref 30.0–36.0)
MCV: 94.2 fL (ref 80.0–100.0)
Platelets: 402 10*3/uL — ABNORMAL HIGH (ref 150–400)
RBC: 4.97 MIL/uL (ref 3.87–5.11)
RDW: 13.1 % (ref 11.5–15.5)
WBC: 19.1 10*3/uL — ABNORMAL HIGH (ref 4.0–10.5)
nRBC: 0 % (ref 0.0–0.2)

## 2022-03-07 LAB — LIPASE, BLOOD: Lipase: 19 U/L (ref 11–51)

## 2022-03-07 MED ORDER — METRONIDAZOLE 500 MG PO TABS
500.0000 mg | ORAL_TABLET | Freq: Once | ORAL | Status: AC
  Administered 2022-03-07: 500 mg via ORAL
  Filled 2022-03-07: qty 1

## 2022-03-07 MED ORDER — METRONIDAZOLE 500 MG PO TABS: 500.0000 mg | ORAL_TABLET | Freq: Two times a day (BID) | ORAL | 0 refills | Status: AC

## 2022-03-07 MED ORDER — HYDROMORPHONE HCL 1 MG/ML IJ SOLN
0.5000 mg | Freq: Once | INTRAMUSCULAR | Status: AC
  Administered 2022-03-07: 0.5 mg via INTRAVENOUS
  Filled 2022-03-07: qty 0.5

## 2022-03-07 MED ORDER — IOHEXOL 300 MG/ML  SOLN
100.0000 mL | Freq: Once | INTRAMUSCULAR | Status: AC | PRN
  Administered 2022-03-07: 100 mL via INTRAVENOUS

## 2022-03-07 MED ORDER — SODIUM CHLORIDE 0.9 % IV BOLUS
1000.0000 mL | Freq: Once | INTRAVENOUS | Status: AC
  Administered 2022-03-07: 1000 mL via INTRAVENOUS

## 2022-03-07 MED ORDER — ONDANSETRON 4 MG PO TBDP: 4.0000 mg | ORAL_TABLET | Freq: Three times a day (TID) | ORAL | 0 refills | Status: DC | PRN

## 2022-03-07 MED ORDER — ONDANSETRON HCL 4 MG/2ML IJ SOLN
4.0000 mg | Freq: Once | INTRAMUSCULAR | Status: AC
  Administered 2022-03-07: 4 mg via INTRAVENOUS
  Filled 2022-03-07 (×2): qty 2

## 2022-03-07 MED ORDER — CIPROFLOXACIN HCL 500 MG PO TABS
500.0000 mg | ORAL_TABLET | Freq: Two times a day (BID) | ORAL | 0 refills | Status: AC
Start: 2022-03-07 — End: 2022-03-14

## 2022-03-07 MED ORDER — ONDANSETRON HCL 4 MG/2ML IJ SOLN
4.0000 mg | Freq: Once | INTRAMUSCULAR | Status: AC
Start: 2022-03-07 — End: 2022-03-07
  Administered 2022-03-07: 4 mg via INTRAVENOUS
  Filled 2022-03-07: qty 2

## 2022-03-07 MED ORDER — CIPROFLOXACIN HCL 250 MG PO TABS
500.0000 mg | ORAL_TABLET | Freq: Once | ORAL | Status: AC
Start: 2022-03-07 — End: 2022-03-07
  Administered 2022-03-07: 500 mg via ORAL
  Filled 2022-03-07: qty 2

## 2022-03-07 NOTE — ED Notes (Signed)
Pt assisted to bathroom via wheelchair and back to bed 

## 2022-03-07 NOTE — ED Notes (Signed)
ED Provider at bedside. 

## 2022-03-07 NOTE — Discharge Instructions (Addendum)
Drink plenty of fluids.  Return to the Emergency department if not improving in the next 24 hours or if further vomiting.

## 2022-03-07 NOTE — ED Notes (Signed)
Pt returned from CT °

## 2022-03-07 NOTE — ED Triage Notes (Signed)
Patient c/o mid abd pain with nausea and vomiting that started last night. Per patient emesis is brown and "looks like coffee grounds". Patient reports hx of GI bleed x2-3 years ago after receiving IV contrast. Patient recently had IV contrast x1 week ago for possible stroke. Patient also reports one large watery stool last night but unsure of any blood.

## 2022-03-07 NOTE — ED Notes (Signed)
Patient transported to CT 

## 2022-03-08 NOTE — ED Provider Notes (Signed)
[facility] EMERGENCY DEPARTMENT Provider Note   CSN: [id number] Arrival date & time: 03/07/22  1258     History  Chief Complaint  Patient presents with   Abdominal Pain    [name] is a 61 y.o. female.  Patient complains of abdominal pain.  She complains of 1 episode of watery diarrhea .patient reports she has had nausea and vomiting.  She states she has had a past medical history of a GI bleed and becomes concerned when she vomits and has diarrhea.  Patient is also being followed for a abdominal aneurysm.  Patient reports that she has been told that it is currently stable and does not currently require surgery.  Patient denies having any fever she denies having any chills.  She has not had any exposure to questionable foods.  Patient states she has not been around anyone who has been sick.  The history is provided by the patient. No language interpreter was used.  Abdominal Pain Pain location:  Generalized Pain quality: aching   Pain radiates to:  Does not radiate Pain severity:  Moderate Onset quality:  Gradual Timing:  Constant Context: not alcohol use and not diet changes   Relieved by:  Nothing      Home Medications Prior to Admission medications   Medication Sig Start Date End Date Taking? Authorizing Provider  albuterol (VENTOLIN HFA) 108 (90 Base) MCG/ACT inhaler INHALE 2 PUFFS BY MOUTH EVERY 6 HOURS AS NEEDED FOR COUGHING, WHEEZING, OR SHORTNESS OF BREATH Patient taking differently: Inhale 1-2 puffs into the lungs every 6 (six) hours as needed for wheezing or shortness of breath. 11/27/21  Yes Jacquelin Hawking, PA-C  amLODipine (NORVASC) 10 MG tablet TAKE 1 Tablet BY MOUTH ONCE EVERY DAY Patient taking differently: Take 10 mg by mouth daily. 11/27/21  Yes Jacquelin Hawking, PA-C  aspirin 325 MG tablet Take 1 tablet (325 mg total) by mouth daily. 02/11/22 05/12/22 Yes Trifan, Kermit Balo, MD  atorvastatin (LIPITOR) 80 MG tablet TAKE 1 Tablet BY MOUTH ONCE EVERY DAY AT  6PM Patient taking differently: Take 80 mg by mouth daily. 11/27/21  Yes Jacquelin Hawking, PA-C  ciprofloxacin (CIPRO) 500 MG tablet Take 1 tablet (500 mg total) by mouth 2 (two) times daily for 7 days. 03/07/22 03/14/22 Yes Elson Areas, PA-C  cloNIDine (CATAPRES) 0.2 MG tablet TAKE 1 Tablet  BY MOUTH TWICE DAILY Patient taking differently: Take 0.2 mg by mouth 2 (two) times daily. 02/09/22  Yes Jacquelin Hawking, PA-C  clopidogrel (PLAVIX) 75 MG tablet Take 1 tablet (75 mg total) by mouth daily. 02/11/22 05/12/22 Yes Trifan, Kermit Balo, MD  cyclobenzaprine (FLEXERIL) 10 MG tablet Take 1 tablet (10 mg total) by mouth 2 (two) times daily as needed for muscle spasms. 02/27/22  Yes Carroll Sage, PA-C  diclofenac (VOLTAREN) 75 MG EC tablet Take 1 tablet (75 mg total) by mouth 2 (two) times daily as needed. (Take with food) Patient taking differently: Take 75 mg by mouth 2 (two) times daily as needed for mild pain. (Take with food) 09/02/21  Yes Jacquelin Hawking, PA-C  furosemide (LASIX) 20 MG tablet TAKE 1 Tablet BY MOUTH ONCE EVERY DAY Patient taking differently: Take 20 mg by mouth daily. 11/27/21  Yes Jacquelin Hawking, PA-C  gabapentin (NEURONTIN) 300 MG capsule Take 1 capsule (300 mg total) by mouth 3 (three) times daily as needed. Patient taking differently: Take 300 mg by mouth 3 (three) times daily as needed (leg pain). 02/11/22 03/13/22 Yes Trifan, Kermit Balo,  MD  lisinopril (ZESTRIL) 40 MG tablet TAKE 1 Tablet BY MOUTH ONCE EVERY DAY Patient taking differently: Take 40 mg by mouth daily. 11/27/21  Yes Jacquelin Hawking, PA-C  metoprolol tartrate (LOPRESSOR) 100 MG tablet TAKE 2 Tablets BY MOUTH TWICE DAILY Patient taking differently: Take 200 mg by mouth 2 (two) times daily. 02/09/22  Yes Jacquelin Hawking, PA-C  metroNIDAZOLE (FLAGYL) 500 MG tablet Take 1 tablet (500 mg total) by mouth 2 (two) times daily for 7 days. 03/07/22 03/14/22 Yes Cheron Schaumann K, PA-C  nitroGLYCERIN (NITROSTAT) 0.4 MG SL tablet  PLACE 1 TABLET UNDER TONGUE AS NEEDED FOR CHEST PAIN EVERY 5 MINUTES X 3 MAX DOSES.  CALL 911 IF PAIN PERSISTS Patient taking differently: Place 0.4 mg under the tongue every 5 (five) minutes as needed for chest pain. 06/03/21  Yes Jacquelin Hawking, PA-C  ondansetron (ZOFRAN-ODT) 4 MG disintegrating tablet Take 1 tablet (4 mg total) by mouth every 8 (eight) hours as needed for nausea or vomiting. 03/07/22  Yes Elson Areas, PA-C  oxyCODONE-acetaminophen (PERCOCET/ROXICET) 5-325 MG tablet Take 1 tablet by mouth every 4 (four) hours as needed for severe pain.   Yes [provider]  Alirocumab 75 MG/ML SOAJ Inject 75 mg into the skin every 14 (fourteen) days. 01/05/22   Jacquelin Hawking, PA-C      Allergies    Penicillins    Review of Systems   Review of Systems  Gastrointestinal:  Positive for abdominal pain.  All other systems reviewed and are negative.   Physical Exam Updated Vital Signs BP (!) 176/80 (BP Location: Left Arm)   Pulse 75   Temp 97.9 F (36.6 C) (Oral)   Resp 18   Ht 5\' 3"  (1.6 m)   Wt 93 kg   LMP 07/18/2011   SpO2 97%   BMI 36.31 kg/m  Physical Exam Vitals and nursing note reviewed.  Constitutional:      Appearance: She is well-developed.  HENT:     Head: Normocephalic.  Eyes:     Extraocular Movements: Extraocular movements intact.  Cardiovascular:     Rate and Rhythm: Normal rate and regular rhythm.  Pulmonary:     Effort: Pulmonary effort is normal.  Abdominal:     General: Abdomen is flat. Bowel sounds are normal. There is no distension.     Palpations: Abdomen is soft.     Tenderness: There is generalized abdominal tenderness.     Hernia: No hernia is present.  Musculoskeletal:        General: Normal range of motion.     Cervical back: Normal range of motion.  Skin:    General: Skin is warm.  Neurological:     Mental Status: She is alert and oriented to person, place, and time.     ED Results / Procedures / Treatments   Labs (all  labs ordered are listed, but only abnormal results are displayed) Labs Reviewed  COMPREHENSIVE METABOLIC PANEL - Abnormal; Notable for the following components:      Result Value   Glucose, Bld 156 (*)    BUN 29 (*)    Creatinine, Ser 1.17 (*)    Calcium 8.6 (*)    GFR, Estimated 53 (*)    All other components within normal limits  CBC - Abnormal; Notable for the following components:   WBC 19.1 (*)    Hemoglobin 15.2 (*)    HCT 46.8 (*)    Platelets 402 (*)    All other components within normal  limits  URINALYSIS, ROUTINE W REFLEX MICROSCOPIC - Abnormal; Notable for the following components:   Specific Gravity, Urine 1.032 (*)    All other components within normal limits  LIPASE, BLOOD    EKG None  Radiology CT ABDOMEN PELVIS W CONTRAST  Result Date: 03/07/2022 CLINICAL DATA:  Abdominal pain, nausea, vomiting EXAM: CT ABDOMEN AND PELVIS WITH CONTRAST TECHNIQUE: Multidetector CT imaging of the abdomen and pelvis was performed using the standard protocol following bolus administration of intravenous contrast. RADIATION DOSE REDUCTION: This exam was performed according to the departmental dose-optimization program which includes automated exposure control, adjustment of the mA and/or kV according to patient size and/or use of iterative reconstruction technique. CONTRAST:  OMNIPAQUE IOHEXOL 300 MG/ML  SOLN COMPARISON:  08/25/2016 FINDINGS: Lower chest: Visualized lower lung fields are unremarkable. There are scattered coronary artery calcifications. Left hemidiaphragm is elevated. Hepatobiliary: No focal abnormality is seen in the liver. There is no dilation of bile ducts. Gallbladder is unremarkable. Pancreas: No focal abnormality is seen. Spleen: Unremarkable. Adrenals/Urinary Tract: Adrenals are unremarkable. There is no hydronephrosis. There are few scattered calcifications in the renal artery branches. There is a 1 mm calcification in the lower pole of left kidney possibly  suggesting tiny nonobstructing punctate renal stone. there is 2.4 cm cyst in the upper pole of left kidney. There are foci of cortical thinning in the kidneys, more so in the left kidney suggesting possible scarring from chronic pyelonephritis. Urinary bladder is not distended. Lower portion of bladder is projecting below the level of pubic symphysis, possibly suggesting cystocele. Stomach/Bowel: There is moderate distention of stomach with fluid in the lumen. Small bowel loops are not dilated. Appendix is not dilated. There is wall thickening in the transverse colon and descending colon. There is mild pericolic stranding in the transverse and descending colon. Multiple diverticula are seen in colon without signs of focal diverticulitis. Vascular/Lymphatic: There is fusiform aneurysmal dilation of infrarenal abdominal aorta measuring 4.5 cm in maximum diameter. There is interval increase in size. Part of the lumen of the aneurysm is filled with thrombus. Extensive calcifications are seen in the aorta and its major branches. Possible high-grade stenosis is noted in the common iliac arteries. Reproductive: There is 4.1 cm smooth marginated fluid density lesion in the left adnexa. There is 2.8 cm fluid density structure in the right adnexa. There are no internal septations or mural nodules. Other: There is no pneumoperitoneum. Minimal amount of pelvic ascites is seen. This may be related to colitis or suggest recent rupture of ovarian cyst or follicle. Musculoskeletal: There is minimal retrolisthesis at L2-L3 level and minimal anterolisthesis at L4-L5 level. There is encroachment of neural foramina at multiple levels. IMPRESSION: There is wall thickening in the transverse and descending colon suggesting active inflammatory or infectious colitis. There is no loculated pericolic fluid collection. There is no evidence of intestinal obstruction or pneumoperitoneum. There is no hydronephrosis. Appendix is not dilated. There  is 4.5 cm fusiform aneurysm in the infrarenal abdominal aorta with interval increase in size. Recommend follow-up every 6 months and vascular consultation.Reference: [facility] [phone]. Diverticulosis of colon without signs of diverticulitis. There is 4.1 cm cyst in the left adnexa. There is 2.8 cm cyst in the right adnexa. Findings may suggest dominant follicle and functional cyst. Follow-up pelvic sonogram should be considered to assess resolution. Other findings as described in the body of the report. Electronically Signed   By: Ernie Avena M.D.   On: 03/07/2022 15:16  Procedures Procedures    Medications Ordered in ED Medications  ondansetron (ZOFRAN) injection 4 mg (4 mg Intravenous Given 03/07/22 1412)  iohexol (OMNIPAQUE) 300 MG/ML solution 100 mL (100 mLs Intravenous Contrast Given 03/07/22 1423)  ciprofloxacin (CIPRO) tablet 500 mg (500 mg Oral Given 03/07/22 1801)  metroNIDAZOLE (FLAGYL) tablet 500 mg (500 mg Oral Given 03/07/22 1801)  sodium chloride 0.9 % bolus 1,000 mL (0 mLs Intravenous Stopped 03/07/22 1856)  HYDROmorphone (DILAUDID) injection 0.5 mg (0.5 mg Intravenous Given 03/07/22 1800)  ondansetron (ZOFRAN) injection 4 mg (4 mg Intravenous Given 03/07/22 1759)    ED Course/ Medical Decision Making/ A&P                           Medical Decision Making Patient complains of nausea vomiting diarrhea and abdominal pain  Amount and/or Complexity of Data Reviewed External Data Reviewed: notes.    Details: Cardiology and vascular surgeon notes reviewed patient has a stable abdominal aortic aneurysm Labs: ordered. Decision-making details documented in ED Course.    Details: Laboratory evaluations ordered reviewed and interpreted patient's glucose is slightly elevated at 156 BUN is 29 creatinine is 1.17 patient's GFR is 53 patient has an elevated white blood cell count of 19.1 Radiology: ordered and independent interpretation performed. Decision-making  details documented in ED Course.    Details: CT abdomen ordered reviewed and interpreted to be obtained due to patient's history of aortic aneurysm and current discomfort CT shows wall thickening in the transverse and descending colon suggestive of inflammatory or infectious colitis.  It has a 4.5 cm aneurysm in the infrarenal abdominal aorta.  Patient has bilateral adnexal cyst which radiologist advised should be followed up with ultrasound for resolution  Risk Prescription drug management. Risk Details: Patient is given IV fluids x1 L due to increasing BUN and creatinine.  I suspect patient does have an infectious colitis.  Wants to go home she is already called her ride I discussed treating her inpatient versus outpatient and patient is adamant that she wants to go home.  Patient is given Cipro and Flagyl here and is given a prescription for Cipro and Flagyl as well as Zofran for her nausea she is advised she will need to follow-up with her primary care physician.  Patient is given strict precautions on the need to return she is to return if she is unable to tolerate p.o. fluids if she has further vomiting fever or worsening symptoms           Final Clinical Impression(s) / ED Diagnoses Final diagnoses:  Colitis    Rx / DC Orders ED Discharge Orders          Ordered    ciprofloxacin (CIPRO) 500 MG tablet  2 times daily        03/07/22 1839    metroNIDAZOLE (FLAGYL) 500 MG tablet  2 times daily        03/07/22 1839    ondansetron (ZOFRAN-ODT) 4 MG disintegrating tablet  Every 8 hours PRN        03/07/22 1840           An After Visit Summary was printed and given to the patient.    Elson AreasSofia, Suzanne Kho K, PA-C 03/08/22 1006    Horton, Clabe SealKristie M, DO 03/08/22 1247

## 2022-03-09 NOTE — Congregational Nurse Program (Signed)
Client was contacted through a telephone intervention, wanted to check on her mental health wellbeing. Client didn't need a lot of resources expected concern making an early appointment for free clinic. Client said they could call the free clinic on their own.

## 2022-03-11 ENCOUNTER — Ambulatory Visit: Payer: Medicaid Other | Admitting: Physician Assistant

## 2022-03-19 ENCOUNTER — Ambulatory Visit: Payer: Self-pay | Admitting: Vascular Surgery

## 2022-03-26 ENCOUNTER — Ambulatory Visit: Payer: Medicaid Other | Admitting: Physician Assistant

## 2022-04-16 ENCOUNTER — Encounter: Payer: Self-pay | Admitting: Vascular Surgery

## 2022-04-16 ENCOUNTER — Ambulatory Visit: Payer: Medicaid Other | Admitting: Vascular Surgery

## 2022-04-16 VITALS — BP 134/75 | HR 57 | Temp 97.9°F | Resp 20 | Ht 63.0 in | Wt 203.0 lb

## 2022-04-16 DIAGNOSIS — I7143 Infrarenal abdominal aortic aneurysm, without rupture: Secondary | ICD-10-CM

## 2022-04-16 DIAGNOSIS — I739 Peripheral vascular disease, unspecified: Secondary | ICD-10-CM | POA: Diagnosis not present

## 2022-04-16 NOTE — Progress Notes (Signed)
REASON FOR VISIT:   Follow-up of abdominal aortic aneurysm.  MEDICAL ISSUES:   ABDOMINAL AORTIC ANEURYSM: The patient's abdominal aortic aneurysm is unchanged in size.  When I saw her back in January of this year it was 4.7 cm in maximum diameter by duplex.  By CT scan in June it is 4.5 cm.  This but there is been no significant change in the size of the aneurysm.  She understands we would not consider elective repair unless it reached 5.5 cm in maximum diameter.  We have discussed the importance of tobacco cessation.  I have ordered a follow-up duplex scan in 6 months.  We have again discussed the importance of tobacco cessation.  HISTORY OF BILATERAL CAROTID DISEASE: She has a known left internal carotid artery occlusion.  Her most recent duplex scan showed no evidence of recurrent carotid stenosis on the right where she is undergone carotid endarterectomy.  I have ordered a follow-up carotid duplex scan in 6 months and I will see her back at that time.  She is on aspirin and is on a statin.  PERIPHERAL ARTERIAL DISEASE: She has been having significant leg pain however I think this can be attributed to her degenerative disc disease of her back.  I have encouraged her to stay as active as possible.  We will get follow-up arterial Doppler studies when she returns in 6 months.    HPI:   [name] is a pleasant 61 y.o. female who I been following with an abdominal aortic aneurysm.  When I last saw her it was 4.7 cm in maximum diameter which was increased slightly compared to 4.5 cm on the previous study.  She was to come in for a 57-month follow-up ultrasound today.  However in the interim in June she developed some abdominal pain which prompted a CT scan.  This showed that the aneurysm had not changed in size and was 4.5 cm in maximum diameter.  She was found to have infectious colitis and was treated for antibiotics which she has completed and her symptoms have resolved.  She does  continue to smoke.  She has had some leg pain but she has been worked up and has significant degenerative disc disease of her back.  She does describe some calf claudication.  I do not get any history of rest pain or nonhealing ulcers.  She denies any history of stroke, TIAs, expressive or receptive aphasia, or amaurosis fugax.  Past Medical History:  Diagnosis Date   AAA (abdominal aortic aneurysm) without rupture (HCC) 08/25/2016   Anxiety    Arthritis    Bipolar 1 disorder (HCC)    CAD (coronary artery disease)    a. 08/08/16 LAD 70%, 60% 1st diag, CTO RCA Med Rx EF 55%   Carotid artery stenosis    Right carotid endarterectomy 07/22/05 with recurrent stenosis 01/2017; known left ICA occlusion   COPD (chronic obstructive pulmonary disease) (HCC)    Crack cocaine use    Depression    Headache    Hypertension    Myocardial infarction [facility])    Right Groin Hematoma    a. 07/2016 following diagnostic cath   Stroke [facility]) 2017    Family History  Problem Relation Age of Onset   Heart attack Mother    Stroke Mother    Hypertension Mother    Diabetes Mother    Heart disease Mother    Heart attack Father    Hypertension Father    Heart disease  Father    Cancer Maternal Aunt    Cancer Maternal Uncle    COPD Paternal Uncle        2 pat uncles with COPD   Cancer Paternal Uncle        1 pat uncle with cancer    SOCIAL HISTORY: Social History   Tobacco Use   Smoking status: Every Day    Packs/day: 0.50    Years: 42.00    Total pack years: 21.00    Types: Cigarettes    Start date: 10/05/1974   Smokeless tobacco: Never  Substance Use Topics   Alcohol use: No    Allergies  Allergen Reactions   Penicillins Hives and Swelling    PATIENT HAS HAD A PCN REACTION WITH IMMEDIATE RASH, FACIAL/TONGUE/THROAT SWELLING, SOB, OR LIGHTHEADEDNESS WITH HYPOTENSION:  #  #  YES  #  # HAS PT DEVELOPED SEVERE RASH INVOLVING MUCUS MEMBRANES or SKIN NECROSIS: #  #  YES  #  # Has patient had a  PCN reaction that required hospitalization No Has patient had a PCN reaction occurring within the last 10 years: Non If all of the above answers are "NO", then may proceed with Cephalosporin use.     Current Outpatient Medications  Medication Sig Dispense Refill   albuterol (VENTOLIN HFA) 108 (90 Base) MCG/ACT inhaler INHALE 2 PUFFS BY MOUTH EVERY 6 HOURS AS NEEDED FOR COUGHING, WHEEZING, OR SHORTNESS OF BREATH (Patient taking differently: Inhale 1-2 puffs into the lungs every 6 (six) hours as needed for wheezing or shortness of breath.) 20.1 g 1   Alirocumab 75 MG/ML SOAJ Inject 75 mg into the skin every 14 (fourteen) days. 1 mL 3   amLODipine (NORVASC) 10 MG tablet TAKE 1 Tablet BY MOUTH ONCE EVERY DAY (Patient taking differently: Take 10 mg by mouth daily.) 90 tablet 1   aspirin 325 MG tablet Take 1 tablet (325 mg total) by mouth daily. 90 tablet 0   atorvastatin (LIPITOR) 80 MG tablet TAKE 1 Tablet BY MOUTH ONCE EVERY DAY AT 6PM (Patient taking differently: Take 80 mg by mouth daily.) 90 tablet 1   cloNIDine (CATAPRES) 0.2 MG tablet TAKE 1 Tablet  BY MOUTH TWICE DAILY (Patient taking differently: Take 0.2 mg by mouth 2 (two) times daily.) 180 tablet 1   clopidogrel (PLAVIX) 75 MG tablet Take 1 tablet (75 mg total) by mouth daily. 90 tablet 0   cyclobenzaprine (FLEXERIL) 10 MG tablet Take 1 tablet (10 mg total) by mouth 2 (two) times daily as needed for muscle spasms. 20 tablet 0   diclofenac (VOLTAREN) 75 MG EC tablet Take 1 tablet (75 mg total) by mouth 2 (two) times daily as needed. (Take with food) (Patient taking differently: Take 75 mg by mouth 2 (two) times daily as needed for mild pain. (Take with food)) 60 tablet 0   furosemide (LASIX) 20 MG tablet TAKE 1 Tablet BY MOUTH ONCE EVERY DAY (Patient taking differently: Take 20 mg by mouth daily.) 90 tablet 1   lisinopril (ZESTRIL) 40 MG tablet TAKE 1 Tablet BY MOUTH ONCE EVERY DAY (Patient taking differently: Take 40 mg by mouth daily.) 90  tablet 1   metoprolol tartrate (LOPRESSOR) 100 MG tablet TAKE 2 Tablets BY MOUTH TWICE DAILY (Patient taking differently: Take 200 mg by mouth 2 (two) times daily.) 180 tablet 1   nitroGLYCERIN (NITROSTAT) 0.4 MG SL tablet PLACE 1 TABLET UNDER TONGUE AS NEEDED FOR CHEST PAIN EVERY 5 MINUTES X 3 MAX DOSES.  CALL 911  IF PAIN PERSISTS (Patient taking differently: Place 0.4 mg under the tongue every 5 (five) minutes as needed for chest pain.) 25 tablet PRN   ondansetron (ZOFRAN-ODT) 4 MG disintegrating tablet Take 1 tablet (4 mg total) by mouth every 8 (eight) hours as needed for nausea or vomiting. 20 tablet 0   oxyCODONE-acetaminophen (PERCOCET/ROXICET) 5-325 MG tablet Take 1 tablet by mouth every 4 (four) hours as needed for severe pain.     gabapentin (NEURONTIN) 300 MG capsule Take 1 capsule (300 mg total) by mouth 3 (three) times daily as needed. (Patient taking differently: Take 300 mg by mouth 3 (three) times daily as needed (leg pain).) 90 capsule 0   No current facility-administered medications for this visit.    REVIEW OF SYSTEMS:  [X]  denotes positive finding, [ ]  denotes negative finding Cardiac  Comments:  Chest pain or chest pressure:    Shortness of breath upon exertion: x   Short of breath when lying flat:    Irregular heart rhythm:        Vascular    Pain in calf, thigh, or hip brought on by ambulation: x   Pain in feet at night that wakes you up from your sleep:  x   Blood clot in your veins:    Leg swelling:  x       Pulmonary    Oxygen at home:    Productive cough:     Wheezing:         Neurologic    Sudden weakness in arms or legs:  x   Sudden numbness in arms or legs:     Sudden onset of difficulty speaking or slurred speech:    Temporary loss of vision in one eye:     Problems with dizziness:         Gastrointestinal    Blood in stool:     Vomited blood:         Genitourinary    Burning when urinating:     Blood in urine:        Psychiatric    Major  depression:         Hematologic    Bleeding problems:    Problems with blood clotting too easily:        Skin    Rashes or ulcers:        Constitutional    Fever or chills:     PHYSICAL EXAM:   Vitals:   04/16/22 1257  BP: 134/75  Pulse: (!) 57  Resp: 20  Temp: 97.9 F (36.6 C)  SpO2: 95%  Weight: 203 lb (92.1 kg)  Height: 5\' 3"  (1.6 m)   Body mass index is 35.96 kg/m.  GENERAL: The patient is a well-nourished female, in no acute distress. The vital signs are documented above. CARDIAC: There is a regular rate and rhythm.  VASCULAR: I do not detect carotid bruits. She has palpable femoral pulses. Both feet are warm and well-perfused. PULMONARY: There is good air exchange bilaterally without wheezing or rales. ABDOMEN: Soft and non-tender with normal pitched bowel sounds.  I cannot palpate her aneurysm because of her body habitus. MUSCULOSKELETAL: There are no major deformities or cyanosis. NEUROLOGIC: No focal weakness or paresthesias are detected. SKIN: There are no ulcers or rashes noted. PSYCHIATRIC: The patient has a normal affect.  DATA:    CT ABDOMEN PELVIS: I reviewed the images of her CT abdomen pelvis that was done on 03/07/2022.  This was done for abdominal  pain and she was found to have evidence of infectious colitis.  Her aneurysm was 4.5 cm in maximum diameter.  CAROTID DUPLEX: I did review her previous carotid duplex scan that was done in July 2022.  She has a known left ICA occlusion.  On the right she had she had no significant stenosis.  ARTERIAL DOPPLER STUDY: I did review her previous arterial Doppler studies.  These were reassuring July 2022.  She had biphasic Doppler signals in the dorsalis pedis and posterior tibial positions bilaterally.  She had an ABI of 100% on the right and 89% on the left.  ECHO: I did review her echo that was done in May of this year.  Ejection fraction was 60 to 65%.  She had normal LV function.  Waverly Ferrari [facility] [phone]

## 2022-04-22 ENCOUNTER — Other Ambulatory Visit: Payer: Self-pay

## 2022-04-22 DIAGNOSIS — I6523 Occlusion and stenosis of bilateral carotid arteries: Secondary | ICD-10-CM

## 2022-04-22 DIAGNOSIS — I739 Peripheral vascular disease, unspecified: Secondary | ICD-10-CM

## 2022-04-22 DIAGNOSIS — I7143 Infrarenal abdominal aortic aneurysm, without rupture: Secondary | ICD-10-CM

## 2022-05-19 DIAGNOSIS — M7989 Other specified soft tissue disorders: Secondary | ICD-10-CM | POA: Diagnosis not present

## 2022-05-19 DIAGNOSIS — W010XXA Fall on same level from slipping, tripping and stumbling without subsequent striking against object, initial encounter: Secondary | ICD-10-CM | POA: Diagnosis not present

## 2022-05-19 DIAGNOSIS — M25562 Pain in left knee: Secondary | ICD-10-CM | POA: Diagnosis not present

## 2022-05-19 DIAGNOSIS — G8911 Acute pain due to trauma: Secondary | ICD-10-CM | POA: Diagnosis not present

## 2022-05-19 DIAGNOSIS — Z880 Allergy status to penicillin: Secondary | ICD-10-CM | POA: Diagnosis not present

## 2022-05-19 DIAGNOSIS — M25462 Effusion, left knee: Secondary | ICD-10-CM | POA: Diagnosis not present

## 2022-05-19 DIAGNOSIS — M1712 Unilateral primary osteoarthritis, left knee: Secondary | ICD-10-CM | POA: Diagnosis not present

## 2022-05-21 ENCOUNTER — Ambulatory Visit: Payer: Self-pay | Admitting: Cardiology

## 2022-07-02 DIAGNOSIS — M48062 Spinal stenosis, lumbar region with neurogenic claudication: Secondary | ICD-10-CM | POA: Diagnosis not present

## 2022-07-21 NOTE — Progress Notes (Unsigned)
Cardiology Office Note  Date: 07/22/2022   ID: [name], DOB 02-28-1961, MRN [id number]  PCP:  [facility]  Cardiologist:  Nona Dell, MD Electrophysiologist:  None   Chief Complaint  Patient presents with   Cardiac follow-up    History of Present Illness: [name] is a 61 y.o. female last seen in February.  She is here for a routine visit.  Reports no angina and stable NYHA class II dyspnea on current regimen.  We went over her medications today.  Discussed reducing aspirin to 81 mg daily.  Also plan to recheck FLP on Lipitor 80 mg daily.  She reports no intolerances with her antihypertensive regimen and blood pressure looks reasonable today.  I personally reviewed her ECG which shows sinus bradycardia with old inferior infarct pattern, rule out old anterior infarct pattern, low voltage.  Office visit noted with Dr. Edilia Bo in July for follow-up of AAA, most recently 4.5 cm and asymptomatic.  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  Past Medical History:  Diagnosis Date   AAA (abdominal aortic aneurysm) without rupture (HCC) 08/25/2016   Anxiety    Arthritis    Bipolar 1 disorder (HCC)    CAD (coronary artery disease)    a. 08/08/16 LAD 70%, 60% 1st diag, CTO RCA Med Rx EF 55%   Carotid artery stenosis    Right carotid endarterectomy 07/22/05 with recurrent stenosis 01/2017; known left ICA occlusion   COPD (chronic obstructive pulmonary disease) (HCC)    Crack cocaine use    Depression    Headache    Hypertension    Myocardial infarction [facility])    Right Groin Hematoma    a. 07/2016 following diagnostic cath   Stroke [facility]) 2017    Past Surgical History:  Procedure Laterality Date   CARDIAC CATHETERIZATION N/A 08/07/2016   Procedure: Left Heart Cath and Coronary Angiography;  Surgeon: Lennette Bihari, MD;  Location: [facility] INVASIVE CV LAB;  Service: Cardiovascular;  Laterality: N/A;   CYST REMOVAL TRUNK  Left    beneath L breast   ENDARTERECTOMY Right 02/08/2018   Procedure: ENDARTERECTOMY CAROTID REDO;  Surgeon: Chuck Hint, MD;  Location: [facility] OR;  Service: Vascular;  Laterality: Right;   TUBAL LIGATION     VASCULAR SURGERY Right    right carotid endarterectomy 07/22/05     Current Outpatient Medications  Medication Sig Dispense Refill   albuterol (VENTOLIN HFA) 108 (90 Base) MCG/ACT inhaler INHALE 2 PUFFS BY MOUTH EVERY 6 HOURS AS NEEDED FOR COUGHING, WHEEZING, OR SHORTNESS OF BREATH (Patient taking differently: Inhale 1-2 puffs into the lungs every 6 (six) hours as needed for wheezing or shortness of breath.) 20.1 g 1   Alirocumab 75 MG/ML SOAJ Inject 75 mg into the skin every 14 (fourteen) days. 1 mL 3   amLODipine (NORVASC) 10 MG tablet TAKE 1 Tablet BY MOUTH ONCE EVERY DAY (Patient taking differently: Take 10 mg by mouth daily.) 90 tablet 1   aspirin EC 81 MG tablet Take 81 mg by mouth daily. Swallow whole.     atorvastatin (LIPITOR) 80 MG tablet TAKE 1 Tablet BY MOUTH ONCE EVERY DAY AT 6PM (Patient taking differently: Take 80 mg by mouth daily.) 90 tablet 1   cloNIDine (CATAPRES) 0.2 MG tablet TAKE 1 Tablet  BY MOUTH TWICE DAILY (Patient taking differently: Take 0.2 mg by mouth 2 (two) times daily.) 180 tablet 1   cyclobenzaprine (FLEXERIL)  10 MG tablet Take 1 tablet (10 mg total) by mouth 2 (two) times daily as needed for muscle spasms. 20 tablet 0   diclofenac (VOLTAREN) 75 MG EC tablet Take 1 tablet (75 mg total) by mouth 2 (two) times daily as needed. (Take with food) (Patient taking differently: Take 75 mg by mouth 2 (two) times daily as needed for mild pain. (Take with food)) 60 tablet 0   furosemide (LASIX) 20 MG tablet TAKE 1 Tablet BY MOUTH ONCE EVERY DAY (Patient taking differently: Take 20 mg by mouth daily.) 90 tablet 1   lisinopril (ZESTRIL) 40 MG tablet TAKE 1 Tablet BY MOUTH ONCE EVERY DAY (Patient taking differently: Take 40 mg by mouth daily.) 90 tablet 1    metoprolol tartrate (LOPRESSOR) 100 MG tablet TAKE 2 Tablets BY MOUTH TWICE DAILY (Patient taking differently: Take 200 mg by mouth 2 (two) times daily.) 180 tablet 1   nitroGLYCERIN (NITROSTAT) 0.4 MG SL tablet PLACE 1 TABLET UNDER TONGUE AS NEEDED FOR CHEST PAIN EVERY 5 MINUTES X 3 MAX DOSES.  CALL 911 IF PAIN PERSISTS (Patient taking differently: Place 0.4 mg under the tongue every 5 (five) minutes as needed for chest pain.) 25 tablet PRN   ondansetron (ZOFRAN-ODT) 4 MG disintegrating tablet Take 1 tablet (4 mg total) by mouth every 8 (eight) hours as needed for nausea or vomiting. 20 tablet 0   oxyCODONE-acetaminophen (PERCOCET/ROXICET) 5-325 MG tablet Take 1 tablet by mouth every 4 (four) hours as needed for severe pain.     gabapentin (NEURONTIN) 300 MG capsule Take 1 capsule (300 mg total) by mouth 3 (three) times daily as needed. (Patient taking differently: Take 300 mg by mouth 3 (three) times daily as needed (leg pain).) 90 capsule 0   No current facility-administered medications for this visit.   Allergies:  Penicillins   ROS: No palpitations or syncope.  Physical Exam: VS:  BP 132/80   Pulse (!) 56   Ht 5\' 2"  (1.575 m)   Wt 198 lb (89.8 kg)   LMP 07/18/2011   SpO2 96%   BMI 36.21 kg/m , BMI Body mass index is 36.21 kg/m.  Wt Readings from Last 3 Encounters:  07/22/22 198 lb (89.8 kg)  04/16/22 203 lb (92.1 kg)  03/07/22 205 lb (93 kg)    General: Patient appears comfortable at rest. HEENT: Conjunctiva and lids normal. Neck: Supple, no elevated JVP, lateral carotid bruits. Lungs: Clear to auscultation, nonlabored breathing at rest. Cardiac: Regular rate and rhythm, no S3, 2/6 systolic murmur. Extremities: No pitting edema.  ECG:  An ECG dated 02/11/2022 was personally reviewed today and demonstrated:  Sinus rhythm with Q in lead III, low voltage.  Recent Labwork: 03/07/2022: ALT 15; AST 18; BUN 29; Creatinine, Ser 1.17; Hemoglobin 15.2; Platelets 402; Potassium 3.5;  Sodium 137     Component Value Date/Time   CHOL 218 (H) 12/24/2021 1210   TRIG 192 (H) 12/24/2021 1210   HDL 34 (L) 12/24/2021 1210   CHOLHDL 6.4 12/24/2021 1210   VLDL 38 12/24/2021 1210   LDLCALC 146 (H) 12/24/2021 1210    Other Studies Reviewed Today:  Echocardiogram 02/11/2022:  1. Left ventricular ejection fraction, by estimation, is 60 to 65%. The  left ventricle has normal function. The left ventricle has no regional  wall motion abnormalities. There is moderate left ventricular hypertrophy.  Left ventricular diastolic  parameters are consistent with Grade I diastolic dysfunction (impaired  relaxation). Elevated left atrial pressure.   2. Right ventricular systolic  function is normal. The right ventricular  size is normal. There is normal pulmonary artery systolic pressure.   3. Left atrial size was mild to moderately dilated.   4. The mitral valve is normal in structure. No evidence of mitral valve  regurgitation. No evidence of mitral stenosis.   5. The tricuspid valve is abnormal.   6. The aortic valve is tricuspid. Aortic valve regurgitation is not  visualized. No aortic stenosis is present.   7. The inferior vena cava is normal in size with greater than 50%  respiratory variability, suggesting right atrial pressure of 3 mmHg.   8. Agitated saline contrast bubble study was negative, with no evidence  of any interatrial shunt.   CT abdomen and pelvis 03/07/2022: IMPRESSION: There is wall thickening in the transverse and descending colon suggesting active inflammatory or infectious colitis. There is no loculated pericolic fluid collection.   There is no evidence of intestinal obstruction or pneumoperitoneum. There is no hydronephrosis. Appendix is not dilated.   There is 4.5 cm fusiform aneurysm in the infrarenal abdominal aorta with interval increase in size.   Recommend follow-up every 6 months and vascular consultation.Reference: [facility]  [phone].   Diverticulosis of colon without signs of diverticulitis. There is 4.1 cm cyst in the left adnexa. There is 2.8 cm cyst in the right adnexa. Findings may suggest dominant follicle and functional cyst. Follow-up pelvic sonogram should be considered to assess resolution.  Assessment and Plan:  1.  Multivessel CAD including moderate LAD stenosis extending into diagonal and chronic occlusion of the RCA.  Plan to continue medical therapy in the absence of increasing angina.  Decrease aspirin to 81 mg daily, continue Norvasc, lisinopril, Lopressor, and Lipitor.  2.  Mixed hyperlipidemia, reports compliance with Lipitor 80 mg daily.  Recheck FLP.  3.  PAD followed by Dr. Scot Dock.  4.  Essential hypertension on multimodal therapy.  Blood pressure control is reasonable today.  No changes were made.  Medication Adjustments/Labs and Tests Ordered: Current medicines are reviewed at length with the patient today.  Concerns regarding medicines are outlined above.   Tests Ordered: Orders Placed This Encounter  Procedures   Lipid panel   EKG 12-Lead    Medication Changes: No orders of the defined types were placed in this encounter.   Disposition:  Follow up  6 months.  Signed, Satira Sark, MD, [facility] 07/22/2022 8:58 AM    [facility] [place], [place], [place] [address] Phone: [phone]; Fax: [phone]

## 2022-07-22 ENCOUNTER — Encounter: Payer: Self-pay | Admitting: Cardiology

## 2022-07-22 ENCOUNTER — Ambulatory Visit: Payer: Medicaid Other | Attending: Cardiology | Admitting: Cardiology

## 2022-07-22 VITALS — BP 132/80 | HR 56 | Ht 62.0 in | Wt 198.0 lb

## 2022-07-22 DIAGNOSIS — E782 Mixed hyperlipidemia: Secondary | ICD-10-CM

## 2022-07-22 DIAGNOSIS — I25119 Atherosclerotic heart disease of native coronary artery with unspecified angina pectoris: Secondary | ICD-10-CM | POA: Diagnosis not present

## 2022-07-22 DIAGNOSIS — I10 Essential (primary) hypertension: Secondary | ICD-10-CM

## 2022-07-22 DIAGNOSIS — I739 Peripheral vascular disease, unspecified: Secondary | ICD-10-CM

## 2022-07-22 NOTE — Patient Instructions (Signed)
Medication Instructions:  Your physician has recommended you make the following change in your medication:  Start aspirin 81 mg once a day Continue all other medications as directed  Labwork: Fasting Lipid (2 weeks at [facility])  Testing/Procedures: none  Follow-Up:  Your physician recommends that you schedule a follow-up appointment in: 6 months  Any Other Special Instructions Will Be Listed Below (If Applicable).  If you need a refill on your cardiac medications before your next appointment, please call your pharmacy.

## 2022-08-08 DIAGNOSIS — J209 Acute bronchitis, unspecified: Secondary | ICD-10-CM | POA: Diagnosis not present

## 2022-08-21 IMAGING — DX DG KNEE COMPLETE 4+V*R*
4 series · 4 of 4 positions shown · non-contrast
Comparison: None Available.

CLINICAL DATA: Knee pain.

EXAM:
RIGHT KNEE - COMPLETE 4+ VIEW

[knee ap]
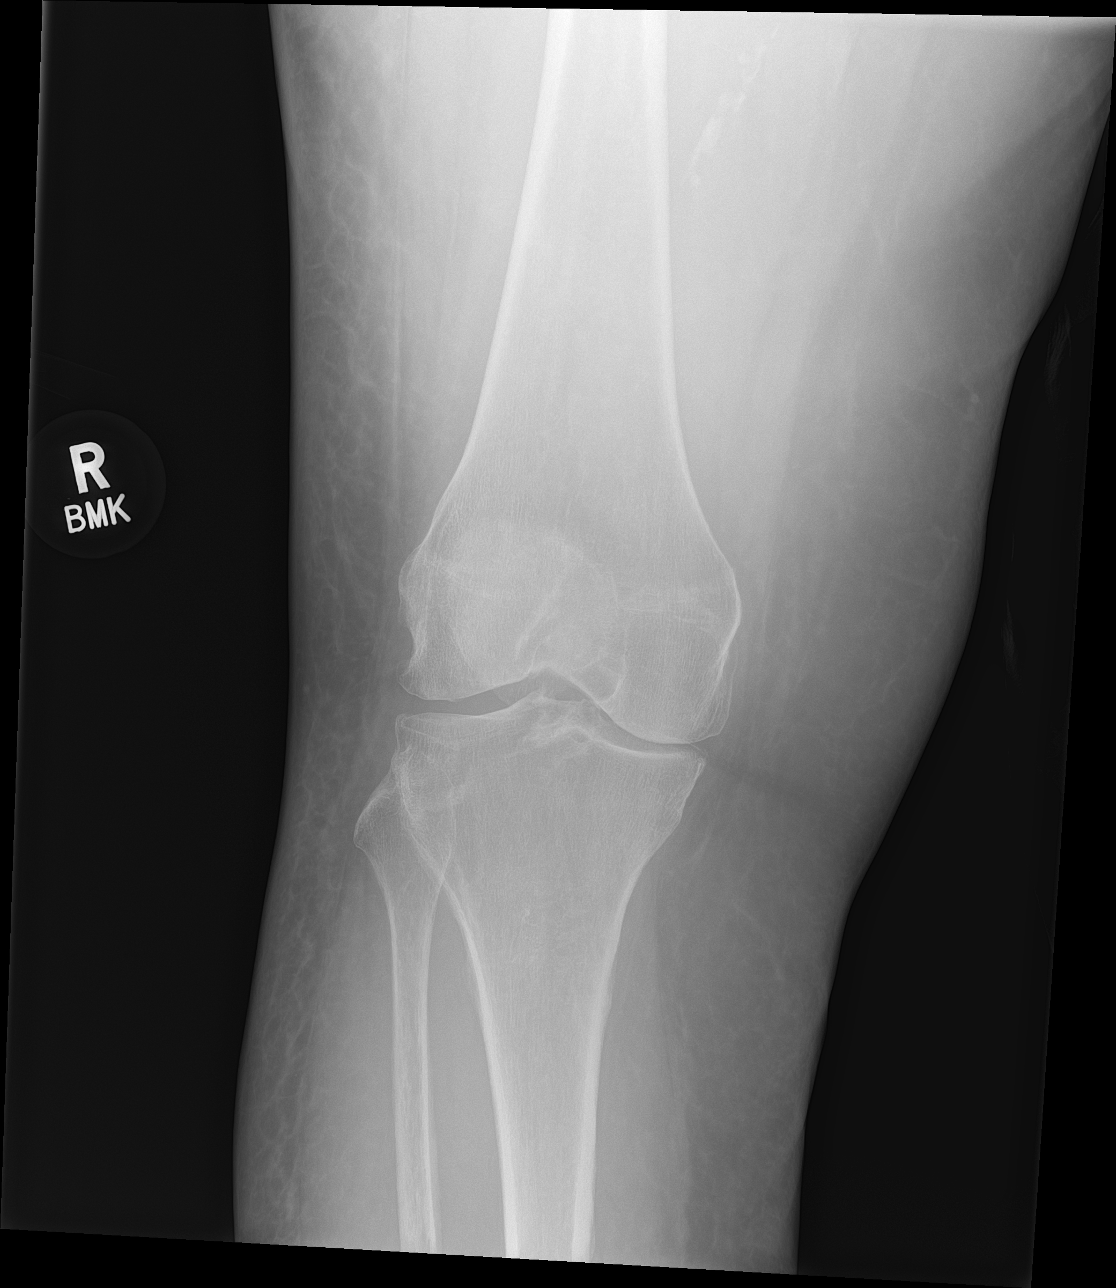

[knee obl (1 of 2)]
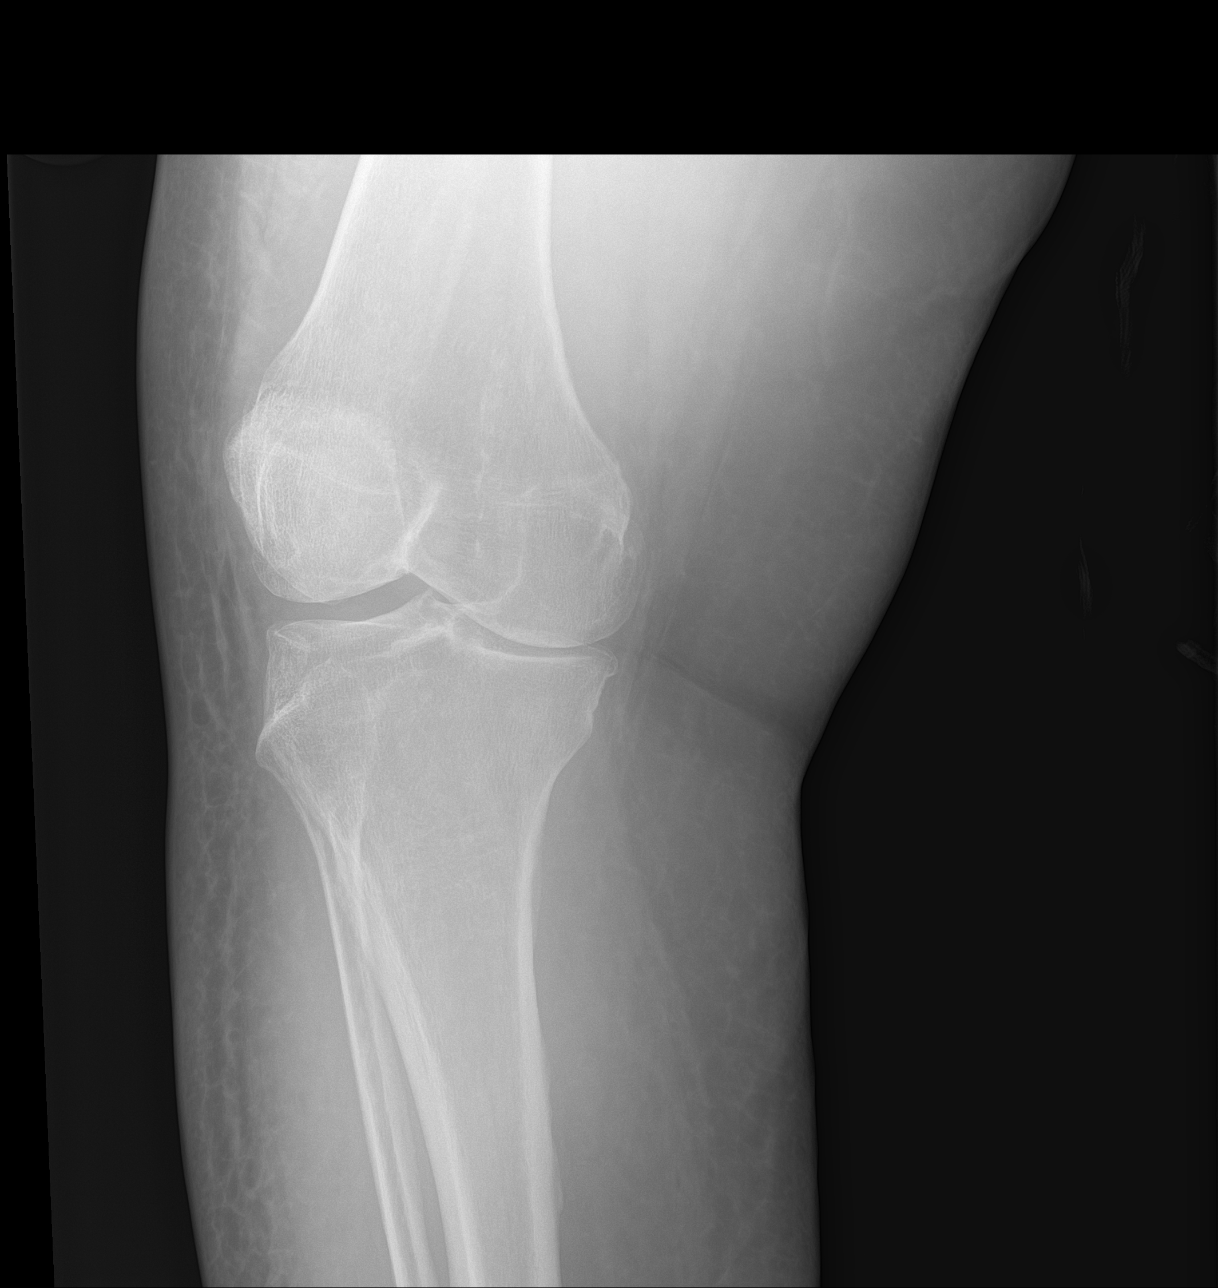

[knee obl (2 of 2)]
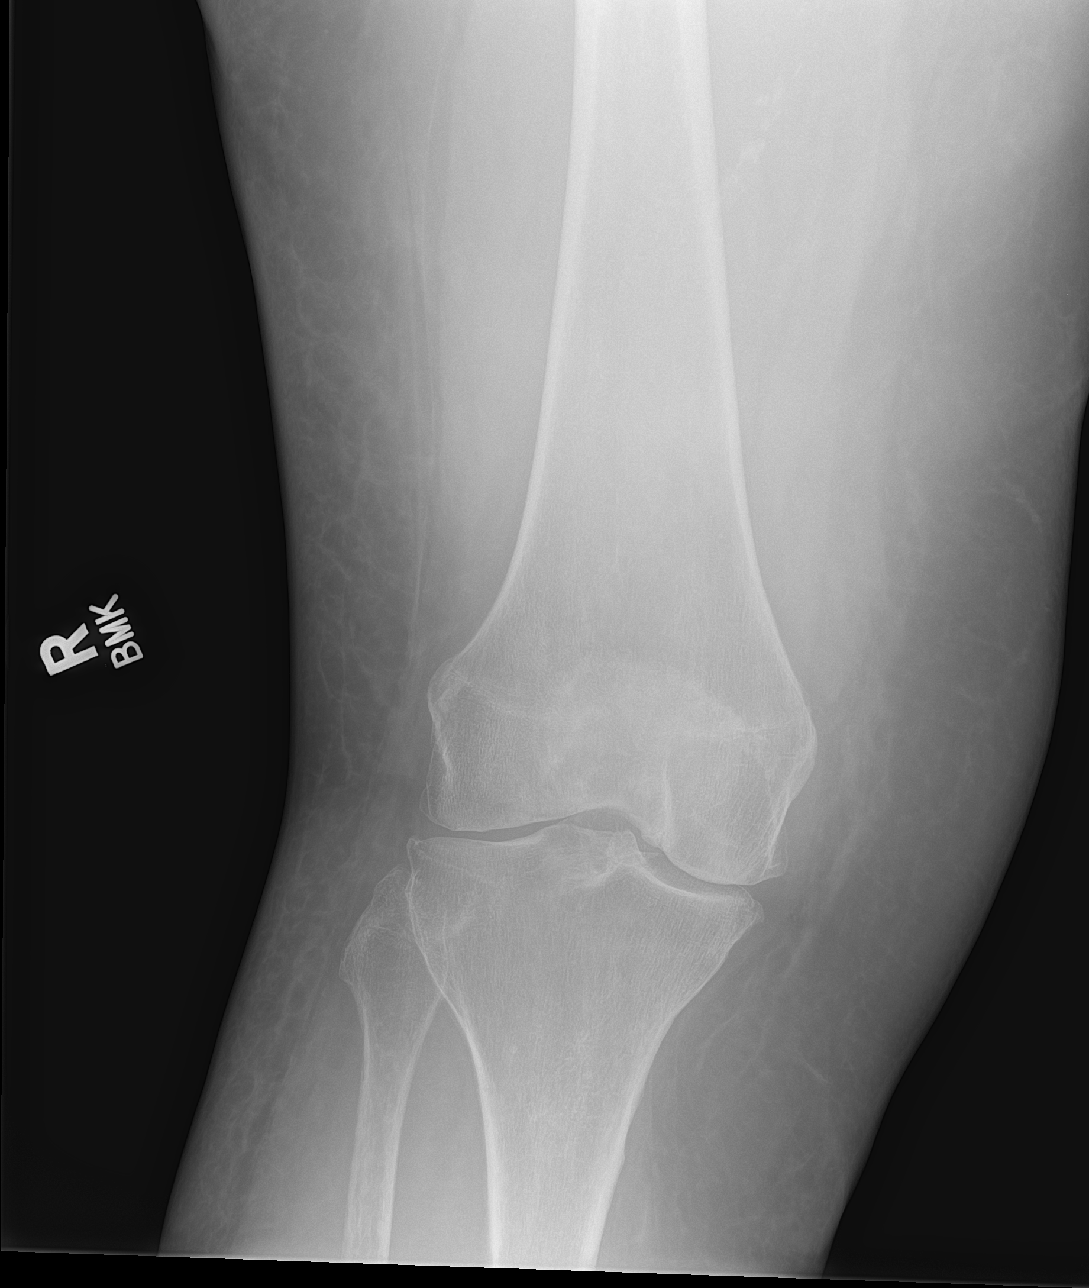

[knee lat]
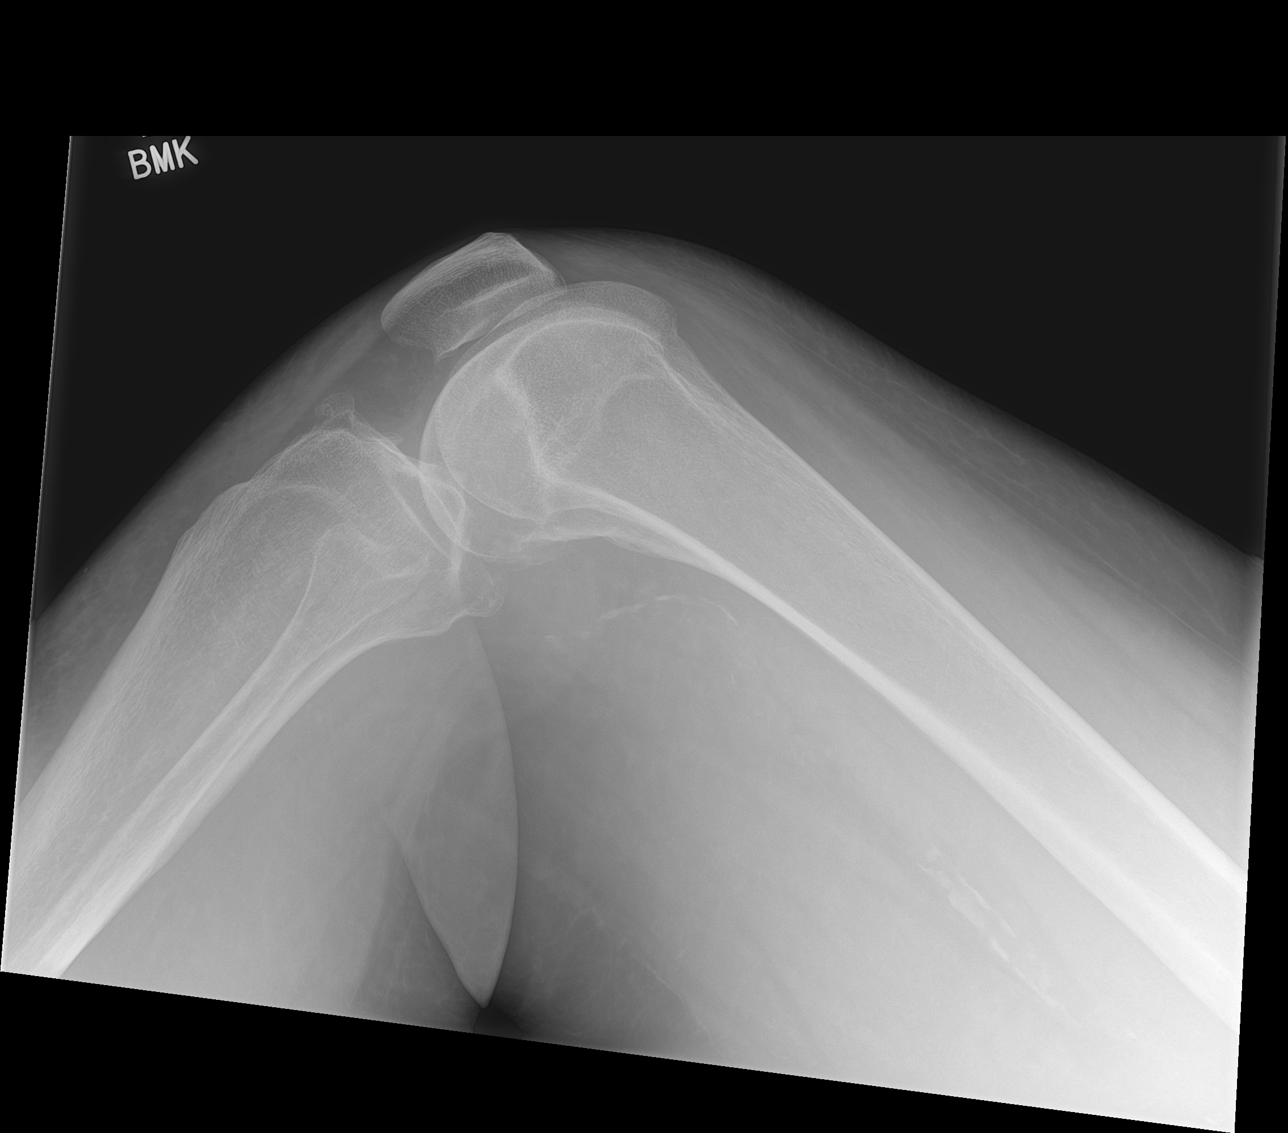

[4 of 4 positions shown; findings below may reference images not displayed]

FINDINGS: No evidence of fracture, dislocation, or joint effusion.
Tricompartmental joint space narrowing prominent in the medial
tibiofemoral and patellofemoral compartments with marginal
osteophytes.
IMPRESSION: Moderate tricompartmental knee osteoarthritis.

## 2022-08-29 IMAGING — CT CT ABD-PELV W/ CM
2 of 5 series · 15 of 46 positions shown, 17 images · IV contrast (Omnipaque or Isovue)
Comparison: 08/25/2016

CLINICAL DATA: Abdominal pain, nausea, vomiting

EXAM:
CT ABDOMEN AND PELVIS WITH CONTRAST
TECHNIQUE: Multidetector CT imaging of the abdomen and pelvis was performed
using the standard protocol following bolus administration of
intravenous contrast.

[Series 2: axial st · axial · 0.98mm/px · z∈[+676,+1116]mm · 12 of 100 slices shown, 14 images]
[im 6/100  soft-tissue]
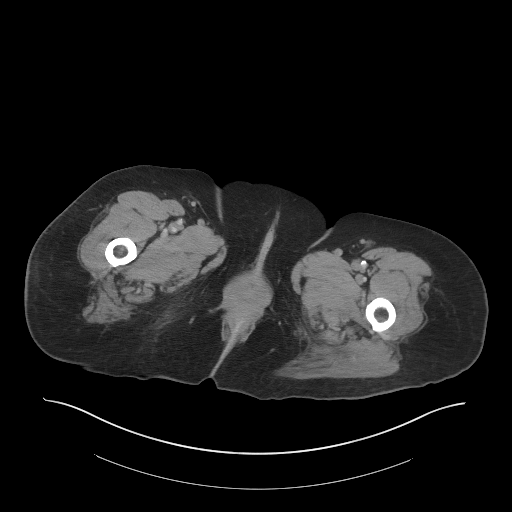
[im 6/100  bone]
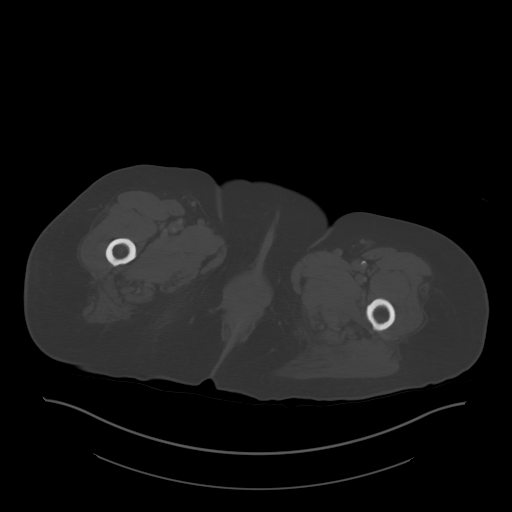
[im 17/100  soft-tissue]
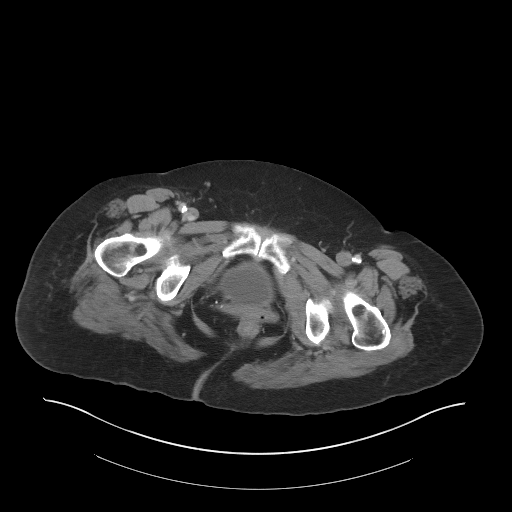
[im 23/100  soft-tissue]
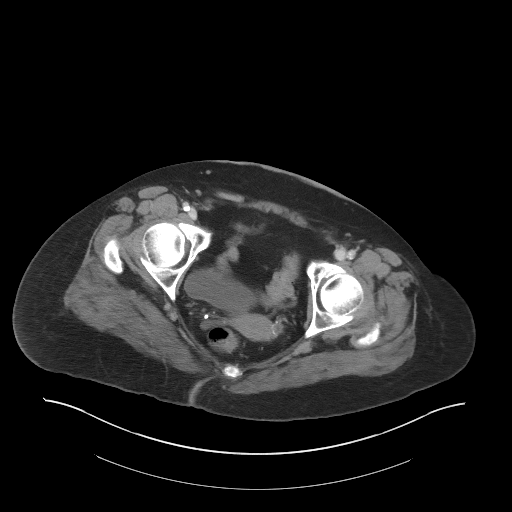
[im 28/100  soft-tissue]
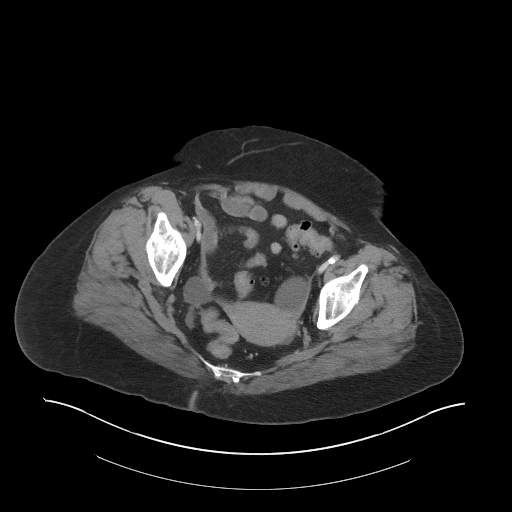
[im 39/100  soft-tissue]
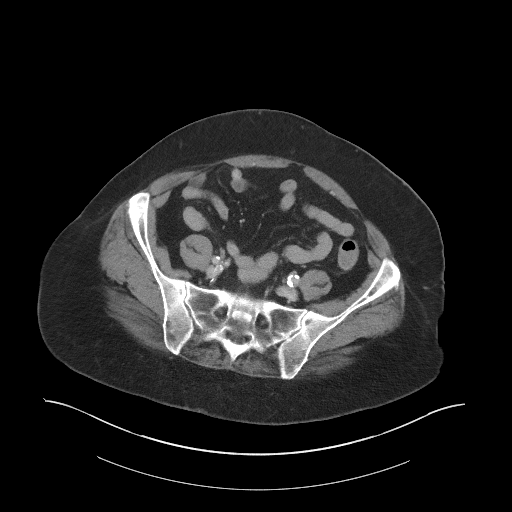
[im 45/100  soft-tissue]
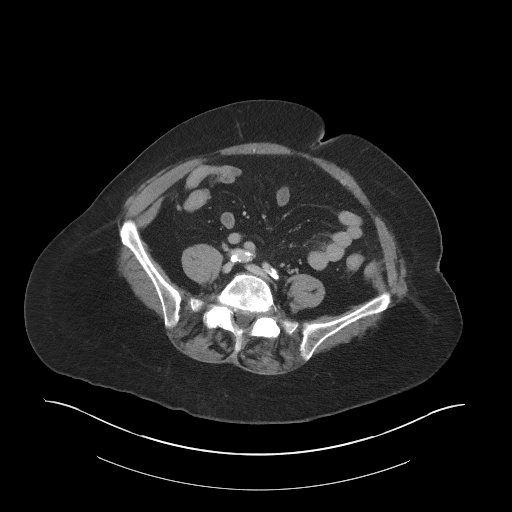
[im 56/100  soft-tissue]
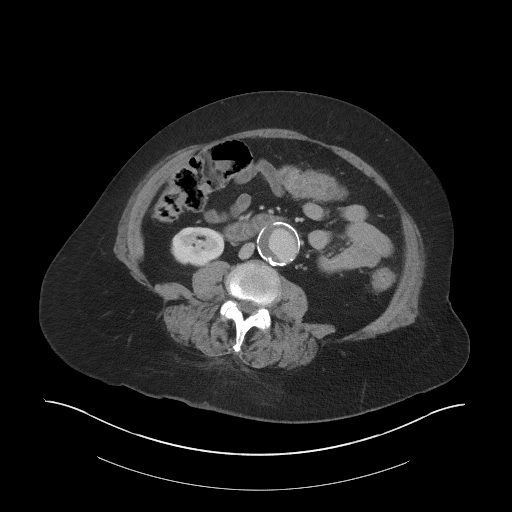
[im 61/100  soft-tissue]
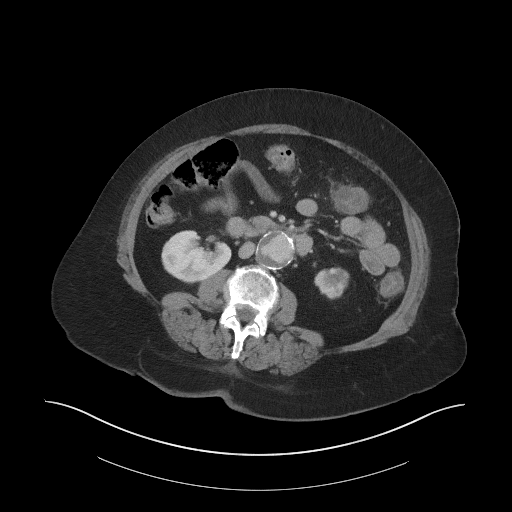
[im 72/100  soft-tissue]
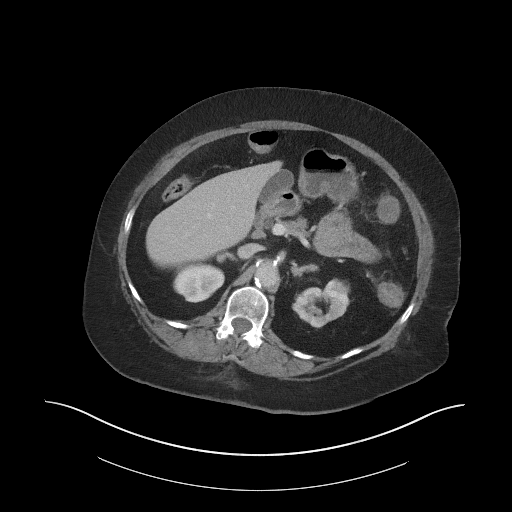
[im 72/100  bone]
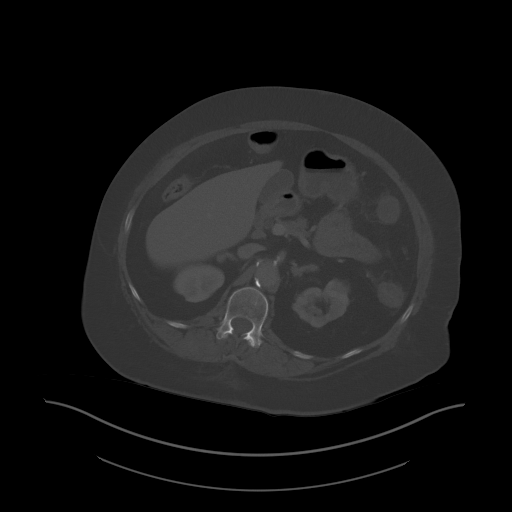
[im 78/100  soft-tissue]
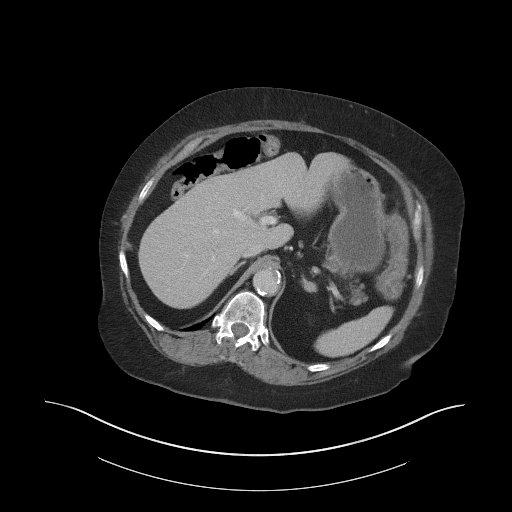
[im 83/100  soft-tissue]
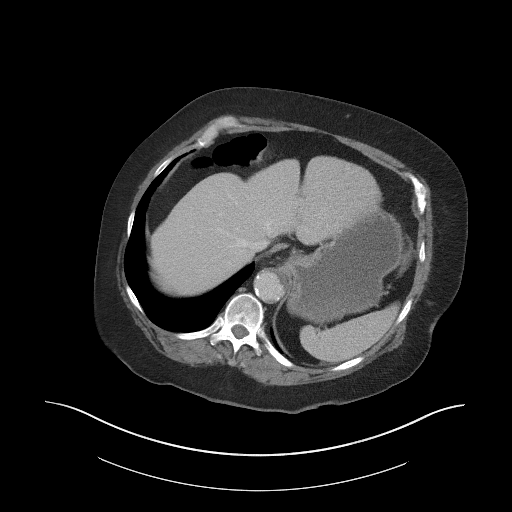
[im 94/100  soft-tissue]
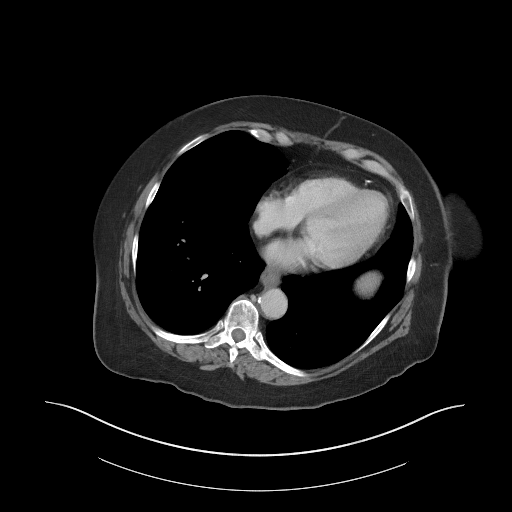

[Series 5: coronal st · coronal · 0.89mm/px · 3 of 125 slices shown]
[im 42/125  soft-tissue]
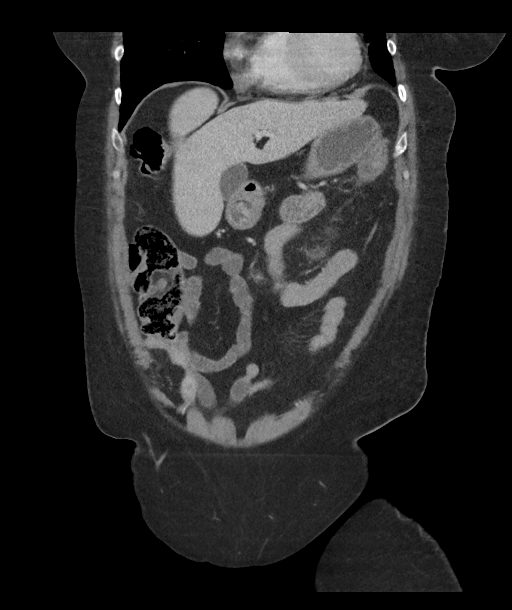
[im 56/125  soft-tissue]
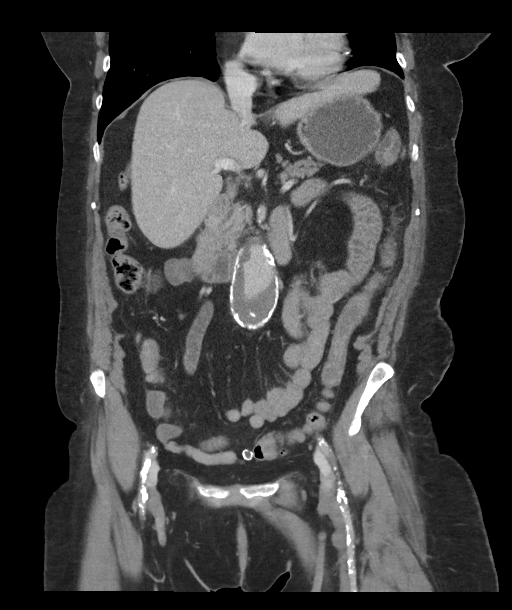
[im 69/125  soft-tissue]
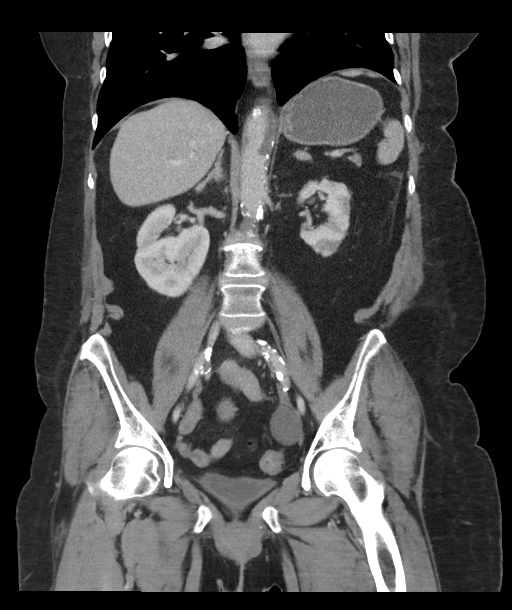

[15 of 46 positions shown; findings below may reference images not displayed]

RADIATION DOSE REDUCTION: This exam was performed according to the
departmental dose-optimization program which includes automated
exposure control, adjustment of the mA and/or kV according to
patient size and/or use of iterative reconstruction technique.

CONTRAST:  100mL OMNIPAQUE IOHEXOL 300 MG/ML  SOLN
FINDINGS: Lower chest: Visualized lower lung fields are unremarkable. There
are scattered coronary artery calcifications. Left hemidiaphragm is
elevated.

Hepatobiliary: No focal abnormality is seen in the liver. There is
no dilation of bile ducts. Gallbladder is unremarkable.

Pancreas: No focal abnormality is seen.

Spleen: Unremarkable.

Adrenals/Urinary Tract: Adrenals are unremarkable. There is no
hydronephrosis. There are few scattered calcifications in the renal
artery branches. There is a 1 mm calcification in the lower pole of
left kidney possibly suggesting tiny nonobstructing punctate renal
stone. there is 2.4 cm cyst in the upper pole of left kidney. There
are foci of cortical thinning in the kidneys, more so in the left
kidney suggesting possible scarring from chronic pyelonephritis.
Urinary bladder is not distended. Lower portion of bladder is
projecting below the level of pubic symphysis, possibly suggesting
cystocele.

Stomach/Bowel: There is moderate distention of stomach with fluid in
the lumen. Small bowel loops are not dilated. Appendix is not
dilated. There is wall thickening in the transverse colon and
descending colon. There is mild pericolic stranding in the
transverse and descending colon. Multiple diverticula are seen in
colon without signs of focal diverticulitis.

Vascular/Lymphatic: There is fusiform aneurysmal dilation of
infrarenal abdominal aorta measuring 4.5 cm in maximum diameter.
There is interval increase in size. Part of the lumen of the
aneurysm is filled with thrombus. Extensive calcifications are seen
in the aorta and its major branches. Possible high-grade stenosis is
noted in the common iliac arteries.

Reproductive: There is 4.1 cm smooth marginated fluid density lesion
in the left adnexa. There is 2.8 cm fluid density structure in the
right adnexa. There are no internal septations or mural nodules.

Other: There is no pneumoperitoneum. Minimal amount of pelvic
ascites is seen. This may be related to colitis or suggest recent
rupture of ovarian cyst or follicle.

Musculoskeletal: There is minimal retrolisthesis at L2-L3 level and
minimal anterolisthesis at L4-L5 level. There is encroachment of
neural foramina at multiple levels.
IMPRESSION: There is wall thickening in the transverse and descending colon
suggesting active inflammatory or infectious colitis. There is no
loculated pericolic fluid collection.

There is no evidence of intestinal obstruction or pneumoperitoneum.
There is no hydronephrosis. Appendix is not dilated.

There is 4.5 cm fusiform aneurysm in the infrarenal abdominal aorta
with interval increase in size.

Recommend follow-up every 6 months and vascular

Diverticulosis of colon without signs of diverticulitis. There is
4.1 cm cyst in the left adnexa. There is 2.8 cm cyst in the right
adnexa. Findings may suggest dominant follicle and functional cyst.
Follow-up pelvic sonogram should be considered to assess resolution.

Other findings as described in the body of the report.

## 2022-09-30 DIAGNOSIS — M48062 Spinal stenosis, lumbar region with neurogenic claudication: Secondary | ICD-10-CM | POA: Diagnosis not present

## 2022-10-08 ENCOUNTER — Other Ambulatory Visit: Payer: Self-pay | Admitting: *Deleted

## 2022-10-08 MED ORDER — NITROGLYCERIN 0.4 MG SL SUBL: SUBLINGUAL_TABLET | SUBLINGUAL | 3 refills | Status: DC

## 2022-10-15 ENCOUNTER — Ambulatory Visit (HOSPITAL_COMMUNITY): Payer: Medicaid Other

## 2022-10-15 ENCOUNTER — Ambulatory Visit: Payer: Medicaid Other | Admitting: Vascular Surgery

## 2022-10-19 DIAGNOSIS — M48062 Spinal stenosis, lumbar region with neurogenic claudication: Secondary | ICD-10-CM | POA: Diagnosis not present

## 2022-11-06 ENCOUNTER — Telehealth: Payer: Self-pay | Admitting: Cardiology

## 2022-11-06 MED ORDER — LISINOPRIL 40 MG PO TABS: 40.0000 mg | ORAL_TABLET | Freq: Every day | ORAL | 1 refills | Status: DC

## 2022-11-06 MED ORDER — METOPROLOL TARTRATE 100 MG PO TABS: 200.0000 mg | ORAL_TABLET | Freq: Two times a day (BID) | ORAL | 1 refills | Status: DC

## 2022-11-06 MED ORDER — AMLODIPINE BESYLATE 10 MG PO TABS: 10.0000 mg | ORAL_TABLET | Freq: Every day | ORAL | 1 refills | Status: DC

## 2022-11-06 MED ORDER — CLONIDINE HCL 0.2 MG PO TABS: 0.2000 mg | ORAL_TABLET | Freq: Two times a day (BID) | ORAL | 1 refills | Status: DC

## 2022-11-06 NOTE — Telephone Encounter (Signed)
*  STAT* If patient is at the pharmacy, call can be transferred to refill team.   1. Which medications need to be refilled? (please list name of each medication and dose if known)  amLODipine (NORVASC) 10 MG tablet   2. Which pharmacy/location (including street and city if local pharmacy) is medication to be sent to? [place], [place]    3. Do they need a 30 day or 90 day supply? 90 day

## 2022-11-06 NOTE — Telephone Encounter (Signed)
Advised that all 3 requested rx's would be sent to [facility].

## 2022-11-06 NOTE — Telephone Encounter (Signed)
Pt c/o medication issue:  1. Name of Medication: metoprolol tartrate (LOPRESSOR) 100 MG tablet lisinopril (ZESTRIL) 40 MG tablet cloNIDine (CATAPRES) 0.2 MG tablet  2. How are you currently taking this medication (dosage and times per day)? As prescribed   3. Are you having a reaction (difficulty breathing--STAT)?   4. What is your medication issue? Patient is requesting return call to see if these medications can be called in by Dr. Domenic Polite, since the doctor who was prescribing them is too far for her to travel to. She states she would like this continued with Dr. Domenic Polite.

## 2022-11-12 ENCOUNTER — Encounter: Payer: Self-pay | Admitting: Vascular Surgery

## 2022-11-12 ENCOUNTER — Ambulatory Visit (INDEPENDENT_AMBULATORY_CARE_PROVIDER_SITE_OTHER)
Admission: RE | Admit: 2022-11-12 | Discharge: 2022-11-12 | Disposition: A | Discharge status: Home / Self Care | Payer: Medicaid Other | Source: Ambulatory Visit | Attending: Vascular Surgery | Admitting: Vascular Surgery

## 2022-11-12 ENCOUNTER — Ambulatory Visit (HOSPITAL_COMMUNITY)
Admission: RE | Admit: 2022-11-12 | Discharge: 2022-11-12 | Disposition: A | Discharge status: Home / Self Care | Payer: Medicaid Other | Source: Ambulatory Visit | Attending: Vascular Surgery | Admitting: Vascular Surgery

## 2022-11-12 ENCOUNTER — Ambulatory Visit: Payer: Medicaid Other | Admitting: Vascular Surgery

## 2022-11-12 VITALS — BP 137/83 | HR 65 | Temp 97.9°F | Resp 20 | Ht 62.0 in | Wt 200.0 lb

## 2022-11-12 DIAGNOSIS — I7143 Infrarenal abdominal aortic aneurysm, without rupture: Secondary | ICD-10-CM | POA: Diagnosis present

## 2022-11-12 DIAGNOSIS — I739 Peripheral vascular disease, unspecified: Secondary | ICD-10-CM | POA: Diagnosis present

## 2022-11-12 DIAGNOSIS — I6523 Occlusion and stenosis of bilateral carotid arteries: Secondary | ICD-10-CM | POA: Diagnosis present

## 2022-11-12 LAB — VAS US ABI WITH/WO TBI
Left ABI: 0.76
Right ABI: 0.86

## 2022-11-12 NOTE — Progress Notes (Signed)
REASON FOR VISIT:   Follow-up of multiple vascular issues  MEDICAL ISSUES:   ABDOMINAL AORTIC ANEURYSM: The patient's abdominal aortic aneurysm has not changed in size since June of last year.  It is 4.5 cm in maximum diameter.  This has been relatively stable in size for some time.  She understands we would not consider elective repair unless it reached 5.5 cm in maximum diameter.  We have again discussed the importance of tobacco cessation.  I have explained that continued tobacco use does increase her risk of aneurysm expansion.  Her blood pressure is under good control.  I have ordered a follow-up duplex of her abdominal aortic aneurysm in 9 months.  BILATERAL CAROTID DISEASE: The patient has undergone previous right carotid endarterectomy and most recently a redo right carotid endarterectomy in 2019.  This is widely patent.  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  She remains asymptomatic.  I ordered a follow-up duplex scan in 9 months.  After that we can go to a 1 year interval.  PERIPHERAL ARTERIAL DISEASE: The patient has stable infrainguinal arterial occlusive disease.  I encouraged her to stay as active as possible.  We have discussed the importance of tobacco cessation.  She has a slightly diminished left femoral pulse.  We would only consider arteriography unless she developed disabling claudication, rest pain, or nonhealing ulcer.  I have explained that I will be retiring in September of this year and she felt strongly about seeing me 1 more time before this.  HPI:   [name] is a pleasant 62 y.o. female who I last saw on 04/16/2022.  I been following her with multiple issues.  She has a known 4.7 cm infrarenal abdominal aortic aneurysm.  She had a CT scan on 03/07/2022 which showed this had not changed in size.  I set her up for 59-monthfollow-up visit.  In addition she has a known left common carotid artery occlusion.  We have been following her  carotid disease.  She is on aspirin and is on a statin.  She also has no peripheral arterial disease.  Since I saw her last, she denies any abdominal pain.  She has been having some back pain with radiation of the pain down her legs and is being worked up for degenerative disc disease of her back.  She has had 2 injections in her back.  She does describe some mild calf claudication bilaterally.  She denies any rest pain or nonhealing ulcers.  Since I saw her last, she denies any history of stroke, TIAs, expressive or receptive aphasia, or amaurosis fugax.  She continues to smoke about half a pack per day.  She is on aspirin and is on a statin.  Past Medical History:  Diagnosis Date   AAA (abdominal aortic aneurysm) without rupture (HCC) 08/25/2016   Anxiety    Arthritis    Bipolar 1 disorder (HCC)    CAD (coronary artery disease)    a. 08/08/16 LAD 70%, 60% 1st diag, CTO RCA Med Rx EF 55%   Carotid artery stenosis    Right carotid endarterectomy 07/22/05 with recurrent stenosis 01/2017; known left ICA occlusion   COPD (chronic obstructive pulmonary disease) ([facility]    Crack cocaine use    Depression    Headache    Hypertension    Myocardial infarction ([facility]    Right Groin Hematoma    a. 07/2016 following diagnostic cath   Stroke ([facility] 2017  Family History  Problem Relation Age of Onset   Heart attack Mother    Stroke Mother    Hypertension Mother    Diabetes Mother    Heart disease Mother    Heart attack Father    Hypertension Father    Heart disease Father    Cancer Maternal Aunt    Cancer Maternal Uncle    COPD Paternal Uncle        2 pat uncles with COPD   Cancer Paternal Uncle        1 pat uncle with cancer    SOCIAL HISTORY: Social History   Tobacco Use   Smoking status: Every Day    Packs/day: 0.50    Years: 42.00    Total pack years: 21.00    Types: Cigarettes    Start date: 10/05/1974   Smokeless tobacco: Never  Substance Use Topics   Alcohol use: No     Allergies  Allergen Reactions   Penicillins Hives and Swelling    PATIENT HAS HAD A PCN REACTION WITH IMMEDIATE RASH, FACIAL/TONGUE/THROAT SWELLING, SOB, OR LIGHTHEADEDNESS WITH HYPOTENSION:  #  #  YES  #  # HAS PT DEVELOPED SEVERE RASH INVOLVING MUCUS MEMBRANES or SKIN NECROSIS: #  #  YES  #  # Has patient had a PCN reaction that required hospitalization No Has patient had a PCN reaction occurring within the last 10 years: Non If all of the above answers are "NO", then may proceed with Cephalosporin use.     Current Outpatient Medications  Medication Sig Dispense Refill   albuterol (VENTOLIN HFA) 108 (90 Base) MCG/ACT inhaler INHALE 2 PUFFS BY MOUTH EVERY 6 HOURS AS NEEDED FOR COUGHING, WHEEZING, OR SHORTNESS OF BREATH (Patient taking differently: Inhale 1-2 puffs into the lungs every 6 (six) hours as needed for wheezing or shortness of breath.) 20.1 g 1   Alirocumab 75 MG/ML SOAJ Inject 75 mg into the skin every 14 (fourteen) days. 1 mL 3   amLODipine (NORVASC) 10 MG tablet Take 1 tablet (10 mg total) by mouth daily. 90 tablet 1   aspirin EC 81 MG tablet Take 81 mg by mouth daily. Swallow whole.     atorvastatin (LIPITOR) 80 MG tablet TAKE 1 Tablet BY MOUTH ONCE EVERY DAY AT 6PM (Patient taking differently: Take 80 mg by mouth daily.) 90 tablet 1   cloNIDine (CATAPRES) 0.2 MG tablet Take 1 tablet (0.2 mg total) by mouth 2 (two) times daily. 180 tablet 1   diclofenac (VOLTAREN) 75 MG EC tablet Take 1 tablet (75 mg total) by mouth 2 (two) times daily as needed. (Take with food) (Patient taking differently: Take 75 mg by mouth 2 (two) times daily as needed for mild pain. (Take with food)) 60 tablet 0   furosemide (LASIX) 20 MG tablet TAKE 1 Tablet BY MOUTH ONCE EVERY DAY (Patient taking differently: Take 20 mg by mouth daily.) 90 tablet 1   lisinopril (ZESTRIL) 40 MG tablet Take 1 tablet (40 mg total) by mouth daily. 90 tablet 1   metoprolol tartrate (LOPRESSOR) 100 MG tablet Take 2  tablets (200 mg total) by mouth 2 (two) times daily. 360 tablet 1   nitroGLYCERIN (NITROSTAT) 0.4 MG SL tablet PLACE 1 TABLET UNDER TONGUE AS NEEDED FOR CHEST PAIN EVERY 5 MINUTES X 3 MAX DOSES.  CALL 911 IF PAIN PERSISTS 25 tablet 3   cyclobenzaprine (FLEXERIL) 10 MG tablet Take 1 tablet (10 mg total) by mouth 2 (two) times daily as needed for  muscle spasms. (Patient not taking: Reported on 11/12/2022) 20 tablet 0   gabapentin (NEURONTIN) 300 MG capsule Take 1 capsule (300 mg total) by mouth 3 (three) times daily as needed. (Patient taking differently: Take 300 mg by mouth 3 (three) times daily as needed (leg pain).) 90 capsule 0   ondansetron (ZOFRAN-ODT) 4 MG disintegrating tablet Take 1 tablet (4 mg total) by mouth every 8 (eight) hours as needed for nausea or vomiting. (Patient not taking: Reported on 11/12/2022) 20 tablet 0   oxyCODONE-acetaminophen (PERCOCET/ROXICET) 5-325 MG tablet Take 1 tablet by mouth every 4 (four) hours as needed for severe pain. (Patient not taking: Reported on 11/12/2022)     No current facility-administered medications for this visit.    REVIEW OF SYSTEMS:  [X]$  denotes positive finding, [ ]$  denotes negative finding Cardiac  Comments:  Chest pain or chest pressure:    Shortness of breath upon exertion:    Short of breath when lying flat:    Irregular heart rhythm:        Vascular    Pain in calf, thigh, or hip brought on by ambulation: x   Pain in feet at night that wakes you up from your sleep:     Blood clot in your veins:    Leg swelling:         Pulmonary    Oxygen at home:    Productive cough:     Wheezing:         Neurologic    Sudden weakness in arms or legs:     Sudden numbness in arms or legs:     Sudden onset of difficulty speaking or slurred speech:    Temporary loss of vision in one eye:     Problems with dizziness:         Gastrointestinal    Blood in stool:     Vomited blood:         Genitourinary    Burning when urinating:      Blood in urine:        Psychiatric    Major depression:         Hematologic    Bleeding problems:    Problems with blood clotting too easily:        Skin    Rashes or ulcers:        Constitutional    Fever or chills:     PHYSICAL EXAM:   Vitals:   11/12/22 1054 11/12/22 1058  BP: (!) 165/93 137/83  Pulse: 65   Resp: 20   Temp: 97.9 F (36.6 C)   SpO2: 95%   Weight: 200 lb (90.7 kg)   Height: 5' 2"$  (1.575 m)     GENERAL: The patient is a well-nourished female, in no acute distress. The vital signs are documented above. CARDIAC: There is a regular rate and rhythm.  VASCULAR: I do not detect carotid bruits. She has a palpable right femoral pulse with a slightly diminished left femoral pulse. I cannot palpate pedal pulses.  Both feet are warm and well-perfused.  PULMONARY: There is good air exchange bilaterally without wheezing or rales. ABDOMEN: Soft and non-tender with normal pitched bowel sounds.  MUSCULOSKELETAL: There are no major deformities or cyanosis. NEUROLOGIC: No focal weakness or paresthesias are detected. SKIN: There are no ulcers or rashes noted. PSYCHIATRIC: The patient has a normal affect.  DATA:    DUPLEX ABDOMINAL AORTIC ANEURYSM: I have independently interpreted her duplex of the aorta.  On  today's study the maximum diameter of her infrarenal aorta is 4.5 cm which has not changed significantly compared to the CT scan on 03/07/2022.  The right common iliac artery measures 1.1 cm in maximum diameter.  The left common iliac artery measures 1.1 cm in maximum diameter.  ARTERIAL DOPPLER STUDY: I have independently interpreted her arterial Doppler study today.  On the right side there is a biphasic posterior tibial and dorsalis pedis signal.  ABIs 86%.  Toe pressures 111 mmHg.  On the left side there is a biphasic posterior tibial and dorsalis pedis signal.  ABI 76%.  Toe pressures 86 mmHg.  CAROTID DUPLEX: I have independently interpreted her carotid  duplex scan today.  On the right side, the right carotid endarterectomy site is widely patent.  Right vertebral artery is patent with antegrade flow.  There is no evidence of right subclavian artery stenosis.  On the left side the patient has a known common carotid artery occlusion.  The ICA fills via collaterals from the external carotid artery.  The left vertebral artery has retrograde flow.  There is monophasic flow in the left subclavian artery suggesting a possible proximal subclavian stenosis.    Deitra Mayo [facility] [phone]

## 2022-11-13 ENCOUNTER — Other Ambulatory Visit: Payer: Self-pay

## 2022-11-13 DIAGNOSIS — I739 Peripheral vascular disease, unspecified: Secondary | ICD-10-CM

## 2022-11-13 DIAGNOSIS — I7143 Infrarenal abdominal aortic aneurysm, without rupture: Secondary | ICD-10-CM

## 2022-11-13 DIAGNOSIS — I6523 Occlusion and stenosis of bilateral carotid arteries: Secondary | ICD-10-CM

## 2022-12-09 ENCOUNTER — Other Ambulatory Visit: Payer: Self-pay

## 2022-12-09 ENCOUNTER — Emergency Department (HOSPITAL_COMMUNITY)
Admission: EM | Admit: 2022-12-09 | Discharge: 2022-12-09 | Disposition: A | Discharge status: Home / Self Care | Payer: Medicaid Other | Attending: Emergency Medicine | Admitting: Emergency Medicine

## 2022-12-09 ENCOUNTER — Encounter (HOSPITAL_COMMUNITY): Payer: Self-pay

## 2022-12-09 ENCOUNTER — Emergency Department (HOSPITAL_COMMUNITY): Payer: Medicaid Other

## 2022-12-09 DIAGNOSIS — M25562 Pain in left knee: Secondary | ICD-10-CM

## 2022-12-09 DIAGNOSIS — I2510 Atherosclerotic heart disease of native coronary artery without angina pectoris: Secondary | ICD-10-CM | POA: Insufficient documentation

## 2022-12-09 DIAGNOSIS — W19XXXA Unspecified fall, initial encounter: Secondary | ICD-10-CM | POA: Insufficient documentation

## 2022-12-09 DIAGNOSIS — Z7982 Long term (current) use of aspirin: Secondary | ICD-10-CM | POA: Diagnosis not present

## 2022-12-09 DIAGNOSIS — I10 Essential (primary) hypertension: Secondary | ICD-10-CM | POA: Insufficient documentation

## 2022-12-09 DIAGNOSIS — Z79899 Other long term (current) drug therapy: Secondary | ICD-10-CM | POA: Insufficient documentation

## 2022-12-09 DIAGNOSIS — M1712 Unilateral primary osteoarthritis, left knee: Secondary | ICD-10-CM

## 2022-12-09 MED ORDER — OXYCODONE-ACETAMINOPHEN 5-325 MG PO TABS: 1.0000 | ORAL_TABLET | Freq: Four times a day (QID) | ORAL | 0 refills | Status: DC | PRN

## 2022-12-09 MED ORDER — OXYCODONE HCL 5 MG PO TABS
5.0000 mg | ORAL_TABLET | Freq: Once | ORAL | Status: AC
  Administered 2022-12-09: 5 mg via ORAL
  Filled 2022-12-09: qty 1

## 2022-12-09 NOTE — ED Notes (Signed)
See triage notes. Swelling noted. No warmth noted. Rom limited due to pain.

## 2022-12-09 NOTE — ED Provider Notes (Signed)
[facility] Provider Note   CSN: [id number] Arrival date & time: 12/09/22  1214     History  Chief Complaint  Patient presents with   Leg Injury    [name] is a 62 y.o. female with a history including hypertension, anxiety, CAD, history of CVA, AAA which is currently being closely monitored and bilateral knee osteoarthritis with severe arthritis in her left knee presenting for evaluation of worsened pain in his left knee since she fell a week ago.  She reports the knee occasionally collapses on her secondary to this arthritis pain and this happened a week ago, she fell forward and hit her face on the floor.  She denies any problems with facial pain or swelling, but has had persistent left knee pain and swelling since this event.  She is having difficulty straightening the knee completely without significant pain and pressure, ambulation is also difficult as well she has a walker which she uses at home.  She has found no alleviators for her pain symptoms.  She takes Tylenol which has not been helpful.  The history is provided by the patient.       Home Medications Prior to Admission medications   Medication Sig Start Date End Date Taking? Authorizing Provider  oxyCODONE-acetaminophen (PERCOCET/ROXICET) 5-325 MG tablet Take 1 tablet by mouth every 6 (six) hours as needed. 12/09/22  Yes Chicquita Mendel, Almyra Free, PA-C  albuterol (VENTOLIN HFA) 108 (90 Base) MCG/ACT inhaler INHALE 2 PUFFS BY MOUTH EVERY 6 HOURS AS NEEDED FOR COUGHING, WHEEZING, OR SHORTNESS OF BREATH Patient taking differently: Inhale 1-2 puffs into the lungs every 6 (six) hours as needed for wheezing or shortness of breath. 11/27/21   Soyla Dryer, PA-C  Alirocumab 75 MG/ML SOAJ Inject 75 mg into the skin every 14 (fourteen) days. 01/05/22   Soyla Dryer, PA-C  amLODipine (NORVASC) 10 MG tablet Take 1 tablet (10 mg total) by mouth daily. 11/06/22   Satira Sark, MD  aspirin  EC 81 MG tablet Take 81 mg by mouth daily. Swallow whole.    [provider]  atorvastatin (LIPITOR) 80 MG tablet TAKE 1 Tablet BY MOUTH ONCE EVERY DAY AT 6PM Patient taking differently: Take 80 mg by mouth daily. 11/27/21   Soyla Dryer, PA-C  cloNIDine (CATAPRES) 0.2 MG tablet Take 1 tablet (0.2 mg total) by mouth 2 (two) times daily. 11/06/22   Satira Sark, MD  cyclobenzaprine (FLEXERIL) 10 MG tablet Take 1 tablet (10 mg total) by mouth 2 (two) times daily as needed for muscle spasms. Patient not taking: Reported on 11/12/2022 02/27/22   Marcello Fennel, PA-C  diclofenac (VOLTAREN) 75 MG EC tablet Take 1 tablet (75 mg total) by mouth 2 (two) times daily as needed. (Take with food) Patient taking differently: Take 75 mg by mouth 2 (two) times daily as needed for mild pain. (Take with food) 09/02/21   Soyla Dryer, PA-C  furosemide (LASIX) 20 MG tablet TAKE 1 Tablet BY MOUTH ONCE EVERY DAY Patient taking differently: Take 20 mg by mouth daily. 11/27/21   Soyla Dryer, PA-C  gabapentin (NEURONTIN) 300 MG capsule Take 1 capsule (300 mg total) by mouth 3 (three) times daily as needed. Patient taking differently: Take 300 mg by mouth 3 (three) times daily as needed (leg pain). 02/11/22 03/13/22  Wyvonnia Dusky, MD  lisinopril (ZESTRIL) 40 MG tablet Take 1 tablet (40 mg total) by mouth daily. 11/06/22   Satira Sark, MD  metoprolol tartrate (LOPRESSOR) 100 MG tablet Take 2 tablets (200 mg total) by mouth 2 (two) times daily. 11/06/22   Satira Sark, MD  nitroGLYCERIN (NITROSTAT) 0.4 MG SL tablet PLACE 1 TABLET UNDER TONGUE AS NEEDED FOR CHEST PAIN EVERY 5 MINUTES X 3 MAX DOSES.  CALL 911 IF PAIN PERSISTS 10/08/22   Satira Sark, MD  ondansetron (ZOFRAN-ODT) 4 MG disintegrating tablet Take 1 tablet (4 mg total) by mouth every 8 (eight) hours as needed for nausea or vomiting. Patient not taking: Reported on 11/12/2022 03/07/22   Fransico Meadow, PA-C      Allergies     Penicillins    Review of Systems   Review of Systems  Constitutional:  Negative for fever.  Musculoskeletal:  Positive for arthralgias and joint swelling. Negative for myalgias.  Neurological:  Negative for weakness and numbness.  All other systems reviewed and are negative.   Physical Exam Updated Vital Signs BP 103/85 (BP Location: Right Arm)   Pulse (!) 55   Temp 97.9 F (36.6 C) (Oral)   Resp 19   Ht '5\' 3"'$  (1.6 m)   Wt 90.7 kg   LMP 07/18/2011   SpO2 99%   BMI 35.43 kg/m  Physical Exam Constitutional:      Appearance: She is well-developed.  HENT:     Head: Atraumatic.  Cardiovascular:     Comments: Pulses equal bilaterally Musculoskeletal:        General: Swelling and tenderness present.     Cervical back: Normal range of motion.     Left knee: Swelling present. No deformity, effusion or ecchymosis. Decreased range of motion. Normal pulse.     Instability Tests: Anterior drawer test negative. Posterior drawer test negative.     Comments: Patient can flex the left knee to 90 degree position without discomfort.  Full extension causes significant pain along with the pulling/pressure sensation in her popliteal space.  No crepitus appreciated with range of motion.No knee joint ligament instability.  She does have some pain with varus and valgus strain.  Skin:    General: Skin is warm and dry.  Neurological:     Mental Status: She is alert.     Sensory: No sensory deficit.     Motor: No weakness.     Deep Tendon Reflexes: Reflexes normal.     ED Results / Procedures / Treatments   Labs (all labs ordered are listed, but only abnormal results are displayed) Labs Reviewed - No data to display  EKG None  Radiology DG Knee Complete 4 Views Left  Result Date: 12/09/2022 CLINICAL DATA:  Fall EXAM: LEFT KNEE - COMPLETE 4+ VIEW COMPARISON:  Left knee radiograph 02/11/2022 FINDINGS: There is no evidence of acute fracture. There is tricompartment osteophyte formation  with severe medial compartment joint space narrowing, slightly progressed since May 2023. Small joint effusion. Vascular calcifications. IMPRESSION: No evidence of acute fracture.  Small joint effusion. Tricompartment osteoarthritis of the left knee, severe in the medial compartment, slightly progressed since May 2023. Electronically Signed   By: Maurine Simmering M.D.   On: 12/09/2022 13:32    Procedures Procedures    Medications Ordered in ED Medications  oxyCODONE (Oxy IR/ROXICODONE) immediate release tablet 5 mg (5 mg Oral Given 12/09/22 1333)    ED Course/ Medical Decision Making/ A&P                             Medical Decision  Making Patient with acute on chronic left knee pain after a fall which occurred 1 week ago.  She already has known degenerative changes in this knee joint.  She has no sign of ligament tear or joint instability on her exam, there is no effusion in the knee joint space, however with discomfort with attempts at full extension I do wonder about a meniscal injury.  Her x-ray does show severe medial degenerative joint disease.  Amount and/or Complexity of Data Reviewed Radiology: ordered.    Details: Reviewed and discussed with patient, results above, I agree with interpretation.  Risk Prescription drug management. Diagnosis or treatment significantly limited by social determinants of health. Risk Details: Patient was placed in a knee brace for comfort, referral to orthopedics for further evaluation.  We discussed possible treatments as she raises concern about being a poor surgical candidate with her other medical conditions.  She may benefit from steroid injections or other nonsurgical treatments for this advanced arthritis.  She was given a small quantity of oxycodone, she was also encouraged to continue using home NSAIDs and discussed other home treatments including ice, elevation.           Final Clinical Impression(s) / ED Diagnoses Final diagnoses:   Acute pain of left knee  Primary osteoarthritis of left knee    Rx / DC Orders ED Discharge Orders          Ordered    oxyCODONE-acetaminophen (PERCOCET/ROXICET) 5-325 MG tablet  Every 6 hours PRN        12/09/22 1450              Evalee Jefferson, PA-C 12/09/22 1841    Isla Pence, MD 12/14/22 [phone]

## 2022-12-09 NOTE — ED Triage Notes (Signed)
Pt fell from a standing position and hit face/head on wall 1 week ago. Now c/o L leg pain with knee swelling. Unable to straighten leg completely, difficulty putting pressure and ambulating.   Denies LOC, no thinners besides aspirin.   Pt states she has chronic leg pain prior to fall.

## 2022-12-09 NOTE — Discharge Instructions (Signed)
I do recommend taking an anti-inflammatory if you do not have any more diclofenac as listed on your medicine list I recommend taking ibuprofen 600 mg 3 times daily.  You have also been prescribed a pain medication, do not drive within 4 hours of taking this as it will make you drowsy.  I do recommend ice to your knee is much as possible to help with pain and swelling.  Wear the knee brace for comfort.

## 2022-12-15 ENCOUNTER — Telehealth: Payer: Self-pay | Admitting: *Deleted

## 2022-12-15 NOTE — Transitions of Care (Post Inpatient/ED Visit) (Signed)
   12/15/2022  Name: [name] MRN: [id number] DOB: 08-Nov-1960  Today's TOC FU Call Status: Today's TOC FU Call Status:: Unsuccessul Call (1st Attempt) Unsuccessful Call (1st Attempt) Date: 12/15/22  Attempted to reach the patient regarding the most recent Inpatient/ED visit.  Follow Up Plan: Additional outreach attempts will be made to reach the patient to complete the Transitions of Care (Post Inpatient/ED visit) call.   [place] [facility] [phone]

## 2022-12-17 ENCOUNTER — Telehealth: Payer: Self-pay | Admitting: *Deleted

## 2022-12-17 NOTE — Transitions of Care (Post Inpatient/ED Visit) (Signed)
   12/17/2022  Name: [name] MRN: [id number] DOB: [birth year]  Today's TOC FU Call Status: Today's TOC FU Call Status:: Successful TOC FU Call Competed TOC FU Call Complete Date: 12/17/22  Transition Care Management Follow-up Telephone Call Date of Discharge: 12/10/22 Dis[facility] Reason for ED Visit: Other: (acute knee pain) How have you been since you were released from the hospital?: Worse (I fell again and my back is hurting some.) Any questions or concerns?: No  Items Reviewed: Did you receive and understand the discharge instructions provided?: Yes Medications obtained and verified?: Yes (Medications Reviewed) Any new allergies since your discharge?: No Dietary orders reviewed?: No Do you have support at home?: Yes People in Home: child(ren), dependent Name of Support/Comfort Primary Source: [facility]- daughters  Home Care and Equipment/Supplies: [place] Ordered?: No Any new equipment or medical supplies ordered?: No  Functional Questionnaire: Do you need assistance with bathing/showering or dressing?: No Do you need assistance with meal preparation?: Yes Do you need assistance with eating?: No Do you have difficulty maintaining continence: No Do you need assistance with getting out of bed/getting out of a chair/moving?: No Do you have difficulty managing or taking your medications?: No  Follow up appointments reviewed: PCP Follow-up appointment confirmed?: Yes Date of PCP follow-up appointment?: 12/23/22 Follow-up Provider: [id number] [facility] Follow-up appointment confirmed?: Yes Date of Specialist follow-up appointment?: 12/29/22 Follow-Up Specialty Provider:: Larena Glassman ortho surgery 1:30 Do you need transportation to your follow-up appointment?: No Do you understand care options if your condition(s) worsen?: Yes-patient verbalized understanding  SDOH Interventions Today    Flowsheet Row  Most Recent Value  SDOH Interventions   Food Insecurity Interventions Intervention Not Indicated  Transportation Interventions Intervention Not Indicated      Interventions Today    Flowsheet Row Most Recent Value  General Interventions   General Interventions Discussed/Reviewed General Interventions Discussed, General Interventions Reviewed, Doctor Visits  Doctor Visits Discussed/Reviewed Doctor Visits Discussed, Doctor Visits Reviewed, Specialist  [facility] discussed all appointments date and time to be there. This is a new PCP]  PCP/Specialist Visits Compliance with follow-up visit         [place] [facility] [phone]

## 2022-12-23 ENCOUNTER — Ambulatory Visit: Payer: Medicaid Other | Admitting: Family Medicine

## 2022-12-29 ENCOUNTER — Encounter: Payer: Self-pay | Admitting: Orthopedic Surgery

## 2022-12-29 ENCOUNTER — Ambulatory Visit: Payer: Medicaid Other | Admitting: Orthopedic Surgery

## 2022-12-29 VITALS — BP 150/80 | HR 60

## 2022-12-29 DIAGNOSIS — M1712 Unilateral primary osteoarthritis, left knee: Secondary | ICD-10-CM | POA: Diagnosis not present

## 2022-12-29 DIAGNOSIS — M1710 Unilateral primary osteoarthritis, unspecified knee: Secondary | ICD-10-CM

## 2022-12-29 NOTE — Patient Instructions (Signed)

## 2022-12-29 NOTE — Progress Notes (Unsigned)
New Patient Visit  Assessment: [name] is a 62 y.o. female with the following: 1. Arthritis of knee ***   Plan: Ileene Musa    Procedure note injection Left knee joint   Verbal consent was obtained to inject the left knee joint  Timeout was completed to confirm the site of injection.  The skin was prepped with alcohol and ethyl chloride was sprayed at the injection site.  A 21-gauge needle was used to inject 40 mg of Depo-Medrol and 1% lidocaine (3 cc) into the left knee using an anterolateral approach.  There were no complications. A sterile bandage was applied.     Follow-up: Return if symptoms worsen or fail to improve.  Subjective:  Chief Complaint  Patient presents with   Knee Pain    Lt knee pain after fall approx 11/29/22    History of Present Illness: [name] is a 62 y.o. female who {Presentation:27320} for evaluation of    Review of Systems: No fevers or chills*** No numbness or tingling No chest pain No shortness of breath No bowel or bladder dysfunction No GI distress No headaches   Medical History:  Past Medical History:  Diagnosis Date   AAA (abdominal aortic aneurysm) without rupture 08/25/2016   Anxiety    Arthritis    Bipolar 1 disorder    CAD (coronary artery disease)    a. 08/08/16 LAD 70%, 60% 1st diag, CTO RCA Med Rx EF 55%   Carotid artery stenosis    Right carotid endarterectomy 07/22/05 with recurrent stenosis 01/2017; known left ICA occlusion   COPD (chronic obstructive pulmonary disease)    Crack cocaine use    Depression    Headache    Hypertension    Myocardial infarction    Right Groin Hematoma    a. 07/2016 following diagnostic cath   Stroke 2017    Past Surgical History:  Procedure Laterality Date   CARDIAC CATHETERIZATION N/A 08/07/2016   Procedure: Left Heart Cath and Coronary Angiography;  Surgeon: Troy Sine, MD;  Location: [facility];  Service: Cardiovascular;  Laterality: N/A;    CYST REMOVAL TRUNK Left    beneath L breast   ENDARTERECTOMY Right 02/08/2018   Procedure: ENDARTERECTOMY CAROTID REDO;  Surgeon: Angelia Mould, MD;  Location: [facility];  Service: Vascular;  Laterality: Right;   TUBAL LIGATION     VASCULAR SURGERY Right    right carotid endarterectomy 07/22/05     Family History  Problem Relation Age of Onset   Heart attack Mother    Stroke Mother    Hypertension Mother    Diabetes Mother    Heart disease Mother    Heart attack Father    Hypertension Father    Heart disease Father    Cancer Maternal Aunt    Cancer Maternal Uncle    COPD Paternal Uncle        2 pat uncles with COPD   Cancer Paternal Uncle        1 pat uncle with cancer   Social History   Tobacco Use   Smoking status: Every Day    Packs/day: 0.50    Years: 42.00    Additional pack years: 0.00    Total pack years: 21.00    Types: Cigarettes    Start date: 10/05/1974   Smokeless tobacco: Never  Vaping Use   Vaping Use: Never used  Substance Use Topics   Alcohol use: No   Drug use: Yes  Frequency: 2.0 times per week    Types: Marijuana, "Crack" cocaine    Comment: marijauna . reports no more crack since Feb 08, 2018    Allergies  Allergen Reactions   Penicillins Hives and Swelling    PATIENT HAS HAD A PCN REACTION WITH IMMEDIATE RASH, FACIAL/TONGUE/THROAT SWELLING, SOB, OR LIGHTHEADEDNESS WITH HYPOTENSION:  #  #  YES  #  # HAS PT DEVELOPED SEVERE RASH INVOLVING MUCUS MEMBRANES or SKIN NECROSIS: #  #  YES  #  # Has patient had a PCN reaction that required hospitalization No Has patient had a PCN reaction occurring within the last 10 years: Non If all of the above answers are "NO", then may proceed with Cephalosporin use.     Current Meds  Medication Sig   albuterol (VENTOLIN HFA) 108 (90 Base) MCG/ACT inhaler INHALE 2 PUFFS BY MOUTH EVERY 6 HOURS AS NEEDED FOR COUGHING, WHEEZING, OR SHORTNESS OF BREATH (Patient taking differently: Inhale 1-2 puffs into  the lungs every 6 (six) hours as needed for wheezing or shortness of breath.)   amLODipine (NORVASC) 10 MG tablet Take 1 tablet (10 mg total) by mouth daily.   aspirin EC 81 MG tablet Take 81 mg by mouth daily. Swallow whole.   atorvastatin (LIPITOR) 80 MG tablet TAKE 1 Tablet BY MOUTH ONCE EVERY DAY AT 6PM (Patient taking differently: Take 80 mg by mouth daily.)   cloNIDine (CATAPRES) 0.2 MG tablet Take 1 tablet (0.2 mg total) by mouth 2 (two) times daily.   diclofenac (VOLTAREN) 75 MG EC tablet Take 1 tablet (75 mg total) by mouth 2 (two) times daily as needed. (Take with food) (Patient taking differently: Take 75 mg by mouth 2 (two) times daily as needed for mild pain. (Take with food))   furosemide (LASIX) 20 MG tablet TAKE 1 Tablet BY MOUTH ONCE EVERY DAY (Patient taking differently: Take 20 mg by mouth daily.)   lisinopril (ZESTRIL) 40 MG tablet Take 1 tablet (40 mg total) by mouth daily.   metoprolol tartrate (LOPRESSOR) 100 MG tablet Take 2 tablets (200 mg total) by mouth 2 (two) times daily.   nitroGLYCERIN (NITROSTAT) 0.4 MG SL tablet PLACE 1 TABLET UNDER TONGUE AS NEEDED FOR CHEST PAIN EVERY 5 MINUTES X 3 MAX DOSES.  CALL 911 IF PAIN PERSISTS   oxyCODONE-acetaminophen (PERCOCET/ROXICET) 5-325 MG tablet Take 1 tablet by mouth every 6 (six) hours as needed.    Objective: BP (!) 150/80   Pulse 60   LMP 07/18/2011   Physical Exam:  General: {General PE Findings:25791} Gait: {Gait:25792}    IMAGING: {XR Reviewed:24899}   New Medications:  No orders of the defined types were placed in this encounter.     Mordecai Rasmussen, MD  12/29/2022 1:48 PM

## 2022-12-30 ENCOUNTER — Encounter: Payer: Self-pay | Admitting: Orthopedic Surgery

## 2023-02-10 ENCOUNTER — Ambulatory Visit: Payer: Medicaid Other | Attending: Cardiology | Admitting: Cardiology

## 2023-02-10 ENCOUNTER — Encounter: Payer: Self-pay | Admitting: Cardiology

## 2023-02-10 VITALS — BP 142/88 | HR 63 | Ht 63.0 in | Wt 205.4 lb

## 2023-02-10 DIAGNOSIS — Z79899 Other long term (current) drug therapy: Secondary | ICD-10-CM

## 2023-02-10 DIAGNOSIS — I25119 Atherosclerotic heart disease of native coronary artery with unspecified angina pectoris: Secondary | ICD-10-CM

## 2023-02-10 DIAGNOSIS — I10 Essential (primary) hypertension: Secondary | ICD-10-CM | POA: Diagnosis not present

## 2023-02-10 DIAGNOSIS — E782 Mixed hyperlipidemia: Secondary | ICD-10-CM | POA: Diagnosis not present

## 2023-02-10 NOTE — Progress Notes (Signed)
Cardiology Office Note  Date: 02/10/2023   ID: [name], DOB 04/11/1961, MRN [id number]  History of Present Illness: [name] is a 62 y.o. female last seen in October 2023.  She is here for a follow-up visit.  She reports no progressive angina, used a single nitroglycerin in the last 6 months.  Stable NYHA class II dyspnea, no palpitations or syncope.  She has chronic pain and indicates knee and lumbar arthritis.  I reviewed her medications.  She reports compliance with therapy, has not had interval lab work since last year.  Currently on Lipitor 80 mg daily.  She continues to follow with [facility], I reviewed the office note from February.  Physical Exam: VS:  BP (!) 142/88   Pulse 63   Ht 5\' 3"  (1.6 m)   Wt 205 lb 6.4 oz (93.2 kg)   LMP 07/18/2011   SpO2 94%   BMI 36.38 kg/m , BMI Body mass index is 36.38 kg/m.  Wt Readings from Last 3 Encounters:  02/10/23 205 lb 6.4 oz (93.2 kg)  12/09/22 200 lb (90.7 kg)  11/12/22 200 lb (90.7 kg)    General: Patient appears comfortable at rest. HEENT: Conjunctiva and lids normal. Neck: Supple, no elevated JVP, bilateral carotid bruits as before. Lungs: Decreased breath sounds with scattered rhonchi, no wheezing or labored breathing. Cardiac: Regular rate and rhythm, no S3, 2/6 systolic murmur. Extremities: No pitting edema.  ECG:  An ECG dated 07/22/2022 was personally reviewed today and demonstrated:  Sinus bradycardia with old inferior infarct pattern, rule out old anterior infarct pattern, low voltage.  Labwork: 03/07/2022: ALT 15; AST 18; BUN 29; Creatinine, Ser 1.17; Hemoglobin 15.2; Platelets 402; Potassium 3.5; Sodium 137     Component Value Date/Time   CHOL 218 (H) 12/24/2021 1210   TRIG 192 (H) 12/24/2021 1210   HDL 34 (L) 12/24/2021 1210   CHOLHDL 6.4 12/24/2021 1210   VLDL 38 12/24/2021 1210   LDLCALC 146 (H) 12/24/2021 1210   Other Studies Reviewed Today:  Echocardiogram 02/11/2022:  1. Left ventricular  ejection fraction, by estimation, is 60 to 65%. The  left ventricle has normal function. The left ventricle has no regional  wall motion abnormalities. There is moderate left ventricular hypertrophy.  Left ventricular diastolic  parameters are consistent with Grade I diastolic dysfunction (impaired  relaxation). Elevated left atrial pressure.   2. Right ventricular systolic function is normal. The right ventricular  size is normal. There is normal pulmonary artery systolic pressure.   3. Left atrial size was mild to moderately dilated.   4. The mitral valve is normal in structure. No evidence of mitral valve  regurgitation. No evidence of mitral stenosis.   5. The tricuspid valve is abnormal.   6. The aortic valve is tricuspid. Aortic valve regurgitation is not  visualized. No aortic stenosis is present.   7. The inferior vena cava is normal in size with greater than 50%  respiratory variability, suggesting right atrial pressure of 3 mmHg.   8. Agitated saline contrast bubble study was negative, with no evidence  of any interatrial shunt.   Assessment and Plan:  1.  CAD with moderate LAD disease extending into the diagonal system and chronic total occlusion of the RCA managed medically as of 2017 angiography.  LVEF 60 to 65% by echocardiogram in May 2023.  She reports no progressive angina.  Plan to continue medical therapy and observation.  Currently on aspirin, Norvasc, Lipitor, lisinopril, Lopressor, and as  needed nitroglycerin.  2.  Mixed hyperlipidemia, on Lipitor 80 mg daily.  Check FLP.  3.  Essential hypertension.  Continue with current medications.  Check BMET.  4.  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  She continues to follow with Dr. Edilia Bo, reviewed the note from February.  Disposition:   Follow up  6 months.  Signed, Jonelle Sidle, M.D., F.A.C.C. [facility]

## 2023-02-10 NOTE — Patient Instructions (Addendum)
Medication Instructions:  Your physician recommends that you continue on your current medications as directed. Please refer to the Current Medication list given to you today.  Labwork: Your physician recommends that you return for a FASTING lipid profile & BMET 02/11/2023. Please do not eat or drink for at least 8 hours when you have this done. You may take your medications that morning with a sip of water. [employer] or [employer] ([address] [place])  Testing/Procedures: none  Follow-Up: Your physician recommends that you schedule a follow-up appointment in: 6 months  Any Other Special Instructions Will Be Listed Below (If Applicable).  If you need a refill on your cardiac medications before your next appointment, please call your pharmacy.

## 2023-03-02 ENCOUNTER — Encounter: Payer: Self-pay | Admitting: Orthopedic Surgery

## 2023-03-02 ENCOUNTER — Ambulatory Visit: Payer: Medicaid Other | Admitting: Orthopedic Surgery

## 2023-03-02 VITALS — BP 162/94 | HR 80 | Ht 63.0 in | Wt 205.0 lb

## 2023-03-02 DIAGNOSIS — M1712 Unilateral primary osteoarthritis, left knee: Secondary | ICD-10-CM | POA: Diagnosis not present

## 2023-03-02 DIAGNOSIS — M1710 Unilateral primary osteoarthritis, unspecified knee: Secondary | ICD-10-CM

## 2023-03-02 DIAGNOSIS — M48061 Spinal stenosis, lumbar region without neurogenic claudication: Secondary | ICD-10-CM | POA: Insufficient documentation

## 2023-03-02 DIAGNOSIS — K529 Noninfective gastroenteritis and colitis, unspecified: Secondary | ICD-10-CM | POA: Insufficient documentation

## 2023-03-02 NOTE — Progress Notes (Signed)
New Patient Visit  Assessment: [name] is a 62 y.o. female with the following: 1. Arthritis of knee  Plan: AMIRIA MARGHEIM has advanced left knee arthritis.  Previous injection helped her pain for 3-4 weeks.  She has had an exacerbation of her pain.  Pain is currently in the lateral aspect of the knee, as well as the posterior aspect of the knee.  On physical exam, she has some swelling in the posterior knee, which is tender to palpation.  She notes a pulling sensation with attempted extension of the knee.  We discussed proceeding with an aspiration of the cyst, as well as steroid injection, and she elected to proceed.  Ultrasound did not demonstrate a fluid collection and we were unable to aspirate any fluid. She was also fitted for a brace.  She is also interested in gel injections, so we will attempt to get authorization through her insurance and schedule an appropriate follow-up.   Procedure note - aspiration left posterior knee joint   Verbal consent was obtained to aspirate the posterior left knee, Baker's cyst Timeout was completed to confirm the site of aspiration. Ultrasound was used to identify the cyst safely.  While using the ultrasound, we were able to palpate a firm area, but this did not appear to contain fluid.   The skin was prepped with alcohol and ethyl chloride was sprayed at the aspiration site.  We did attempt to aspirate this area, but no fluid was aspirated. There were no complications. A sterile bandage was applied.  Note: In order to accurately identify the placement of the needle, ultrasound was required, to increase the accuracy, and specificity of the aspiration and injection.    Follow-up: Return for After Insurance Authorization for Injection.  Subjective:  Chief Complaint  Patient presents with   Knee Pain    Discuss hyaluronic acid injections left knee  (she is aware medicaid will not cover these) wants to know if anything else will with her  knee pain, she said she has to get injections in back and thinks some of knee pain is from her back     History of Present Illness: [name] is a 62 y.o. female who returns for evaluation of left knee pain.  She has a history of left knee pain.  I saw her a couple months ago, at which time we injected the left knee.  She states this helped with the pain for 3-4 weeks.  Since then, her pain is gradually returned.  Currently she is having pain in the lateral aspect of the knee, as well as the posterior aspect of the knee.  She is interested in gel injections.  Review of Systems: No fevers or chills No numbness or tingling No chest pain No shortness of breath No bowel or bladder dysfunction No GI distress No headaches   Objective: BP (!) 162/94   Pulse 80   Ht 5\' 3"  (1.6 m)   Wt 205 lb (93 kg)   LMP 07/18/2011   BMI 36.31 kg/m   Physical Exam:  General: Elderly female., Alert and oriented., and No acute distress. Gait: Ambulates with the assistance of a cane.  Evaluation left knee demonstrates mild effusion.  She has decent range of motion.  Tenderness palpation along the lateral joint line.  Negative Lachman.  No obvious laxity varus stress.  Sensation intact distally.  There is a fullness in the posterior aspect of the knee.  She has pain in the posterior aspect  of the knee with attempts to get to full extension.    IMAGING: No new imaging obtained today   Ultrasound of the posterior knee did not demonstrate any fluid-filled collections.   New Medications:  No orders of the defined types were placed in this encounter.     Oliver Barre, MD  03/02/2023 1:55 PM

## 2023-03-30 ENCOUNTER — Ambulatory Visit (INDEPENDENT_AMBULATORY_CARE_PROVIDER_SITE_OTHER): Payer: Medicaid Other | Admitting: Orthopedic Surgery

## 2023-03-30 DIAGNOSIS — M1712 Unilateral primary osteoarthritis, left knee: Secondary | ICD-10-CM

## 2023-03-30 MED ORDER — CYCLOBENZAPRINE HCL 10 MG PO TABS: 10.0000 mg | ORAL_TABLET | Freq: Two times a day (BID) | ORAL | 0 refills | Status: DC | PRN

## 2023-03-30 MED ORDER — TRAMADOL HCL 50 MG PO TABS: 50.0000 mg | ORAL_TABLET | Freq: Two times a day (BID) | ORAL | 0 refills | Status: DC | PRN

## 2023-03-30 NOTE — Progress Notes (Signed)
Return patient Visit  Assessment: [name] is a 62 y.o. female with the following: 1. Arthritis of knee  Plan: [name] has advanced left knee arthritis.  Recently, her pain has been severe.  She is had injections in the past, with some improvement in her symptoms.  She is interested in an injection again today.  This was completed without issues.  I have also provided her with a limited prescription of tramadol, as well as Flexeril.  If she continues to have issues, requiring strong pain medicines, we will refer her to pain management.   Procedure note injection Left knee joint   Verbal consent was obtained to inject the left knee joint  Timeout was completed to confirm the site of injection.  The skin was prepped with alcohol and ethyl chloride was sprayed at the injection site.  A 21-gauge needle was used to inject 40 mg of Depo-Medrol and 1% lidocaine (3 cc) into the left knee using an anterolateral approach.  There were no complications. A sterile bandage was applied.     Follow-up: Return if symptoms worsen or fail to improve.  Subjective:  Chief Complaint  Patient presents with   Injections    L knee    History of Present Illness: [name] is a 62 y.o. female who returns for evaluation of left knee pain.  She has known left knee arthritis.  I have seen her a couple of times for this.  She has pain diffusely through the left knee, radiating distally.  She is having difficulty achieving full extension.  She is walking with a cane.  Medicines have not been helpful.  She had some relief with the previous steroid injection.  Review of Systems: No fevers or chills No numbness or tingling No chest pain No shortness of breath No bowel or bladder dysfunction No GI distress No headaches     Objective: LMP 07/18/2011   Physical Exam:  General: Elderly female., Alert and oriented., and No acute distress. Gait: Ambulates with the assistance of a  cane.  Left knee with mild effusion.  She has limited range of motion due to pain.  Tenderness palpation along the medial lateral joint line.  No increased laxity varus valgus stress.  Toes warm and well-perfused.    IMAGING: No new imaging obtained today    New Medications:  Meds ordered this encounter  Medications   cyclobenzaprine (FLEXERIL) 10 MG tablet    Sig: Take 1 tablet (10 mg total) by mouth 2 (two) times daily as needed.    Dispense:  20 tablet    Refill:  0   traMADol (ULTRAM) 50 MG tablet    Sig: Take 1 tablet (50 mg total) by mouth every 12 (twelve) hours as needed.    Dispense:  30 tablet    Refill:  0      Oliver Barre, MD  03/30/2023 5:22 PM

## 2023-04-05 ENCOUNTER — Telehealth: Payer: Self-pay | Admitting: Orthopedic Surgery

## 2023-04-05 DIAGNOSIS — M1712 Unilateral primary osteoarthritis, left knee: Secondary | ICD-10-CM

## 2023-04-05 NOTE — Telephone Encounter (Signed)
Dr. Dallas Schimke pt - pt lvm over the weekend stating that Dr. Dallas Schimke told her that he would refer her to a doctor in [place] for her knee.  She would like a referral sent.

## 2023-04-08 ENCOUNTER — Telehealth: Payer: Self-pay | Admitting: Orthopedic Surgery

## 2023-04-08 NOTE — Telephone Encounter (Signed)
Dr. Dallas Schimke pt - pt lvm stating that the Tramadol isn't helping, she stated Dr. Dallas Schimke told her if it didn't he would refer her to someone in [place], she would like that referral

## 2023-04-28 ENCOUNTER — Encounter: Payer: Self-pay | Admitting: Family Medicine

## 2023-04-28 ENCOUNTER — Ambulatory Visit (INDEPENDENT_AMBULATORY_CARE_PROVIDER_SITE_OTHER): Payer: Medicaid Other | Admitting: Family Medicine

## 2023-04-28 VITALS — BP 137/74 | HR 64 | Ht 63.0 in | Wt 215.0 lb

## 2023-04-28 DIAGNOSIS — Z131 Encounter for screening for diabetes mellitus: Secondary | ICD-10-CM

## 2023-04-28 DIAGNOSIS — Z1231 Encounter for screening mammogram for malignant neoplasm of breast: Secondary | ICD-10-CM

## 2023-04-28 DIAGNOSIS — J418 Mixed simple and mucopurulent chronic bronchitis: Secondary | ICD-10-CM | POA: Diagnosis not present

## 2023-04-28 DIAGNOSIS — R0602 Shortness of breath: Secondary | ICD-10-CM | POA: Diagnosis not present

## 2023-04-28 DIAGNOSIS — R1312 Dysphagia, oropharyngeal phase: Secondary | ICD-10-CM

## 2023-04-28 DIAGNOSIS — Z114 Encounter for screening for human immunodeficiency virus [HIV]: Secondary | ICD-10-CM

## 2023-04-28 DIAGNOSIS — Z13220 Encounter for screening for lipoid disorders: Secondary | ICD-10-CM

## 2023-04-28 DIAGNOSIS — Z1329 Encounter for screening for other suspected endocrine disorder: Secondary | ICD-10-CM

## 2023-04-28 DIAGNOSIS — Z122 Encounter for screening for malignant neoplasm of respiratory organs: Secondary | ICD-10-CM

## 2023-04-28 DIAGNOSIS — I10 Essential (primary) hypertension: Secondary | ICD-10-CM | POA: Diagnosis not present

## 2023-04-28 DIAGNOSIS — R1310 Dysphagia, unspecified: Secondary | ICD-10-CM | POA: Insufficient documentation

## 2023-04-28 DIAGNOSIS — Z1211 Encounter for screening for malignant neoplasm of colon: Secondary | ICD-10-CM

## 2023-04-28 DIAGNOSIS — J42 Unspecified chronic bronchitis: Secondary | ICD-10-CM | POA: Insufficient documentation

## 2023-04-28 DIAGNOSIS — Z1159 Encounter for screening for other viral diseases: Secondary | ICD-10-CM

## 2023-04-28 MED ORDER — ALBUTEROL SULFATE HFA 108 (90 BASE) MCG/ACT IN AERS: 2.0000 | INHALATION_SPRAY | Freq: Four times a day (QID) | RESPIRATORY_TRACT | 2 refills | Status: DC | PRN

## 2023-04-28 MED ORDER — GABAPENTIN 300 MG PO CAPS: 300.0000 mg | ORAL_CAPSULE | Freq: Three times a day (TID) | ORAL | 2 refills | Status: DC | PRN

## 2023-04-28 MED ORDER — BENZONATATE 200 MG PO CAPS: 200.0000 mg | ORAL_CAPSULE | Freq: Two times a day (BID) | ORAL | 1 refills | Status: DC | PRN

## 2023-04-28 MED ORDER — PREDNISONE 20 MG PO TABS: 20.0000 mg | ORAL_TABLET | Freq: Two times a day (BID) | ORAL | 0 refills | Status: AC

## 2023-04-28 MED ORDER — ATORVASTATIN CALCIUM 80 MG PO TABS: 80.0000 mg | ORAL_TABLET | Freq: Every day | ORAL | 1 refills | Status: DC

## 2023-04-28 MED ORDER — CYCLOBENZAPRINE HCL 10 MG PO TABS: 10.0000 mg | ORAL_TABLET | Freq: Two times a day (BID) | ORAL | 0 refills | Status: DC | PRN

## 2023-04-28 MED ORDER — METOPROLOL TARTRATE 100 MG PO TABS: 200.0000 mg | ORAL_TABLET | Freq: Two times a day (BID) | ORAL | 1 refills | Status: DC

## 2023-04-28 MED ORDER — CLONIDINE HCL 0.2 MG PO TABS: 0.2000 mg | ORAL_TABLET | Freq: Two times a day (BID) | ORAL | 1 refills | Status: DC

## 2023-04-28 MED ORDER — LISINOPRIL 20 MG PO TABS: 20.0000 mg | ORAL_TABLET | Freq: Every day | ORAL | 3 refills | Status: DC

## 2023-04-28 MED ORDER — AMLODIPINE BESYLATE 10 MG PO TABS: 10.0000 mg | ORAL_TABLET | Freq: Every day | ORAL | 1 refills | Status: DC

## 2023-04-28 MED ORDER — FUROSEMIDE 20 MG PO TABS: 20.0000 mg | ORAL_TABLET | Freq: Every day | ORAL | 1 refills | Status: DC

## 2023-04-28 NOTE — Assessment & Plan Note (Signed)
Prednisone 20 mg twice daily x 5 days, Benzonatate 200 mg PRN. Referral to pulmonary for Evaluation of COPD Currently uses Albuterol inhaler Dicussed symptomatic treatment, rest, increase oral fluid intake.  Follow-up for worsening or persistent symptoms. Patient verbalizes understanding regarding plan of care and all questions answered

## 2023-04-28 NOTE — Patient Instructions (Signed)

## 2023-04-28 NOTE — Assessment & Plan Note (Signed)
Referral placed To GI for further evaluation  Advise patient Eat Slowly: Take your time when eating and chew food thoroughly to reduce the risk of choking. Small Bites and Sips: Cut food into small pieces and take small sips of liquids. Sit Upright: Maintain an upright posture while eating and for at least 30 minutes after meals to help food and liquids move smoothly through the esophagus. Avoid Talking While Eating: Focus on swallowing and avoid distractions like talking, which can increase the risk of choking.

## 2023-04-28 NOTE — Progress Notes (Signed)
New Patient Office Visit   Subjective   Patient ID: [name], female    DOB: Feb 13, 1961  Age: 62 y.o. MRN: [id number]  CC:  Chief Complaint  Patient presents with   Establish Care    Patient is here to establish care. Complains of a sore throat and cough for a week, wants albuterol filled.     HPI [name] 62 year old female, presents to establish care. She  has a past medical history of AAA (abdominal aortic aneurysm) without rupture (HCC) (08/25/2016), Anxiety, Arthritis, Bipolar 1 disorder (HCC), CAD (coronary artery disease), Carotid artery stenosis, COPD (chronic obstructive pulmonary disease) (HCC), Crack cocaine use, Depression, Headache, Hypertension, Myocardial infarction [facility]), Right Groin Hematoma, and Stroke (HCC) (2017).  Dysphagia: Patient complains of difficulty swallowing. The dysphagia occurs with both solids and liquids Symptoms have been present for approximately several months. The symptoms are gradually worsening. The dysphagia has been constant and has been progressive.  She indigestion.  She denies melena, hematochezia, hematemesis, and coffee ground emesis.  This has been associated with cough. She reports choking on food and shortness of breath.       Cough This is a new problem. The current episode started 1 to 4 weeks ago. The problem has been gradually worsening. The problem occurs constantly. The cough is Non-productive. Associated symptoms include heartburn, myalgias, a sore throat, shortness of breath and wheezing. Pertinent negatives include no chest pain, chills or nasal congestion. The symptoms are aggravated by lying down (weather changes). Risk factors for lung disease include smoking/tobacco exposure. She has tried a beta-agonist inhaler and OTC cough suppressant for the symptoms. The treatment provided mild relief.      Outpatient Encounter Medications as of 04/28/2023  Medication Sig   albuterol (VENTOLIN HFA) 108 (90 Base) MCG/ACT  inhaler Inhale 2 puffs into the lungs every 6 (six) hours as needed for wheezing or shortness of breath.   allopurinol (ZYLOPRIM) 100 MG tablet Take 100 mg by mouth 2 (two) times daily.   aspirin EC 81 MG tablet Take 81 mg by mouth daily. Swallow whole.   benzonatate (TESSALON) 200 MG capsule Take 1 capsule (200 mg total) by mouth 2 (two) times daily as needed for cough.   lisinopril (ZESTRIL) 20 MG tablet Take 1 tablet (20 mg total) by mouth daily.   naloxone (NARCAN) nasal spray 4 mg/0.1 mL PLEASE SEE ATTACHED FOR DETAILED DIRECTIONS   nitroGLYCERIN (NITROSTAT) 0.4 MG SL tablet PLACE 1 TABLET UNDER TONGUE AS NEEDED FOR CHEST PAIN EVERY 5 MINUTES X 3 MAX DOSES.  CALL 911 IF PAIN PERSISTS   predniSONE (DELTASONE) 20 MG tablet Take 1 tablet (20 mg total) by mouth 2 (two) times daily with a meal for 5 days.   [DISCONTINUED] albuterol (VENTOLIN HFA) 108 (90 Base) MCG/ACT inhaler INHALE 2 PUFFS BY MOUTH EVERY 6 HOURS AS NEEDED FOR COUGHING, WHEEZING, OR SHORTNESS OF BREATH (Patient taking differently: Inhale 1-2 puffs into the lungs every 6 (six) hours as needed for wheezing or shortness of breath.)   [DISCONTINUED] amLODipine (NORVASC) 10 MG tablet Take 1 tablet (10 mg total) by mouth daily.   [DISCONTINUED] atorvastatin (LIPITOR) 80 MG tablet TAKE 1 Tablet BY MOUTH ONCE EVERY DAY AT 6PM (Patient taking differently: Take 80 mg by mouth daily.)   [DISCONTINUED] buPROPion (WELLBUTRIN SR) 150 MG 12 hr tablet Take 150 mg by mouth 2 (two) times daily.   [DISCONTINUED] cloNIDine (CATAPRES) 0.2 MG tablet Take 1 tablet (0.2 mg total) by  mouth 2 (two) times daily.   [DISCONTINUED] cyclobenzaprine (FLEXERIL) 10 MG tablet Take 1 tablet (10 mg total) by mouth 2 (two) times daily as needed.   [DISCONTINUED] furosemide (LASIX) 20 MG tablet TAKE 1 Tablet BY MOUTH ONCE EVERY DAY (Patient taking differently: Take 20 mg by mouth daily.)   [DISCONTINUED] lisinopril (ZESTRIL) 40 MG tablet Take 1 tablet (40 mg total) by  mouth daily.   [DISCONTINUED] metoprolol tartrate (LOPRESSOR) 100 MG tablet Take 2 tablets (200 mg total) by mouth 2 (two) times daily.   amLODipine (NORVASC) 10 MG tablet Take 1 tablet (10 mg total) by mouth daily.   atorvastatin (LIPITOR) 80 MG tablet Take 1 tablet (80 mg total) by mouth daily.   cloNIDine (CATAPRES) 0.2 MG tablet Take 1 tablet (0.2 mg total) by mouth 2 (two) times daily.   cyclobenzaprine (FLEXERIL) 10 MG tablet Take 1 tablet (10 mg total) by mouth 2 (two) times daily as needed.   furosemide (LASIX) 20 MG tablet Take 1 tablet (20 mg total) by mouth daily.   gabapentin (NEURONTIN) 300 MG capsule Take 1 capsule (300 mg total) by mouth 3 (three) times daily as needed (leg pain).   metoprolol tartrate (LOPRESSOR) 100 MG tablet Take 2 tablets (200 mg total) by mouth 2 (two) times daily.   traMADol (ULTRAM) 50 MG tablet Take 1 tablet (50 mg total) by mouth every 12 (twelve) hours as needed.   [DISCONTINUED] gabapentin (NEURONTIN) 300 MG capsule Take 1 capsule (300 mg total) by mouth 3 (three) times daily as needed. (Patient taking differently: Take 300 mg by mouth 3 (three) times daily as needed (leg pain).)   No facility-administered encounter medications on file as of 04/28/2023.    Past Surgical History:  Procedure Laterality Date   CARDIAC CATHETERIZATION N/A 08/07/2016   Procedure: Left Heart Cath and Coronary Angiography;  Surgeon: Lennette Bihari, MD;  Location: [facility];  Service: Cardiovascular;  Laterality: N/A;   CYST REMOVAL TRUNK Left    beneath L breast   ENDARTERECTOMY Right 02/08/2018   Procedure: ENDARTERECTOMY CAROTID REDO;  Surgeon: Chuck Hint, MD;  Location: [facility] OR;  Service: Vascular;  Laterality: Right;   TUBAL LIGATION     VASCULAR SURGERY Right    right carotid endarterectomy 07/22/05     Review of Systems  Constitutional:  Negative for chills.  HENT:  Positive for sore throat.   Eyes:  Negative for blurred vision.  Respiratory:   Positive for cough, sputum production, shortness of breath and wheezing.   Cardiovascular:  Negative for chest pain.  Gastrointestinal:  Positive for heartburn and nausea. Negative for abdominal pain, blood in stool, constipation and diarrhea.  Genitourinary:  Negative for dysuria.  Musculoskeletal:  Positive for myalgias.  Neurological:  Negative for dizziness.      Objective    BP 137/74   Pulse 64   Ht 5\' 3"  (1.6 m)   Wt 215 lb (97.5 kg)   LMP 07/18/2011   SpO2 (!) 89%   BMI 38.09 kg/m   Physical Exam Vitals reviewed.  Constitutional:      General: She is not in acute distress.    Appearance: Normal appearance. She is not ill-appearing, toxic-appearing or diaphoretic.  HENT:     Head: Normocephalic.     Right Ear: Tympanic membrane normal.     Left Ear: Tympanic membrane normal.     Mouth/Throat:     Mouth: Mucous membranes are moist.  Eyes:  General:        Right eye: No discharge.        Left eye: No discharge.     Conjunctiva/sclera: Conjunctivae normal.  Cardiovascular:     Rate and Rhythm: Normal rate.     Pulses: Normal pulses.     Heart sounds: Normal heart sounds.  Pulmonary:     Breath sounds: Wheezing and rhonchi present.  Abdominal:     General: Bowel sounds are normal.     Palpations: Abdomen is soft.     Tenderness: There is no abdominal tenderness. There is no right CVA tenderness, left CVA tenderness or guarding.  Musculoskeletal:        General: Normal range of motion.     Cervical back: Normal range of motion.  Skin:    General: Skin is warm and dry.     Capillary Refill: Capillary refill takes less than 2 seconds.  Neurological:     General: No focal deficit present.     Mental Status: She is alert and oriented to person, place, and time.     Coordination: Coordination abnormal.     Gait: Gait abnormal.  Psychiatric:        Mood and Affect: Mood normal.        Behavior: Behavior normal.       Assessment & Plan:  Primary  hypertension -     BMP8+EGFR -     CBC with Differential/Platelet -     Microalbumin / creatinine urine ratio  Screening for diabetes mellitus -     Hemoglobin A1c  Screening for lipid disorders -     Lipid panel  Screening for thyroid disorder -     TSH + free T4  Screening for HIV (human immunodeficiency virus) -     HIV Antibody (routine testing w rflx)  Screening for lung cancer -     Ambulatory Referral for Lung Cancer Scre  Screening mammogram for breast cancer  Need for hepatitis C screening test -     Hepatitis C antibody  Screening for colon cancer  SOB (shortness of breath) -     Ambulatory referral to Pulmonology  Oropharyngeal dysphagia Assessment & Plan: Referral placed To GI for further evaluation  Advise patient Eat Slowly: Take your time when eating and chew food thoroughly to reduce the risk of choking. Small Bites and Sips: Cut food into small pieces and take small sips of liquids. Sit Upright: Maintain an upright posture while eating and for at least 30 minutes after meals to help food and liquids move smoothly through the esophagus. Avoid Talking While Eating: Focus on swallowing and avoid distractions like talking, which can increase the risk of choking.  Orders: -     Ambulatory referral to Gastroenterology  Mixed simple and mucopurulent chronic bronchitis (HCC) Assessment & Plan: Prednisone 20 mg twice daily x 5 days, Benzonatate 200 mg PRN. Referral to pulmonary for Evaluation of COPD Currently uses Albuterol inhaler Dicussed symptomatic treatment, rest, increase oral fluid intake.  Follow-up for worsening or persistent symptoms. Patient verbalizes understanding regarding plan of care and all questions answered    Other orders -     Albuterol Sulfate HFA; Inhale 2 puffs into the lungs every 6 (six) hours as needed for wheezing or shortness of breath.  Dispense: 18 g; Refill: 2 -     amLODIPine Besylate; Take 1 tablet (10 mg total) by mouth  daily.  Dispense: 90 tablet; Refill: 1 -  Atorvastatin Calcium; Take 1 tablet (80 mg total) by mouth daily.  Dispense: 90 tablet; Refill: 1 -     cloNIDine HCl; Take 1 tablet (0.2 mg total) by mouth 2 (two) times daily.  Dispense: 180 tablet; Refill: 1 -     Furosemide; Take 1 tablet (20 mg total) by mouth daily.  Dispense: 30 tablet; Refill: 1 -     Gabapentin; Take 1 capsule (300 mg total) by mouth 3 (three) times daily as needed (leg pain).  Dispense: 90 capsule; Refill: 2 -     Metoprolol Tartrate; Take 2 tablets (200 mg total) by mouth 2 (two) times daily.  Dispense: 360 tablet; Refill: 1 -     Lisinopril; Take 1 tablet (20 mg total) by mouth daily.  Dispense: 90 tablet; Refill: 3 -     predniSONE; Take 1 tablet (20 mg total) by mouth 2 (two) times daily with a meal for 5 days.  Dispense: 10 tablet; Refill: 0 -     Benzonatate; Take 1 capsule (200 mg total) by mouth 2 (two) times daily as needed for cough.  Dispense: 20 capsule; Refill: 1 -     Cyclobenzaprine HCl; Take 1 tablet (10 mg total) by mouth 2 (two) times daily as needed.  Dispense: 20 tablet; Refill: 0    Return in about 3 months (around 07/29/2023), or if symptoms worsen or fail to improve, for hypertension, hyperlipidemia.   Cruzita Lederer Newman Nip, FNP

## 2023-04-29 ENCOUNTER — Telehealth: Payer: Self-pay | Admitting: Family Medicine

## 2023-04-29 ENCOUNTER — Other Ambulatory Visit: Payer: Self-pay | Admitting: Family Medicine

## 2023-04-29 MED ORDER — NITROGLYCERIN 0.4 MG SL SUBL: SUBLINGUAL_TABLET | SUBLINGUAL | 6 refills | Status: DC

## 2023-04-29 NOTE — Telephone Encounter (Signed)
Patient calling states her Nitroglycerin was supposed to be refilled but it was not. Please advise Thank you

## 2023-04-29 NOTE — Telephone Encounter (Signed)
Patient called said she was seen yesterday and needs a prescription for NitroGlycerin  Pharmacy: [facility]

## 2023-04-29 NOTE — Telephone Encounter (Signed)
Script was sent. Called to advise patient, but no VM and no answer.

## 2023-05-03 NOTE — Telephone Encounter (Signed)
Spoke with patient. Iliana sent a script for cough, but patient stated that her insurance will not cover what was sent. Patient is going to call her insurance and see what will be covered for cough, and will call back and let us know.

## 2023-05-03 NOTE — Telephone Encounter (Signed)
Patient calling wanting to know if Tenna Child can call her in some cough medication or something else to help with her cough. Please advise Thank you

## 2023-05-06 ENCOUNTER — Other Ambulatory Visit: Payer: Self-pay | Admitting: Family Medicine

## 2023-05-06 DIAGNOSIS — Z8709 Personal history of other diseases of the respiratory system: Secondary | ICD-10-CM | POA: Insufficient documentation

## 2023-05-10 ENCOUNTER — Telehealth: Payer: Self-pay | Admitting: Family Medicine

## 2023-05-10 NOTE — Telephone Encounter (Signed)
Called patient, no answer and no VM. We can not refill antibiotics.

## 2023-05-10 NOTE — Telephone Encounter (Signed)
Patient called in regard to visit on 7/31  Wants to see if provider will send in refill on antibiotic, patient states still experiencing sore throat

## 2023-05-11 NOTE — Telephone Encounter (Signed)
Pt informed

## 2023-05-15 NOTE — Progress Notes (Deleted)
Referring Provider: Rica Records, FNP Primary Care Physician:  Rica Records, FNP Primary Gastroenterologist:  Dr. Bonnetta Barry chief complaint on file.   HPI:   [name] is a 62 y.o. female presenting today at the request of  Del Nigel Berthold, FNP for oropharyngeal dysphagia.   Reviewed office visit with PCP dated 04/28/2023.  Patient reported several months of difficulty swallowing to solids and liquids with associated cough.  Reported choking on food and shortness of breath.  She also reported gradually worsening cough.  Referral was placed to GI and pulmonology  Past Medical History:  Diagnosis Date   AAA (abdominal aortic aneurysm) without rupture (HCC) 08/25/2016   Anxiety    Arthritis    Bipolar 1 disorder (HCC)    CAD (coronary artery disease)    a. 08/08/16 LAD 70%, 60% 1st diag, CTO RCA Med Rx EF 55%   Carotid artery stenosis    Right carotid endarterectomy 07/22/05 with recurrent stenosis 01/2017; known left ICA occlusion   COPD (chronic obstructive pulmonary disease) (HCC)    Crack cocaine use    Depression    Headache    Hypertension    Myocardial infarction [facility])    Right Groin Hematoma    a. 07/2016 following diagnostic cath   Stroke [facility]) 2017    Past Surgical History:  Procedure Laterality Date   CARDIAC CATHETERIZATION N/A 08/07/2016   Procedure: Left Heart Cath and Coronary Angiography;  Surgeon: Lennette Bihari, MD;  Location: [facility];  Service: Cardiovascular;  Laterality: N/A;   CYST REMOVAL TRUNK Left    beneath L breast   ENDARTERECTOMY Right 02/08/2018   Procedure: ENDARTERECTOMY CAROTID REDO;  Surgeon: Chuck Hint, MD;  Location: [facility] OR;  Service: Vascular;  Laterality: Right;   TUBAL LIGATION     VASCULAR SURGERY Right    right carotid endarterectomy 07/22/05     Current Outpatient Medications  Medication Sig Dispense Refill   albuterol (VENTOLIN HFA) 108 (90 Base) MCG/ACT inhaler Inhale  2 puffs into the lungs every 6 (six) hours as needed for wheezing or shortness of breath. 18 g 2   allopurinol (ZYLOPRIM) 100 MG tablet Take 100 mg by mouth 2 (two) times daily.     amLODipine (NORVASC) 10 MG tablet Take 1 tablet (10 mg total) by mouth daily. 90 tablet 1   aspirin EC 81 MG tablet Take 81 mg by mouth daily. Swallow whole.     atorvastatin (LIPITOR) 80 MG tablet Take 1 tablet (80 mg total) by mouth daily. 90 tablet 1   benzonatate (TESSALON) 200 MG capsule Take 1 capsule (200 mg total) by mouth 2 (two) times daily as needed for cough. 20 capsule 1   cloNIDine (CATAPRES) 0.2 MG tablet Take 1 tablet (0.2 mg total) by mouth 2 (two) times daily. 180 tablet 1   cyclobenzaprine (FLEXERIL) 10 MG tablet Take 1 tablet (10 mg total) by mouth 2 (two) times daily as needed. 20 tablet 0   furosemide (LASIX) 20 MG tablet Take 1 tablet (20 mg total) by mouth daily. 30 tablet 1   gabapentin (NEURONTIN) 300 MG capsule Take 1 capsule (300 mg total) by mouth 3 (three) times daily as needed (leg pain). 90 capsule 2   lisinopril (ZESTRIL) 20 MG tablet Take 1 tablet (20 mg total) by mouth daily. 90 tablet 3   metoprolol tartrate (LOPRESSOR) 100 MG tablet Take 2 tablets (200 mg total) by mouth 2 (two)  times daily. 360 tablet 1   naloxone (NARCAN) nasal spray 4 mg/0.1 mL PLEASE SEE ATTACHED FOR DETAILED DIRECTIONS     nitroGLYCERIN (NITROSTAT) 0.4 MG SL tablet PLACE 1 TABLET UNDER TONGUE AS NEEDED FOR CHEST PAIN EVERY 5 MINUTES X 3 MAX DOSES.  CALL 911 IF PAIN PERSISTS 25 tablet 6   traMADol (ULTRAM) 50 MG tablet Take 1 tablet (50 mg total) by mouth every 12 (twelve) hours as needed. 30 tablet 0   No current facility-administered medications for this visit.    Allergies as of 05/17/2023 - Review Complete 04/28/2023  Allergen Reaction Noted   Penicillins Hives and Swelling 07/18/2011    Family History  Problem Relation Age of Onset   Heart attack Mother    Stroke Mother    Hypertension Mother     Diabetes Mother    Heart disease Mother    Heart attack Father    Hypertension Father    Heart disease Father    Cancer Maternal Aunt    Cancer Maternal Uncle    COPD Paternal Uncle        2 pat uncles with COPD   Cancer Paternal Uncle        1 pat uncle with cancer    Social History   Socioeconomic History   Marital status: Single    Spouse name: Not on file   Number of children: Not on file   Years of education: Not on file   Highest education level: Not on file  Occupational History   Not on file  Tobacco Use   Smoking status: Every Day    Current packs/day: 0.50    Average packs/day: 0.5 packs/day for 48.6 years (24.3 ttl pk-yrs)    Types: Cigarettes    Start date: 10/05/1974   Smokeless tobacco: Never  Vaping Use   Vaping status: Never Used  Substance and Sexual Activity   Alcohol use: No   Drug use: Yes    Frequency: 2.0 times per week    Types: Marijuana, "Crack" cocaine    Comment: [email or web address] . reports no more crack since Feb 08, 2018   Sexual activity: Not Currently  Other Topics Concern   Not on file  Social History Narrative   Not on file   Social Determinants of Health   Financial Resource Strain: Not on file  Food Insecurity: No Food Insecurity (12/17/2022)   Hunger Vital Sign    Worried About Running Out of Food in the Last Year: Never true    Ran Out of Food in the Last Year: Never true  Transportation Needs: No Transportation Needs (12/17/2022)   PRAPARE - Administrator, Civil Service (Medical): No    Lack of Transportation (Non-Medical): No  Physical Activity: Not on file  Stress: Not on file  Social Connections: Not on file  Intimate Partner Violence: Not on file    Review of Systems: Gen: Denies any fever, chills, fatigue, weight loss, lack of appetite.  CV: Denies chest pain, heart palpitations, peripheral edema, syncope.  Resp: Denies shortness of breath at rest or with exertion. Denies wheezing or cough.  GI: Denies  dysphagia or odynophagia. Denies jaundice, hematemesis, fecal incontinence. GU : Denies urinary burning, urinary frequency, urinary hesitancy MS: Denies joint pain, muscle weakness, cramps, or limitation of movement.  Derm: Denies rash, itching, dry skin Psych: Denies depression, anxiety, memory loss, and confusion Heme: Denies bruising, bleeding, and enlarged lymph nodes.  Physical Exam: LMP 07/18/2011  General:  Alert and oriented. Pleasant and cooperative. Well-nourished and well-developed.  Head:  Normocephalic and atraumatic. Eyes:  Without icterus, sclera clear and conjunctiva pink.  Ears:  Normal auditory acuity. Lungs:  Clear to auscultation bilaterally. No wheezes, rales, or rhonchi. No distress.  Heart:  S1, S2 present without murmurs appreciated.  Abdomen:  +BS, soft, non-tender and non-distended. No HSM noted. No guarding or rebound. No masses appreciated.  Rectal:  Deferred  Msk:  Symmetrical without gross deformities. Normal posture. Extremities:  Without edema. Neurologic:  Alert and  oriented x4;  grossly normal neurologically. Skin:  Intact without significant lesions or rashes. Psych:  Alert and cooperative. Normal mood and affect.    Assessment:     Plan:  ***   Ermalinda Memos, PA-C [facility] 05/17/2023

## 2023-05-17 ENCOUNTER — Ambulatory Visit: Payer: Medicaid Other | Admitting: Gastroenterology

## 2023-05-17 NOTE — Progress Notes (Unsigned)
GI Office Note    Referring Provider: Wylene Men* Primary Care Physician:  Rica Records, FNP  Primary Gastroenterologist:  Chief Complaint   No chief complaint on file.    History of Present Illness   [name] is a 62 y.o. female presenting today     H/o prior crack cocaine use per PCP note. Dysphagia to solids and liquids occurring for several months, gradually getting worse. C/o indigestion.     CT A/P without contrast 02/2022: IMPRESSION: There is wall thickening in the transverse and descending colon suggesting active inflammatory or infectious colitis. There is no loculated pericolic fluid collection.   There is no evidence of intestinal obstruction or pneumoperitoneum. There is no hydronephrosis. Appendix is not dilated.   There is 4.5 cm fusiform aneurysm in the infrarenal abdominal aorta with interval increase in size. Part of the lumen is filled with thrombus. Possible high grade stenosis of common iliac arteries.    Recommend follow-up every 6 months and vascular consultation.Reference: [facility] [phone].   Diverticulosis of colon without signs of diverticulitis. There is 4.1 cm cyst in the left adnexa. There is 2.8 cm cyst in the right adnexa. Findings may suggest dominant follicle and functional cyst. Follow-up pelvic sonogram should be considered to assess resolution.   Other findings as described in the body of the report.   Medications   Current Outpatient Medications  Medication Sig Dispense Refill   albuterol (VENTOLIN HFA) 108 (90 Base) MCG/ACT inhaler Inhale 2 puffs into the lungs every 6 (six) hours as needed for wheezing or shortness of breath. 18 g 2   allopurinol (ZYLOPRIM) 100 MG tablet Take 100 mg by mouth 2 (two) times daily.     amLODipine (NORVASC) 10 MG tablet Take 1 tablet (10 mg total) by mouth daily. 90 tablet 1   aspirin EC 81 MG tablet Take 81 mg by mouth daily. Swallow  whole.     atorvastatin (LIPITOR) 80 MG tablet Take 1 tablet (80 mg total) by mouth daily. 90 tablet 1   benzonatate (TESSALON) 200 MG capsule Take 1 capsule (200 mg total) by mouth 2 (two) times daily as needed for cough. 20 capsule 1   cloNIDine (CATAPRES) 0.2 MG tablet Take 1 tablet (0.2 mg total) by mouth 2 (two) times daily. 180 tablet 1   cyclobenzaprine (FLEXERIL) 10 MG tablet Take 1 tablet (10 mg total) by mouth 2 (two) times daily as needed. 20 tablet 0   furosemide (LASIX) 20 MG tablet Take 1 tablet (20 mg total) by mouth daily. 30 tablet 1   gabapentin (NEURONTIN) 300 MG capsule Take 1 capsule (300 mg total) by mouth 3 (three) times daily as needed (leg pain). 90 capsule 2   lisinopril (ZESTRIL) 20 MG tablet Take 1 tablet (20 mg total) by mouth daily. 90 tablet 3   metoprolol tartrate (LOPRESSOR) 100 MG tablet Take 2 tablets (200 mg total) by mouth 2 (two) times daily. 360 tablet 1   naloxone (NARCAN) nasal spray 4 mg/0.1 mL PLEASE SEE ATTACHED FOR DETAILED DIRECTIONS     nitroGLYCERIN (NITROSTAT) 0.4 MG SL tablet PLACE 1 TABLET UNDER TONGUE AS NEEDED FOR CHEST PAIN EVERY 5 MINUTES X 3 MAX DOSES.  CALL 911 IF PAIN PERSISTS 25 tablet 6   traMADol (ULTRAM) 50 MG tablet Take 1 tablet (50 mg total) by mouth every 12 (twelve) hours as needed. 30 tablet 0   No current facility-administered medications for this  visit.    Allergies   Allergies as of 05/18/2023 - Review Complete 04/28/2023  Allergen Reaction Noted   Penicillins Hives and Swelling 07/18/2011    Past Medical History   Past Medical History:  Diagnosis Date   AAA (abdominal aortic aneurysm) without rupture (HCC) 08/25/2016   Anxiety    Arthritis    Bipolar 1 disorder (HCC)    CAD (coronary artery disease)    a. 08/08/16 LAD 70%, 60% 1st diag, CTO RCA Med Rx EF 55%   Carotid artery stenosis    Right carotid endarterectomy 07/22/05 with recurrent stenosis 01/2017; known left ICA occlusion   COPD (chronic obstructive  pulmonary disease) (HCC)    Crack cocaine use    Depression    Headache    Hypertension    Myocardial infarction [facility])    Right Groin Hematoma    a. 07/2016 following diagnostic cath   Stroke [facility]) 2017    Past Surgical History   Past Surgical History:  Procedure Laterality Date   CARDIAC CATHETERIZATION N/A 08/07/2016   Procedure: Left Heart Cath and Coronary Angiography;  Surgeon: Lennette Bihari, MD;  Location: [facility] INVASIVE CV LAB;  Service: Cardiovascular;  Laterality: N/A;   CYST REMOVAL TRUNK Left    beneath L breast   ENDARTERECTOMY Right 02/08/2018   Procedure: ENDARTERECTOMY CAROTID REDO;  Surgeon: Chuck Hint, MD;  Location: [facility] OR;  Service: Vascular;  Laterality: Right;   TUBAL LIGATION     VASCULAR SURGERY Right    right carotid endarterectomy 07/22/05     Past Family History   Family History  Problem Relation Age of Onset   Heart attack Mother    Stroke Mother    Hypertension Mother    Diabetes Mother    Heart disease Mother    Heart attack Father    Hypertension Father    Heart disease Father    Cancer Maternal Aunt    Cancer Maternal Uncle    COPD Paternal Uncle        2 pat uncles with COPD   Cancer Paternal Uncle        1 pat uncle with cancer    Past Social History   Social History   Socioeconomic History   Marital status: Single    Spouse name: Not on file   Number of children: Not on file   Years of education: Not on file   Highest education level: Not on file  Occupational History   Not on file  Tobacco Use   Smoking status: Every Day    Current packs/day: 0.50    Average packs/day: 0.5 packs/day for 48.6 years (24.3 ttl pk-yrs)    Types: Cigarettes    Start date: 10/05/1974   Smokeless tobacco: Never  Vaping Use   Vaping status: Never Used  Substance and Sexual Activity   Alcohol use: No   Drug use: Yes    Frequency: 2.0 times per week    Types: Marijuana, "Crack" cocaine    Comment: marijauna . reports no more crack  since Feb 08, 2018   Sexual activity: Not Currently  Other Topics Concern   Not on file  Social History Narrative   Not on file   Social Determinants of Health   Financial Resource Strain: Not on file  Food Insecurity: No Food Insecurity (12/17/2022)   Hunger Vital Sign    Worried About Running Out of Food in the Last Year: Never true    Ran Out of Food  in the Last Year: Never true  Transportation Needs: No Transportation Needs (12/17/2022)   PRAPARE - Administrator, Civil Service (Medical): No    Lack of Transportation (Non-Medical): No  Physical Activity: Not on file  Stress: Not on file  Social Connections: Not on file  Intimate Partner Violence: Not on file    Review of Systems   General: Negative for anorexia, weight loss, fever, chills, fatigue, weakness. Eyes: Negative for vision changes.  ENT: Negative for hoarseness, difficulty swallowing , nasal congestion. CV: Negative for chest pain, angina, palpitations, dyspnea on exertion, peripheral edema.  Respiratory: Negative for dyspnea at rest, dyspnea on exertion, cough, sputum, wheezing.  GI: See history of present illness. GU:  Negative for dysuria, hematuria, urinary incontinence, urinary frequency, nocturnal urination.  MS: Negative for joint pain, low back pain.  Derm: Negative for rash or itching.  Neuro: Negative for weakness, abnormal sensation, seizure, frequent headaches, memory loss,  confusion.  Psych: Negative for anxiety, depression, suicidal ideation, hallucinations.  Endo: Negative for unusual weight change.  Heme: Negative for bruising or bleeding. Allergy: Negative for rash or hives.  Physical Exam   LMP 07/18/2011    General: Well-nourished, well-developed in no acute distress.  Head: Normocephalic, atraumatic.   Eyes: Conjunctiva pink, no icterus. Mouth: Oropharyngeal mucosa moist and pink , no lesions erythema or exudate. Neck: Supple without thyromegaly, masses, or  lymphadenopathy.  Lungs: Clear to auscultation bilaterally.  Heart: Regular rate and rhythm, no murmurs rubs or gallops.  Abdomen: Bowel sounds are normal, nontender, nondistended, no hepatosplenomegaly or masses,  no abdominal bruits or hernia, no rebound or guarding.   Rectal: *** Extremities: No lower extremity edema. No clubbing or deformities.  Neuro: Alert and oriented x 4 , grossly normal neurologically.  Skin: Warm and dry, no rash or jaundice.   Psych: Alert and cooperative, normal mood and affect.  Labs   Lab Results  Component Value Date   NA 137 03/07/2022   CL 104 03/07/2022   K 3.5 03/07/2022   CO2 26 03/07/2022   BUN 29 (H) 03/07/2022   CREATININE 1.17 (H) 03/07/2022   GFRNONAA 53 (L) 03/07/2022   CALCIUM 8.6 (L) 03/07/2022   ALBUMIN 3.6 03/07/2022   GLUCOSE 156 (H) 03/07/2022   Lab Results  Component Value Date   ALT 15 03/07/2022   AST 18 03/07/2022   ALKPHOS 104 03/07/2022   BILITOT 0.9 03/07/2022   Lab Results  Component Value Date   WBC 19.1 (H) 03/07/2022   HGB 15.2 (H) 03/07/2022   HCT 46.8 (H) 03/07/2022   MCV 94.2 03/07/2022   PLT 402 (H) 03/07/2022   Lab Results  Component Value Date   LIPASE 19 03/07/2022    Imaging Studies   No results found.  Assessment       PLAN   ***   Leanna Battles. Melvyn Neth, MHS, PA-C [facility]

## 2023-05-18 ENCOUNTER — Encounter: Payer: Self-pay | Admitting: Gastroenterology

## 2023-05-18 ENCOUNTER — Ambulatory Visit: Payer: Medicaid Other | Admitting: Gastroenterology

## 2023-05-18 ENCOUNTER — Telehealth: Payer: Self-pay | Admitting: Gastroenterology

## 2023-05-18 VITALS — BP 134/76 | HR 81 | Temp 97.4°F | Ht 63.0 in | Wt 212.4 lb

## 2023-05-18 DIAGNOSIS — K219 Gastro-esophageal reflux disease without esophagitis: Secondary | ICD-10-CM | POA: Diagnosis not present

## 2023-05-18 DIAGNOSIS — R1310 Dysphagia, unspecified: Secondary | ICD-10-CM | POA: Insufficient documentation

## 2023-05-18 MED ORDER — SUCRALFATE 1 GM/10ML PO SUSP: 1.0000 g | Freq: Four times a day (QID) | ORAL | 0 refills | Status: AC

## 2023-05-18 MED ORDER — PANTOPRAZOLE SODIUM 40 MG PO TBEC: 40.0000 mg | DELAYED_RELEASE_TABLET | Freq: Two times a day (BID) | ORAL | 3 refills | Status: DC

## 2023-05-18 NOTE — Telephone Encounter (Signed)
Patient seen in the office today. She needs to be scheduled for EGD/ED with Dr.Carver. ASA 3/4.   Dx: dysphagia, odynophagia.  Her daughter passed away so she is not available this week, but otherwise recommend asap procedure.

## 2023-05-18 NOTE — Patient Instructions (Signed)
Prescription for pantoprazole once tablet twice daily before breakfast and evening meal sent to [facility]. Prescription for carafate liquid, take 1 gram before meals and at bedtime (four times daily) for 10 days.  We will schedule an upper endoscopy in near future.  Please contact your PCP for ongoing issues with your ears and coughing.

## 2023-05-19 ENCOUNTER — Telehealth: Payer: Self-pay | Admitting: Family Medicine

## 2023-05-19 NOTE — Telephone Encounter (Signed)
Patient calling back waiting on a call back from nurse or provider.

## 2023-05-19 NOTE — Telephone Encounter (Signed)
Called pt, line busy x 3/ [facility]

## 2023-05-19 NOTE — Telephone Encounter (Signed)
Patient called in\, wants a call back from provider. Woke up and found daughter deceased wants to see if provider can send something in to help cope . Wants a call back

## 2023-05-20 NOTE — Telephone Encounter (Signed)
Please schedule an appointment with me whenever possible so I can do my appropriate assessment or she can go to Urgent care Behavioral health in [place]

## 2023-05-20 NOTE — Telephone Encounter (Signed)
Called pt, line still rings busy

## 2023-05-20 NOTE — Telephone Encounter (Signed)
What does the patient is asking for?

## 2023-05-20 NOTE — Telephone Encounter (Signed)
Patient wants a call back in regard to previous tele messages.  Patient is having a hard time and wants a call back.  Call back [phone]

## 2023-05-20 NOTE — Telephone Encounter (Signed)
Pt LVM crying and sobbing needing to speak with her provider. Regarding previous message- found daughter deceased needing help with something to cope  Please see bottom note

## 2023-05-20 NOTE — Telephone Encounter (Signed)
For immediate care

## 2023-05-21 ENCOUNTER — Telehealth (INDEPENDENT_AMBULATORY_CARE_PROVIDER_SITE_OTHER): Payer: Medicaid Other | Admitting: Family Medicine

## 2023-05-21 DIAGNOSIS — Z634 Disappearance and death of family member: Secondary | ICD-10-CM

## 2023-05-21 DIAGNOSIS — F419 Anxiety disorder, unspecified: Secondary | ICD-10-CM

## 2023-05-21 MED ORDER — CLONAZEPAM 0.5 MG PO TABS: 0.5000 mg | ORAL_TABLET | Freq: Two times a day (BID) | ORAL | 0 refills | Status: AC | PRN

## 2023-05-21 NOTE — Assessment & Plan Note (Signed)
Started Klonopin 0.5 for PRN  Discussed non pharmacological interventions such seeing a therapist,having support system and family present. Patient interested in Psychiatry referral in a few months from now. Resources given to patient to Urgent care Behavioral Health for immediate care

## 2023-05-21 NOTE — Telephone Encounter (Signed)
Pt appt scheduled

## 2023-05-21 NOTE — Progress Notes (Signed)
Virtual Visit via Video Note  I connected with [name] on 05/21/23 at 10:20 AM EDT by a video enabled telemedicine application and verified that I am speaking with the correct person using two identifiers.  Patient Location: Home Provider Location: Office/Clinic  I discussed the limitations, risks, security, and privacy concerns of performing an evaluation and management service by video and the availability of in person appointments. I also discussed with the patient that there may be a patient responsible charge related to this service. The patient expressed understanding and agreed to proceed.  Subjective: PCP: Rica Records, FNP  No chief complaint on file.  [name] 62 year old female,  presents via telehealth for evaluation of anxiety following a major life event. She reports waking up three days ago to find her daughter unexpectedly deceased. During the session, the patient is visibly distressed, crying, and experiencing hyperventilation as she recounts the event. Anxiety has been gradually worsening. Symptoms include depressed mood, excessive worry, insomnia, irritability, muscle tension, nervous/anxious behavior, palpitations and restlessness. Symptoms occur constantly. The severity of symptoms is severe. The patient sleeps 3 hours per night. The quality of sleep is poor. Nighttime awakenings: several. Risk factors include a major life event and prior traumatic experience. Her past medical history is significant for anxiety/panic attacks. Past treatments include nothing.       ROS: Per HPI  Current Outpatient Medications:    clonazePAM (KLONOPIN) 0.5 MG tablet, Take 1 tablet (0.5 mg total) by mouth 2 (two) times daily as needed for anxiety., Disp: 14 tablet, Rfl: 0   albuterol (VENTOLIN HFA) 108 (90 Base) MCG/ACT inhaler, Inhale 2 puffs into the lungs every 6 (six) hours as needed for wheezing or shortness of breath., Disp: 18 g, Rfl: 2   allopurinol  (ZYLOPRIM) 100 MG tablet, Take 100 mg by mouth 2 (two) times daily., Disp: , Rfl:    amLODipine (NORVASC) 10 MG tablet, Take 1 tablet (10 mg total) by mouth daily., Disp: 90 tablet, Rfl: 1   aspirin EC 81 MG tablet, Take 81 mg by mouth daily. Swallow whole., Disp: , Rfl:    atorvastatin (LIPITOR) 80 MG tablet, Take 1 tablet (80 mg total) by mouth daily., Disp: 90 tablet, Rfl: 1   cloNIDine (CATAPRES) 0.2 MG tablet, Take 1 tablet (0.2 mg total) by mouth 2 (two) times daily., Disp: 180 tablet, Rfl: 1   cyclobenzaprine (FLEXERIL) 10 MG tablet, Take 1 tablet (10 mg total) by mouth 2 (two) times daily as needed., Disp: 20 tablet, Rfl: 0   furosemide (LASIX) 20 MG tablet, Take 1 tablet (20 mg total) by mouth daily., Disp: 30 tablet, Rfl: 1   gabapentin (NEURONTIN) 300 MG capsule, Take 1 capsule (300 mg total) by mouth 3 (three) times daily as needed (leg pain)., Disp: 90 capsule, Rfl: 2   lisinopril (ZESTRIL) 20 MG tablet, Take 1 tablet (20 mg total) by mouth daily., Disp: 90 tablet, Rfl: 3   metoprolol tartrate (LOPRESSOR) 100 MG tablet, Take 2 tablets (200 mg total) by mouth 2 (two) times daily., Disp: 360 tablet, Rfl: 1   naloxone (NARCAN) nasal spray 4 mg/0.1 mL, PLEASE SEE ATTACHED FOR DETAILED DIRECTIONS, Disp: , Rfl:    nitroGLYCERIN (NITROSTAT) 0.4 MG SL tablet, PLACE 1 TABLET UNDER TONGUE AS NEEDED FOR CHEST PAIN EVERY 5 MINUTES X 3 MAX DOSES.  CALL 911 IF PAIN PERSISTS, Disp: 25 tablet, Rfl: 6   oxyCODONE-acetaminophen (PERCOCET/ROXICET) 5-325 MG tablet, Take 1 tablet by mouth  every 6 (six) hours as needed., Disp: , Rfl:    pantoprazole (PROTONIX) 40 MG tablet, Take 1 tablet (40 mg total) by mouth 2 (two) times daily before a meal., Disp: 60 tablet, Rfl: 3   sucralfate (CARAFATE) 1 GM/10ML suspension, Take 10 mLs (1 g total) by mouth 4 (four) times daily for 10 days., Disp: 414 mL, Rfl: 0   XTAMPZA ER 9 MG C12A, Take 1 capsule by mouth 2 (two) times daily., Disp: , Rfl:    Observations/Objective: There were no vitals filed for this visit. Physical Exam Telehealth visit  Assessment and Plan: Bereavement due to life event -     clonazePAM; Take 1 tablet (0.5 mg total) by mouth 2 (two) times daily as needed for anxiety.  Dispense: 14 tablet; Refill: 0  Anxiety Assessment & Plan: Started Klonopin 0.5 for PRN  Discussed non pharmacological interventions such seeing a therapist,having support system and family present. Patient interested in Psychiatry referral in a few months from now. Resources given to patient to Urgent care Behavioral Health for immediate care     Follow Up Instructions: No follow-ups on file.   I discussed the assessment and treatment plan with the patient. The patient was provided an opportunity to ask questions, and all were answered. The patient agreed with the plan and demonstrated an understanding of the instructions.   The patient was advised to call back or seek an in-person evaluation if the symptoms worsen or if the condition fails to improve as anticipated.  The above assessment and management plan was discussed with the patient. The patient verbalized understanding of and has agreed to the management plan.   Cruzita Lederer Newman Nip, FNP

## 2023-05-23 DIAGNOSIS — J189 Pneumonia, unspecified organism: Secondary | ICD-10-CM | POA: Insufficient documentation

## 2023-05-23 DIAGNOSIS — J9601 Acute respiratory failure with hypoxia: Secondary | ICD-10-CM | POA: Insufficient documentation

## 2023-05-23 DIAGNOSIS — D72829 Elevated white blood cell count, unspecified: Secondary | ICD-10-CM | POA: Insufficient documentation

## 2023-05-23 DIAGNOSIS — N179 Acute kidney failure, unspecified: Secondary | ICD-10-CM | POA: Insufficient documentation

## 2023-05-24 ENCOUNTER — Telehealth: Payer: Self-pay

## 2023-05-24 NOTE — Transitions of Care (Post Inpatient/ED Visit) (Unsigned)
05/24/2023  Name: [name] MRN: [id number] DOB: 27-Apr-1961  Today's TOC FU Call Status: Today's TOC FU Call Status:: Successful TOC FU Call Completed TOC FU Call Complete Date: 05/24/23 Patient's Name and Date of Birth confirmed.  Transition Care Management Follow-up Telephone Call Date of Discharge: 05/23/23 Discharge Facility: Other [facility]) Name of Other (Non-[facility]) Discharge Facility: [facility] Type of Discharge: Inpatient Admission Primary Inpatient Discharge Diagnosis:: pneumonia How have you been since you were released from the hospital?: Same Any questions or concerns?: Yes  Items Reviewed: Did you receive and understand the discharge instructions provided?: Yes Medications obtained,verified, and reconciled?: Yes (Medications Reviewed) Any new allergies since your discharge?: No Dietary orders reviewed?: Yes Do you have support at home?: Yes People in Home: child(ren), adult  Medications Reviewed Today: Medications Reviewed Today     Reviewed by Karena Addison, LPN (Licensed Practical Nurse) on 05/24/23 at 1432  Med List Status: <None>   Medication Order Taking? Sig Documenting Provider Last Dose Status Informant  albuterol (VENTOLIN HFA) 108 (90 Base) MCG/ACT inhaler [id number] No Inhale 2 puffs into the lungs every 6 (six) hours as needed for wheezing or shortness of breath. Del Newman Nip, Tenna Child, FNP Taking Active   allopurinol (ZYLOPRIM) 100 MG tablet [id number] No Take 100 mg by mouth 2 (two) times daily. [provider] Taking Active   amLODipine (NORVASC) 10 MG tablet [id number] No Take 1 tablet (10 mg total) by mouth daily. Del Newman Nip, Tenna Child, FNP Taking Active   aspirin EC 81 MG tablet [id number] No Take 81 mg by mouth daily. Swallow whole. [provider] Taking Active   atorvastatin (LIPITOR) 80 MG tablet [id number] No Take 1 tablet (80 mg total) by mouth daily. Del Newman Nip, Tenna Child, FNP Taking Active   clonazePAM  (KLONOPIN) 0.5 MG tablet [id number]  Take 1 tablet (0.5 mg total) by mouth 2 (two) times daily as needed for anxiety. Del Newman Nip, Tenna Child, FNP  Active   cloNIDine (CATAPRES) 0.2 MG tablet [id number] No Take 1 tablet (0.2 mg total) by mouth 2 (two) times daily. Del Newman Nip, Tenna Child, FNP Taking Active   cyclobenzaprine (FLEXERIL) 10 MG tablet [id number] No Take 1 tablet (10 mg total) by mouth 2 (two) times daily as needed. Del Newman Nip, Tenna Child, FNP Taking Active   furosemide (LASIX) 20 MG tablet [id number] No Take 1 tablet (20 mg total) by mouth daily. Del Newman Nip, Tenna Child, FNP Taking Active   gabapentin (NEURONTIN) 300 MG capsule [id number] No Take 1 capsule (300 mg total) by mouth 3 (three) times daily as needed (leg pain). Del Newman Nip, Tenna Child, FNP Taking Active   lisinopril (ZESTRIL) 20 MG tablet [id number] No Take 1 tablet (20 mg total) by mouth daily. Del Newman Nip, Tenna Child, FNP Taking Active   metoprolol tartrate (LOPRESSOR) 100 MG tablet [id number] No Take 2 tablets (200 mg total) by mouth 2 (two) times daily. Del Newman Nip, Tenna Child, FNP Taking Active   naloxone [facility]) nasal spray 4 mg/0.1 mL [id number] No PLEASE SEE ATTACHED FOR DETAILED DIRECTIONS [provider] Taking Active   nitroGLYCERIN (NITROSTAT) 0.4 MG SL tablet [id number] No PLACE 1 TABLET UNDER TONGUE AS NEEDED FOR CHEST PAIN EVERY 5 MINUTES X 3 MAX DOSES.  CALL 911 IF PAIN PERSISTS Del Newman Nip, Tull, FNP Taking Active   oxyCODONE-acetaminophen (PERCOCET/ROXICET) 5-325 MG tablet [id number]  Take 1 tablet by mouth every 6 (six) hours as needed. [provider]  Active   pantoprazole (PROTONIX) 40 MG  tablet [id number]  Take 1 tablet (40 mg total) by mouth 2 (two) times daily before a meal. Tiffany Kocher, PA-C  Active   sucralfate (CARAFATE) 1 GM/10ML suspension [id number]  Take 10 mLs (1 g total) by mouth 4 (four) times daily for 10 days. Tiffany Kocher, PA-C  Active   XTAMPZA ER 9 MG C12A  [id number]  Take 1 capsule by mouth 2 (two) times daily. [provider]  Active             Home Care and Equipment/Supplies: Were Home Health Services Ordered?: NA Any new equipment or medical supplies ordered?: NA  Functional Questionnaire: Do you need assistance with bathing/showering or dressing?: No Do you need assistance with meal preparation?: No Do you need assistance with eating?: No Do you have difficulty maintaining continence: No Do you need assistance with getting out of bed/getting out of a chair/moving?: No Do you have difficulty managing or taking your medications?: No  Follow up appointments reviewed: PCP Follow-up appointment confirmed?: No (no avail appt, sent message to staff to schedule) MD Provider Line Number:[phone] Given: No Specialist Hospital Follow-up appointment confirmed?: NA Do you need transportation to your follow-up appointment?: No Do you understand care options if your condition(s) worsen?: Yes-patient verbalized understanding    SIGNATURE Karena Addison, LPN [facility] Nurse Health Advisor Direct Dial [phone]

## 2023-05-24 NOTE — Progress Notes (Unsigned)
[name], female    DOB: [birth year]    MRN: [id number]   Brief patient profile:  62  yowf  active smoker  referred to pulmonary clinic in [place]  05/25/2023 by [facility] NP  for new onset resp failure no longer fully ambulatory due to L Knee /back pain s/p admit/ d/c ama:  Admit date: 05/22/2023 Discharge date: 05/23/2023   Discharge Diagnoses:  Acute respiratory failure with hypoxia and hypercarbia (CMS-HCC) Tobacco abuse Leukocytosis CAP (community acquired pneumonia) AKI (acute kidney injury) (CMS-HCC)  Hospital Course:  [name] is a 62 y.o. female that presented to [facility] and was admitted for Acute respiratory failure with hypoxia and hypercarbia (CMS-HCC) on 05/22/2023 6:19 PM .  Please refer to H&P for full history.  In summary, the patient presented to the ER with a complaint of acute weakness. The patient was at the funeral home for her daughters viewing after she passed unexpectedly earlier this week. Per H&P it is reported that patient suddenly felt weak in bilateral legs and arms. She reported having trouble standing or holding anything. It was reported to the ED nurse that patient was dyspneic. Patient denies any recent cough or fever. She does smoke half a pack of cigarettes per day. Patient reported that she was in the process of being evaluated for COPD outpatient. Patient also has chronic swelling in both of her lower legs but unclear on how long this has been occurring.  Patient was admitted for acute hypoxic/hypercarbic respiratory failure. She was started on BiPAP in the ED. Checks x-ray notable for mild cardiomegaly or/vascular congestion. Patient had a normal echo in 2023. An order was initiated for repeat echocardiogram given potential for Takotsubo. Patient was given a dose of IV Lasix. Given her hypercarbia, history of tobacco use symptoms likely COPD. Patient was started on bronchodilators, systemic steroids and covered with  empiric CAP antibiotics with Levaquin and aztreonam given penicillin allergy. Patient noted to have leukocytosis. Procalcitonin was 0.10  Patient also noted to have AKI with a creatinine of 1.1 back in 6/23. On admission 1.96 patient was noted to be hypotensive initially on presentation which could be underlying etiology. Home lisinopril was held at time of admission. Blood pressure improved. On 05/23/2023 patient's creatinine 1.37.  Hypotension. Patient initially hypotensive but improved throughout hospitalization. Patient was restarted on clonidine and Norvasc but lisinopril and metoprolol held. Hypotension not thought to be related to sepsis. Suspected this was coupled with grief reaction along with benzodiazepines and chronic opioid use.  Anxiety/depression. Patient takes Klonopin as needed. Which was resumed throughout her hospitalization.  Odynophagia. Recently started on twice daily PPI/Carafate per GI with plans for outpatient EGD in the coming weeks. Patient given PPI and Carafate during hospital stay.  Patient has a history of chronic pain in her home chronic pain medication was given while in the hospital.  Patient was counseled on tobacco cessation.  Unfortunately, patient left AMA. Patient was adamant that she wanted to go home. Patient was very emotional, tearful, and upset over the passing of her daughter. I explained that our recommendation is that she stay for ongoing treatment as she had audible wheezing, shortness of breath, cough, and supplemental oxygen 2 L nasal cannula.  Patient was explained the risks of leaving AMA which included rehospitalization, poor outcome including death. I spoke to Bjorn Loser who is at her home with her son who is hard of hearing and explained this to her care with the patient's permission.  Patient  will be given prescription for Levaquin, prednisone, nebulizer machine, and DuoNebs.  Condition at Discharge: stable Discharge Medications:  START taking  these medications   ipratropium-albuterol 0.5-2.5 mg/3 mL nebulizer Commonly known as: DUO-NEB Inhale 3 mL by nebulization every four (4) hours as needed.  levoFLOXacin 750 MG tablet Commonly known as: LEVAQUIN Take 1 tablet (750 mg total) by mouth daily for 5 days.  nebulizer and compressor Devi 1 each by Miscellaneous route in the morning.  predniSONE 20 MG tablet Commonly known as: DELTASONE Take 2 tablets (40 mg total) by mouth daily for 5 days.     CONTINUE taking these medications   albuterol 90 mcg/actuation inhaler Commonly known as: PROVENTIL HFA;VENTOLIN HFA Inhale 2 puffs every six (6) hours as needed.  allopurinol 100 MG tablet Commonly known as: ZYLOPRIM Take 1 tablet (100 mg total) by mouth Two (2) times a day (at 8am and 12:00).  amlodipine 10 MG tablet Commonly known as: NORVASC Take 1 tablet (10 mg total) by mouth daily.  aspirin 81 MG tablet Commonly known as: ECOTRIN Take 1 tablet (81 mg total) by mouth daily.  atorvastatin 80 MG tablet Commonly known as: LIPITOR Take 1 tablet (80 mg total) by mouth daily.  clonazePAM 0.5 MG tablet Commonly known as: KlonoPIN Take 1 tablet (0.5 mg total) by mouth two (2) times a day as needed.  cloNIDine HCL 0.2 MG tablet Commonly known as: CATAPRES Take 1 tablet (0.2 mg total) by mouth two (2) times a day.  cyclobenzaprine 10 MG tablet Commonly known as: FLEXERIL Take 1 tablet (10 mg total) by mouth two (2) times a day as needed.  gabapentin 300 MG capsule Commonly known as: NEURONTIN Take 1 capsule (300 mg total) by mouth Three (3) times a day.  gabapentin 300 MG capsule Commonly known as: NEURONTIN Take 1 capsule (300 mg total) by mouth Three (3) times a day.  lisinopril 40 MG tablet Commonly known as: PRINIVIL,ZESTRIL Take 1 tablet (40 mg total) by mouth daily.  metoPROLOL tartrate 100 MG tablet Commonly known as: Lopressor Take 1 tablet (100 mg total) by mouth two (2) times a  day.  oxyCODONE-acetaminophen 5-325 mg per tablet Commonly known as: PERCOCET Take 1 tablet by mouth every four (4) hours as needed for pain.  pantoprazole 40 MG tablet Commonly known as: Protonix Take 1 tablet (40 mg total) by mouth two (2) times a day.  XTAMPZA ER 9 mg Cspt 12 hr ER capsule sprinkle Generic drug: oxyCODONE Take 1 capsule (9 mg total) by mouth two (2) times a day.     History of Present Illness  05/25/2023  Pulmonary/ 1st office eval/ Zollie Ellery / [place] [facility]  Levaquin D#1 / has not started prednisone  Chief Complaint  Patient presents with   Establish Care   Shortness of Breath  Dyspnea:  ok at rest  Cough: variably yellow - not taken levaquin on day of ov  Sleep: 30 degrees  SABA use: has nebulizer once a day prior to OV  / has hfa avg twice daily but not used on day of ov  02: 2lpm but can't tol NP   No obvious day to day or daytime pattern/variability or assoc   mucus plugs or hemoptysis or cp or chest tightness, subjective wheeze or overt sinus or hb symptoms.    Also denies any obvious fluctuation of symptoms with weather or environmental changes or other aggravating or alleviating factors except as outlined above   No unusual exposure hx or h/o  childhood pna/ asthma or knowledge of premature birth.  Current Allergies, Complete Past Medical History, Past Surgical History, Family History, and Social History were reviewed in [employer] record.  ROS  The following are not active complaints unless bolded Hoarseness, sore throat, dysphagia, dental problems, itching, sneezing,  nasal congestion or discharge of excess mucus or purulent secretions, ear ache,   fever, chills, sweats, unintended wt loss or wt gain, classically pleuritic or exertional cp,  orthopnea pnd or arm/hand swelling  or leg swelling, presyncope, palpitations, abdominal pain, anorexia, nausea, vomiting, diarrhea  or change in bowel habits or change in bladder habits,  change in stools or change in urine, dysuria, hematuria,  rash, arthralgias, visual complaints, headache, numbness, weakness or ataxia or problems with walking or coordination,  change in mood or  memory.              Outpatient Medications Prior to Visit -  - NOTE:   Unable to verify as accurately reflecting what pt takes    Medication Sig Dispense Refill   albuterol (VENTOLIN HFA) 108 (90 Base) MCG/ACT inhaler Inhale 2 puffs into the lungs every 6 (six) hours as needed for wheezing or shortness of breath. 18 g 2   amLODipine (NORVASC) 10 MG tablet Take 1 tablet (10 mg total) by mouth daily. 90 tablet 1   aspirin EC 81 MG tablet Take 81 mg by mouth daily. Swallow whole.     atorvastatin (LIPITOR) 80 MG tablet Take 1 tablet (80 mg total) by mouth daily. 90 tablet 1   buPROPion (WELLBUTRIN SR) 150 MG 12 hr tablet Take 150 mg by mouth 2 (two) times daily.     clonazePAM (KLONOPIN) 0.5 MG tablet Take 1 tablet (0.5 mg total) by mouth 2 (two) times daily as needed for anxiety. 14 tablet 0   cloNIDine (CATAPRES) 0.2 MG tablet Take 1 tablet (0.2 mg total) by mouth 2 (two) times daily. 180 tablet 1   cyclobenzaprine (FLEXERIL) 10 MG tablet Take 1 tablet (10 mg total) by mouth 2 (two) times daily as needed. 20 tablet 0   furosemide (LASIX) 20 MG tablet Take 1 tablet (20 mg total) by mouth daily. 30 tablet 1   gabapentin (NEURONTIN) 300 MG capsule Take 1 capsule (300 mg total) by mouth 3 (three) times daily as needed (leg pain). 90 capsule 2   ipratropium-albuterol (DUONEB) 0.5-2.5 (3) MG/3ML SOLN Inhale 3 mLs into the lungs every 6 (six) hours as needed.     levofloxacin (LEVAQUIN) 750 MG tablet Take 750 mg by mouth daily.     lisinopril (ZESTRIL) 20 MG tablet Take 1 tablet (20 mg total) by mouth daily. 90 tablet 3   metoprolol tartrate (LOPRESSOR) 100 MG tablet Take 2 tablets (200 mg total) by mouth 2 (two) times daily. 360 tablet 1   naloxone (NARCAN) nasal spray 4 mg/0.1 mL PLEASE SEE ATTACHED FOR  DETAILED DIRECTIONS     nitroGLYCERIN (NITROSTAT) 0.4 MG SL tablet PLACE 1 TABLET UNDER TONGUE AS NEEDED FOR CHEST PAIN EVERY 5 MINUTES X 3 MAX DOSES.  CALL 911 IF PAIN PERSISTS 25 tablet 6   oxyCODONE-acetaminophen (PERCOCET/ROXICET) 5-325 MG tablet Take 1 tablet by mouth every 6 (six) hours as needed.     pantoprazole (PROTONIX) 40 MG tablet Take 1 tablet (40 mg total) by mouth 2 (two) times daily before a meal. 60 tablet 3   sucralfate (CARAFATE) 1 GM/10ML suspension Take 10 mLs (1 g total) by mouth 4 (four) times daily  for 10 days. 414 mL 0   XTAMPZA ER 9 MG C12A Take 1 capsule by mouth 2 (two) times daily.     allopurinol (ZYLOPRIM) 100 MG tablet Take 100 mg by mouth 2 (two) times daily. (Patient not taking: Reported on 05/25/2023)     predniSONE (DELTASONE) 20 MG tablet Take 20 mg by mouth 2 (two) times daily with a meal. (Patient not taking: Reported on 05/25/2023)         Past Medical History:  Diagnosis Date   AAA (abdominal aortic aneurysm) without rupture (HCC) 08/25/2016   Anxiety    Arthritis    Bipolar 1 disorder (HCC)    CAD (coronary artery disease)    a. 08/08/16 LAD 70%, 60% 1st diag, CTO RCA Med Rx EF 55%   Carotid artery stenosis    Right carotid endarterectomy 07/22/05 with recurrent stenosis 01/2017; known left ICA occlusion   COPD (chronic obstructive pulmonary disease) (HCC)    Crack cocaine use    Depression    Headache    Hypertension    Myocardial infarction [facility])    Right Groin Hematoma    a. 07/2016 following diagnostic cath   Stroke [facility]) 2017      Objective:     BP 107/76   Pulse 76   Ht 5\' 3"  (1.6 m)   Wt 215 lb (97.5 kg)   LMP 07/18/2011   SpO2 94% Comment: 2L C  BMI 38.09 kg/m   SpO2: 94 % (2L C) w/c bound obese wf very confused with details of care  HEENT : Oropharynx  clear /poor dentition      NECK :  without  apparent JVD/ palpable Nodes/TM    LUNGS: no acc muscle use,  Mild barrel  contour chest wall with bilateral  Distant bs  s audible wheeze and  without cough on insp or exp maneuvers      CV:  RRR  no s3 or murmur or increase in P2, and 2+ pitting both LEs  ABD:  quite obese soft and nontender with pos end  insp Hoover's  in the supine position.  No bruits or organomegaly appreciated   MS:   ext warm without deformities Or  calf tenderness, cyanosis or clubbing     SKIN: warm and dry without lesions    NEURO:  alert, approp, nl sensorium with  no motor or cerebellar deficits apparent.     I personally reviewed images and agree with radiology impression as follows:  CXR:   05/22/23  portable Mild cardiomegaly with mild vascular congestion. No focal  consolidation   BNP 05/22/23  =  751     Assessment   Acute respiratory failure with hypoxia and hypercarbia (HCC) See admit to [facility] 05/22/23 > left AMA   Did not really complete the w/u as needs echo and eventually PFTs but for now will rx as AECOPD with purulent sputum and leg swelling and high BNP c/w superimposed chf / ? Cardiac asthma as follows  1) finish levaquin, no f/u cxr needed for now as no airspace dz and this is just purulent bronchitis, not pna  2) finish prednisone for AB component   3) max mucinex dm   4) duoneb qid   5) AVOID  ACEI and high dose lopressor her (bp low, renal insuff, ? Psuedoasthma component) - see hbp a/p  6) adjust 02 to sats around 90%   Return 2 10 days with all meds in hand using a trust but verify  approach to confirm accurate Medication  Reconciliation The principal here is that until we are certain that the  patients are doing what we've asked, it makes no sense to ask them to do more.   Essential hypertension Try off lopressor 200 mg /ACEi  (refractory AB)   In the setting of respiratory symptoms of unknown etiology,  It would be preferable to use bystolic, the most beta -1  selective Beta blocker available in sample form, with bisoprolol the most selective generic choice  on the market, at least on a trial  basis, to make sure the spillover Beta 2 effects of the less specific Beta blockers are not contributing to this patient's symptoms.  >>> try bisoprolol 10 mg bid if insurance covers  ACE inhibitors are problematic in  pts with airway complaints because  even experienced pulmonologists can't always distinguish ace effects from copd/asthma.  By themselves they don't actually cause a problem, much like oxygen can't by itself start a fire, but they certainly serve as a powerful catalyst or enhancer for any "fire"  or inflammatory process in the upper airway, be it caused by an ET  tube or more commonly reflux (especially in the obese or pts with known GERD or who are on biphoshonates).    In the era of ARB near equivalency until we have a better handle on the reversibility of the airway problem, it just makes sense to avoid ACEI  entirely in the short run and then decide later, having established a level of airway control using a reasonable limited regimen, whether to add back ace but even then being very careful to observe the pt for worsening airway control and number of meds used/ needed to control symptoms.   >>> try off acei completely /indefinitely, add back valsartan if needed and CRI not problematic.         Each maintenance medication was reviewed in detail including emphasizing most importantly the difference between maintenance and prns and under what circumstances the prns are to be triggered using an action plan format where appropriate.  Total time for H and P, chart review, counseling, reviewing neb/02 device(s) and generating customized AVS unique to this office visit / same day charting = 60 min new pt eval        Cigarette smoker 4-5 min discussion re active cigarette smoking in addition to office E&M  Ask about tobacco use:   ongoing  Advise quitting   life or breath discussion as well as issue of dangerous flammability of cigs around 02  Assess willingness:  Not committed at  this point Assist in quit attempt:  Per PCP when ready Arrange follow up:   Follow up per Primary Care planned       Sandrea Hughs, MD 05/25/2023

## 2023-05-25 ENCOUNTER — Telehealth: Payer: Self-pay | Admitting: Family Medicine

## 2023-05-25 ENCOUNTER — Encounter: Payer: Self-pay | Admitting: Internal Medicine

## 2023-05-25 ENCOUNTER — Ambulatory Visit: Payer: Medicaid Other | Admitting: Internal Medicine

## 2023-05-25 VITALS — BP 107/76 | HR 76 | Ht 63.0 in | Wt 215.0 lb

## 2023-05-25 DIAGNOSIS — I10 Essential (primary) hypertension: Secondary | ICD-10-CM

## 2023-05-25 DIAGNOSIS — F17210 Nicotine dependence, cigarettes, uncomplicated: Secondary | ICD-10-CM | POA: Diagnosis not present

## 2023-05-25 DIAGNOSIS — J9602 Acute respiratory failure with hypercapnia: Secondary | ICD-10-CM

## 2023-05-25 DIAGNOSIS — J9601 Acute respiratory failure with hypoxia: Secondary | ICD-10-CM | POA: Diagnosis not present

## 2023-05-25 MED ORDER — BISOPROLOL FUMARATE 10 MG PO TABS: ORAL_TABLET | ORAL | 11 refills | Status: DC

## 2023-05-25 NOTE — Telephone Encounter (Signed)
Alivia called from [facility] [phone]. They received constance supplies and when it was faxed back over they received both pages and page 2 was blank again. Asking to refax both pages making usre page 2 is completed before faxing to [phone]

## 2023-05-25 NOTE — Assessment & Plan Note (Signed)
See admit to [facility] 05/22/23 > left AMA   Did not really complete the w/u as needs echo and eventually PFTs but for now will rx as AECOPD with purulent sputum and leg swelling and high BNP c/w superimposed chf / ? Cardiac asthma as follows  1) finish levaquin, no f/u cxr needed for now as no airspace dz and this is just purulent bronchitis, not pna  2) finish prednisone for AB component   3) max mucinex dm   4) duoneb qid   5) AVOID  ACEI and high dose lopressor her (bp low, renal insuff, ? Psuedoasthma component) - see hbp a/p  6) adjust 02 to sats around 90%   Return 2 10 days with all meds in hand using a trust but verify approach to confirm accurate Medication  Reconciliation The principal here is that until we are certain that the  patients are doing what we've asked, it makes no sense to ask them to do more.

## 2023-05-25 NOTE — Telephone Encounter (Signed)
 Called pt, line still rings busy

## 2023-05-25 NOTE — Assessment & Plan Note (Signed)
4-5 min discussion re active cigarette smoking in addition to office E&M  Ask about tobacco use:   ongoing  Advise quitting   life or breath discussion as well as issue of dangerous flammability of cigs around 02  Assess willingness:  Not committed at this point Assist in quit attempt:  Per PCP when ready Arrange follow up:   Follow up per Primary Care planned

## 2023-05-25 NOTE — Assessment & Plan Note (Addendum)
Try off lopressor 200 mg /ACEi  (refractory AB)   In the setting of respiratory symptoms of unknown etiology,  It would be preferable to use bystolic, the most beta -1  selective Beta blocker available in sample form, with bisoprolol the most selective generic choice  on the market, at least on a trial basis, to make sure the spillover Beta 2 effects of the less specific Beta blockers are not contributing to this patient's symptoms.  >>> try bisoprolol 10 mg bid if insurance covers  ACE inhibitors are problematic in  pts with airway complaints because  even experienced pulmonologists can't always distinguish ace effects from copd/asthma.  By themselves they don't actually cause a problem, much like oxygen can't by itself start a fire, but they certainly serve as a powerful catalyst or enhancer for any "fire"  or inflammatory process in the upper airway, be it caused by an ET  tube or more commonly reflux (especially in the obese or pts with known GERD or who are on biphoshonates).    In the era of ARB near equivalency until we have a better handle on the reversibility of the airway problem, it just makes sense to avoid ACEI  entirely in the short run and then decide later, having established a level of airway control using a reasonable limited regimen, whether to add back ace but even then being very careful to observe the pt for worsening airway control and number of meds used/ needed to control symptoms.   >>> try off acei completely /indefinitely, add back valsartan if needed and CRI not problematic.         Each maintenance medication was reviewed in detail including emphasizing most importantly the difference between maintenance and prns and under what circumstances the prns are to be triggered using an action plan format where appropriate.  Total time for H and P, chart review, counseling, reviewing neb/02 device(s) and generating customized AVS unique to this office visit / same day charting =  60 min new pt eval

## 2023-05-25 NOTE — Patient Instructions (Addendum)
Duoneb (Ipatropium-albuterol) one treatment at  bfast lunch supper and bedtime   Oxygen needs to be adjusted to keep you level above 90% on your finger   For cough/ congestion > Mucinex dm  up to maximum of  1200 mg every 12 hours as needed   Levaquin once daily until finish it.   Go ahead and take the prednisone per bottle until finish it   Stop lisinopril and metaprolol   Start bisoprolol 10 mg twice daily (metaprolol replacement so ok to resume if bisoprolol not available)   The key is to stop smoking completely before smoking completely stops you!        Please schedule a follow up office visit on 9/12 /24  call sooner if needed with all medications /inhalers/ solutions in hand so we can verify exactly what you are taking. This includes all medications from all doctors and over the counters

## 2023-05-26 ENCOUNTER — Other Ambulatory Visit (HOSPITAL_COMMUNITY): Payer: Self-pay

## 2023-05-26 ENCOUNTER — Telehealth: Payer: Self-pay

## 2023-05-26 NOTE — Telephone Encounter (Signed)
*  Pulm  Pharmacy Patient Advocate Encounter  Received notification from [facility] that Prior Authorization for Bisoprolol Fumarate 10MG  tablets  has been Approved from 05/26/2023-05/24/2024  PA #/Case ID/Reference #: [id number]

## 2023-05-26 NOTE — Telephone Encounter (Signed)
Called pt, line rings busy. Letter mailed to call

## 2023-05-27 ENCOUNTER — Ambulatory Visit (INDEPENDENT_AMBULATORY_CARE_PROVIDER_SITE_OTHER)
Admission: RE | Admit: 2023-05-27 | Discharge: 2023-05-27 | Disposition: A | Discharge status: Home / Self Care | Payer: Medicaid Other | Source: Ambulatory Visit | Attending: Vascular Surgery | Admitting: Vascular Surgery

## 2023-05-27 ENCOUNTER — Ambulatory Visit: Payer: Medicaid Other | Admitting: Vascular Surgery

## 2023-05-27 ENCOUNTER — Encounter: Payer: Self-pay | Admitting: Vascular Surgery

## 2023-05-27 ENCOUNTER — Ambulatory Visit (HOSPITAL_COMMUNITY)
Admission: RE | Admit: 2023-05-27 | Discharge: 2023-05-27 | Disposition: A | Discharge status: Home / Self Care | Payer: Medicaid Other | Source: Ambulatory Visit | Attending: Vascular Surgery | Admitting: Vascular Surgery

## 2023-05-27 VITALS — BP 132/82 | HR 73 | Temp 97.9°F | Resp 20 | Ht 63.0 in | Wt 215.0 lb

## 2023-05-27 DIAGNOSIS — I6523 Occlusion and stenosis of bilateral carotid arteries: Secondary | ICD-10-CM | POA: Insufficient documentation

## 2023-05-27 DIAGNOSIS — I739 Peripheral vascular disease, unspecified: Secondary | ICD-10-CM | POA: Insufficient documentation

## 2023-05-27 DIAGNOSIS — I7143 Infrarenal abdominal aortic aneurysm, without rupture: Secondary | ICD-10-CM

## 2023-05-27 LAB — VAS US ABI WITH/WO TBI
Left ABI: 0.73
Right ABI: 0.89

## 2023-05-27 NOTE — Progress Notes (Signed)
REASON FOR VISIT:   Follow-up of multiple vascular issues  MEDICAL ISSUES:   ABDOMINAL AORTIC ANEURYSM: The maximum diameter of her infrarenal aorta is 4.7 cm.  This has not changed significantly compared to 4.5 cm in February of this year.  She does continue to smoke.  We have again discussed the importance of tobacco cessation.  She understands that continued tobacco use does increase her risk of aneurysm expansion and rupture.  She understands we would typically not consider elective repair of an aneurysm unless it reaches 5.5 cm in a normal risk patient.  I have ordered a follow-up duplex in 9 months.  She understands that I will be retiring so she will be seen on the PA schedule at that time.  STATUS POST RIGHT CAROTID ENDARTERECTOMY: This patient underwent a right carotid endarterectomy in 2019.  Her carotid endarterectomy site is widely patent.  She has a known left common carotid artery occlusion.  We normally follow this on a yearly basis but I will get this done when she comes in for her 32-month abdominal aortic aneurysm study.  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  Her ABIs are stable.  ABIs 89% on the right and 73% on the left.  Her activity currently is very limited.  We will continue to follow this closely.  I did not order ABIs in 9 months.  We can readjust her routine follow-up studies at her next visit.  She is on aspirin and is on a statin.  HPI:   [name] is a pleasant 62 y.o. female who I last saw on 11/12/2022 with multiple vascular issues.  At that time she had a 4.5 cm infrarenal abdominal aortic aneurysm.  I recommended a follow-up visit in 9 months.  She felt strongly about seeing me before I retired so she comes in a little earlier.  She also had a history of a previous right carotid endarterectomy in 2019 with a known common carotid artery occlusion on the left.  At the time of her last visit the right  carotid endarterectomy site was widely patent.  In addition she had evidence of infrainguinal arterial occlusive disease so she had ABIs ordered also.  Unfortunately, she just lost her daughter.  She does have family support locally.  She denies any abdominal pain or back pain.  She denies any history of stroke, TIAs, expressive or receptive aphasia, or amaurosis fugax.  I do not get any history of claudication however her activity is very limited.  She is in the chair quite a bit.  She has bilateral lower extremity swelling.  She denies any history of rest pain or nonhealing ulcers.  She does continue to smoke and is now smoking about a pack per day.  Past Medical History:  Diagnosis Date   AAA (abdominal aortic aneurysm) without rupture (HCC) 08/25/2016   Anxiety    Arthritis    Bipolar 1 disorder (HCC)    CAD (coronary artery disease)    a. 08/08/16 LAD 70%, 60% 1st diag, CTO RCA Med Rx EF 55%   Carotid artery stenosis    Right carotid endarterectomy 07/22/05 with recurrent stenosis 01/2017; known left ICA occlusion   COPD (chronic obstructive pulmonary disease) (HCC)    Crack cocaine use    Depression    Headache    Hypertension    Myocardial infarction [facility])    Right Groin Hematoma    a. 07/2016 following diagnostic cath   Stroke (  HCC) 2017    Family History  Problem Relation Age of Onset   Heart attack Mother    Stroke Mother    Hypertension Mother    Diabetes Mother    Heart disease Mother    Heart attack Father    Hypertension Father    Heart disease Father    Cancer Maternal Aunt    Cancer Maternal Uncle    COPD Paternal Uncle        2 pat uncles with COPD   Cancer Paternal Uncle        1 pat uncle with cancer   Colon cancer Neg Hx     SOCIAL HISTORY: Social History   Tobacco Use   Smoking status: Every Day    Current packs/day: 0.50    Average packs/day: 0.5 packs/day for 48.6 years (24.3 ttl pk-yrs)    Types: Cigarettes    Start date: 10/05/1974    Smokeless tobacco: Never  Substance Use Topics   Alcohol use: No    Allergies  Allergen Reactions   Penicillins Hives and Swelling    PATIENT HAS HAD A PCN REACTION WITH IMMEDIATE RASH, FACIAL/TONGUE/THROAT SWELLING, SOB, OR LIGHTHEADEDNESS WITH HYPOTENSION:  #  #  YES  #  # HAS PT DEVELOPED SEVERE RASH INVOLVING MUCUS MEMBRANES or SKIN NECROSIS: #  #  YES  #  # Has patient had a PCN reaction that required hospitalization No Has patient had a PCN reaction occurring within the last 10 years: Non If all of the above answers are "NO", then may proceed with Cephalosporin use.     Current Outpatient Medications  Medication Sig Dispense Refill   albuterol (VENTOLIN HFA) 108 (90 Base) MCG/ACT inhaler Inhale 2 puffs into the lungs every 6 (six) hours as needed for wheezing or shortness of breath. 18 g 2   amLODipine (NORVASC) 10 MG tablet Take 1 tablet (10 mg total) by mouth daily. 90 tablet 1   aspirin EC 81 MG tablet Take 81 mg by mouth daily. Swallow whole.     atorvastatin (LIPITOR) 80 MG tablet Take 1 tablet (80 mg total) by mouth daily. 90 tablet 1   bisoprolol (ZEBETA) 10 MG tablet One twice daily 60 tablet 11   buPROPion (WELLBUTRIN SR) 150 MG 12 hr tablet Take 150 mg by mouth 2 (two) times daily.     clonazePAM (KLONOPIN) 0.5 MG tablet Take 1 tablet (0.5 mg total) by mouth 2 (two) times daily as needed for anxiety. 14 tablet 0   cloNIDine (CATAPRES) 0.2 MG tablet Take 1 tablet (0.2 mg total) by mouth 2 (two) times daily. 180 tablet 1   cyclobenzaprine (FLEXERIL) 10 MG tablet Take 1 tablet (10 mg total) by mouth 2 (two) times daily as needed. 20 tablet 0   furosemide (LASIX) 20 MG tablet Take 1 tablet (20 mg total) by mouth daily. 30 tablet 1   gabapentin (NEURONTIN) 300 MG capsule Take 1 capsule (300 mg total) by mouth 3 (three) times daily as needed (leg pain). 90 capsule 2   ipratropium-albuterol (DUONEB) 0.5-2.5 (3) MG/3ML SOLN Inhale 3 mLs into the lungs every 6 (six) hours as  needed.     levofloxacin (LEVAQUIN) 750 MG tablet Take 750 mg by mouth daily.     naloxone (NARCAN) nasal spray 4 mg/0.1 mL PLEASE SEE ATTACHED FOR DETAILED DIRECTIONS     nitroGLYCERIN (NITROSTAT) 0.4 MG SL tablet PLACE 1 TABLET UNDER TONGUE AS NEEDED FOR CHEST PAIN EVERY 5 MINUTES X 3 MAX DOSES.  CALL 911 IF PAIN PERSISTS 25 tablet 6   oxyCODONE-acetaminophen (PERCOCET/ROXICET) 5-325 MG tablet Take 1 tablet by mouth every 6 (six) hours as needed.     pantoprazole (PROTONIX) 40 MG tablet Take 1 tablet (40 mg total) by mouth 2 (two) times daily before a meal. 60 tablet 3   predniSONE (DELTASONE) 20 MG tablet Take 20 mg by mouth 2 (two) times daily with a meal.     sucralfate (CARAFATE) 1 GM/10ML suspension Take 10 mLs (1 g total) by mouth 4 (four) times daily for 10 days. 414 mL 0   XTAMPZA ER 9 MG C12A Take 1 capsule by mouth 2 (two) times daily.     No current facility-administered medications for this visit.    REVIEW OF SYSTEMS:  [X]  denotes positive finding, [ ]  denotes negative finding Cardiac  Comments:  Chest pain or chest pressure:    Shortness of breath upon exertion: x   Short of breath when lying flat:    Irregular heart rhythm:        Vascular    Pain in calf, thigh, or hip brought on by ambulation:    Pain in feet at night that wakes you up from your sleep:     Blood clot in your veins:    Leg swelling:         Pulmonary    Oxygen at home:    Productive cough:     Wheezing:         Neurologic    Sudden weakness in arms or legs:     Sudden numbness in arms or legs:     Sudden onset of difficulty speaking or slurred speech:    Temporary loss of vision in one eye:     Problems with dizziness:         Gastrointestinal    Blood in stool:     Vomited blood:         Genitourinary    Burning when urinating:     Blood in urine:        Psychiatric    Major depression:         Hematologic    Bleeding problems:    Problems with blood clotting too easily:         Skin    Rashes or ulcers:        Constitutional    Fever or chills:     PHYSICAL EXAM:   Vitals:   05/27/23 1045 05/27/23 1048  BP: 134/84 132/82  Pulse: 73   Resp: 20   Temp: 97.9 F (36.6 C)   SpO2: 93%   Weight: 215 lb (97.5 kg)   Height: 5\' 3"  (1.6 m)    Body mass index is 38.09 kg/m.  GENERAL: The patient is a well-nourished female, in no acute distress. The vital signs are documented above. CARDIAC: There is a regular rate and rhythm.  VASCULAR: I do not detect carotid bruits. Is difficult to assess her femoral pulses as she is in a wheelchair. I cannot palpate pedal pulses. She has bilateral lower extremity swelling. PULMONARY: There is good air exchange bilaterally without wheezing or rales. ABDOMEN: Soft and non-tender with normal pitched bowel sounds.  I do not palpate her aneurysm given her body habitus. MUSCULOSKELETAL: There are no major deformities or cyanosis. NEUROLOGIC: No focal weakness or paresthesias are detected. SKIN: There are no ulcers or rashes noted. PSYCHIATRIC: The patient has a normal affect.  DATA:    CAROTID  DUPLEX: I have independently interpreted her carotid duplex scan today.  On the left side she has a known common carotid artery occlusion.  The left vertebral artery is patent with antegrade flow.  On the right side the right carotid endarterectomy site is widely patent without evidence of restenosis.  The right vertebral artery is patent with antegrade flow.  ARTERIAL DOPPLER STUDY: I have independently interpreted her arterial Doppler study today.  On the right side there is a monophasic posterior tibial signal with a biphasic dorsalis pedis signal.  ABIs 89%.  Toe pressures 82 mmHg.  On the left side there is a monophasic posterior tibial and dorsalis pedis signal.  ABI 73%.  Toe pressure 74 mmHg.  DUPLEX ABDOMINAL AORTA: I have independently interpreted her duplex of the abdominal aorta today.  The patient has a fusiform  aneurysm which measures 4.7 cm in maximum diameter.  In February of this year was 4.5 cm in maximum diameter.   [name] [facility] [phone]

## 2023-05-27 NOTE — Telephone Encounter (Signed)
They will have to refax the form because its gone for scanning now

## 2023-05-28 ENCOUNTER — Other Ambulatory Visit: Payer: Self-pay | Admitting: Family Medicine

## 2023-05-28 ENCOUNTER — Telehealth: Payer: Self-pay | Admitting: Radiology

## 2023-05-28 DIAGNOSIS — Z634 Disappearance and death of family member: Secondary | ICD-10-CM

## 2023-05-28 NOTE — Telephone Encounter (Signed)
Called and will refax the forms.

## 2023-05-28 NOTE — Telephone Encounter (Signed)
Patient called the [facility] office and LMVM x 2 asking for a call back.  I tried to call, NA, no VM.  Please try to call her back Tuesday upon return to the office?  Thanks.

## 2023-05-31 ENCOUNTER — Emergency Department (HOSPITAL_COMMUNITY)
Admission: EM | Admit: 2023-05-31 | Discharge: 2023-05-31 | Disposition: A | Discharge status: Home / Self Care | Payer: Medicaid Other | Attending: Emergency Medicine | Admitting: Emergency Medicine

## 2023-05-31 ENCOUNTER — Emergency Department (HOSPITAL_COMMUNITY): Payer: Medicaid Other

## 2023-05-31 ENCOUNTER — Other Ambulatory Visit: Payer: Self-pay

## 2023-05-31 ENCOUNTER — Encounter (HOSPITAL_COMMUNITY): Payer: Self-pay

## 2023-05-31 DIAGNOSIS — Z7982 Long term (current) use of aspirin: Secondary | ICD-10-CM | POA: Diagnosis not present

## 2023-05-31 DIAGNOSIS — I10 Essential (primary) hypertension: Secondary | ICD-10-CM | POA: Diagnosis not present

## 2023-05-31 DIAGNOSIS — I2510 Atherosclerotic heart disease of native coronary artery without angina pectoris: Secondary | ICD-10-CM | POA: Diagnosis not present

## 2023-05-31 DIAGNOSIS — S59912A Unspecified injury of left forearm, initial encounter: Secondary | ICD-10-CM | POA: Insufficient documentation

## 2023-05-31 DIAGNOSIS — S8992XA Unspecified injury of left lower leg, initial encounter: Secondary | ICD-10-CM | POA: Diagnosis present

## 2023-05-31 DIAGNOSIS — J449 Chronic obstructive pulmonary disease, unspecified: Secondary | ICD-10-CM | POA: Insufficient documentation

## 2023-05-31 DIAGNOSIS — M545 Low back pain, unspecified: Secondary | ICD-10-CM | POA: Insufficient documentation

## 2023-05-31 DIAGNOSIS — W19XXXA Unspecified fall, initial encounter: Secondary | ICD-10-CM | POA: Diagnosis not present

## 2023-05-31 DIAGNOSIS — S59902A Unspecified injury of left elbow, initial encounter: Secondary | ICD-10-CM | POA: Diagnosis not present

## 2023-05-31 DIAGNOSIS — Z79899 Other long term (current) drug therapy: Secondary | ICD-10-CM | POA: Insufficient documentation

## 2023-05-31 MED ORDER — HYDROMORPHONE HCL 1 MG/ML IJ SOLN
1.0000 mg | Freq: Once | INTRAMUSCULAR | Status: AC
  Administered 2023-05-31: 1 mg via INTRAMUSCULAR
  Filled 2023-05-31: qty 1

## 2023-05-31 MED ORDER — OXYCODONE-ACETAMINOPHEN 5-325 MG PO TABS: 1.0000 | ORAL_TABLET | Freq: Four times a day (QID) | ORAL | 0 refills | Status: AC | PRN

## 2023-05-31 NOTE — ED Triage Notes (Signed)
Pt reports:  Left side pain Leg, arm Back pain Has not had prescription filled but her MD was unable to fill it

## 2023-05-31 NOTE — ED Provider Notes (Signed)
[place] EMERGENCY DEPARTMENT AT [facility] Provider Note   CSN: [id number] Arrival date & time: 05/31/23  1359     History  Chief Complaint  Patient presents with   Leg Pain   Back Pain    [name] is a 62 y.o. female.   Leg Pain Associated symptoms: back pain   Associated symptoms: no fever and no neck pain   Back Pain Associated symptoms: leg pain   Associated symptoms: no abdominal pain, no chest pain, no dysuria, no fever, no numbness and no weakness         [name] is a 62 y.o. female with past medical history of spinal stenosis chronic back pain, hypertension, coronary artery disease, COPD who presents to the Emergency Department complaining of worsening of her ongoing low back pain.  States that she sees pain management but missed her last appointment.  Has ran out of her pain medication.  Describes worsening pain of her lower back radiating into her left leg.  She also has some pain of her left knee, forearm and elbow area secondary to remote injury.  Pain worsens with movement and improves at rest.  She denies any recent injury, numbness or weakness of her extremities, abdominal pain, fever, urine or bowel changes.   Home Medications Prior to Admission medications   Medication Sig Start Date End Date Taking? Authorizing Provider  albuterol (VENTOLIN HFA) 108 (90 Base) MCG/ACT inhaler Inhale 2 puffs into the lungs every 6 (six) hours as needed for wheezing or shortness of breath. 04/28/23   Del Newman Nip, Tenna Child, FNP  amLODipine (NORVASC) 10 MG tablet Take 1 tablet (10 mg total) by mouth daily. 04/28/23   Del Nigel Berthold, FNP  aspirin EC 81 MG tablet Take 81 mg by mouth daily. Swallow whole.    [provider]  atorvastatin (LIPITOR) 80 MG tablet Take 1 tablet (80 mg total) by mouth daily. 04/28/23   Del Newman Nip, Tenna Child, FNP  bisoprolol (ZEBETA) 10 MG tablet One twice daily 05/25/23   Nyoka Cowden, MD  buPROPion  [facility] SR) 150 MG 12 hr tablet Take 150 mg by mouth 2 (two) times daily. 05/20/23   [provider]  clonazePAM (KLONOPIN) 0.5 MG tablet Take 1 tablet (0.5 mg total) by mouth 2 (two) times daily as needed for anxiety. 05/21/23   Del Nigel Berthold, FNP  cloNIDine (CATAPRES) 0.2 MG tablet Take 1 tablet (0.2 mg total) by mouth 2 (two) times daily. 04/28/23   Del Nigel Berthold, FNP  cyclobenzaprine (FLEXERIL) 10 MG tablet Take 1 tablet (10 mg total) by mouth 2 (two) times daily as needed. 04/28/23   Del Nigel Berthold, FNP  furosemide (LASIX) 20 MG tablet Take 1 tablet (20 mg total) by mouth daily. 04/28/23   Del Nigel Berthold, FNP  gabapentin (NEURONTIN) 300 MG capsule Take 1 capsule (300 mg total) by mouth 3 (three) times daily as needed (leg pain). 04/28/23 07/27/23  Del Nigel Berthold, FNP  ipratropium-albuterol (DUONEB) 0.5-2.5 (3) MG/3ML SOLN Inhale 3 mLs into the lungs every 6 (six) hours as needed. 05/23/23 06/22/23  [provider]  naloxone [facility]) nasal spray 4 mg/0.1 mL PLEASE SEE ATTACHED FOR DETAILED DIRECTIONS 04/21/23   [provider]  nitroGLYCERIN (NITROSTAT) 0.4 MG SL tablet PLACE 1 TABLET UNDER TONGUE AS NEEDED FOR CHEST PAIN EVERY 5 MINUTES X 3 MAX DOSES.  CALL 911 IF PAIN PERSISTS 04/29/23   Lonna Cobb  Henreitta Leber, FNP  oxyCODONE-acetaminophen (PERCOCET/ROXICET) 5-325 MG tablet Take 1 tablet by mouth every 6 (six) hours as needed. 05/07/23   [provider]  pantoprazole (PROTONIX) 40 MG tablet Take 1 tablet (40 mg total) by mouth 2 (two) times daily before a meal. 05/18/23   Tiffany Kocher, PA-C  sucralfate (CARAFATE) 1 GM/10ML suspension Take 10 mLs (1 g total) by mouth 4 (four) times daily for 10 days. 05/18/23 05/28/23  Tiffany Kocher, PA-C  XTAMPZA ER 9 MG C12A Take 1 capsule by mouth 2 (two) times daily. 05/07/23   [provider]      Allergies    Penicillins    Review of Systems   Review of Systems   Constitutional:  Negative for chills and fever.  Respiratory:  Negative for shortness of breath.   Cardiovascular:  Negative for chest pain.  Gastrointestinal:  Negative for abdominal pain.  Genitourinary:  Negative for difficulty urinating and dysuria.  Musculoskeletal:  Positive for arthralgias (Left forearm, knee and elbow pain) and back pain. Negative for neck pain.  Skin:  Negative for color change and rash.  Neurological:  Negative for dizziness, weakness and numbness.    Physical Exam Updated Vital Signs BP (!) 155/101 (BP Location: Right Arm)   Pulse 75   Temp 97.8 F (36.6 C) (Oral)   Resp 16   Ht 5\' 3"  (1.6 m)   Wt 97.5 kg   LMP 07/18/2011   SpO2 93%   BMI 38.09 kg/m  Physical Exam Vitals and nursing note reviewed.  Constitutional:      General: She is not in acute distress.    Appearance: Normal appearance.  Cardiovascular:     Rate and Rhythm: Normal rate and regular rhythm.     Pulses: Normal pulses.  Pulmonary:     Effort: Pulmonary effort is normal.  Abdominal:     Palpations: Abdomen is soft.     Tenderness: There is no abdominal tenderness.  Musculoskeletal:        General: Tenderness present. No swelling.     Comments: Mild pain on range of motion of the left elbow.  No edema erythema or excessive warmth.  No bony deformities.  Neurovascularly intact.  Tender to palpation left lower lumbar's paraspinal muscles and left SI joint space area.  Full range of motion of the left hip.  Some mild crepitus on range of motion of the left knee.  No palpable effusion or step-off deformities.  No obvious laxity of the joint.  Skin:    General: Skin is warm.     Capillary Refill: Capillary refill takes less than 2 seconds.  Neurological:     General: No focal deficit present.     Mental Status: She is alert.     Sensory: No sensory deficit.     Motor: No weakness.     ED Results / Procedures / Treatments   Labs (all labs ordered are listed, but only  abnormal results are displayed) Labs Reviewed - No data to display  EKG None  Radiology DG Knee Complete 4 Views Left  Result Date: 05/31/2023 CLINICAL DATA:  Left knee pain after a fall EXAM: LEFT KNEE - COMPLETE 4+ VIEW COMPARISON:  12/09/2022 FINDINGS: Again identified is 3 compartment osteoarthritis, including joint space narrowing and osteophyte formation. Most significant in the medial and less so patellofemoral compartments. No acute fracture or dislocation. Vascular calcifications. No joint effusion. Osteopenia mildly limits exam. IMPRESSION: 3 compartment osteoarthritis, without acute osseous abnormality.  Electronically Signed   By: Jeronimo Greaves M.D.   On: 05/31/2023 18:44    Procedures Procedures    Medications Ordered in ED Medications  HYDROmorphone (DILAUDID) injection 1 mg (has no administration in time range)    ED Course/ Medical Decision Making/ A&P                                 Medical Decision Making Patient here with likely acute on chronic pain and requesting refill of her pain medication.  Currently sees pain management but has run out of her medication due to missed appointment.  Denies any new or worsening symptoms.  No saddle anesthesias on exam.  Elbow and knee pain felt to be related to musculoskeletal pain secondary to a remote fall  I suspect this is related to acute on chronic pain.  There are no red flags suggestive of cauda equina.  No indications for septic joint  Amount and/or Complexity of Data Reviewed Radiology: ordered.    Details: X-ray of the left knee shows 3 compartment osteoarthritis without acute bony finding Discussion of management or test interpretation with external provider(s): Patient here for questing medication refill for her narcotic pain medication.  Patient currently under care of pain management.  Database was reviewed.  Pain was addressed here with injection.  X-ray of the knee without acute process.  Patient offered knee brace  for support and she declined stating that she has 1 at home.  I explained to the patient that her pain was addressed here and she will be provided a prepack for home use, but she will need to follow-up with her pain management provider for further medication management.  She verbalized understanding   Risk Prescription drug management.           Final Clinical Impression(s) / ED Diagnoses Final diagnoses:  Injury of left knee, initial encounter    Rx / DC Orders ED Discharge Orders     None         Pauline Aus, PA-C 06/04/23 1552    Vanetta Mulders, MD 06/15/23 [phone]

## 2023-05-31 NOTE — ED Notes (Addendum)
Pt requesting something for pain. Explained the ED provider would need to evaluate her first, before pain meds could be administered.

## 2023-05-31 NOTE — ED Notes (Signed)
Fall risk band placed on pt

## 2023-05-31 NOTE — Discharge Instructions (Signed)
I recommend that you wear your knee brace when walking or standing for added support.  Please follow-up with your primary care provider or with your pain management provider

## 2023-05-31 NOTE — ED Notes (Signed)
Pt again requesting something for pain. Has not yet been evaluated by ED provider.

## 2023-05-31 NOTE — ED Notes (Signed)
ED Provider at bedside. 

## 2023-05-31 NOTE — ED Notes (Signed)
Radiology to room to complete xray's of left knee.

## 2023-06-01 MED FILL — Oxycodone w/ Acetaminophen Tab 5-325 MG: ORAL | Qty: 6 | Status: AC

## 2023-06-02 ENCOUNTER — Telehealth: Payer: Self-pay | Admitting: Internal Medicine

## 2023-06-02 NOTE — Telephone Encounter (Signed)
Patient is calling wanting to know if she has COPD. She was seen last week in office. Please call and advise [phone].

## 2023-06-03 ENCOUNTER — Other Ambulatory Visit: Payer: Self-pay | Admitting: Family Medicine

## 2023-06-07 ENCOUNTER — Encounter: Payer: Self-pay | Admitting: Emergency Medicine

## 2023-06-07 NOTE — Telephone Encounter (Signed)
Per recent OV notes there is no mention of COPD. Per medical history there is indication of "History of COPD": Last Encounter with History of COPD  Visit Information  Encounter Information  Provider Department Center  05/06/2023 [facility] Newman Nip, FNP [facility]   Diagnoses    Codes Comments  History of COPD  - Primary Z87.09     Not sure if this is what patient is referring to.   Please advise if patient currently has COPD. Thank you!

## 2023-06-08 NOTE — Telephone Encounter (Signed)
Called and spoke w/ pt informing of this information, and to follow  the plan on her AVS , pt stated that she she understood. She mentioned that she is still smoking.

## 2023-06-08 NOTE — Telephone Encounter (Signed)
Cannot establish dx s pfts  - I suspect there is some element of copd base on smoking history and main take home message for now is follow AVS / avoid all smoke exp/ keep f/u as planned to complete the work up

## 2023-06-10 NOTE — Progress Notes (Deleted)
[name], female    DOB: November 08, 1960    MRN: [id number]   Brief patient profile:  11  yowf  active smoker  referred to pulmonary clinic in [place]  05/25/2023 by [facility] NP  for new onset resp failure no longer fully ambulatory due to L Knee /back pain s/p admit/ d/c ama:  Admit date: 05/22/2023 Discharge date: 05/23/2023   Discharge Diagnoses:  Acute respiratory failure with hypoxia and hypercarbia (CMS-HCC) Tobacco abuse Leukocytosis CAP (community acquired pneumonia) AKI (acute kidney injury) (CMS-HCC)  Hospital Course:  [name] is a 62 y.o. female that presented to [facility] and was admitted for Acute respiratory failure with hypoxia and hypercarbia (CMS-HCC) on 05/22/2023 6:19 PM .  Please refer to H&P for full history.  In summary, the patient presented to the ER with a complaint of acute weakness. The patient was at the funeral home for her daughters viewing after she passed unexpectedly earlier this week. Per H&P it is reported that patient suddenly felt weak in bilateral legs and arms. She reported having trouble standing or holding anything. It was reported to the ED nurse that patient was dyspneic. Patient denies any recent cough or fever. She does smoke half a pack of cigarettes per day. Patient reported that she was in the process of being evaluated for COPD outpatient. Patient also has chronic swelling in both of her lower legs but unclear on how long this has been occurring.  Patient was admitted for acute hypoxic/hypercarbic respiratory failure. She was started on BiPAP in the ED. Checks x-ray notable for mild cardiomegaly or/vascular congestion. Patient had a normal echo in 2023. An order was initiated for repeat echocardiogram given potential for Takotsubo. Patient was given a dose of IV Lasix. Given her hypercarbia, history of tobacco use symptoms likely COPD. Patient was started on bronchodilators, systemic steroids and covered with  empiric CAP antibiotics with Levaquin and aztreonam given penicillin allergy. Patient noted to have leukocytosis. Procalcitonin was 0.10  Patient also noted to have AKI with a creatinine of 1.1 back in 6/23. On admission 1.96 patient was noted to be hypotensive initially on presentation which could be underlying etiology. Home lisinopril was held at time of admission. Blood pressure improved. On 05/23/2023 patient's creatinine 1.37.  Hypotension. Patient initially hypotensive but improved throughout hospitalization. Patient was restarted on clonidine and Norvasc but lisinopril and metoprolol held. Hypotension not thought to be related to sepsis. Suspected this was coupled with grief reaction along with benzodiazepines and chronic opioid use.  Anxiety/depression. Patient takes Klonopin as needed. Which was resumed throughout her hospitalization.  Odynophagia. Recently started on twice daily PPI/Carafate per GI with plans for outpatient EGD in the coming weeks. Patient given PPI and Carafate during hospital stay.  Patient has a history of chronic pain in her home chronic pain medication was given while in the hospital.  Patient was counseled on tobacco cessation.  Unfortunately, patient left AMA. Patient was adamant that she wanted to go home. Patient was very emotional, tearful, and upset over the passing of her daughter. I explained that our recommendation is that she stay for ongoing treatment as she had audible wheezing, shortness of breath, cough, and supplemental oxygen 2 L nasal cannula.  Patient was explained the risks of leaving AMA which included rehospitalization, poor outcome including death. I spoke to [name] who is at her home with her son who is hard of hearing and explained this to her care with the patient's permission.  Patient  will be given prescription for Levaquin, prednisone, nebulizer machine, and DuoNebs.  Condition at Discharge: stable Discharge Medications:  START taking  these medications   ipratropium-albuterol 0.5-2.5 mg/3 mL nebulizer Commonly known as: DUO-NEB Inhale 3 mL by nebulization every four (4) hours as needed.  levoFLOXacin 750 MG tablet Commonly known as: LEVAQUIN Take 1 tablet (750 mg total) by mouth daily for 5 days.  nebulizer and compressor Devi 1 each by Miscellaneous route in the morning.  predniSONE 20 MG tablet Commonly known as: DELTASONE Take 2 tablets (40 mg total) by mouth daily for 5 days.     CONTINUE taking these medications   albuterol 90 mcg/actuation inhaler Commonly known as: PROVENTIL HFA;VENTOLIN HFA Inhale 2 puffs every six (6) hours as needed.  allopurinol 100 MG tablet Commonly known as: ZYLOPRIM Take 1 tablet (100 mg total) by mouth Two (2) times a day (at 8am and 12:00).  amlodipine 10 MG tablet Commonly known as: NORVASC Take 1 tablet (10 mg total) by mouth daily.  aspirin 81 MG tablet Commonly known as: ECOTRIN Take 1 tablet (81 mg total) by mouth daily.  atorvastatin 80 MG tablet Commonly known as: LIPITOR Take 1 tablet (80 mg total) by mouth daily.  clonazePAM 0.5 MG tablet Commonly known as: KlonoPIN Take 1 tablet (0.5 mg total) by mouth two (2) times a day as needed.  cloNIDine HCL 0.2 MG tablet Commonly known as: CATAPRES Take 1 tablet (0.2 mg total) by mouth two (2) times a day.  cyclobenzaprine 10 MG tablet Commonly known as: FLEXERIL Take 1 tablet (10 mg total) by mouth two (2) times a day as needed.  gabapentin 300 MG capsule Commonly known as: NEURONTIN Take 1 capsule (300 mg total) by mouth Three (3) times a day.  gabapentin 300 MG capsule Commonly known as: NEURONTIN Take 1 capsule (300 mg total) by mouth Three (3) times a day.  lisinopril 40 MG tablet Commonly known as: PRINIVIL,ZESTRIL Take 1 tablet (40 mg total) by mouth daily.  metoPROLOL tartrate 100 MG tablet Commonly known as: Lopressor Take 1 tablet (100 mg total) by mouth two (2) times a  day.  oxyCODONE-acetaminophen 5-325 mg per tablet Commonly known as: PERCOCET Take 1 tablet by mouth every four (4) hours as needed for pain.  pantoprazole 40 MG tablet Commonly known as: Protonix Take 1 tablet (40 mg total) by mouth two (2) times a day.  XTAMPZA ER 9 mg Cspt 12 hr ER capsule sprinkle Generic drug: oxyCODONE Take 1 capsule (9 mg total) by mouth two (2) times a day.     History of Present Illness  05/25/2023  Pulmonary/ 1st office eval/ Shinita Mac / [place] [facility]  Levaquin D#1 / has not started prednisone  Chief Complaint  Patient presents with   Establish Care   Shortness of Breath  Dyspnea:  ok at rest  Cough: variably yellow - not taken levaquin on day of ov  Sleep: 30 degrees  SABA use: has nebulizer once a day prior to OV  / has hfa avg twice daily but not used on day of ov  02: 2lpm but can't tol NP  Rec Duoneb (Ipatropium-albuterol) one treatment at  bfast lunch supper and bedtime  Oxygen needs to be adjusted to keep you level above 90% on your finger  For cough/ congestion > Mucinex dm  up to maximum of  1200 mg every 12 hours as needed  Levaquin once daily until finish it.  Go ahead and take the prednisone per bottle  until finish it  Stop lisinopril and metaprolol  Start bisoprolol 10 mg twice daily (metaprolol replacement so ok to resume if bisoprolol not available)  The key is to stop smoking completely before smoking completely stops you!  Please schedule a follow up office visit on 9/12 /24  call sooner if needed with all medications /inhalers/ solutions in hand     06/11/2023  f/u ov/[place] office/Oma Alpert re: new onset 02 dep resp failure maint on *** did *** bring meds  No chief complaint on file.   Dyspnea:  *** Cough: *** Sleeping: ***   resp cc  SABA use: *** 02: ***  Lung cancer screening: ***   No obvious day to day or daytime variability or assoc excess/ purulent sputum or mucus plugs or hemoptysis or cp or chest tightness,  subjective wheeze or overt sinus or hb symptoms.    Also denies any obvious fluctuation of symptoms with weather or environmental changes or other aggravating or alleviating factors except as outlined above   No unusual exposure hx or h/o childhood pna/ asthma or knowledge of premature birth.  Current Allergies, Complete Past Medical History, Past Surgical History, Family History, and Social History were reviewed in [employer] record.  ROS  The following are not active complaints unless bolded Hoarseness, sore throat, dysphagia, dental problems, itching, sneezing,  nasal congestion or discharge of excess mucus or purulent secretions, ear ache,   fever, chills, sweats, unintended wt loss or wt gain, classically pleuritic or exertional cp,  orthopnea pnd or arm/hand swelling  or leg swelling, presyncope, palpitations, abdominal pain, anorexia, nausea, vomiting, diarrhea  or change in bowel habits or change in bladder habits, change in stools or change in urine, dysuria, hematuria,  rash, arthralgias, visual complaints, headache, numbness, weakness or ataxia or problems with walking or coordination,  change in mood or  memory.        No outpatient medications have been marked as taking for the 06/11/23 encounter (Appointment) with Nyoka Cowden, MD.                Past Medical History:  Diagnosis Date   AAA (abdominal aortic aneurysm) without rupture (HCC) 08/25/2016   Anxiety    Arthritis    Bipolar 1 disorder (HCC)    CAD (coronary artery disease)    a. 08/08/16 LAD 70%, 60% 1st diag, CTO RCA Med Rx EF 55%   Carotid artery stenosis    Right carotid endarterectomy 07/22/05 with recurrent stenosis 01/2017; known left ICA occlusion   COPD (chronic obstructive pulmonary disease) (HCC)    Crack cocaine use    Depression    Headache    Hypertension    Myocardial infarction [facility])    Right Groin Hematoma    a. 07/2016 following diagnostic cath   Stroke [facility]) 2017       Objective:     Wt Readings from Last 3 Encounters:  05/31/23 215 lb (97.5 kg)  05/27/23 215 lb (97.5 kg)  05/25/23 215 lb (97.5 kg)    Vital signs reviewed  06/11/2023  - Note at rest 02 sats  ***% on ***   General appearance:    ***    Mild barr 2+ pitting both LE ***   I personally reviewed images and agree with radiology impression as follows:  CXR:   05/22/23  portable Mild cardiomegaly with mild vascular congestion. No focal  consolidation   BNP 05/22/23  =  751     Assessment

## 2023-06-11 ENCOUNTER — Ambulatory Visit: Payer: Medicaid Other | Admitting: Internal Medicine

## 2023-06-11 ENCOUNTER — Other Ambulatory Visit: Payer: Self-pay

## 2023-06-11 DIAGNOSIS — I7143 Infrarenal abdominal aortic aneurysm, without rupture: Secondary | ICD-10-CM

## 2023-06-11 DIAGNOSIS — I6523 Occlusion and stenosis of bilateral carotid arteries: Secondary | ICD-10-CM

## 2023-06-15 ENCOUNTER — Other Ambulatory Visit: Payer: Self-pay | Admitting: Family Medicine

## 2023-06-15 DIAGNOSIS — Z1231 Encounter for screening mammogram for malignant neoplasm of breast: Secondary | ICD-10-CM

## 2023-06-22 ENCOUNTER — Telehealth: Payer: Self-pay | Admitting: Family Medicine

## 2023-06-22 NOTE — Telephone Encounter (Signed)
Patient called in to check status of orders for small O2 tank that should be sent in to [facility]  Phone [phone]   Patient wants a call back in regard

## 2023-07-09 ENCOUNTER — Telehealth: Payer: Self-pay | Admitting: Cardiology

## 2023-07-09 NOTE — Telephone Encounter (Signed)
Pt c/o medication issue:  1. Name of Medication:   metoprolol tartrate (LOPRESSOR) 100 MG tablet   2. How are you currently taking this medication (dosage and times per day)? 200 mg in the morning and evening  3. Are you having a reaction (difficulty breathing--STAT)?   4. What is your medication issue?   Patient wants a call back to confirm if she still needs to be taking this medication.

## 2023-07-12 NOTE — Telephone Encounter (Signed)
Patient states Dr.wert has stopped her metoprolol after only seeing him once patient was seeing him for COPD patient states he did nothing for her for this. She feels Dr.McDowell should continue her Metoprolol due to her BP being good and patient has been taking this medication for 8 years. She states she is not stopping this medication per dr.Wert she feels the decision should be up to Dr.McDowell

## 2023-07-12 NOTE — Telephone Encounter (Signed)
Looking through patient's chart Dr.Wert stopped her Metoprolol

## 2023-07-13 MED ORDER — METOPROLOL TARTRATE 100 MG PO TABS
100.0000 mg | ORAL_TABLET | Freq: Two times a day (BID) | ORAL | Status: DC
Start: 2023-07-13 — End: 2023-07-30

## 2023-07-13 NOTE — Telephone Encounter (Signed)
Spoke with patient she does not want to start the Bisoprolol she wants to continue Lopressor 100 Mg BID. Med changed reflected to medication list.

## 2023-07-13 NOTE — Addendum Note (Signed)
Addended by: Sharen Hones on: 07/13/2023 08:00 AM   Modules accepted: Orders

## 2023-07-29 NOTE — Patient Instructions (Signed)

## 2023-07-29 NOTE — Progress Notes (Signed)
Established Patient Office Visit   Subjective  Patient ID: [name], female    DOB: 09/26/1961  Age: 62 y.o. MRN: [id number]  Chief Complaint  Patient presents with   Follow-up    3 MO f/u for HTN and Hyperlipidemia. Needs refill for inhaler and B/P Medication. Also asking for portable breathing machine.     She  has a past medical history of AAA (abdominal aortic aneurysm) without rupture (HCC) (08/25/2016), Anxiety, Arthritis, Bipolar 1 disorder (HCC), CAD (coronary artery disease), Carotid artery stenosis, COPD (chronic obstructive pulmonary disease) (HCC), Crack cocaine use, Depression, Headache, Hypertension, Myocardial infarction [facility]), Right Groin Hematoma, and Stroke (HCC) (2017).  HPI Patient presents to the clinic for chronic follow up. For the details of today's visit, please refer to assessment and plan.   Review of Systems  Constitutional:  Negative for chills and fever.  Eyes:  Negative for blurred vision.  Respiratory:  Negative for shortness of breath.   Cardiovascular:  Negative for chest pain.  Gastrointestinal:  Negative for abdominal pain.  Genitourinary:  Negative for dysuria.  Neurological:  Negative for dizziness and headaches.      Objective:     BP 137/83   Pulse (!) 55   Ht 5\' 3"  (1.6 m)   Wt 211 lb 1.9 oz (95.8 kg)   LMP 07/18/2011   SpO2 96%   BMI 37.40 kg/m  BP Readings from Last 3 Encounters:  07/30/23 137/83  05/31/23 (!) 157/73  05/27/23 132/82      Physical Exam Vitals reviewed.  Constitutional:      General: She is not in acute distress.    Appearance: Normal appearance. She is not ill-appearing, toxic-appearing or diaphoretic.  HENT:     Head: Normocephalic.  Eyes:     General:        Right eye: No discharge.        Left eye: No discharge.     Conjunctiva/sclera: Conjunctivae normal.  Cardiovascular:     Rate and Rhythm: Normal rate.     Pulses: Normal pulses.  Pulmonary:     Effort: Pulmonary effort is normal.  No respiratory distress.     Breath sounds: Rhonchi present.  Abdominal:     General: Bowel sounds are normal.     Palpations: Abdomen is soft.     Tenderness: There is no abdominal tenderness. There is no right CVA tenderness, left CVA tenderness or guarding.  Skin:    General: Skin is warm and dry.     Capillary Refill: Capillary refill takes less than 2 seconds.  Neurological:     Mental Status: She is alert.  Psychiatric:        Mood and Affect: Mood normal.        Behavior: Behavior normal.      No results found for any visits on 07/30/23.  The ASCVD Risk score (Arnett DK, et al., 2019) failed to calculate for the following reasons:   The patient has a prior MI or stroke diagnosis    Assessment & Plan:  Primary hypertension  Prediabetes Assessment & Plan: Last Hemoglobin A1c level 5.9 Labs ordered today- awaiting results will follow up Discussed  Sample Day of Eating for Prediabetes:  Breakfast: Oatmeal with a handful of berries and a sprinkle of chia seeds, paired with a boiled egg.  Lunch: Grilled chicken breast on a bed of spinach and kale, topped with avocado, a drizzle of olive oil, and a slice of whole grain bread.  Snack: A handful of almonds or a small apple with peanut butter.  Dinner: Baked salmon with roasted broccoli and quinoa.  Snack (if needed): A piece of cheese or a small bowl of Greek yogurt.  Find an activity that you will enjoy and start to be active at least 5 days a week for 30 minutes each day.      Need for hepatitis C screening test  TSH (thyroid-stimulating hormone deficiency)  Screening for HIV (human immunodeficiency virus)  Mixed hyperlipidemia Assessment & Plan: LDL 146  Repeat panel done today Patient reports taking Lipitor 80 mg once daily Advise lifestyle modifications follow diet low in saturated fat, reduce dietary salt intake, avoid fatty foods, maintain an exercise routine 3 to 5 days a week for a minimum total of 150  minutes.    Essential hypertension Assessment & Plan: Vitals:   07/30/23 1156  BP: 137/83  Lasix 20 mg once daily, Metoprolol 100 mg 2 time daily, Clonidine 0.2 mg 2 times daily, amlodipine 10 mg daily Labs ordered. Discussed with  patient to monitor their blood pressure regularly and maintain a heart-healthy diet rich in fruits, vegetables, whole grains, and low-fat dairy, while reducing sodium intake to less than 2,300 mg per day. Regular physical activity, such as 30 minutes of moderate exercise most days of the week, will help lower blood pressure and improve overall cardiovascular health. Avoiding smoking, limiting alcohol consumption, and managing stress. Take  prescribed medication, & take it as directed and avoid skipping doses. Seek emergency care if your blood pressure is (over 180/100) or you experience chest pain, shortness of breath, or sudden vision changes.Patient verbalizes understanding regarding plan of care and all questions answered.      Return in about 6 months (around 01/27/2024) for chronic follow-up.   Cruzita Lederer Newman Nip, FNP

## 2023-07-30 ENCOUNTER — Encounter: Payer: Self-pay | Admitting: Family Medicine

## 2023-07-30 ENCOUNTER — Ambulatory Visit: Payer: Medicaid Other | Admitting: Family Medicine

## 2023-07-30 VITALS — BP 137/83 | HR 65 | Ht 63.0 in | Wt 211.1 lb

## 2023-07-30 DIAGNOSIS — E038 Other specified hypothyroidism: Secondary | ICD-10-CM

## 2023-07-30 DIAGNOSIS — I10 Essential (primary) hypertension: Secondary | ICD-10-CM | POA: Diagnosis not present

## 2023-07-30 DIAGNOSIS — R7303 Prediabetes: Secondary | ICD-10-CM | POA: Insufficient documentation

## 2023-07-30 DIAGNOSIS — E785 Hyperlipidemia, unspecified: Secondary | ICD-10-CM | POA: Insufficient documentation

## 2023-07-30 DIAGNOSIS — Z1159 Encounter for screening for other viral diseases: Secondary | ICD-10-CM

## 2023-07-30 DIAGNOSIS — E782 Mixed hyperlipidemia: Secondary | ICD-10-CM

## 2023-07-30 DIAGNOSIS — Z114 Encounter for screening for human immunodeficiency virus [HIV]: Secondary | ICD-10-CM

## 2023-07-30 MED ORDER — METOPROLOL TARTRATE 100 MG PO TABS: 100.0000 mg | ORAL_TABLET | Freq: Two times a day (BID) | ORAL | 3 refills | Status: DC

## 2023-07-30 MED ORDER — BREZTRI AEROSPHERE 160-9-4.8 MCG/ACT IN AERO: 2.0000 | INHALATION_SPRAY | Freq: Two times a day (BID) | RESPIRATORY_TRACT | 11 refills | Status: AC

## 2023-07-30 MED ORDER — CYCLOBENZAPRINE HCL 10 MG PO TABS: 10.0000 mg | ORAL_TABLET | Freq: Two times a day (BID) | ORAL | 1 refills | Status: DC | PRN

## 2023-07-30 MED ORDER — ALBUTEROL SULFATE HFA 108 (90 BASE) MCG/ACT IN AERS: 2.0000 | INHALATION_SPRAY | Freq: Four times a day (QID) | RESPIRATORY_TRACT | 6 refills | Status: DC | PRN

## 2023-07-30 MED ORDER — ATORVASTATIN CALCIUM 80 MG PO TABS: 80.0000 mg | ORAL_TABLET | Freq: Every day | ORAL | 1 refills | Status: DC

## 2023-07-30 MED ORDER — PANTOPRAZOLE SODIUM 40 MG PO TBEC: 40.0000 mg | DELAYED_RELEASE_TABLET | Freq: Two times a day (BID) | ORAL | 3 refills | Status: DC

## 2023-07-30 MED ORDER — AMLODIPINE BESYLATE 10 MG PO TABS: 10.0000 mg | ORAL_TABLET | Freq: Every day | ORAL | 1 refills | Status: DC

## 2023-07-30 MED ORDER — FUROSEMIDE 20 MG PO TABS: 20.0000 mg | ORAL_TABLET | Freq: Every day | ORAL | 2 refills | Status: DC

## 2023-07-30 MED ORDER — IPRATROPIUM-ALBUTEROL 0.5-2.5 (3) MG/3ML IN SOLN: 3.0000 mL | Freq: Four times a day (QID) | RESPIRATORY_TRACT | 3 refills | Status: DC | PRN

## 2023-07-30 MED ORDER — CLONIDINE HCL 0.2 MG PO TABS: 0.2000 mg | ORAL_TABLET | Freq: Two times a day (BID) | ORAL | 1 refills | Status: DC

## 2023-07-30 NOTE — Assessment & Plan Note (Addendum)
Vitals:   07/30/23 1156  BP: 137/83  Lasix 20 mg once daily, Metoprolol 100 mg 2 time daily, Clonidine 0.2 mg 2 times daily, amlodipine 10 mg daily Labs ordered. Discussed with  patient to monitor their blood pressure regularly and maintain a heart-healthy diet rich in fruits, vegetables, whole grains, and low-fat dairy, while reducing sodium intake to less than 2,300 mg per day. Regular physical activity, such as 30 minutes of moderate exercise most days of the week, will help lower blood pressure and improve overall cardiovascular health. Avoiding smoking, limiting alcohol consumption, and managing stress. Take  prescribed medication, & take it as directed and avoid skipping doses. Seek emergency care if your blood pressure is (over 180/100) or you experience chest pain, shortness of breath, or sudden vision changes.Patient verbalizes understanding regarding plan of care and all questions answered.

## 2023-07-30 NOTE — Assessment & Plan Note (Signed)
Last Hemoglobin A1c level 5.9 Labs ordered today- awaiting results will follow up Discussed  Sample Day of Eating for Prediabetes:  Breakfast: Oatmeal with a handful of berries and a sprinkle of chia seeds, paired with a boiled egg.  Lunch: Grilled chicken breast on a bed of spinach and kale, topped with avocado, a drizzle of olive oil, and a slice of whole grain bread.  Snack: A handful of almonds or a small apple with peanut butter.  Dinner: Baked salmon with roasted broccoli and quinoa.  Snack (if needed): A piece of cheese or a small bowl of Greek yogurt.  Find an activity that you will enjoy and start to be active at least 5 days a week for 30 minutes each day.

## 2023-07-30 NOTE — Assessment & Plan Note (Signed)
LDL 146  Repeat panel done today Patient reports taking Lipitor 80 mg once daily Advise lifestyle modifications follow diet low in saturated fat, reduce dietary salt intake, avoid fatty foods, maintain an exercise routine 3 to 5 days a week for a minimum total of 150 minutes.

## 2023-08-03 ENCOUNTER — Encounter: Payer: Self-pay | Admitting: Cardiology

## 2023-08-03 ENCOUNTER — Encounter: Payer: Self-pay | Admitting: *Deleted

## 2023-08-03 NOTE — Telephone Encounter (Signed)
Pt called in to check status on portable O2 tank orders . Original request below

## 2023-08-04 ENCOUNTER — Ambulatory Visit: Payer: Medicaid Other | Admitting: Cardiology

## 2023-08-04 NOTE — Telephone Encounter (Signed)
Hello Brandi, please help [name] how to place this order.  Thank you

## 2023-08-04 NOTE — Telephone Encounter (Signed)
Copied from CRM [id number]. Topic: General - Other >> Aug 04, 2023 11:41 AM Dennison Nancy wrote: Reason for CRM: patient requesting if she can get a portable oxygen tank , she has an oxygen tank but would like a portable , call back   [phone]

## 2023-08-06 ENCOUNTER — Telehealth: Payer: Self-pay | Admitting: Family Medicine

## 2023-08-06 NOTE — Telephone Encounter (Signed)
There was a form scanned in from Oct that requests a portable tank and needed a provider signature and I faxed back and told them to let us know if anything else is needed from [place] before dispensing a portable tank

## 2023-08-06 NOTE — Telephone Encounter (Signed)
Patient is requesting a walker  she needs a prescription so her insurance can cover it

## 2023-08-09 ENCOUNTER — Other Ambulatory Visit: Payer: Self-pay | Admitting: Family Medicine

## 2023-08-09 NOTE — Telephone Encounter (Signed)
I approve

## 2023-08-18 ENCOUNTER — Other Ambulatory Visit: Payer: Self-pay

## 2023-08-31 ENCOUNTER — Ambulatory Visit (INDEPENDENT_AMBULATORY_CARE_PROVIDER_SITE_OTHER): Payer: Medicaid Other | Admitting: Orthopedic Surgery

## 2023-08-31 DIAGNOSIS — M1711 Unilateral primary osteoarthritis, right knee: Secondary | ICD-10-CM

## 2023-08-31 DIAGNOSIS — M170 Bilateral primary osteoarthritis of knee: Secondary | ICD-10-CM

## 2023-08-31 DIAGNOSIS — M1712 Unilateral primary osteoarthritis, left knee: Secondary | ICD-10-CM

## 2023-08-31 NOTE — Progress Notes (Signed)
Return patient Visit  Assessment: [name] is a 62 y.o. female with the following: 1. Arthritis of left knee 2.  Arthritis of right knee  Plan: [name] has advanced bilateral knee arthritis.  Injections do provide some improvement in symptoms.  Pain does restrict her, but she also has pain in the lower back.  She would like to proceed with bilateral steroid injections.  This was completed in clinic today.  She will return to clinic as needed.  I have urged her to be more ambulatory, in order to preserve her strength, which can help with her ongoing pain.  Procedure note injection Left knee joint   Verbal consent was obtained to inject the left knee joint  Timeout was completed to confirm the site of injection.  The skin was prepped with alcohol and ethyl chloride was sprayed at the injection site.  A 21-gauge needle was used to inject 40 mg of Depo-Medrol and 1% lidocaine (3 cc) into the left knee using an anterolateral approach.  There were no complications. A sterile bandage was applied.  Procedure note injection Right knee joint   Verbal consent was obtained to inject the right knee joint  Timeout was completed to confirm the site of injection.  The skin was prepped with alcohol and ethyl chloride was sprayed at the injection site.  A 21-gauge needle was used to inject 40 mg of Depo-Medrol and 1% lidocaine (4 cc) into the right knee using an anterolateral approach.  There were no complications. A sterile bandage was applied.      Follow-up: Return if symptoms worsen or fail to improve.  Subjective:  Chief Complaint  Patient presents with   Injections    History of Present Illness: [name] is a 62 y.o. female who returns for evaluation of bilateral knee pain.  I have seen her in clinic several times.  Most recently, injected her left knee.  Injection lasted for at least 4 weeks.  Over the past couple weeks, she notes that the pain is worsened.  She is  now having pain in the right knee as well.  She does not ambulate on a regular basis.  She states that she has pain in her knees, but also pain in her lower back, which limits her ability to ambulate.  Review of Systems: No fevers or chills No numbness or tingling No chest pain No shortness of breath No bowel or bladder dysfunction No GI distress No headaches     Objective: LMP 07/18/2011   Physical Exam:  General: Elderly female., Alert and oriented., and No acute distress. Gait: She is in a wheelchair  Left knee with mild swelling.  Flexion contracture of 15-20 degrees.  She tolerates flexion beyond 90 degrees.  Tenderness palpation both the medial and lateral joint lines.  Right knee with minimal swelling.  Just a few degrees short of full extension.  She tolerates flexion beyond 90 degrees.  Tenderness to palpation along both the medial and lateral joint lines.  IMAGING: No new imaging obtained today    New Medications:  No orders of the defined types were placed in this encounter.     Oliver Barre, MD  08/31/2023 1:17 PM

## 2023-08-31 NOTE — Patient Instructions (Signed)

## 2023-09-02 NOTE — Telephone Encounter (Signed)
PA sent in . Rolling walker ordered.

## 2023-09-08 ENCOUNTER — Telehealth: Payer: Self-pay

## 2023-09-17 NOTE — Telephone Encounter (Signed)
Encounter made in error. 

## 2023-10-18 ENCOUNTER — Ambulatory Visit: Payer: Medicaid Other | Admitting: Cardiology

## 2023-11-09 ENCOUNTER — Other Ambulatory Visit: Payer: Self-pay | Admitting: Family Medicine

## 2023-11-17 ENCOUNTER — Ambulatory Visit: Payer: Medicaid Other | Admitting: Cardiology

## 2023-12-01 ENCOUNTER — Ambulatory Visit: Payer: Medicaid Other | Admitting: Family Medicine

## 2023-12-10 ENCOUNTER — Other Ambulatory Visit: Payer: Self-pay | Admitting: Family Medicine

## 2024-01-05 NOTE — Patient Instructions (Signed)

## 2024-01-05 NOTE — Progress Notes (Unsigned)
   Established Patient Office Visit   Subjective  Patient ID: [name], female    DOB: 01-03-1961  Age: 63 y.o. MRN: [id number]  No chief complaint on file.   She  has a past medical history of AAA (abdominal aortic aneurysm) without rupture (HCC) (08/25/2016), Anxiety, Arthritis, Bipolar 1 disorder (HCC), CAD (coronary artery disease), Carotid artery stenosis, COPD (chronic obstructive pulmonary disease) (HCC), Crack cocaine use, Depression, Headache, Hypertension, Myocardial infarction [facility]), Right Groin Hematoma, and Stroke (HCC) (2017).  HPI  ROS    Objective:     LMP 07/18/2011  {Vitals History (Optional):23777}  Physical Exam   No results found for any visits on 01/06/24.  The ASCVD Risk score (Arnett DK, et al., 2019) failed to calculate for the following reasons:   Risk score cannot be calculated because patient has a medical history suggesting prior/existing ASCVD    Assessment & Plan:  There are no diagnoses linked to this encounter.  No follow-ups on file.   Cruzita Lederer Newman Nip, FNP

## 2024-01-06 ENCOUNTER — Encounter: Payer: Self-pay | Admitting: Family Medicine

## 2024-01-06 ENCOUNTER — Ambulatory Visit (INDEPENDENT_AMBULATORY_CARE_PROVIDER_SITE_OTHER): Admitting: Family Medicine

## 2024-01-06 VITALS — BP 116/82 | HR 88 | Ht 63.0 in | Wt 214.0 lb

## 2024-01-06 DIAGNOSIS — I10 Essential (primary) hypertension: Secondary | ICD-10-CM | POA: Diagnosis not present

## 2024-01-06 DIAGNOSIS — Z1231 Encounter for screening mammogram for malignant neoplasm of breast: Secondary | ICD-10-CM

## 2024-01-06 DIAGNOSIS — R0789 Other chest pain: Secondary | ICD-10-CM | POA: Diagnosis not present

## 2024-01-06 LAB — EKG 12-LEAD

## 2024-01-06 MED ORDER — PANTOPRAZOLE SODIUM 40 MG PO TBEC: 40.0000 mg | DELAYED_RELEASE_TABLET | Freq: Two times a day (BID) | ORAL | 3 refills | Status: AC

## 2024-01-06 MED ORDER — IPRATROPIUM-ALBUTEROL 0.5-2.5 (3) MG/3ML IN SOLN: 3.0000 mL | Freq: Four times a day (QID) | RESPIRATORY_TRACT | 3 refills | Status: AC | PRN

## 2024-01-06 MED ORDER — CLONIDINE HCL 0.2 MG PO TABS: 0.2000 mg | ORAL_TABLET | Freq: Two times a day (BID) | ORAL | 1 refills | Status: AC

## 2024-01-06 MED ORDER — AMLODIPINE BESYLATE 10 MG PO TABS: 10.0000 mg | ORAL_TABLET | Freq: Every day | ORAL | 1 refills | Status: DC

## 2024-01-06 MED ORDER — FUROSEMIDE 20 MG PO TABS: 20.0000 mg | ORAL_TABLET | Freq: Every day | ORAL | 2 refills | Status: DC

## 2024-01-06 MED ORDER — NITROGLYCERIN 0.4 MG SL SUBL: SUBLINGUAL_TABLET | SUBLINGUAL | 6 refills | Status: AC

## 2024-01-06 MED ORDER — CYCLOBENZAPRINE HCL 10 MG PO TABS: 10.0000 mg | ORAL_TABLET | Freq: Two times a day (BID) | ORAL | 2 refills | Status: AC | PRN

## 2024-01-06 MED ORDER — METOPROLOL TARTRATE 100 MG PO TABS: 200.0000 mg | ORAL_TABLET | Freq: Two times a day (BID) | ORAL | 1 refills | Status: DC

## 2024-01-06 NOTE — Assessment & Plan Note (Signed)
 Vitals:   01/06/24 1041  BP: 116/82  Lasix 20 mg once daily, Metoprolol 100 mg 2 times daily, Clonidine 0.2 mg 2 times daily, amlodipine 10 mg daily Labs ordered. Discussed with  patient to monitor their blood pressure regularly and maintain a heart-healthy diet rich in fruits, vegetables, whole grains, and low-fat dairy, while reducing sodium intake to less than 2,300 mg per day. Regular physical activity, such as 30 minutes of moderate exercise most days of the week, will help lower blood pressure and improve overall cardiovascular health. Avoiding smoking, limiting alcohol consumption, and managing stress. Take  prescribed medication, & take it as directed and avoid skipping doses. Seek emergency care if your blood pressure is (over 180/100) or you experience chest pain, shortness of breath, or sudden vision changes.Patient verbalizes understanding regarding plan of care and all questions answered.

## 2024-01-06 NOTE — Assessment & Plan Note (Addendum)
 Hx of Angina EKG Ordered awaiting results will follow up Refilled Nitroglycerin 0.4 mg SL use when symptoms of chest pain Advise Asprin 81 mg once daily Advise Follow a heart-healthy diet (e.g., DASH or Mediterranean diet). Reduce intake of saturated fats, sodium, and added sugars. Increase fruits, vegetables, whole grains, and lean proteins. Avoid processed foods and trans fats. Pt is being followed by cardiology Advise Go to the ER if chest pain is: New, severe, or worsening Associated with shortness of breath, dizziness, nausea, sweating, or radiation to jaw/arm -patient agreed to the plan of care

## 2024-01-07 ENCOUNTER — Other Ambulatory Visit: Payer: Self-pay | Admitting: Family Medicine

## 2024-01-07 DIAGNOSIS — R768 Other specified abnormal immunological findings in serum: Secondary | ICD-10-CM

## 2024-01-07 LAB — BMP8+EGFR
BUN/Creatinine Ratio: 12 (ref 12–28)
BUN: 12 mg/dL (ref 8–27)
CO2: 22 mmol/L (ref 20–29)
Calcium: 9.7 mg/dL (ref 8.7–10.3)
Chloride: 101 mmol/L (ref 96–106)
Creatinine, Ser: 1.04 mg/dL — ABNORMAL HIGH (ref 0.57–1.00)
Glucose: 123 mg/dL — ABNORMAL HIGH (ref 70–99)
Potassium: 4.4 mmol/L (ref 3.5–5.2)
Sodium: 143 mmol/L (ref 134–144)
eGFR: 60 mL/min/{1.73_m2} (ref >59)

## 2024-01-07 LAB — LIPID PANEL
Chol/HDL Ratio: 4.9 ratio — ABNORMAL HIGH (ref 0.0–4.4)
Cholesterol, Total: 176 mg/dL (ref 100–199)
HDL: 36 mg/dL — ABNORMAL LOW (ref >39)
LDL Chol Calc (NIH): 108 mg/dL — ABNORMAL HIGH (ref 0–99)
Triglycerides: 185 mg/dL — ABNORMAL HIGH (ref 0–149)
VLDL Cholesterol Cal: 32 mg/dL (ref 5–40)

## 2024-01-07 LAB — HEMOGLOBIN A1C
Est. average glucose Bld gHb Est-mCnc: 128 mg/dL
Hgb A1c MFr Bld: 6.1 % — ABNORMAL HIGH (ref 4.8–5.6)

## 2024-01-07 LAB — TSH+FREE T4
Free T4: 1.54 ng/dL (ref 0.82–1.77)
TSH: 1.43 u[IU]/mL (ref 0.450–4.500)

## 2024-01-07 LAB — HEPATITIS C ANTIBODY: Hep C Virus Ab: REACTIVE — AB

## 2024-01-07 LAB — HIV ANTIBODY (ROUTINE TESTING W REFLEX): HIV Screen 4th Generation wRfx: NONREACTIVE

## 2024-01-07 NOTE — Progress Notes (Signed)
 Please inform patient,   Cholesterol levels elevated, start lifestyle modifications and follow diet low in saturated fat. Continue Lipitor 80 mg once daily  Hep C Reactive, HCV antibody results should be followed up with an HCV RNA test to support the diagnosis of active HCV infection.   Please follow up for repeat blood work ONLY. You can come into the clinic Monday-Friday 8-11 am or 1-4 pm  Hemoglobin A1c  6.1 indicates prediabetes - no medication intervention just lifestyle changes   Pre-Diabetes Diet  Eat More:  Whole grains: Oats, quinoa, brown rice. Vegetables: Leafy greens, broccoli, green beans. Fruits: Low-sugar like berries, apples. Lean proteins: Chicken, fish, beans, eggs. Healthy fats: Nuts, seeds, avocado, olive oil.  Limit:  Refined carbs: White bread, pastries. Sugary foods: Soda, candy, desserts. Fried and fatty foods.  Tips:  Eat balanced meals with portion control. Stay hydrated (water over sugary drinks). Pair with regular exercise (e.g., walking). Example: Grilled chicken, quinoa, and steamed broccoli.  Combine diet with regular physical activity (e.g., 30 minutes of walking daily or 5 times per week.)

## 2024-01-26 ENCOUNTER — Ambulatory Visit (HOSPITAL_COMMUNITY)

## 2024-02-22 ENCOUNTER — Other Ambulatory Visit: Payer: Self-pay | Admitting: Physician Assistant

## 2024-02-22 DIAGNOSIS — I7143 Infrarenal abdominal aortic aneurysm, without rupture: Secondary | ICD-10-CM

## 2024-02-22 DIAGNOSIS — I739 Peripheral vascular disease, unspecified: Secondary | ICD-10-CM

## 2024-02-22 DIAGNOSIS — I6523 Occlusion and stenosis of bilateral carotid arteries: Secondary | ICD-10-CM

## 2024-03-03 ENCOUNTER — Ambulatory Visit

## 2024-03-03 ENCOUNTER — Encounter (HOSPITAL_COMMUNITY)

## 2024-03-03 ENCOUNTER — Other Ambulatory Visit (HOSPITAL_COMMUNITY)

## 2024-03-10 ENCOUNTER — Ambulatory Visit (INDEPENDENT_AMBULATORY_CARE_PROVIDER_SITE_OTHER): Admitting: Physician Assistant

## 2024-03-10 ENCOUNTER — Ambulatory Visit (HOSPITAL_COMMUNITY)
Admission: RE | Admit: 2024-03-10 | Discharge: 2024-03-10 | Disposition: A | Discharge status: Home / Self Care | Source: Ambulatory Visit | Attending: Vascular Surgery

## 2024-03-10 ENCOUNTER — Encounter: Payer: Self-pay | Admitting: Physician Assistant

## 2024-03-10 ENCOUNTER — Ambulatory Visit (HOSPITAL_COMMUNITY)
Admission: RE | Admit: 2024-03-10 | Discharge: 2024-03-10 | Disposition: A | Discharge status: Home / Self Care | Source: Ambulatory Visit | Attending: Vascular Surgery | Admitting: Vascular Surgery

## 2024-03-10 VITALS — BP 169/95 | HR 85 | Temp 98.2°F | Ht 63.0 in | Wt 219.8 lb

## 2024-03-10 DIAGNOSIS — I6523 Occlusion and stenosis of bilateral carotid arteries: Secondary | ICD-10-CM

## 2024-03-10 DIAGNOSIS — Z9889 Other specified postprocedural states: Secondary | ICD-10-CM

## 2024-03-10 DIAGNOSIS — I6522 Occlusion and stenosis of left carotid artery: Secondary | ICD-10-CM | POA: Diagnosis not present

## 2024-03-10 DIAGNOSIS — I7143 Infrarenal abdominal aortic aneurysm, without rupture: Secondary | ICD-10-CM | POA: Diagnosis not present

## 2024-03-10 DIAGNOSIS — I739 Peripheral vascular disease, unspecified: Secondary | ICD-10-CM

## 2024-03-10 NOTE — Progress Notes (Signed)
 Office Note     CC:  follow up Requesting Provider:  Del Orbe Polanco, Ilian*  HPI: [name] is a 63 y.o. ([birth year]) female who presents for surveillance follow up of PAD, carotid artery stenosis and AAA. She has remote history of right CEA in October of 2006. She has known left ICA occlusion. We have been following her for a >4 cm AAA. At her last visit with Dr. Shaunna Delaware in August of 2024 its maximal diameter was 4.7 cm.   Patient has infrainguinal arterial occlusive disease bilaterally. Her ABIs have been stable- 89% on the right and 73% on the left. Today she denies any new abdominal pain or back pain. She has chronic back pain from her spine being  bone on bone. She says this has not changed. She denies any history of stroke, TIAs, expressive or receptive aphasia, or amaurosis fugax. She does not describe any claudication but her ambulation is limited. She has arthritis in her left knee which limits her as well as her COPD. She is in a wheel chair quite a bit. She has bilateral lower extremity swelling but has not been a lot recently. She denies any history of rest pain or nonhealing ulcers.   Past Medical History:  Diagnosis Date   AAA (abdominal aortic aneurysm) without rupture (HCC) 08/25/2016   Anxiety    Arthritis    Bipolar 1 disorder (HCC)    CAD (coronary artery disease)    a. 08/08/16 LAD 70%, 60% 1st diag, CTO RCA Med Rx EF 55%   Carotid artery stenosis    Right carotid endarterectomy 07/22/05 with recurrent stenosis 01/2017; known left ICA occlusion   COPD (chronic obstructive pulmonary disease) (HCC)    Crack cocaine use    Depression    Headache    Hypertension    Myocardial infarction [facility])    Right Groin Hematoma    a. 07/2016 following diagnostic cath   Stroke [facility]) 2017    Past Surgical History:  Procedure Laterality Date   CARDIAC CATHETERIZATION N/A 08/07/2016   Procedure: Left Heart Cath and Coronary Angiography;  Surgeon: Millicent Ally, MD;   Location: [facility] INVASIVE CV LAB;  Service: Cardiovascular;  Laterality: N/A;   CYST REMOVAL TRUNK Left    beneath L breast   ENDARTERECTOMY Right 02/08/2018   Procedure: ENDARTERECTOMY CAROTID REDO;  Surgeon: Dannis Dy, MD;  Location: [facility] OR;  Service: Vascular;  Laterality: Right;   TUBAL LIGATION     VASCULAR SURGERY Right    right carotid endarterectomy 07/22/05     Social History   Socioeconomic History   Marital status: Single    Spouse name: Not on file   Number of children: Not on file   Years of education: Not on file   Highest education level: Not on file  Occupational History   Not on file  Tobacco Use   Smoking status: Every Day    Current packs/day: 0.50    Average packs/day: 0.5 packs/day for 49.4 years (24.7 ttl pk-yrs)    Types: Cigarettes    Start date: 10/05/1974   Smokeless tobacco: Never  Vaping Use   Vaping status: Never Used  Substance and Sexual Activity   Alcohol use: No   Drug use: Not Currently    Frequency: 2.0 times per week    Comment: marijauna . reports no more crack since Feb 08, 2018   Sexual activity: Not Currently  Other Topics Concern   Not on file  Social History  Narrative   Not on file   Social Drivers of Health   Financial Resource Strain: Low Risk  (11/30/2023)   Received from [facility]   Overall Financial Resource Strain (CARDIA)    Difficulty of Paying Living Expenses: Not hard at all  Food Insecurity: No Food Insecurity (11/30/2023)   Received from [facility]   Hunger Vital Sign    Within the past 12 months, you worried that your food would run out before you got the money to buy more.: Never true    Within the past 12 months, the food you bought just didn't last and you didn't have money to get more.: Never true  Transportation Needs: No Transportation Needs (11/30/2023)   Received from [facility] - Transportation    Lack of Transportation (Medical): No    Lack of Transportation (Non-Medical): No   Physical Activity: Not on file  Stress: Not on file  Social Connections: Not on file  Intimate Partner Violence: Not on file    Family History  Problem Relation Age of Onset   Heart attack Mother    Stroke Mother    Hypertension Mother    Diabetes Mother    Heart disease Mother    Heart attack Father    Hypertension Father    Heart disease Father    Cancer Maternal Aunt    Cancer Maternal Uncle    COPD Paternal Uncle        2 pat uncles with COPD   Cancer Paternal Uncle        1 pat uncle with cancer   Colon cancer Neg Hx     Current Outpatient Medications  Medication Sig Dispense Refill   albuterol  (VENTOLIN  HFA) 108 (90 Base) MCG/ACT inhaler Inhale 2 puffs into the lungs every 6 (six) hours as needed for wheezing or shortness of breath. 18 g 6   amLODipine  (NORVASC ) 10 MG tablet Take 1 tablet (10 mg total) by mouth daily. 90 tablet 1   aspirin  EC 81 MG tablet Take 81 mg by mouth daily. Swallow whole.     atorvastatin  (LIPITOR ) 80 MG tablet Take 1 tablet (80 mg total) by mouth daily. 90 tablet 1   Budeson-Glycopyrrol-Formoterol (BREZTRI  AEROSPHERE) 160-9-4.8 MCG/ACT AERO Inhale 2 puffs into the lungs 2 (two) times daily. 10.7 g 11   clonazePAM  (KLONOPIN ) 0.5 MG tablet Take 1 tablet (0.5 mg total) by mouth 2 (two) times daily as needed for anxiety. 14 tablet 0   cloNIDine  (CATAPRES ) 0.2 MG tablet Take 1 tablet (0.2 mg total) by mouth 2 (two) times daily. 180 tablet 1   cyclobenzaprine  (FLEXERIL ) 10 MG tablet Take 1 tablet (10 mg total) by mouth 2 (two) times daily as needed. 60 tablet 2   furosemide  (LASIX ) 20 MG tablet Take 1 tablet (20 mg total) by mouth daily. 30 tablet 2   gabapentin  (NEURONTIN ) 300 MG capsule TAKE 1 CAPSULE (300 MG TOTAL) BY MOUTH 3 (THREE) TIMES DAILY AS NEEDED (LEG PAIN). 90 capsule 2   ipratropium-albuterol  (DUONEB) 0.5-2.5 (3) MG/3ML SOLN Inhale 3 mLs into the lungs every 6 (six) hours as needed. 360 mL 3   metoprolol  tartrate (LOPRESSOR ) 100 MG  tablet Take 2 tablets (200 mg total) by mouth 2 (two) times daily. 360 tablet 1   naloxone (NARCAN) nasal spray 4 mg/0.1 mL PLEASE SEE ATTACHED FOR DETAILED DIRECTIONS     nitroGLYCERIN  (NITROSTAT ) 0.4 MG SL tablet PLACE 1 TABLET UNDER TONGUE AS NEEDED FOR CHEST PAIN EVERY  5 MINUTES X 3 MAX DOSES.  CALL 911 IF PAIN PERSISTS 25 tablet 6   oxyCODONE -acetaminophen  (PERCOCET/ROXICET) 5-325 MG tablet Take 1 tablet by mouth every 6 (six) hours as needed.     oxyCODONE -acetaminophen  (PERCOCET/ROXICET) 5-325 MG tablet Take 1 tablet by mouth every 6 (six) hours as needed for severe pain. 6 tablet 0   pantoprazole  (PROTONIX ) 40 MG tablet Take 1 tablet (40 mg total) by mouth 2 (two) times daily before a meal. 60 tablet 3   XTAMPZA  ER 9 MG C12A Take 1 capsule by mouth 2 (two) times daily.     sucralfate  (CARAFATE ) 1 GM/10ML suspension Take 10 mLs (1 g total) by mouth 4 (four) times daily for 10 days. 414 mL 0   No current facility-administered medications for this visit.    Allergies  Allergen Reactions   Penicillins Hives and Swelling    PATIENT HAS HAD A PCN REACTION WITH IMMEDIATE RASH, FACIAL/TONGUE/THROAT SWELLING, SOB, OR LIGHTHEADEDNESS WITH HYPOTENSION:  #  #  YES  #  # HAS PT DEVELOPED SEVERE RASH INVOLVING MUCUS MEMBRANES or SKIN NECROSIS: #  #  YES  #  # Has patient had a PCN reaction that required hospitalization No Has patient had a PCN reaction occurring within the last 10 years: Non If all of the above answers are NO, then may proceed with Cephalosporin use.      REVIEW OF SYSTEMS:  [X]  denotes positive finding, [ ]  denotes negative finding Cardiac  Comments:  Chest pain or chest pressure:    Shortness of breath upon exertion:    Short of breath when lying flat:    Irregular heart rhythm:        Vascular    Pain in calf, thigh, or hip brought on by ambulation:    Pain in feet at night that wakes you up from your sleep:     Blood clot in your veins:    Leg swelling:          Pulmonary    Oxygen at home:    Productive cough:     Wheezing:         Neurologic    Sudden weakness in arms or legs:     Sudden numbness in arms or legs:     Sudden onset of difficulty speaking or slurred speech:    Temporary loss of vision in one eye:     Problems with dizziness:         Gastrointestinal    Blood in stool:     Vomited blood:         Genitourinary    Burning when urinating:     Blood in urine:        Psychiatric    Major depression:         Hematologic    Bleeding problems:    Problems with blood clotting too easily:        Skin    Rashes or ulcers:        Constitutional    Fever or chills:      PHYSICAL EXAMINATION:  Vitals:   03/10/24 1033 03/10/24 1034  BP: (!) 159/114 (!) 169/95  Pulse: 85   Temp: 98.2 F (36.8 C)   TempSrc: Temporal   SpO2: 93%   Weight: 219 lb 12.8 oz (99.7 kg)   Height: 5' 3 (1.6 m)     General:  WDWN in NAD; vital signs documented above Gait: Not observed, in wheel chair HENT:  WNL, normocephalic Pulmonary: normal non-labored breathing Cardiac: regular HR Abdomen: soft, NT, no masses. Normal BS Vascular Exam/Pulses: 2+ femoral, no palpable distal pulses, feet warm and well perfused Extremities: without ischemic changes, without Gangrene , without cellulitis; without open wounds;  Musculoskeletal: no muscle wasting or atrophy  Neurologic: A&O X 3 Psychiatric:  The pt has Normal affect.   Non-Invasive Vascular Imaging:   VAS US  AAA Duplex: Abdominal Aorta Findings:  +-----------+-------+----------+----------+--------+--------+--------+  Location  AP (cm)Trans (cm)PSV (cm/s)WaveformThrombusComments  +-----------+-------+----------+----------+--------+--------+--------+  Proximal  2.68   2.58      62                                  +-----------+-------+----------+----------+--------+--------+--------+  Mid       3.89   3.69      33                                   +-----------+-------+----------+----------+--------+--------+--------+  Distal    4.63   5.00      30                                  +-----------+-------+----------+----------+--------+--------+--------+  RT CIA Prox1.0    1.1       321                                 +-----------+-------+----------+----------+--------+--------+--------+  LT CIA Prox0.7    0.8       287                                 +-----------+-------+----------+----------+--------+--------+--------+   Summary:  Abdominal Aorta: There is evidence of abnormal dilatation of the mid and distal Abdominal aorta. Previous diameter measurement was obtained on 05/27/23: mid aorta 4.27 x 3.89 distal aorta 4.68 x 4.46. Stenosis: Bilateral proximal CIA stenosis >50%.   VAS US  Carotid duplex: Summary:  Right Carotid: Velocities in the right ICA are consistent with a 1-39% stenosis.   Left Carotid: Evidence consistent with a total occlusion of the left ICA. The CCA appears occluded.   Vertebrals:  Bilateral vertebral arteries demonstrate antegrade flow.  Subclavians: Bilateral subclavian artery waveforms are monophasic.    ASSESSMENT/PLAN:: 63 y.o. female here for follow up for surveillance follow up of PAD, Carotid artery stenosis and AAA. Discussed importance of smoking cessation and its impact on her multiple vascular issues.   Abdominal Aortic Aneurysm: - Duplex shows slight increase from last year with largest diameter of 5 cm compared to 4.7 cm 9 months ago - avoid any heavy lifting or straining - Consideration for repair of AAA would be made when the size is 5.5 cm, growth > 1 cm/yr, and symptomatic status - she understands that should she have any shearing or severe back or abdominal pain to present to ER immediately - will repeat AAA duplex in 6 months  Carotid artery stenosis: - Duplex shows 1-39% stenosis on Right ICA. Left known occlusion. Bilateral vertebral and subclavian arteries  patent - Continue Aspirin  and Statin -  reviewed signs and symptoms of stroke and she understands to seek immediate medical attention should this occur - will repeat carotid duplex in 1  year  PAD: - she is without any claudication, rest pain or tissue loss - Continue Aspirin  and Statin - Encourage walking regimen - Will repeat ABI in 6 months   Deneen Finical, PA-C [facility] [phone]  Clinic MD:   Susi Eric

## 2024-03-13 ENCOUNTER — Other Ambulatory Visit: Payer: Self-pay

## 2024-03-13 DIAGNOSIS — I7143 Infrarenal abdominal aortic aneurysm, without rupture: Secondary | ICD-10-CM

## 2024-03-13 DIAGNOSIS — I739 Peripheral vascular disease, unspecified: Secondary | ICD-10-CM

## 2024-03-23 ENCOUNTER — Telehealth: Payer: Self-pay | Admitting: Family Medicine

## 2024-03-23 DIAGNOSIS — I63531 Cerebral infarction due to unspecified occlusion or stenosis of right posterior cerebral artery: Secondary | ICD-10-CM | POA: Insufficient documentation

## 2024-03-23 MED ORDER — METOPROLOL TARTRATE 100 MG PO TABS: 200.0000 mg | ORAL_TABLET | Freq: Two times a day (BID) | ORAL | 1 refills | Status: DC

## 2024-03-23 MED ORDER — ALBUTEROL SULFATE HFA 108 (90 BASE) MCG/ACT IN AERS: 2.0000 | INHALATION_SPRAY | Freq: Four times a day (QID) | RESPIRATORY_TRACT | 6 refills | Status: AC | PRN

## 2024-03-23 NOTE — Telephone Encounter (Signed)
 Copied from CRM [id number]. Topic: Clinical - Medication Refill >> Mar 23, 2024 12:06 PM Carlatta H wrote: Medication: albuterol  (VENTOLIN  HFA) 108 (90 Base) MCG/ACT inhaler metoprolol  tartrate (LOPRESSOR ) 100 MG tablet  Has the patient contacted their pharmacy? No (Agent: If no, request that the patient contact the pharmacy for the refill. If patient does not wish to contact the pharmacy document the reason why and proceed with request.) (Agent: If yes, when and what did the pharmacy advise?) Patient left prescription at daughters house for Metoprolol   This is the patient's preferred pharmacy:    [id number] - [place], [place] - [address] [place] [address] Phone: [phone] Fax: [phone]  Is this the correct pharmacy for this prescription? Yes If no, delete pharmacy and type the correct one.   Has the prescription been filled recently? No  Is the patient out of the medication? Yes  Has the patient been seen for an appointment in the last year OR does the patient have an upcoming appointment? Yes  Can we respond through MyChart? No  Agent: Please be advised that Rx refills may take up to 3 business days. We ask that you follow-up with your pharmacy.

## 2024-03-24 DIAGNOSIS — Z8673 Personal history of transient ischemic attack (TIA), and cerebral infarction without residual deficits: Secondary | ICD-10-CM | POA: Insufficient documentation

## 2024-03-24 DIAGNOSIS — I63431 Cerebral infarction due to embolism of right posterior cerebral artery: Secondary | ICD-10-CM | POA: Insufficient documentation

## 2024-03-25 NOTE — Progress Notes (Signed)
 [facility]                 Focused Occupational Therapy - Initial Evaluation  Patient Name:  [name] Date of Birth:  Jul 05, 1961  Today's Date: March 25, 2024  OT Diagnosis: Unsteadiness on feet;Other (impaired vision)  Assessment    Prior level of function/ADL assistance Current level of functional assistance Current level of ADL assistance Current functional limitations/ impairments Rehab potential  Modified independent with functional mobility, Independent with ADLs, Independent with iADLs   Min-light physical assist Min-light physical assist Decreased functional mobility;Decreased activity tolerance;Impaired balance;Decreased ADL status (impaired vision) Good    35 female, transferred from OSH after R PCA embolism found on imaging.  PLOF occasional use of rollator for moderate distances, no AD for shorter distances.  Pt reports able to manage ADLs mod I, supv during shower for safety provided by daughter.  Pt indep wtih driving and med management.  Pt currently WNL for cognition, BUE AROM and strength, and min A for standing/few steps L and R (limited d/t lines and BP ), though found to have L upper visual field loss.  Pt would benefit from skilled OT and post acute rehab to maximize independence with functional tasks and safety with those tasks.  Plan for Current Admission   Needs during current admission Treatment/Interventions OT Frequency Duration of Treatment  Continued skilled OT Self care/ADL;Home management/safety;Patient/support person education;Functional transfer training;Therapeutic exercise;Visual perceptual training;Therapeutic activity;Compensatory technique education 2 additional visits  Duration of Treatment start date:: 04/24/24 Duration of Treatment end date:: 04/24/24    Recommendations for Discharge   Anticipated intensity of rehab at next level of care Anticipated caregiver needs at next level of care DME Recommendations  Multiple  times per week OP OT (if pt has transportation (driving not recommended at this time)) Full time indirect  Shower chair;Bedside commode    Barriers to Discharge Home      Recommendations above align with AMPAC score of Daily Activity 20.     Recommendations provided are based on today's OT assessment, however, final discharge decisions are based on input from the interdisciplinary care team and may differ from these recommendations.   Subjective  My son stays at home, he is disabled-- pt clarified that son is deaf, but can assist 24/7 if pt needs.    Problem List Problem List[1] History Past Medical History:  Diagnosis Date  . Hyperlipidemia   . Hypertension    No past surgical history on file.  Objective  Spoke with nursing.  Nursing cleared patient to participate in therapy.    Patient left in bed with all lines/leads intact. Call bell and other needs in reach and bed alarm in place and engaged.  Family present for session? no  Activity Tolerance: 116/59 supine, 99/67 sitting  Precautions  Fall precautions: Yes Other Precautions: ICU lines/leads, O2, PIV LUE, L upper quadrant visual field loss Precautions discussed with:: Patient;Nurse  Home Living/Prior Function: lives with son who is disabled (hearing impaired- does not work) and daughter who works.  Lives in trailer, 1 level and 0STE.  Pt independent with mobility for short household distances, mod I using rollator for moderate distances or community.  Mostly independent with ADls- daughter supv for showers.  Pt indep with med management, daughter cooks, cleans.   DME: rollator  Pain (Prior to intervention)     WFL ABN COMMENTS  Cognition x  MOCA:          SBT:  ACL:         PILL BOX:      A&O x4  Follows commands Recalls information well Futher testing if needed  P/AROM x  BUE WNL  Strength    x  BUE equal, 5/5  Sensation x    Coordination x  Finger opposition  Vision  x Acuity:   able to read  clock                      Tracking: WNL          Field: L upper quadrant loss Diplopia present? no   Perception   Depth Perception:                  R/L Discrimination:                                      Spatial Relations:                                                                    Neglect or hemi-inattention:   Self-Care:           Eating   Pt feeding self when entering room       Grooming          Bathing          Dressing-upper body          Dressing-lower body  x Min A for socks EOB       Toileting     Balance:               Sitting  x SBA sitting EOB, no LOB       Standing  x Min A, HHA standing   ADL transfers  x Lateral stepping R and L with min A, HHA    Today's Treatment Education on visual field loss and use of scanning for compensatory strategy.  Pt able to scan to find clock on wall on L with no cues.  Discussion of safety with visual field impairment.   Functional Tests   AM-PAC Daily Activity Putting on and taking off regular lower body clothing?: 3 Bathing (including washing, rinsing, drying)?: 3 Toileting, which includes using toilet, bedpan or urinal?: 3 Putting on and taking off regular upper body clothing? : 3 Taking care of personal grooming such as brushing teeth/: 4 Eating meals: 4 AM-PAC Daily Activity Raw Score (out of 24): 20   Goals      Occupational Therapy Care Plan (Active)     Problem: Functional Transfers, Impaired     Dates: Start:  03/25/24       Goal: Other (edit field to enter specific information)     Dates: Start:  03/25/24    Expected End:  04/24/24      Description: Pt will complete ADL transfer with mod I, by 04/24/24.         Problem: Other (edit field to enter specific information)     Dates: Start:  03/25/24       Goal: Other (edit field to enter specific information)     Dates: Start:  03/25/24  Expected End:  04/24/24      Description: Pt will complete ADL task sitting/standing at sink with mod I, by  04/24/24.         Problem: Visual/Perceptual Function, Impaired     Dates: Start:  03/25/24       Goal: Other (edit field to enter specific information)     Dates: Start:  03/25/24    Expected End:  04/24/24      Description: Pt will complete functional iADL task with no cues for using compensatory strategies with visual impairment, by 04/24/24.          Key (I=independent, ModI=modified independent, S=supervision, CGA=contact guard assist, Min=minimal assist, Mod=moderate assist, Max=maximal assist, D= dependent)   Plan of Care was discussed with and agreed upon by Patient/Caregiver: Yes Patient/Caregiver reported cultural and/or religious practices that should be considered in Patient's Treatment Plan:   Patient/Caregiver was informed regarding risks and benefits of treatment:     Evaluation / Treatment Time  Today's Evaluation/Treatment: 1358 - 1439 Total Time: 41 Treatment Day: 1    Charges  Total Time Code Treatment Minutes: 15  Evaluation Charges $ OT Evaluation: High Complex Therapeutic Charges $ Self care/Home management training: 1 unit  Lydia K Schindler, OTR/L 03/25/2024 3:06 PM     [1] Patient Active Problem List Diagnosis  . Dyspnea on exertion  . Mixed hyperlipidemia  . Primary hypertension  . Coronary artery disease involving native coronary artery of native heart without angina pectoris  . Persistent atrial fibrillation (*)  . History of cardiomyopathy  . Cerebrovascular accident (CVA) due to embolism of right posterior cerebral artery (*)  . Tobacco use disorder  . Left carotid artery occlusion  . PAF (paroxysmal atrial fibrillation) (*)

## 2024-03-26 DIAGNOSIS — N3001 Acute cystitis with hematuria: Secondary | ICD-10-CM | POA: Insufficient documentation

## 2024-03-26 NOTE — Progress Notes (Signed)
 [facility] Focused Physical Therapy - Initial Evaluation  Patient Name:  [name] Date of Birth:  03-04-1961  Today's Date: March 26, 2024  PT Diagnosis: Other abnormalities of gait and mobility;Dizziness and giddiness;Other symptoms and signs involving the nervous system;Difficulty walking;Unsteadiness on feet  Assessment    Prior level of function Current level of mobility Current functional limitations/ impairments Rehab potential  Independent with ambulation, Independent with functional transfers, Modified Independent with ambulation, Modified Independent with functional transfers   Supervision (verbal cues/ setup/ safety);Min- light physical assist CGA/HHA compensating for L knee issues chronic Fall risk;Decreased functional mobility;Decreased activity tolerance;Gait instability;Impaired balance Good   Patient is a pleasant 63 y.o female admitted 03/24/24 due to L visual deficit from OSH. MRI per Neuro: acute right PCA distribution infarct without hemorrhage as well as widespread microvascular changes and chronic left frontal infarct.  MR angio head Chronic left ICA occlusion and right mid posterior cerebral artery occlusion. Pt with PMH noted below sig for CEA x2, smoking, HLD, HTN, CAD, PAF, cardiomyopathy. Pt's PLOF indep/mod I ADL's and using 4WRW moderate distances, lives with children who can assist at d/c as needed. Patient presents with impaired tracking L eye, + saccades and dizziness with transitional movements sitting/standing. Pt reports chronic L knee deficits causing antalgic gait for which she holds to furniture/walls in home or uses 4WRW. Pt required HHA/CGA for mobility short distances this date. PT recommend post acute PT at d/c.  Plan for Current Admission   Needs during current admission Treatment/Interventions PT Frequency Duration of Treatment  Discontinue PT             Recommendations for Discharge   Anticipated intensity of  rehab at next level of care Anticipated caregiver needs at next level of care DME Recommendations  Multiple times per week OP PT Intermittent assistance  Standard walker;Bedside commode;Other (comment) Public relations account executive)    Barriers to Discharge Home  Decreased activity tolerance requiring frequent rest breaks   Recommendations above align with AMPAC score of Basic Mobility 22.  Recommendations provided are based on today's PT assessment, however, final discharge decisions are based on input from the interdisciplinary care team and may differ from these recommendations.  Subjective  Pt pleasant and agreeable, Do you know where I will get my presriptions before I leave?   History of Present Illness/Pertinent Information: n/a  Problem List Problem List[1] History Past Medical History:  Diagnosis Date  . Hyperlipidemia   . Hypertension    No past surgical history on file.  Objective  Spoke with nursing.  Nursing cleared patient to participate in therapy.    Patient left in bed with all lines/leads intact. Call bell and other needs in reach and bed alarm not required, fall risk score < 50 at time of intervention.  Family present for session? Yes (Cousin)   Activity Tolerance: see flow sheet; BP elevated, did not read on LUE sitting EOB, dizziness with transitional movements sitting/standing resolved quickly with short rest     Precautions  Other Precautions: Fall Risk, PIV, R PCA w/L visual deficits/saccades and dizziness; chronic L knee pain/deficits per pt Precautions discussed with:: Patient;Nurse;Support person    Home Living/Prior Function: Mobile home, no STE, lives with dtr and disabled son. Pt mod I ADL's, dtr supervises shower and uses 4WRW longer distances, holds to wall/furniture in home   DME: 4WRW  Pain  (Prior to intervention) a few on a scale of 10.  Location/Description: chronic issues, L knee Intervention  provided: PT to tolerance   Pain (Post  intervention) a few on a scale of 10.  Location/Description: same Intervention provided: PT to tolerance     [facility] ABN COMMENTS  Orientation/Command Following  x A/O, perseverates on medication needs, tangential, decreased insight, anxious.  ROM  x Impaired end ranges, body morphology, L knee chronic issues per pt  Strength  x LLE antalgic, 4/5; RLE 4+/5  Sensation x    Bed Mobility Supine ---- Sit x  Increased time, dyspnea with exertion, smoking hx, mod I  Balance Sitting x     Standing  x Mild instability, favors RLE, antalgic stepping/WB LLE, CGA/HHA  Transfers Sit ---- Stand  x UE support, no AD, CGA/SBA for safety from EOB   Bed ---- Chair     Gait            Distance, Assistance and Assistive Device  x ~44ft in room w/o AD, RUE HHA, antalgic stepping favoring RLE due to L knee chronic issues per pt. No observable buckling or coordination deficit noted. No major LOB. Gait is slower and compensatory.   Stairs     Today's Treatment   PT providing education on role of acute PT, PT POC, acute mobility recommendations, home safety recommendations including use of walker at all times currently and PT follow up recommended, use of call bell for staff assistance, and d/c recs.  Pt/cousin appeared to understand    Functional Tests AM-PAC Basic Mobility Turning from your back to your side while in a flat bed without using bedrails?: None Moving from lying on your back to sitting on the side of a flat bed without using bedrails?: None Moving to and from a bed to a chair (including a wheelchair)?: None Standing up from a chair using your arms (e.g., wheelchair, or bedside chair)? : None Walking in hospital room?: A little Climbing 3-5 steps with a railing?: A little AM-PAC Basic Mobility Raw Score (out of 24): 22 Routine Mobility Goal: Walk 25 Feet or More & All ADLs in Bathroom After Gathering Supplies in Room 7 Mini-Vestibular Assessment Spontaneous Nystagmus: Normal Smooth pursuit:  Abnormal Convergence: Abnormal Saccade: Abnormal   Goals  PT - Patient/Support Person Stated Goals: Home today   Physical Therapy Care Plan (Active)     There are no active problems.        Key (I=independent, ModI=modified independent, S=supervision, CGA=contact guard assist, Min=minimal assist, Mod=moderate assist, Max=maximal assist, D= dependent)    Plan of Care was discussed with and agreed upon by Patient/Caregiver: Yes  Evaluation / Treatment Time  Today's Evaluation/Treatment: 0909 - 9073 Total Time: 17 min Treatment Day: 1   Charges  Total Time Code Treatment Minutes: <36min  Evaluation Charges $ PT Evaluation: Mod Complex  Albert CHRISTELLA Mutters, PT, DPT 03/26/2024 9:26 AM     [1] Patient Active Problem List Diagnosis  . Dyspnea on exertion  . Mixed hyperlipidemia  . Primary hypertension  . Coronary artery disease involving native coronary artery of native heart without angina pectoris  . Persistent atrial fibrillation (*)  . History of cardiomyopathy  . Cerebrovascular accident (CVA) due to embolism of right posterior cerebral artery (*)  . Tobacco use disorder  . Left carotid artery occlusion  . PAF (paroxysmal atrial fibrillation) (*)  . Acute cystitis with hematuria  . Community acquired pneumonia

## 2024-03-26 NOTE — Nursing Note (Signed)
 AVS and education reviewed w/ patient about medication to take, follow up appt, and information regarding CVA. Pt verbalized understanding of instructions. All questions answered. IV removed w/o issue. All belongings returned to pt. Family sent downstairs to get the private vehicle. Staff to bring patient to [facility] lobby.  Lionel Lipoma, RN
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
## 2024-04-11 ENCOUNTER — Ambulatory Visit: Attending: Nurse Practitioner | Admitting: Physical Therapy

## 2024-04-11 ENCOUNTER — Encounter: Payer: Self-pay | Admitting: Physical Therapy

## 2024-04-11 DIAGNOSIS — M6281 Muscle weakness (generalized): Secondary | ICD-10-CM | POA: Diagnosis present

## 2024-04-11 DIAGNOSIS — R2689 Other abnormalities of gait and mobility: Secondary | ICD-10-CM | POA: Insufficient documentation

## 2024-04-18 ENCOUNTER — Telehealth: Payer: Self-pay | Admitting: Family Medicine

## 2024-04-18 NOTE — Telephone Encounter (Signed)
 Copied from CRM [id number]. Topic: Referral - Request for Referral >> Apr 18, 2024  8:54 AM [name] wrote: Did the patient discuss referral with their provider in the last year? Yes, she has the earliest available appointment.   Appointment offered? Yes  Type of order/referral and detailed reason for visit: The patient had a stroke and was discharged 6/29 and was referred to a Neurologist but they are in [place] but she lives in [place].  Preference of office, provider, location: [place] or [place].  If referral order, have you been seen by this specialty before? No  Can we respond through MyChart? No

## 2024-04-18 NOTE — Telephone Encounter (Signed)
 Please advise, okay to overbook with provider.  See below.  Copied from CRM [id number]. Topic: Appointments - Scheduling Inquiry for Clinic >> Apr 18, 2024  8:58 AM [name] wrote: Reason for CRM: The patient had a stroke and was discharged from the Hospital on June 29th so it is too late for a hospital follow-up. However, she was going to go to the beach August 5th but wanted to be cleared by FNP Hilario before going. She has the earliest available appointment scheduled on my end at this time. The patient's call back number is [phone].

## 2024-04-20 ENCOUNTER — Encounter: Admitting: Physical Therapy

## 2024-04-20 NOTE — Telephone Encounter (Signed)
 Please inform patient  neurology services are not available in [place] or [place]. If you're interested, I'd be happy to place a referral to a neurology provider in [place], where those services are available.  Please let me know if you'd like to move forward with that referral

## 2024-04-25 ENCOUNTER — Telehealth: Payer: Self-pay | Admitting: Family Medicine

## 2024-04-25 ENCOUNTER — Ambulatory Visit: Payer: Self-pay

## 2024-04-25 NOTE — Telephone Encounter (Signed)
 Not on patient med list, I would advise patient to follow up for cardiology for this medication

## 2024-04-25 NOTE — Telephone Encounter (Unsigned)
 Copied from CRM [id number]. Topic: Clinical - Medication Refill >> Apr 25, 2024  9:47 AM Annabella S wrote: Medication: Eliquis 5mg   Has the patient contacted their pharmacy? Yes (Agent: If no, request that the patient contact the pharmacy for the refill. If patient does not wish to contact the pharmacy document the reason why and proceed with request.) (Agent: If yes, when and what did the pharmacy advise?)  This is the patient's preferred pharmacy:   [id number] - [place], [place] - [address] [place] [address] Phone: [phone] Fax: [phone]  Is this the correct pharmacy for this prescription? Yes If no, delete pharmacy and type the correct one.   Has the prescription been filled recently? Yes  Is the patient out of the medication? Yes  Has the patient been seen for an appointment in the last year OR does the patient have an upcoming appointment? Yes  Can we respond through MyChart? Yes  Agent: Please be advised that Rx refills may take up to 3 business days. We ask that you follow-up with your pharmacy.

## 2024-04-25 NOTE — Telephone Encounter (Unsigned)
 Copied from CRM [id number]. Topic: Clinical - Medication Refill >> Apr 25, 2024 10:09 AM Tiffany S wrote: Medication: Medication: Eliquis 5mg   Has the patient contacted their pharmacy? Yes (Agent: If no, request that the patient contact the pharmacy for the refill. If patient does not wish to contact the pharmacy document the reason why and proceed with request.) (Agent: If yes, when and what did the pharmacy advise?)  This is the patient's preferred pharmacy:   [id number] - [place], [place] - [address] [place] [address] Phone: [phone] Fax: [phone]  Is this the correct pharmacy for this prescription? Yes If no, delete pharmacy and type the correct one.   Has the prescription been filled recently? Yes  Is the patient out of the medication? Yes  Has the patient been seen for an appointment in the last year OR does the patient have an upcoming appointment? Yes  Can we respond through MyChart? Yes  Agent: Please be advised that Rx refills may take up to 3 business days. We ask that you follow-up with your pharmacy.

## 2024-04-25 NOTE — Telephone Encounter (Signed)
 FYI Only or Action Required?: FYI only for provider.  Patient was last seen in primary care on 01/06/2024 by Terry Wilhelmena Lloyd Hilario, FNP.  Called Nurse Triage reporting Dizziness.  Symptoms began about a month ago.  Symptoms are: stable.  Triage Disposition: See PCP Within 2 Weeks  Patient/caregiver understands and will follow disposition?: Yes     Copied from CRM [id number]. Topic: Clinical - Red Word Triage >> Apr 25, 2024 10:26 AM Antwanette L wrote: Red Word that prompted transfer to Nurse Triage: pt is experiencing dizziness every time she stands up. Pt was discharged on 6/29 for [facility]. Pt needs a hospital f/u     Reason for Disposition  [1] MILD dizziness (e.g., walking normally) AND [2] has been evaluated by doctor (or NP/PA) for this  Answer Assessment - Initial Assessment Questions Patient seen last month for a CVA and did not have hospital follow up scheduled. Patient states she has some mild dizziness when standing that resolves quickly which has been present since she was in the hospital. She denies any new symptoms. Earliest appointment scheduled for 8/8.     1. DESCRIPTION: Describe your dizziness.     Dizziness when standing  2. LIGHTHEADED: Do you feel lightheaded? (e.g., somewhat faint, woozy, weak upon standing)     Yes 3. VERTIGO: Do you feel like either you or the room is spinning or tilting? (i.e., vertigo)     No 4. SEVERITY: How bad is it?  Do you feel like you are going to faint? Can you stand and walk?     Mild 5. ONSET:  When did the dizziness begin?     Since her CVA last month  6. AGGRAVATING FACTORS: Does anything make it worse? (e.g., standing, change in head position)     Standing  7. HEART RATE: Can you tell me your heart rate? How many beats in 15 seconds?  (Note: Not all patients can do this.)       No 10. OTHER SYMPTOMS: Do you have any other symptoms? (e.g., fever, chest pain, vomiting, diarrhea,  bleeding)       No  Protocols used: Dizziness - Lightheadedness-A-AH

## 2024-04-26 ENCOUNTER — Other Ambulatory Visit: Payer: Self-pay | Admitting: Family Medicine

## 2024-04-26 ENCOUNTER — Ambulatory Visit: Admitting: Physical Therapy

## 2024-04-26 ENCOUNTER — Encounter: Payer: Self-pay | Admitting: Physical Therapy

## 2024-04-26 ENCOUNTER — Telehealth: Payer: Self-pay

## 2024-04-26 ENCOUNTER — Ambulatory Visit: Payer: Self-pay

## 2024-04-26 ENCOUNTER — Encounter: Admitting: Physical Therapy

## 2024-04-26 DIAGNOSIS — M6281 Muscle weakness (generalized): Secondary | ICD-10-CM

## 2024-04-26 DIAGNOSIS — R2689 Other abnormalities of gait and mobility: Secondary | ICD-10-CM

## 2024-04-26 MED ORDER — NICOTINE 14 MG/24HR TD PT24: 14.0000 mg | MEDICATED_PATCH | Freq: Every day | TRANSDERMAL | 0 refills | Status: DC

## 2024-04-26 MED ORDER — ASPIRIN 81 MG PO TBEC: 81.0000 mg | DELAYED_RELEASE_TABLET | Freq: Every day | ORAL | 3 refills | Status: AC

## 2024-04-26 NOTE — Telephone Encounter (Signed)
 Unable to leave vm.

## 2024-04-26 NOTE — Telephone Encounter (Signed)
 FYI Only or Action Required?: Action required by provider: medication refill request.  Patient was last seen in primary care on 01/06/2024 by Del Wilhelmena Lloyd Sola, FNP.  Called Nurse Triage reporting Medication Refill.  Triage Disposition: Call PCP Now  Copied from CRM [id number]. Topic: Clinical - Red Word Triage >> Apr 26, 2024  2:26 PM Avram MATSU wrote: Red Word that prompted transfer to Nurse Triage: patient stated she take blood thinners, stated the medication is named eliquis 5mg  2 a day. She is feeling dizzy and recently had a stroke.     ----------------------------------------------------------------------- From previous Reason for Contact - Prescription Issue: Reason for CRM: patient Reason for Disposition  [1] Prescription refill request for ESSENTIAL medicine (i.e., likelihood of harm to patient if not taken) AND [2] triager unable to refill per department policy  Answer Assessment - Initial Assessment Questions 1. DRUG NAME: What medicine do you need to have refilled?     ASA, Eliquis 5 mg 2 a day, nicotine  patches 4. PRESCRIBER: Who prescribed it? Note: The prescribing doctor or group is responsible for refill approvals..     States the eliquis is new and the hospital prescribed the med 5. PHARMACY: Have you contacted your pharmacy (drugstore)? Note: Some pharmacies will contact the doctor (or NP/PA).      Yes was told to contact PCP  Pt was triaged for dizziness yesterday, denies worsening s/s. States she is calling for her medication refills.  Protocols used: Medication Refill and Renewal Call-A-AH

## 2024-04-26 NOTE — Telephone Encounter (Signed)
 Unable to leave vm, per iliana patient needs to request these from cardiology

## 2024-04-26 NOTE — Telephone Encounter (Signed)
 Patient needs to speak to cardiology for Eliquis refill, I sent nicotine  and Asprin

## 2024-04-26 NOTE — Telephone Encounter (Signed)
 Patient calling to follow up on rx request. Advised of Heather's message. Patient stated okay and disconnected.

## 2024-04-26 NOTE — Therapy (Signed)
 OUTPATIENT PHYSICAL THERAPY TREATMENT   Patient Name: [name] MRN: [id number] DOB:[birth year], 63 y.o., female Today's Date: 04/26/2024  END OF SESSION:  PT End of Session - 04/26/24 1414     Visit Number 2    Number of Visits 12    Date for PT Re-Evaluation 05/23/24    Authorization Type MCR MCD    PT Start Time 1331    PT Stop Time 1411    PT Time Calculation (min) 40 min    Activity Tolerance Patient tolerated treatment well    Behavior During Therapy [facility] for tasks assessed/performed           Past Medical History:  Diagnosis Date   AAA (abdominal aortic aneurysm) without rupture (HCC) 08/25/2016   Anxiety    Arthritis    Bipolar 1 disorder (HCC)    CAD (coronary artery disease)    a. 08/08/16 LAD 70%, 60% 1st diag, CTO RCA Med Rx EF 55%   Carotid artery stenosis    Right carotid endarterectomy 07/22/05 with recurrent stenosis 01/2017; known left ICA occlusion   COPD (chronic obstructive pulmonary disease) (HCC)    Crack cocaine use    Depression    Headache    Hypertension    Myocardial infarction [facility])    Right Groin Hematoma    a. 07/2016 following diagnostic cath   Stroke [facility]) 2017   Past Surgical History:  Procedure Laterality Date   CARDIAC CATHETERIZATION N/A 08/07/2016   Procedure: Left Heart Cath and Coronary Angiography;  Surgeon: Debby DELENA Sor, MD;  Location: [facility] INVASIVE CV LAB;  Service: Cardiovascular;  Laterality: N/A;   CYST REMOVAL TRUNK Left    beneath L breast   ENDARTERECTOMY Right 02/08/2018   Procedure: ENDARTERECTOMY CAROTID REDO;  Surgeon: Eliza Lonni RAMAN, MD;  [facility] OR;  Service: Vascular;  Laterality: Right;   TUBAL LIGATION     VASCULAR SURGERY Right    right carotid endarterectomy 07/22/05    Patient Active Problem List   Diagnosis Date Noted   Prediabetes 07/30/2023   Hyperlipidemia 07/30/2023   Cigarette smoker 05/25/2023   AKI (acute kidney injury) (HCC) 05/23/2023   CAP (community acquired pneumonia)  05/23/2023   Leukocytosis 05/23/2023   Acute respiratory failure with hypoxia and hypercarbia (HCC) 05/23/2023   Odynophagia 05/18/2023   GERD (gastroesophageal reflux disease) 05/18/2023   History of COPD 05/06/2023   Chronic bronchitis (HCC) 04/28/2023   Dysphagia 04/28/2023   Gastroenteritis 03/02/2023   Spinal stenosis of lumbar region 03/02/2023   PVD (peripheral vascular disease) (HCC) 12/24/2021   Carotid stenosis 02/08/2018   AAA (abdominal aortic aneurysm) without rupture (HCC) 08/25/2016   Carotid disease, bilateral (HCC) 08/18/2016   Cardiomyopathy, ischemic 08/18/2016   CAD (coronary artery disease) 08/08/2016   Acute blood loss anemia 08/08/2016   Dyslipidemia 08/08/2016   Bipolar 1 disorder (HCC)    Essential hypertension    Anxiety    Crack cocaine use    Tobacco abuse    Right Groin Hematoma    Chest pain 08/07/2016   Unstable angina pectoris (HCC) 08/06/2016    PCP: Terry Wilhelmena Lloyd Hilario, FNP   REFERRING PROVIDER: Delight Mom   REFERRING DIAG: [phone] (ICD-10-CM) - Cerebral infarction due to embolism of right posterior cerebral artery   Rationale for Evaluation and Treatment:  Rehabiliation  THERAPY DIAG:  Muscle weakness (generalized)  Other abnormalities of gait and mobility  ONSET DATE: 03/21/24 CVA   SUBJECTIVE:  SUBJECTIVE STATEMENT: She relays about the same, decided she would come back to give PT a try.   PERTINENT HISTORY:  History of Present Illness: [name] is a 63 y.o. female with a past medical history of essential hypertension, bilateral carotid artery disease (including left common carotid occlusion and prior right CEA), infrarenal abdominal aortic aneurysm (now measuring 5 cm), non-insulin -dependent type 2 diabetes, ongoing tobacco abuse,  and prior substance abuse (quit approximately 5 years ago), cardiomyopathy with LV recovery, hyperlipidemia, and tobacco abuse who presented to the emergency department at [facility] on 03/24/2024 complaining of a 1 day history of sudden onset of visual field deficit. NIHSS was 1. Head CT at the outside hospital revealed right occipital ischemic changes. CTA head and neck showed a right mid posterior cerebral artery occlusion and LEFT ICA occlusion. She was then transferred to [facility] and admitted to the Neurology service for further management and stroke work up.   PAIN:  NPRS scale: 7/10 in general since stroke, reports increased headaches since stroke Pain description: constant, achy, sharp Aggravating factors: walking Relieving factors: rest,sitting   PRECAUTIONS: ,  None  RED FLAGS: None   WEIGHT BEARING RESTRICTIONS:  No  FALLS:  Has patient fallen in last 6 months? No   OCCUPATION:  None, disability  PLOF:  She can shower herself and use the bathroom herself but mostly Needs assistance with ADLs  PATIENT GOALS:  Walking more.   OBJECTIVE:  Note: Objective measures were completed at Evaluation unless otherwise noted.  DIAGNOSTIC FINDINGS:  MRI IMPRESSION:   1. Acute RIGHT PCA distribution infarct. No hemorrhage.  2. Occlusion of the LEFT ICA.  3. Old LEFT frontal infarct. Additional old lacunar infarcts.  4. Cerebral atrophy with moderate white matter disease reflecting gliosis secondary to chronic microvascular ischemia.   PATIENT SURVEYS:  Patient-Specific Activity Scoring Scheme  0 represents "unable to perform." 10 represents "able to perform at prior level. 0 1 2 3 4 5 6 7 8 9  10 (Date and Score)   Activity Eval     1. Sit to chair 7     2. Walking 5 minutes 2     3.     4.    5.    Score 4.5/10    Total score = sum of the activity scores/number of activities Minimum detectable change (90%CI) for average  score = 2 points Minimum detectable change (90%CI) for single activity score = 3 points   GAIT: Distance walked: limited to household distances due to pain and weakness Assistive device utilized: Environmental consultant - 4 wheeled Level of assistance: SBA   Visual changes: reports blurry vision since stroke  Sensation: WNL  Memory: reports issues since stroke   UPPER EXTREMITY ROM: WNL    UPPER EXTREMITY FFU:hmnddob 5/5 MMT bilat     LOWER EXTREMITY MMT:    MMT Right eval Left eval  Hip flexion 4 3  Hip extension    Hip abduction    Hip adduction    Hip internal rotation    Hip external rotation    Knee flexion 5 4  Knee extension 5 4  Ankle dorsiflexion    Ankle plantarflexion    Ankle inversion    Ankle eversion     (Blank rows = not tested)   FUNCTIONAL TESTS:  Eval: 30 second chair stand test: 6 reps 2 minute walk test: 111 feet, one seated rest break needed and unable to continue until 2  min was up. Used RW and CGA needed                                                                                                                              TREATMENT DATE:  04/26/24 Therex: HEP review and education Nu step L3 X 8 min, one rest break needed Seated SLR 2X10 bilat Seated LAQ with red band 2X10  Neuro re-ed Gaze stabilization horizontal 30 sec X 2 Gaze stabilization vertical 30 sec X 2 Education, cuing and demo for proper technique for home, and rationale  Theractivity: Wheelchair to chair transfer, stand pivot transfer to and from with Supervison Ambulation with RW, 115 feet X 2 with supervision seated rest break in between (HR85 and SPO2 98% after each round) Sit to stand from wheelchair with UE support X 4 reps during session, supervision     PATIENT EDUCATION: Education details: HEP, PT plan of care, selfcare Person educated: Patient Education method: Explanation, Demonstration, Verbal cues, and Handouts Education comprehension: verbalized  understanding, further education recommended   HOME EXERCISE PROGRAM: Access Code: [email or web address] URL: [email or web address] Date: 04/11/2024 Prepared by: Redell Moose  Exercises - Seated Straight Leg Raise with Quad Contraction  - 2 x daily - 6 x weekly - 3 sets - 5 reps - Seated Knee Extension with Resistance  - 2 x daily - 6 x weekly - 3 sets - 10 reps - Single Leg Sit to Stand with Arm Support  - 2 x daily - 6 x weekly - 2 sets - 5 reps - alternating marching  - 2 x daily - 6 x weekly - 1 sets - 10 reps - Seated Gaze Stabilization with Head Rotation  - 2 x daily - 6 x weekly - 1 sets - 2 reps - 30 sec hold - Seated Gaze Stabilization with Head Nod and Vertical Arm Movement  - 2 x daily - 6 x weekly - 1 sets - 2 reps - 30 sec hold - Standing Romberg to 1/2 Tandem Stance  - 2 x daily - 6 x weekly - 1 sets - 3 reps - 15 sec hold  ASSESSMENT:  CLINICAL IMPRESSION: She has not seen neurologist for follow up since stroke and will do this next week she reports. PT again discussed the importance of PT and exercise to help regain her abilities post stroke. Despite not working on HEP yet, she did show improved ability to lift her left leg today and her ambulation tolerance was also better than it was at eval.   OBJECTIVE IMPAIRMENTS: decreased activity tolerance for ADL's, difficulty walking, decreased balance, decreased endurance, decreased mobility, decreased ROM, decreased strength, impaired flexibility, and pain.  ACTIVITY LIMITATIONS: bending, lifting, carry, locomotion, standing, walking, vision   PERSONAL FACTORS: see above PMH  also affecting patient's functional outcome.  REHAB POTENTIAL: Fair , limited by lack of motivation to attend PT  CLINICAL DECISION MAKING: Evolving/moderate complexity  EVALUATION COMPLEXITY: Moderate  GOALS: Short term PT Goals Target date: 05/09/2024   Pt will be I and compliant with HEP. Baseline:  Goal status: New  Long term PT goals  Target date:05/23/2024 Pt will improve leg strength to at least 4+/5 MMT to improve functional strength Baseline: Goal status: New Pt will improve Patient specific functional scale (PSFS) to at least 6/10 to show improved function level Baseline: Goal status: New Pt will improve 30 second chair stand test to 9 or more reps Baseline:6 Goal status: New Pt will be able to ambulate at least 150 feet on 2 minute walk test. Baseline:111 Goal status: New  PLAN: PT FREQUENCY: 1-3 times per week   PT DURATION: 6 weeks  PLANNED INTERVENTIONS (unless contraindicated):  97110-Therapeutic exercises, 97530- Therapeutic activity, 97112- Neuromuscular re-education, 97535- Self Care, [id number]- Manual therapy, 97116- Gait training, Balance training, Vestibular training, and Visual/preceptual remediation/compensation  PLAN FOR NEXT SESSION:  work on leg strength, gait, balance, gaze stabilization   Redell JONELLE Moose, PT,DPT 04/26/2024, 2:15 PM

## 2024-04-26 NOTE — Telephone Encounter (Signed)
 Unable to lvm

## 2024-04-26 NOTE — Telephone Encounter (Signed)
 Copied from CRM [id number]. Topic: Clinical - Medication Question >> Apr 26, 2024 10:59 AM Sophia H wrote: Reason for CRM: Patient states she is out of medications prescribed after a stroke on 06/27, needing refill of apixaban 5 mg tablet, Commonly known as: ELIQUIS, nicotine  21 mg/24 hours & aspirin  81 mg chewable tablet. I do not see these medications listed in her chart only in notes from 06/27, she states that her pharmacy is unable to request the medications since it is from the hospital and want her follow up with PCP. Patient does have an appointment in August to follow up regarding her stroke. Please advise # [phone]    [id number] - [place], [place] - [address]

## 2024-04-30 ENCOUNTER — Other Ambulatory Visit: Payer: Self-pay | Admitting: Family Medicine

## 2024-05-03 ENCOUNTER — Encounter: Payer: Self-pay | Admitting: Physical Therapy

## 2024-05-03 ENCOUNTER — Ambulatory Visit: Attending: Nurse Practitioner | Admitting: Physical Therapy

## 2024-05-03 DIAGNOSIS — R2689 Other abnormalities of gait and mobility: Secondary | ICD-10-CM | POA: Diagnosis present

## 2024-05-03 DIAGNOSIS — M6281 Muscle weakness (generalized): Secondary | ICD-10-CM | POA: Insufficient documentation

## 2024-05-03 NOTE — Therapy (Signed)
 OUTPATIENT PHYSICAL THERAPY TREATMENT   Patient Name: [name] MRN: [id number] DOB:07/22/1961, 63 y.o., female Today's Date: 05/03/2024  END OF SESSION:  PT End of Session - 05/03/24 1445     Visit Number 3    Number of Visits 12    Date for PT Re-Evaluation 05/23/24    Authorization Type MCR MCD    PT Start Time 1345    PT Stop Time 1423    PT Time Calculation (min) 38 min    Activity Tolerance Patient tolerated treatment well    Behavior During Therapy [facility] for tasks assessed/performed            Past Medical History:  Diagnosis Date   AAA (abdominal aortic aneurysm) without rupture (HCC) 08/25/2016   Anxiety    Arthritis    Bipolar 1 disorder (HCC)    CAD (coronary artery disease)    a. 08/08/16 LAD 70%, 60% 1st diag, CTO RCA Med Rx EF 55%   Carotid artery stenosis    Right carotid endarterectomy 07/22/05 with recurrent stenosis 01/2017; known left ICA occlusion   COPD (chronic obstructive pulmonary disease) (HCC)    Crack cocaine use    Depression    Headache    Hypertension    Myocardial infarction [facility])    Right Groin Hematoma    a. 07/2016 following diagnostic cath   Stroke [facility]) 2017   Past Surgical History:  Procedure Laterality Date   CARDIAC CATHETERIZATION N/A 08/07/2016   Procedure: Left Heart Cath and Coronary Angiography;  Surgeon: Debby DELENA Sor, MD;  Location: [facility] INVASIVE CV LAB;  Service: Cardiovascular;  Laterality: N/A;   CYST REMOVAL TRUNK Left    beneath L breast   ENDARTERECTOMY Right 02/08/2018   Procedure: ENDARTERECTOMY CAROTID REDO;  Surgeon: Eliza Lonni RAMAN, MD;  Location: [facility] OR;  Service: Vascular;  Laterality: Right;   TUBAL LIGATION     VASCULAR SURGERY Right    right carotid endarterectomy 07/22/05    Patient Active Problem List   Diagnosis Date Noted   Prediabetes 07/30/2023   Hyperlipidemia 07/30/2023   Cigarette smoker 05/25/2023   AKI (acute kidney injury) (HCC) 05/23/2023   CAP (community acquired  pneumonia) 05/23/2023   Leukocytosis 05/23/2023   Acute respiratory failure with hypoxia and hypercarbia (HCC) 05/23/2023   Odynophagia 05/18/2023   GERD (gastroesophageal reflux disease) 05/18/2023   History of COPD 05/06/2023   Chronic bronchitis (HCC) 04/28/2023   Dysphagia 04/28/2023   Gastroenteritis 03/02/2023   Spinal stenosis of lumbar region 03/02/2023   PVD (peripheral vascular disease) (HCC) 12/24/2021   Carotid stenosis 02/08/2018   AAA (abdominal aortic aneurysm) without rupture (HCC) 08/25/2016   Carotid disease, bilateral (HCC) 08/18/2016   Cardiomyopathy, ischemic 08/18/2016   CAD (coronary artery disease) 08/08/2016   Acute blood loss anemia 08/08/2016   Dyslipidemia 08/08/2016   Bipolar 1 disorder (HCC)    Essential hypertension    Anxiety    Crack cocaine use    Tobacco abuse    Right Groin Hematoma    Chest pain 08/07/2016   Unstable angina pectoris (HCC) 08/06/2016    PCP: Terry Wilhelmena Lloyd Hilario, FNP   REFERRING PROVIDER: Delight Mom   REFERRING DIAG: [phone] (ICD-10-CM) - Cerebral infarction due to embolism of right posterior cerebral artery   Rationale for Evaluation and Treatment:  Rehabiliation  THERAPY DIAG:  Muscle weakness (generalized)  Other abnormalities of gait and mobility  ONSET DATE: 03/21/24 CVA   SUBJECTIVE:  SUBJECTIVE STATEMENT: She admits to not being compliant with home exercises. She denies pain today but does complain about cramp in her right leg.   PERTINENT HISTORY:  History of Present Illness: [name] is a 64 y.o. female with a past medical history of essential hypertension, bilateral carotid artery disease (including left common carotid occlusion and prior right CEA), infrarenal abdominal aortic aneurysm (now measuring 5  cm), non-insulin -dependent type 2 diabetes, ongoing tobacco abuse, and prior substance abuse (quit approximately 5 years ago), cardiomyopathy with LV recovery, hyperlipidemia, and tobacco abuse who presented to the emergency department at [facility] on 03/24/2024 complaining of a 1 day history of sudden onset of visual field deficit. NIHSS was 1. Head CT at the outside hospital revealed right occipital ischemic changes. CTA head and neck showed a right mid posterior cerebral artery occlusion and LEFT ICA occlusion. She was then transferred to [facility] and admitted to the Neurology service for further management and stroke work up.   PAIN:  NPRS scale: 7/10 in general since stroke, reports increased headaches since stroke Pain description: constant, achy, sharp Aggravating factors: walking Relieving factors: rest,sitting   PRECAUTIONS: ,  None  RED FLAGS: None   WEIGHT BEARING RESTRICTIONS:  No  FALLS:  Has patient fallen in last 6 months? No   OCCUPATION:  None, disability  PLOF:  She can shower herself and use the bathroom herself but mostly Needs assistance with ADLs  PATIENT GOALS:  Walking more.   OBJECTIVE:  Note: Objective measures were completed at Evaluation unless otherwise noted.  DIAGNOSTIC FINDINGS:  MRI IMPRESSION:   1. Acute RIGHT PCA distribution infarct. No hemorrhage.  2. Occlusion of the LEFT ICA.  3. Old LEFT frontal infarct. Additional old lacunar infarcts.  4. Cerebral atrophy with moderate white matter disease reflecting gliosis secondary to chronic microvascular ischemia.   PATIENT SURVEYS:  Patient-Specific Activity Scoring Scheme  0 represents "unable to perform." 10 represents "able to perform at prior level. 0 1 2 3 4 5 6 7 8 9  10 (Date and Score)   Activity Eval     1. Sit to chair 7     2. Walking 5 minutes 2     3.     4.    5.    Score 4.5/10    Total score = sum of the activity  scores/number of activities Minimum detectable change (90%CI) for average score = 2 points Minimum detectable change (90%CI) for single activity score = 3 points   GAIT: Distance walked: limited to household distances due to pain and weakness Assistive device utilized: Environmental consultant - 4 wheeled Level of assistance: SBA   Visual changes: reports blurry vision since stroke  Sensation: WNL  Memory: reports issues since stroke   UPPER EXTREMITY ROM: WNL    UPPER EXTREMITY FFU:hmnddob 5/5 MMT bilat     LOWER EXTREMITY MMT:    MMT Right eval Left eval  Hip flexion 4 3  Hip extension    Hip abduction    Hip adduction    Hip internal rotation    Hip external rotation    Knee flexion 5 4  Knee extension 5 4  Ankle dorsiflexion    Ankle plantarflexion    Ankle inversion    Ankle eversion     (Blank rows = not tested)   FUNCTIONAL TESTS:  Eval: 30 second chair stand test: 6 reps 2 minute walk test: 111 feet, one seated rest break  needed and unable to continue until 2 min was up. Used RW and CGA needed                                                                                                                              TREATMENT DATE:  05/03/24 Therex: HEP review and education Nu step L3 X 15 min, (after HR 65 and SPO2 97%) Seated marches 2# X 15 bilat Seated LAQ with 2#X 15 bilat  Theractivity: Wheelchair to chair transfer, stand pivot transfer to and from with Supervison Ambulation with RW, 200 feet X 1 with supervision (HR75 and SPO2 97%) Sit to stand from wheelchair with UE support X 4 reps during session, supervision Sidestepping in // bars 10 feet X 6 with UE support  04/26/24 Therex: HEP review and education Nu step L3 X 8 min, one rest break needed Seated SLR 2X10 bilat Seated LAQ with red band 2X10  Neuro re-ed Gaze stabilization horizontal 30 sec X 2 Gaze stabilization vertical 30 sec X 2 Education, cuing and demo for proper technique for home,  and rationale  Theractivity: Wheelchair to chair transfer, stand pivot transfer to and from with Supervison Ambulation with RW, 115 feet X 2 with supervision seated rest break in between (HR85 and SPO2 98% after each round) Sit to stand from wheelchair with UE support X 4 reps during session, supervision     PATIENT EDUCATION: Education details: HEP, PT plan of care, selfcare Person educated: Patient Education method: Explanation, Demonstration, Verbal cues, and Handouts Education comprehension: verbalized understanding, further education recommended   HOME EXERCISE PROGRAM: Access Code: [email or web address] URL: [email or web address] Date: 04/11/2024 Prepared by: Redell Moose  Exercises - Seated Straight Leg Raise with Quad Contraction  - 2 x daily - 6 x weekly - 3 sets - 5 reps - Seated Knee Extension with Resistance  - 2 x daily - 6 x weekly - 3 sets - 10 reps - Single Leg Sit to Stand with Arm Support  - 2 x daily - 6 x weekly - 2 sets - 5 reps - alternating marching  - 2 x daily - 6 x weekly - 1 sets - 10 reps - Seated Gaze Stabilization with Head Rotation  - 2 x daily - 6 x weekly - 1 sets - 2 reps - 30 sec hold - Seated Gaze Stabilization with Head Nod and Vertical Arm Movement  - 2 x daily - 6 x weekly - 1 sets - 2 reps - 30 sec hold - Standing Romberg to 1/2 Tandem Stance  - 2 x daily - 6 x weekly - 1 sets - 3 reps - 15 sec hold  ASSESSMENT:  CLINICAL IMPRESSION: She has not been compliant with her home exercises but she does at least show good effort in PT. She was able to do more exercise today and walk double the distance in session today.   OBJECTIVE IMPAIRMENTS: decreased activity tolerance for ADL's, difficulty walking, decreased  balance, decreased endurance, decreased mobility, decreased ROM, decreased strength, impaired flexibility, and pain.  ACTIVITY LIMITATIONS: bending, lifting, carry, locomotion, standing, walking, vision   PERSONAL FACTORS: see above  PMH  also affecting patient's functional outcome.  REHAB POTENTIAL: Fair , limited by lack of motivation to attend PT  CLINICAL DECISION MAKING: Evolving/moderate complexity  EVALUATION COMPLEXITY: Moderate    GOALS: Short term PT Goals Target date: 05/09/2024   Pt will be I and compliant with HEP. Baseline:  Goal status: ongoing 05/02/24  Long term PT goals Target date:05/23/2024 Pt will improve leg strength to at least 4+/5 MMT to improve functional strength Baseline: Goal status: ongoing 05/02/24 Pt will improve Patient specific functional scale (PSFS) to at least 6/10 to show improved function level Baseline: Goal status: ongoing 05/02/24 Pt will improve 30 second chair stand test to 9 or more reps Baseline:6 Goal status: ongoing 05/02/24 Pt will be able to ambulate at least 150 feet on 2 minute walk test. Baseline:111 Goal status: ongoing 05/02/24  PLAN: PT FREQUENCY: 1-3 times per week   PT DURATION: 6 weeks  PLANNED INTERVENTIONS (unless contraindicated):  97110-Therapeutic exercises, 97530- Therapeutic activity, 97112- Neuromuscular re-education, 97535- Self Care, 02859- Manual therapy, 97116- Gait training, Balance training, Vestibular training, and Visual/preceptual remediation/compensation  PLAN FOR NEXT SESSION:  work on leg strength, gait, balance, gaze stabilization   Redell JONELLE Moose, PT,DPT 05/03/2024, 2:47 PM

## 2024-05-05 ENCOUNTER — Encounter: Payer: Self-pay | Admitting: Family Medicine

## 2024-05-05 ENCOUNTER — Ambulatory Visit: Payer: Self-pay | Admitting: Family Medicine

## 2024-05-05 VITALS — BP 123/86 | HR 79 | Resp 16 | Ht 63.0 in | Wt 212.1 lb

## 2024-05-05 DIAGNOSIS — I10 Essential (primary) hypertension: Secondary | ICD-10-CM

## 2024-05-05 DIAGNOSIS — I779 Disorder of arteries and arterioles, unspecified: Secondary | ICD-10-CM

## 2024-05-05 DIAGNOSIS — E785 Hyperlipidemia, unspecified: Secondary | ICD-10-CM | POA: Diagnosis not present

## 2024-05-05 DIAGNOSIS — F17210 Nicotine dependence, cigarettes, uncomplicated: Secondary | ICD-10-CM | POA: Diagnosis not present

## 2024-05-05 DIAGNOSIS — I63431 Cerebral infarction due to embolism of right posterior cerebral artery: Secondary | ICD-10-CM

## 2024-05-05 DIAGNOSIS — N3001 Acute cystitis with hematuria: Secondary | ICD-10-CM

## 2024-05-05 DIAGNOSIS — Z09 Encounter for follow-up examination after completed treatment for conditions other than malignant neoplasm: Secondary | ICD-10-CM | POA: Diagnosis not present

## 2024-05-05 MED ORDER — BUSPIRONE HCL 5 MG PO TABS: 5.0000 mg | ORAL_TABLET | Freq: Three times a day (TID) | ORAL | 0 refills | Status: AC | PRN

## 2024-05-05 NOTE — Assessment & Plan Note (Signed)
 Reports residual visual loss in left eye, needs opthalmology eval , info provided for office , [facility], which she can go to as a walk in pt and which accepts her insurance

## 2024-05-05 NOTE — Assessment & Plan Note (Signed)
 Currently on lipitor  80 mg will need rept labs in next 6 to 8 weeks

## 2024-05-05 NOTE — Patient Instructions (Addendum)
 F/U with PCP as before  Congrat so n quitting smoking keep it up  [facility] is a walk in at [employer] , takes your insurance Thanks for choosing [facility], we consider it a privelige to serve you.

## 2024-05-05 NOTE — Assessment & Plan Note (Signed)
 Controlled, no change in medication

## 2024-05-05 NOTE — Assessment & Plan Note (Signed)
 Asymptomatic, treated while hospitalized

## 2024-05-05 NOTE — Progress Notes (Signed)
   [name]     MRN: [id number]      DOB: [birth year]  Chief Complaint  Patient presents with   Follow-up    Following up from being in hospital on 06/27 for CVA. Still having dizziness, when standing. Is wanting home health aide with daily living. Also wanting script for bp monitor and cuff    HPI [name] is here for follow up of hospitalization from 6/27 to 03/26/2024 with dx of acute CVA, UTI, CAPcontinied vision loss in left eye and needs Ophthalmology fo.w up Recently saw Neurology with ho additional f/u on schedule Has upcoming appt with Cardiology, stroke felt to be due toa fib with rVR and eliquis isnew fr her, just took off a zio patch and has appt in 2 weeks Will get nicoderm patches in the mail, has not smoked since the stroke Vascular f/u for her AAA and carotid occlusion will remain as before Reports  ROS Denies recent fever or chills. Denies sinus pressure, nasal congestion, ear pain or sore throat. Denies chest congestion, productive cough or wheezing. Denies chest pains, palpitations and leg swelling Denies abdominal pain, nausea, vomiting,diarrhea or constipation.   Chronic joint pain and  limitation in mobility Denies headaches, seizures, numbness, or tingling. C/o  depression and anxiety , not suicidal or homicidal Denies skin break down or rash.   PE  BP 123/86   Pulse 79   Resp 16   Ht 5' 3 (1.6 m)   Wt 212 lb 1.3 oz (96.2 kg)   LMP 07/18/2011   SpO2 93%   BMI 37.57 kg/m   Patient alert and oriented and in no cardiopulmonary distress.  HEENT: No facial asymmetry, EOMI,     Neck decreased ROM .  Chest: Clear to auscultation bilaterally.decreased air entry  CVS: S1, S2 no murmurs, no S3.Iregular rate.  ABD: Soft non tender.   Ext: No edema  FD:izrmzjdzi ROM spine, shoulders, hips and knees.  Skin: Intact, no ulcerations or rash noted.  Psych: Good eye contact, normal affect. Memory intact mildly  anxious not  depressed  appearing.  CNS: CN 2-12 intact, power,  normal throughout.no focal deficits noted.   Assessment & Plan  Cerebrovascular accident (CVA) due to embolism of right posterior cerebral artery (HCC) Reports residual visual loss in left eye, needs opthalmology eval , info provided for office , [facility], which she can go to as a walk in pt and which accepts her insurance  Acute cystitis with hematuria  Asymptomatic, treated while hospitalized   Hyperlipidemia Currently on lipitor  80 mg will need rept labs in next 6 to 8 weeks  Cigarette smoker Quit 6/26, has reached out to quit line and will receive limited number of patches in the mail  Essential hypertension Controlled, no change in medication   Carotid disease, bilateral (HCC) Keep scheduled f/u in 08/2024  Hospital discharge follow-up Patient in for follow up of recent hospitalization. Discharge summary, and laboratory and radiology data are reviewed, and any questions or concerns  are discussed. Specific issues requiring follow up are specifically addressed.

## 2024-05-05 NOTE — Assessment & Plan Note (Signed)
 Keep scheduled f/u in 08/2024

## 2024-05-05 NOTE — Assessment & Plan Note (Signed)
Patient in for follow up of recent hospitalization. Discharge summary, and laboratory and radiology data are reviewed, and any questions or concerns  are discussed. Specific issues requiring follow up are specifically addressed.  

## 2024-05-05 NOTE — Assessment & Plan Note (Signed)
 Quit 6/26, has reached out to quit line and will receive limited number of patches in the mail

## 2024-05-09 ENCOUNTER — Ambulatory Visit: Admitting: Physical Therapy

## 2024-05-11 ENCOUNTER — Ambulatory Visit: Admitting: Physical Therapy

## 2024-05-11 ENCOUNTER — Encounter: Payer: Self-pay | Admitting: Physical Therapy

## 2024-05-11 ENCOUNTER — Telehealth: Payer: Self-pay | Admitting: Family Medicine

## 2024-05-11 DIAGNOSIS — M6281 Muscle weakness (generalized): Secondary | ICD-10-CM | POA: Diagnosis not present

## 2024-05-11 DIAGNOSIS — R2689 Other abnormalities of gait and mobility: Secondary | ICD-10-CM

## 2024-05-11 NOTE — Therapy (Signed)
 OUTPATIENT PHYSICAL THERAPY TREATMENT   Patient Name: RHANDI DESPAIN MRN: [id number] DOB:14-May-1961, 63 y.o., female Today's Date: 05/11/2024  END OF SESSION:  PT End of Session - 05/11/24 1525     Visit Number 4    Number of Visits 12    Date for PT Re-Evaluation 05/23/24    Authorization Type MCR MCD    PT Start Time 1510    PT Stop Time 1548    PT Time Calculation (min) 38 min    Activity Tolerance Patient tolerated treatment well    Behavior During Therapy [facility] for tasks assessed/performed            Past Medical History:  Diagnosis Date   AAA (abdominal aortic aneurysm) without rupture (HCC) 08/25/2016   Anxiety    Arthritis    Bipolar 1 disorder (HCC)    CAD (coronary artery disease)    a. 08/08/16 LAD 70%, 60% 1st diag, CTO RCA Med Rx EF 55%   Carotid artery stenosis    Right carotid endarterectomy 07/22/05 with recurrent stenosis 01/2017; known left ICA occlusion   COPD (chronic obstructive pulmonary disease) (HCC)    Crack cocaine use    Depression    Headache    Hypertension    Myocardial infarction [facility])    Right Groin Hematoma    a. 07/2016 following diagnostic cath   Stroke [facility]) 2017   Past Surgical History:  Procedure Laterality Date   CARDIAC CATHETERIZATION N/A 08/07/2016   Procedure: Left Heart Cath and Coronary Angiography;  Surgeon: Debby DELENA Sor, MD;  [facility];  Service: Cardiovascular;  Laterality: N/A;   CYST REMOVAL TRUNK Left    beneath L breast   ENDARTERECTOMY Right 02/08/2018   Procedure: ENDARTERECTOMY CAROTID REDO;  Surgeon: Eliza Lonni RAMAN, MD;  Location: [facility] OR;  Service: Vascular;  Laterality: Right;   TUBAL LIGATION     VASCULAR SURGERY Right    right carotid endarterectomy 07/22/05    Patient Active Problem List   Diagnosis Date Noted   Hospital discharge follow-up 05/05/2024   Acute cystitis with hematuria 03/26/2024   Cerebrovascular accident (CVA) due to embolism of right posterior cerebral  artery (HCC) 03/24/2024   Acute ischemic right PCA stroke (HCC) 03/23/2024   Prediabetes 07/30/2023   Hyperlipidemia 07/30/2023   Cigarette smoker 05/25/2023   AKI (acute kidney injury) (HCC) 05/23/2023   CAP (community acquired pneumonia) 05/23/2023   Leukocytosis 05/23/2023   Acute respiratory failure with hypoxia and hypercarbia (HCC) 05/23/2023   Odynophagia 05/18/2023   GERD (gastroesophageal reflux disease) 05/18/2023   History of COPD 05/06/2023   Chronic bronchitis (HCC) 04/28/2023   Dysphagia 04/28/2023   Gastroenteritis 03/02/2023   Spinal stenosis of lumbar region 03/02/2023   PVD (peripheral vascular disease) (HCC) 12/24/2021   Carotid stenosis 02/08/2018   AAA (abdominal aortic aneurysm) without rupture (HCC) 08/25/2016   Carotid disease, bilateral (HCC) 08/18/2016   Cardiomyopathy, ischemic 08/18/2016   CAD (coronary artery disease) 08/08/2016   Acute blood loss anemia 08/08/2016   Dyslipidemia 08/08/2016   Bipolar 1 disorder (HCC)    Essential hypertension    Anxiety    Crack cocaine use    Tobacco abuse    Right Groin Hematoma    Chest pain 08/07/2016   Unstable angina pectoris (HCC) 08/06/2016    PCP: Terry Wilhelmena Lloyd Hilario, FNP   REFERRING PROVIDER: Delight Mom   REFERRING DIAG: [id number] (ICD-10-CM) - Cerebral infarction due to embolism of right posterior cerebral artery  Rationale for Evaluation and Treatment:  Rehabiliation  THERAPY DIAG:  Muscle weakness (generalized)  Other abnormalities of gait and mobility  ONSET DATE: 03/21/24 CVA   SUBJECTIVE:                                                                                                                                                                                           SUBJECTIVE STATEMENT: She feels very tired today, denies pain  PERTINENT HISTORY:  History of Present Illness: Raynesha Tiedt is a 63 y.o. female with a past medical history of essential hypertension,  bilateral carotid artery disease (including left common carotid occlusion and prior right CEA), infrarenal abdominal aortic aneurysm (now measuring 5 cm), non-insulin -dependent type 2 diabetes, ongoing tobacco abuse, and prior substance abuse (quit approximately 5 years ago), cardiomyopathy with LV recovery, hyperlipidemia, and tobacco abuse who presented to the emergency department at [facility] on 03/24/2024 complaining of a 1 day history of sudden onset of visual field deficit. NIHSS was 1. Head CT at the outside hospital revealed right occipital ischemic changes. CTA head and neck showed a right mid posterior cerebral artery occlusion and LEFT ICA occlusion. She was then transferred to [facility]), and admitted to the Neurology service for further management and stroke work up.   PAIN:  NPRS scale: denies Pain description: constant, achy, sharp Aggravating factors: walking Relieving factors: rest,sitting   PRECAUTIONS: ,  None  RED FLAGS: None   WEIGHT BEARING RESTRICTIONS:  No  FALLS:  Has patient fallen in last 6 months? No   OCCUPATION:  None, disability  PLOF:  She can shower herself and use the bathroom herself but mostly Needs assistance with ADLs  PATIENT GOALS:  Walking more.   OBJECTIVE:  Note: Objective measures were completed at Evaluation unless otherwise noted.  DIAGNOSTIC FINDINGS:  MRI IMPRESSION:   1. Acute RIGHT PCA distribution infarct. No hemorrhage.  2. Occlusion of the LEFT ICA.  3. Old LEFT frontal infarct. Additional old lacunar infarcts.  4. Cerebral atrophy with moderate white matter disease reflecting gliosis secondary to chronic microvascular ischemia.   PATIENT SURVEYS:  Patient-Specific Activity Scoring Scheme  0 represents "unable to perform." 10 represents "able to perform at prior level. 0 1 2 3 4 5 6 7 8 9  10 (Date and Score)   Activity Eval     1. Sit to chair 7     2. Walking 5 minutes 2      3.     4.    5.    Score 4.5/10    Total score = sum of the  activity scores/number of activities Minimum detectable change (90%CI) for average score = 2 points Minimum detectable change (90%CI) for single activity score = 3 points   GAIT: Distance walked: limited to household distances due to pain and weakness Assistive device utilized: Environmental consultant - 4 wheeled Level of assistance: SBA   Visual changes: reports blurry vision since stroke  Sensation: WNL  Memory: reports issues since stroke   UPPER EXTREMITY ROM: WNL    UPPER EXTREMITY FFU:hmnddob 5/5 MMT bilat     LOWER EXTREMITY MMT:    MMT Right eval Left eval  Hip flexion 4 3  Hip extension    Hip abduction    Hip adduction    Hip internal rotation    Hip external rotation    Knee flexion 5 4  Knee extension 5 4  Ankle dorsiflexion    Ankle plantarflexion    Ankle inversion    Ankle eversion     (Blank rows = not tested)   FUNCTIONAL TESTS:  Eval: 30 second chair stand test: 6 reps 2 minute walk test: 111 feet, one seated rest break needed and unable to continue until 2 min was up. Used RW and CGA needed                                                                                                                              TREATMENT DATE:  05/11/24 Therex: Nu step L3 X 15 min, (after HR 65 and SPO2 97%) Seated marches 2# X 15 bilat Seated LAQ with 2#X 15 bilat  Theractivity: Wheelchair to chair transfer, stand pivot transfer to and from with Supervison Sit to stand from wheelchair with UE support X 4 reps during session, supervision Sidestepping in // bars 10 feet X 6 with UE support Forward walking in // bars 10 feet X 3 with backwards walking 10 feet X 3 with UE support  05/03/24 Therex: HEP review and education Nu step L3 X 15 min, (after HR 65 and SPO2 97%) Seated marches 2# X 15 bilat Seated LAQ with 2#X 15 bilat  Theractivity: Wheelchair to chair transfer, stand pivot transfer to  and from with Supervison Ambulation with RW, 200 feet X 1 with supervision (HR75 and SPO2 97%) Sit to stand from wheelchair with UE support X 4 reps during session, supervision Sidestepping in // bars 10 feet X 6 with UE support  04/26/24 Therex: HEP review and education Nu step L3 X 8 min, one rest break needed Seated SLR 2X10 bilat Seated LAQ with red band 2X10  Neuro re-ed Gaze stabilization horizontal 30 sec X 2 Gaze stabilization vertical 30 sec X 2 Education, cuing and demo for proper technique for home, and rationale  Theractivity: Wheelchair to chair transfer, stand pivot transfer to and from with Supervison Ambulation with RW, 115 feet X 2 with supervision seated rest break in between (HR85 and SPO2 98% after each round) Sit to stand from wheelchair with UE support X  4 reps during session, supervision     PATIENT EDUCATION: Education details: HEP, PT plan of care, selfcare Person educated: Patient Education method: Explanation, Demonstration, Verbal cues, and Handouts Education comprehension: verbalized understanding, further education recommended   HOME EXERCISE PROGRAM: Access Code: [email or web address] URL: [email or web address] Date: 04/11/2024 Prepared by: Redell Moose  Exercises - Seated Straight Leg Raise with Quad Contraction  - 2 x daily - 6 x weekly - 3 sets - 5 reps - Seated Knee Extension with Resistance  - 2 x daily - 6 x weekly - 3 sets - 10 reps - Single Leg Sit to Stand with Arm Support  - 2 x daily - 6 x weekly - 2 sets - 5 reps - alternating marching  - 2 x daily - 6 x weekly - 1 sets - 10 reps - Seated Gaze Stabilization with Head Rotation  - 2 x daily - 6 x weekly - 1 sets - 2 reps - 30 sec hold - Seated Gaze Stabilization with Head Nod and Vertical Arm Movement  - 2 x daily - 6 x weekly - 1 sets - 2 reps - 30 sec hold - Standing Romberg to 1/2 Tandem Stance  - 2 x daily - 6 x weekly - 1 sets - 3 reps - 15 sec hold  ASSESSMENT:  CLINICAL  IMPRESSION: Walking remains difficult for her. PT will continue to work to try to improve this as tolerated. She will see optometrist about her visual deficits as well.   OBJECTIVE IMPAIRMENTS: decreased activity tolerance for ADL's, difficulty walking, decreased balance, decreased endurance, decreased mobility, decreased ROM, decreased strength, impaired flexibility, and pain.  ACTIVITY LIMITATIONS: bending, lifting, carry, locomotion, standing, walking, vision   PERSONAL FACTORS: see above PMH  also affecting patient's functional outcome.  REHAB POTENTIAL: Fair , limited by lack of motivation to attend PT  CLINICAL DECISION MAKING: Evolving/moderate complexity  EVALUATION COMPLEXITY: Moderate    GOALS: Short term PT Goals Target date: 05/09/2024   Pt will be I and compliant with HEP. Baseline:  Goal status: ongoing 05/02/24  Long term PT goals Target date:05/23/2024 Pt will improve leg strength to at least 4+/5 MMT to improve functional strength Baseline: Goal status: ongoing 05/02/24 Pt will improve Patient specific functional scale (PSFS) to at least 6/10 to show improved function level Baseline: Goal status: ongoing 05/02/24 Pt will improve 30 second chair stand test to 9 or more reps Baseline:6 Goal status: ongoing 05/02/24 Pt will be able to ambulate at least 150 feet on 2 minute walk test. Baseline:111 Goal status: ongoing 05/02/24  PLAN: PT FREQUENCY: 1-3 times per week   PT DURATION: 6 weeks  PLANNED INTERVENTIONS (unless contraindicated):  97110-Therapeutic exercises, 97530- Therapeutic activity, 97112- Neuromuscular re-education, 97535- Self Care, 02859- Manual therapy, 97116- Gait training, Balance training, Vestibular training, and Visual/preceptual remediation/compensation  PLAN FOR NEXT SESSION:  work on leg strength, gait, balance, gaze stabilization   Redell JONELLE Moose, PT,DPT 05/11/2024, 3:59 PM

## 2024-05-11 NOTE — Telephone Encounter (Signed)
Form faxed and copy sent to scan

## 2024-05-16 ENCOUNTER — Encounter: Payer: Self-pay | Admitting: Physical Therapy

## 2024-05-16 ENCOUNTER — Ambulatory Visit: Admitting: Physical Therapy

## 2024-05-16 ENCOUNTER — Encounter: Admitting: Physical Therapy

## 2024-05-16 DIAGNOSIS — M6281 Muscle weakness (generalized): Secondary | ICD-10-CM | POA: Diagnosis not present

## 2024-05-16 DIAGNOSIS — R2689 Other abnormalities of gait and mobility: Secondary | ICD-10-CM

## 2024-05-16 NOTE — Therapy (Signed)
 OUTPATIENT PHYSICAL THERAPY TREATMENT   Patient Name: [name] MRN: [id number] DOB:01-Mar-1961, 63 y.o., female Today's Date: 05/16/2024  END OF SESSION:  PT End of Session - 05/16/24 1117     Visit Number 5    Number of Visits 12    Date for PT Re-Evaluation 05/23/24    Authorization Type MCR MCD    PT Start Time 1035    PT Stop Time 1115    PT Time Calculation (min) 40 min    Activity Tolerance Patient tolerated treatment well    Behavior During Therapy [facility] for tasks assessed/performed             Past Medical History:  Diagnosis Date   AAA (abdominal aortic aneurysm) without rupture (HCC) 08/25/2016   Anxiety    Arthritis    Bipolar 1 disorder (HCC)    CAD (coronary artery disease)    a. 08/08/16 LAD 70%, 60% 1st diag, CTO RCA Med Rx EF 55%   Carotid artery stenosis    Right carotid endarterectomy 07/22/05 with recurrent stenosis 01/2017; known left ICA occlusion   COPD (chronic obstructive pulmonary disease) (HCC)    Crack cocaine use    Depression    Headache    Hypertension    Myocardial infarction [facility])    Right Groin Hematoma    a. 07/2016 following diagnostic cath   Stroke [facility]) 2017   Past Surgical History:  Procedure Laterality Date   CARDIAC CATHETERIZATION N/A 08/07/2016   Procedure: Left Heart Cath and Coronary Angiography;  Surgeon: Debby DELENA Sor, MD;  Location: [facility] INVASIVE CV LAB;  Service: Cardiovascular;  Laterality: N/A;   CYST REMOVAL TRUNK Left    beneath L breast   ENDARTERECTOMY Right 02/08/2018   Procedure: ENDARTERECTOMY CAROTID REDO;  Surgeon: Eliza Lonni RAMAN, MD;  Location: [facility] OR;  Service: Vascular;  Laterality: Right;   TUBAL LIGATION     VASCULAR SURGERY Right    right carotid endarterectomy 07/22/05    Patient Active Problem List   Diagnosis Date Noted   Hospital discharge follow-up 05/05/2024   Acute cystitis with hematuria 03/26/2024   Cerebrovascular accident (CVA) due to embolism of right posterior cerebral  artery (HCC) 03/24/2024   Acute ischemic right PCA stroke (HCC) 03/23/2024   Prediabetes 07/30/2023   Hyperlipidemia 07/30/2023   Cigarette smoker 05/25/2023   AKI (acute kidney injury) (HCC) 05/23/2023   CAP (community acquired pneumonia) 05/23/2023   Leukocytosis 05/23/2023   Acute respiratory failure with hypoxia and hypercarbia (HCC) 05/23/2023   Odynophagia 05/18/2023   GERD (gastroesophageal reflux disease) 05/18/2023   History of COPD 05/06/2023   Chronic bronchitis (HCC) 04/28/2023   Dysphagia 04/28/2023   Gastroenteritis 03/02/2023   Spinal stenosis of lumbar region 03/02/2023   PVD (peripheral vascular disease) (HCC) 12/24/2021   Carotid stenosis 02/08/2018   AAA (abdominal aortic aneurysm) without rupture (HCC) 08/25/2016   Carotid disease, bilateral (HCC) 08/18/2016   Cardiomyopathy, ischemic 08/18/2016   CAD (coronary artery disease) 08/08/2016   Acute blood loss anemia 08/08/2016   Dyslipidemia 08/08/2016   Bipolar 1 disorder (HCC)    Essential hypertension    Anxiety    Crack cocaine use    Tobacco abuse    Right Groin Hematoma    Chest pain 08/07/2016   Unstable angina pectoris (HCC) 08/06/2016    PCP: Terry Wilhelmena Lloyd Hilario, FNP   REFERRING PROVIDER: Delight Mom   REFERRING DIAG: [phone] (ICD-10-CM) - Cerebral infarction due to embolism of right posterior cerebral artery  Rationale for Evaluation and Treatment:  Rehabiliation  THERAPY DIAG:  Muscle weakness (generalized)  Other abnormalities of gait and mobility  ONSET DATE: 03/21/24 CVA   SUBJECTIVE:                                                                                                                                                                                           SUBJECTIVE STATEMENT: She relays 5/10 pain today, kind of all over  PERTINENT HISTORY:  History of Present Illness: [name] is a 63 y.o. female with a past medical history of essential hypertension,  bilateral carotid artery disease (including left common carotid occlusion and prior right CEA), infrarenal abdominal aortic aneurysm (now measuring 5 cm), non-insulin -dependent type 2 diabetes, ongoing tobacco abuse, and prior substance abuse (quit approximately 5 years ago), cardiomyopathy with LV recovery, hyperlipidemia, and tobacco abuse who presented to the emergency department at [facility] on 03/24/2024 complaining of a 1 day history of sudden onset of visual field deficit. NIHSS was 1. Head CT at the outside hospital revealed right occipital ischemic changes. CTA head and neck showed a right mid posterior cerebral artery occlusion and LEFT ICA occlusion. She was then transferred to [facility]), and admitted to the Neurology service for further management and stroke work up.   PAIN: NPRS scale: 5/10 Pain description: constant, achy, sharp Aggravating factors: walking Relieving factors: rest,sitting   PRECAUTIONS: ,  None  RED FLAGS: None   WEIGHT BEARING RESTRICTIONS:  No  FALLS:  Has patient fallen in last 6 months? No   OCCUPATION:  None, disability  PLOF:  She can shower herself and use the bathroom herself but mostly Needs assistance with ADLs  PATIENT GOALS:  Walking more.   OBJECTIVE:  Note: Objective measures were completed at Evaluation unless otherwise noted.  DIAGNOSTIC FINDINGS:  MRI IMPRESSION:   1. Acute RIGHT PCA distribution infarct. No hemorrhage.  2. Occlusion of the LEFT ICA.  3. Old LEFT frontal infarct. Additional old lacunar infarcts.  4. Cerebral atrophy with moderate white matter disease reflecting gliosis secondary to chronic microvascular ischemia.   PATIENT SURVEYS:  Patient-Specific Activity Scoring Scheme  0 represents "unable to perform." 10 represents "able to perform at prior level. 0 1 2 3 4 5 6 7 8 9  10 (Date and Score)   Activity Eval     1. Sit to chair 7     2. Walking 5 minutes 2      3.     4.    5.    Score 4.5/10    Total score = sum of  the activity scores/number of activities Minimum detectable change (90%CI) for average score = 2 points Minimum detectable change (90%CI) for single activity score = 3 points   GAIT: Distance walked: limited to household distances due to pain and weakness Assistive device utilized: Environmental consultant - 4 wheeled Level of assistance: SBA   Visual changes: reports blurry vision since stroke  Sensation: WNL  Memory: reports issues since stroke   UPPER EXTREMITY ROM: WNL    UPPER EXTREMITY FFU:hmnddob 5/5 MMT bilat     LOWER EXTREMITY MMT:    MMT Right eval Left eval Left 05/16/24  Hip flexion 4 3 4   Hip extension     Hip abduction     Hip adduction     Hip internal rotation     Hip external rotation     Knee flexion 5 4   Knee extension 5 4   Ankle dorsiflexion     Ankle plantarflexion     Ankle inversion     Ankle eversion      (Blank rows = not tested)   FUNCTIONAL TESTS:  Eval: 30 second chair stand test: 6 reps 2 minute walk test: 111 feet, one seated rest break needed and unable to continue until 2 min was up. Used RW and CGA needed                                                                                                                              TREATMENT DATE:  05/16/24 Therex: Nu step L3 X 20 min for endurance and leg strength Seated marches 3#  2 X 10 bilat Seated LAQ with 3# 2 X 15 bilat  Theractivity: Wheelchair to chair transfer, stand pivot transfer to and from, mod I now Sit to stand from wheelchair with UE support X 4 reps during session, supervision Ambulation with RW and supervison 100 feet  05/11/24 Therex: Nu step L3 X 15 min, (after HR 65 and SPO2 97%) Seated marches 2# X 15 bilat Seated LAQ with 2#X 15 bilat   Theractivity: Wheelchair to chair transfer, stand pivot transfer to and from with Supervison Sit to stand from wheelchair with UE support X 4 reps during session,  supervision Sidestepping in // bars 10 feet X 6 with UE support Forward walking in // bars 10 feet X 3 with backwards walking 10 feet X 3 with UE support        PATIENT EDUCATION: Education details: HEP, PT plan of care, selfcare Person educated: Patient Education method: Explanation, Demonstration, Verbal cues, and Handouts Education comprehension: verbalized understanding, further education recommended   HOME EXERCISE PROGRAM: Access Code: [email or web address] URL: [email or web address] Date: 04/11/2024 Prepared by: Redell Moose  Exercises - Seated Straight Leg Raise with Quad Contraction  - 2 x daily - 6 x weekly - 3 sets - 5 reps - Seated Knee Extension with Resistance  - 2 x daily - 6 x weekly - 3 sets - 10 reps - Single Leg  Sit to Stand with Arm Support  - 2 x daily - 6 x weekly - 2 sets - 5 reps - alternating marching  - 2 x daily - 6 x weekly - 1 sets - 10 reps - Seated Gaze Stabilization with Head Rotation  - 2 x daily - 6 x weekly - 1 sets - 2 reps - 30 sec hold - Seated Gaze Stabilization with Head Nod and Vertical Arm Movement  - 2 x daily - 6 x weekly - 1 sets - 2 reps - 30 sec hold - Standing Romberg to 1/2 Tandem Stance  - 2 x daily - 6 x weekly - 1 sets - 3 reps - 15 sec hold  ASSESSMENT:  CLINICAL IMPRESSION: Her abilities for sit to stand as well as stand pivot transfers have improved to modified independent now. She has improved leg strength as well. Ambulation remains supervision level and she requires walker for assistance with this and she is limited to about 100 feet at a time. Skilled PT remains recommended to improve her functional abilities.   OBJECTIVE IMPAIRMENTS: decreased activity tolerance for ADL's, difficulty walking, decreased balance, decreased endurance, decreased mobility, decreased ROM, decreased strength, impaired flexibility, and pain.  ACTIVITY LIMITATIONS: bending, lifting, carry, locomotion, standing, walking, vision   PERSONAL  FACTORS: see above PMH  also affecting patient's functional outcome.  REHAB POTENTIAL: Fair , limited by lack of motivation to attend PT  CLINICAL DECISION MAKING: Evolving/moderate complexity  EVALUATION COMPLEXITY: Moderate    GOALS: Short term PT Goals Target date: 05/09/2024   Pt will be I and compliant with HEP. Baseline:  Goal status: ongoing 05/16/24  Long term PT goals Target date:05/23/2024 Pt will improve leg strength to at least 4+/5 MMT to improve functional strength Baseline: Goal status: ongoing 05/16/24 Pt will improve Patient specific functional scale (PSFS) to at least 6/10 to show improved function level Baseline: Goal status: ongoing 05/16/24 Pt will improve 30 second chair stand test to 9 or more reps Baseline:6 Goal status: ongoing 05/16/24 Pt will be able to ambulate at least 150 feet on 2 minute walk test. Baseline:111 Goal status: ongoing 05/16/24  PLAN: PT FREQUENCY: 1-3 times per week   PT DURATION: 6 weeks  PLANNED INTERVENTIONS (unless contraindicated):  97110-Therapeutic exercises, 97530- Therapeutic activity, 97112- Neuromuscular re-education, 97535- Self Care, 02859- Manual therapy, 97116- Gait training, Balance training, Vestibular training, and Visual/preceptual remediation/compensation  PLAN FOR NEXT SESSION:  work on leg strength, gait, balance, endurance   Redell JONELLE Moose, PT,DPT 05/16/2024, 11:19 AM

## 2024-05-17 ENCOUNTER — Ambulatory Visit: Admitting: Family Medicine

## 2024-05-18 ENCOUNTER — Encounter: Admitting: Physical Therapy

## 2024-05-18 ENCOUNTER — Ambulatory Visit: Admitting: Physical Therapy

## 2024-05-19 ENCOUNTER — Encounter: Payer: Self-pay | Admitting: Family Medicine

## 2024-05-19 ENCOUNTER — Ambulatory Visit (INDEPENDENT_AMBULATORY_CARE_PROVIDER_SITE_OTHER): Admitting: Family Medicine

## 2024-05-19 ENCOUNTER — Telehealth: Payer: Self-pay | Admitting: Pharmacy Technician

## 2024-05-19 ENCOUNTER — Ambulatory Visit: Admitting: Family Medicine

## 2024-05-19 ENCOUNTER — Other Ambulatory Visit (HOSPITAL_COMMUNITY): Payer: Self-pay

## 2024-05-19 VITALS — BP 123/87 | HR 85 | Resp 16 | Ht 63.0 in | Wt 217.0 lb

## 2024-05-19 DIAGNOSIS — G629 Polyneuropathy, unspecified: Secondary | ICD-10-CM | POA: Diagnosis not present

## 2024-05-19 DIAGNOSIS — R7303 Prediabetes: Secondary | ICD-10-CM

## 2024-05-19 DIAGNOSIS — E559 Vitamin D deficiency, unspecified: Secondary | ICD-10-CM

## 2024-05-19 DIAGNOSIS — Z1211 Encounter for screening for malignant neoplasm of colon: Secondary | ICD-10-CM

## 2024-05-19 DIAGNOSIS — I10 Essential (primary) hypertension: Secondary | ICD-10-CM

## 2024-05-19 DIAGNOSIS — E782 Mixed hyperlipidemia: Secondary | ICD-10-CM | POA: Diagnosis not present

## 2024-05-19 DIAGNOSIS — E538 Deficiency of other specified B group vitamins: Secondary | ICD-10-CM | POA: Diagnosis not present

## 2024-05-19 MED ORDER — METOPROLOL TARTRATE 100 MG PO TABS: 200.0000 mg | ORAL_TABLET | Freq: Two times a day (BID) | ORAL | 1 refills | Status: DC

## 2024-05-19 MED ORDER — LIDOCAINE 5 % EX PTCH
3.0000 | MEDICATED_PATCH | CUTANEOUS | 2 refills | Status: AC
Start: 2024-05-19 — End: ?

## 2024-05-19 MED ORDER — AMLODIPINE BESYLATE 10 MG PO TABS: 10.0000 mg | ORAL_TABLET | Freq: Every day | ORAL | 1 refills | Status: AC

## 2024-05-19 NOTE — Patient Instructions (Signed)

## 2024-05-19 NOTE — Telephone Encounter (Signed)
 Pharmacy Patient Advocate Encounter  Received notification from [employer] that Prior Authorization for Lidocaine  5% patches has been APPROVED from 05/19/2024 to 09/27/2024. Ran test claim, Copay is $1.60. This test claim was processed through [facility]- copay amounts may vary at other pharmacies due to pharmacy/plan contracts, or as the patient moves through the different stages of their insurance plan.   PA #/Case ID/Reference #: [id number]

## 2024-05-19 NOTE — Assessment & Plan Note (Signed)
 Currently on lipitor  80 mg once daily Previous LDL 108 Repeat Lipid panel done today Discussed Limit: Saturated fats: Butter, cream, fatty meats. Trans fats: Fried foods, processed snacks. Sugar and refined carbs: Sweets, white bread. Focus on whole foods, healthy fats, and fiber to improve heart health! Maintain an exercise routine 3 to 5 days a week for a minimum total of 150 minutes.

## 2024-05-19 NOTE — Assessment & Plan Note (Signed)
 Controlled, continue Metoprolol  100 mg 2 times daily, amlodipine  10 mg once daily Labs ordered Continued discussion on DASH diet, low sodium diet and maintain a exercise routine for 150 minutes per week.

## 2024-05-19 NOTE — Progress Notes (Signed)
 Established Patient Office Visit   Subjective  Patient ID: [name], female    DOB: August 07, 1961  Age: 63 y.o. MRN: [id number]  No chief complaint on file.   She  has a past medical history of AAA (abdominal aortic aneurysm) without rupture (HCC) (08/25/2016), Anxiety, Arthritis, Bipolar 1 disorder (HCC), CAD (coronary artery disease), Carotid artery stenosis, COPD (chronic obstructive pulmonary disease) (HCC), Crack cocaine use, Depression, Headache, Hypertension, Myocardial infarction [facility]), Right Groin Hematoma, and Stroke (HCC) (2017).  HPI Patient presents to the clinic for chronic follow up. For the details of today's visit, please refer to assessment and plan.   Review of Systems  Constitutional:  Negative for chills and fever.  Eyes:  Positive for blurred vision.  Respiratory:  Negative for shortness of breath.   Cardiovascular:  Negative for chest pain.  Gastrointestinal:  Negative for abdominal pain.  Genitourinary:  Negative for dysuria.  Neurological:  Negative for dizziness.      Objective:     BP 123/87   Pulse 85   Resp 16   Ht 5' 3 (1.6 m)   Wt 217 lb (98.4 kg)   LMP 07/18/2011   SpO2 93%   BMI 38.44 kg/m  BP Readings from Last 3 Encounters:  05/19/24 123/87  05/05/24 123/86  03/10/24 (!) 169/95      Physical Exam Vitals reviewed.  Constitutional:      General: She is not in acute distress.    Appearance: Normal appearance. She is not ill-appearing, toxic-appearing or diaphoretic.  HENT:     Head: Normocephalic.  Eyes:     General:        Right eye: No discharge.        Left eye: No discharge.     Conjunctiva/sclera: Conjunctivae normal.  Cardiovascular:     Rate and Rhythm: Normal rate.     Pulses: Normal pulses.     Heart sounds: Normal heart sounds.  Pulmonary:     Effort: Pulmonary effort is normal. No respiratory distress.     Breath sounds: Normal breath sounds.  Skin:    General: Skin is warm and dry.     Capillary Refill:  Capillary refill takes less than 2 seconds.  Neurological:     General: No focal deficit present.     Mental Status: She is alert and oriented to person, place, and time.     Coordination: Coordination normal.     Gait: Gait abnormal.  Psychiatric:        Mood and Affect: Mood normal.        Behavior: Behavior normal.      No results found for any visits on 05/19/24.  The ASCVD Risk score (Arnett DK, et al., 2019) failed to calculate for the following reasons:   Risk score cannot be calculated because patient has a medical history suggesting prior/existing ASCVD    Assessment & Plan:  Primary hypertension -     BMP8+eGFR -     CBC with Differential/Platelet -     amLODIPine  Besylate; Take 1 tablet (10 mg total) by mouth daily.  Dispense: 90 tablet; Refill: 1 -     Metoprolol  Tartrate; Take 2 tablets (200 mg total) by mouth 2 (two) times daily.  Dispense: 360 tablet; Refill: 1  Neuropathy -     Lidocaine ; Place 3 patches onto the skin daily. Remove & Discard patch within 12 hours or as directed by MD  Dispense: 90 patch; Refill: 2  Mixed hyperlipidemia Assessment &  Plan: Currently on lipitor  80 mg once daily Previous LDL 108 Repeat Lipid panel done today Discussed Limit: Saturated fats: Butter, cream, fatty meats. Trans fats: Fried foods, processed snacks. Sugar and refined carbs: Sweets, white bread. Focus on whole foods, healthy fats, and fiber to improve heart health! Maintain an exercise routine 3 to 5 days a week for a minimum total of 150 minutes.   Orders: -     Lipid panel  Vitamin B12 deficiency -     Vitamin B12  Vitamin D deficiency -     VITAMIN D 25 Hydroxy (Vit-D Deficiency, Fractures)  Screen for colon cancer -     Cologuard  Prediabetes -     Hemoglobin A1c  Essential hypertension Assessment & Plan: Controlled, continue Metoprolol  100 mg 2 times daily, amlodipine  10 mg once daily Labs ordered Continued discussion on DASH diet, low sodium diet  and maintain a exercise routine for 150 minutes per week.       Return in about 4 months (around 09/18/2024), or if symptoms worsen or fail to improve, for chronic follow-up.   Hilario Kidd Wilhelmena Falter, FNP

## 2024-05-24 ENCOUNTER — Ambulatory Visit: Admitting: Physical Therapy

## 2024-06-06 ENCOUNTER — Encounter: Payer: Self-pay | Admitting: Cardiology

## 2024-06-06 ENCOUNTER — Ambulatory Visit: Attending: Cardiology | Admitting: Cardiology

## 2024-06-06 VITALS — BP 124/60 | Ht 63.0 in | Wt 218.0 lb

## 2024-06-06 DIAGNOSIS — I739 Peripheral vascular disease, unspecified: Secondary | ICD-10-CM

## 2024-06-06 DIAGNOSIS — I4819 Other persistent atrial fibrillation: Secondary | ICD-10-CM | POA: Diagnosis not present

## 2024-06-06 DIAGNOSIS — I10 Essential (primary) hypertension: Secondary | ICD-10-CM | POA: Diagnosis not present

## 2024-06-06 DIAGNOSIS — E782 Mixed hyperlipidemia: Secondary | ICD-10-CM | POA: Diagnosis not present

## 2024-06-06 DIAGNOSIS — I25119 Atherosclerotic heart disease of native coronary artery with unspecified angina pectoris: Secondary | ICD-10-CM

## 2024-06-06 DIAGNOSIS — Z8673 Personal history of transient ischemic attack (TIA), and cerebral infarction without residual deficits: Secondary | ICD-10-CM

## 2024-06-06 NOTE — Patient Instructions (Signed)
 Medication Instructions:  Your physician recommends that you continue on your current medications as directed. Please refer to the Current Medication list given to you today.   Labwork:  In 3 Months (December 2025) FASTING Lipids  Testing/Procedures: None today  Follow-Up: 3 months  Any Other Special Instructions Will Be Listed Below (If Applicable).  If you need a refill on your cardiac medications before your next appointment, please call your pharmacy.

## 2024-06-06 NOTE — Progress Notes (Signed)
 Cardiology Office Note  Date: 06/06/2024   ID: LUZ MARES, DOB June 06, 1961, MRN [id number]  History of Present Illness: [name] is a 63 y.o. female last seen in May 2024.  She is here today with family member for a follow-up visit.  I reviewed substantial interval records.  She has had evaluations through [facility] cardiology in March and more recently in July for management of atrial fibrillation, newly documented in February.  She more recently was found to have evidence of acute stroke, right PCA infarct by evaluation at [facility] in June.  Ultimately hospitalized at [facility].  She had not been on Eliquis at the time of her stroke.  Echocardiogram and carotid Dopplers are outlined below.  She was found to have right mid posterior cerebral artery occlusion and occlusion of the left ICA by MRA.  She is undergoing outpatient rehabilitation twice a week in the evening.  She does not report any focal motor deficits.  She quit smoking back in June, used nicotine  patches temporarily.  She has not relapsed at this point.  Medications reviewed.  She reports compliance with current regimen including Eliquis and Lipitor .  No spontaneous bleeding problems reported.  Her LDL was 170 in June, she tells me that she had not been taking Lipitor  for about 6 months prior to that.  She does not report any sense of palpitations, no obvious angina or interval nitroglycerin  use.  No claudication at current level of activity.  Physical Exam: VS:  BP 124/60 (BP Location: Left Arm, Cuff Size: Normal)   Ht 5' 3 (1.6 m)   Wt 218 lb (98.9 kg)   LMP 07/18/2011   BMI 38.62 kg/m , BMI Body mass index is 38.62 kg/m.  Wt Readings from Last 3 Encounters:  06/06/24 218 lb (98.9 kg)  05/19/24 217 lb (98.4 kg)  05/05/24 212 lb 1.3 oz (96.2 kg)    General: Patient appears comfortable at rest. HEENT: Conjunctiva and lids normal. Neck: Supple, no elevated JVP, bilateral carotid bruits as before. Lungs:  Clear to auscultation, nonlabored breathing at rest. Cardiac: Irregularly irregular, no S3, 2/6 systolic murmur, no pericardial rub. Extremities: No pitting edema.  ECG:  An ECG dated 01/06/2024 was personally reviewed today and demonstrated:  Coarse atrial fibrillation/flutter with old inferior/anterior infarct pattern.  Labwork: 01/06/2024: BUN 12; Creatinine, Ser 1.04; Potassium 4.4; Sodium 143; TSH 1.430     Component Value Date/Time   CHOL 176 01/06/2024 1137   TRIG 185 (H) 01/06/2024 1137   HDL 36 (L) 01/06/2024 1137   CHOLHDL 4.9 (H) 01/06/2024 1137   CHOLHDL 6.4 12/24/2021 1210   VLDL 38 12/24/2021 1210   LDLCALC 108 (H) 01/06/2024 1137  June 2025: Cholesterol 236, triglycerides 162, HDL 34, LDL 170  Other Studies Reviewed Today:  Carotid Dopplers 03/25/2024 ([facility]): IMPRESSION:  1. 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  2.  Patent left external carotid and bilateral vertebral arteries.   3.  Approximately 50% luminal narrowing of the distal right common carotid artery by grayscale evaluation.   Echocardiogram 03/26/2024 [facility]): Left Ventricle  Left ventricle size is normal. Wall thickness is normal. Systolic function is normal. EF: 60-65%. Wall motion is normal. Unable to assess diastolic function due to atrial fibrillation.   Right Ventricle  Right ventricle size is normal. Systolic function is normal.   Left Atrium  Left atrium is mildly dilated. Injection of agitated saline documents no interatrial shunt.   Right Atrium  Right atrium  size is normal.   IVC/SVC  The inferior vena cava demonstrates a diameter of <=2.1 cm and collapses >50%; therefore, the right atrial pressure is estimated at 3 mmHg.   Mitral Valve  The leaflets are mildly thickened. There is mild annular calcification. There is mild regurgitation.   Tricuspid Valve  Tricuspid valve structure is normal. There is trace regurgitation. Unable to assess RVSP due to incomplete  Doppler signal.   Aortic Valve  The aortic valve is tricuspid. The leaflets are not thickened and exhibit normal excursion. There is no regurgitation or stenosis.   Pulmonic Valve  The pulmonic valve was not well visualized. Trace regurgitation. There is no evidence of pulmonic valve stenosis.   Ascending Aorta  The aortic root is normal in size. The ascending aorta is normal in size.   Pericardium  There is no pericardial effusion. The pericardium has a fat pad.   Assessment and Plan:  1.  CAD with moderate LAD disease extending into the diagonal system and chronic total occlusion of the RCA managed medically as of 2017 angiography.  LVEF 60 to 65% by echocardiogram in June through [facility].  She does not report any active angina at current level of activity.  Continue aspirin  81 mg daily, she reports compliance with Lipitor  80 mg daily at this point as well.  As needed nitroglycerin  available.  2.  Persistent atrial fibrillation documented in February of this year as discussed above.  CHA2DS2-VASc score is 6.  Diagnosed with acute right PCA infarct in June of this year.  She does not report any palpitations.  Heart rate is adequately controlled on Lopressor  200 mg twice daily.  Continue Eliquis 5 mg twice daily for stroke prophylaxis.   3.  Mixed hyperlipidemia, lipid panel in June showed LDL 170.  She had not been compliant with Lipitor  for 6 months prior to that measurement.  Continue Lipitor  80 mg daily.  Recheck FLP in 3 months.   4.  Primary hypertension.  No change in current regimen including Norvasc  10 mg daily and clonidine  0.2 mg twice daily.  5.  PAD, carotid artery disease, and AAA followed by [facility].  She has known occlusion of the LICA, AAA measured 5 cm by duplex most recently.  She sees Dr. Eliza.  6.  Tobacco abuse in remission, states that she quit in June.  Disposition:  Follow up 3 months.  Signed, Jayson JUDITHANN Sierras, M.D., F.A.C.C. [facility]

## 2024-07-12 ENCOUNTER — Encounter: Payer: Self-pay | Admitting: Physical Therapy

## 2024-07-12 ENCOUNTER — Ambulatory Visit: Attending: Nurse Practitioner | Admitting: Physical Therapy

## 2024-07-12 DIAGNOSIS — R2689 Other abnormalities of gait and mobility: Secondary | ICD-10-CM | POA: Diagnosis present

## 2024-07-12 DIAGNOSIS — M6281 Muscle weakness (generalized): Secondary | ICD-10-CM | POA: Insufficient documentation

## 2024-07-12 NOTE — Therapy (Signed)
 OUTPATIENT PHYSICAL THERAPY TREATMENT   Patient Name: [name] MRN: [id number] DOB:1961/01/09, 63 y.o., female Today's Date: 07/12/2024  END OF SESSION:  PT End of Session - 07/12/24 1449     Visit Number 6    Number of Visits 12    Date for Recertification  09/06/24    Authorization Type MCR MCD    PT Start Time 1420    PT Stop Time 1458    PT Time Calculation (min) 38 min    Activity Tolerance Patient tolerated treatment well    Behavior During Therapy [facility] for tasks assessed/performed             Past Medical History:  Diagnosis Date   AAA (abdominal aortic aneurysm) without rupture 08/25/2016   Anxiety    Arthritis    Bipolar 1 disorder (HCC)    CAD (coronary artery disease)    a. 08/08/16 LAD 70%, 60% 1st diag, CTO RCA Med Rx EF 55%   Carotid artery stenosis    Right carotid endarterectomy 07/22/05 with recurrent stenosis 01/2017; known left ICA occlusion   COPD (chronic obstructive pulmonary disease) (HCC)    Crack cocaine use    Depression    Headache    Hypertension    Myocardial infarction [facility])    Right Groin Hematoma    a. 07/2016 following diagnostic cath   Stroke [facility]) 2017   Past Surgical History:  Procedure Laterality Date   CARDIAC CATHETERIZATION N/A 08/07/2016   Procedure: Left Heart Cath and Coronary Angiography;  Surgeon: Debby DELENA Sor, MD;  Location: [facility] INVASIVE CV LAB;  Service: Cardiovascular;  Laterality: N/A;   CYST REMOVAL TRUNK Left    beneath L breast   ENDARTERECTOMY Right 02/08/2018   Procedure: ENDARTERECTOMY CAROTID REDO;  Surgeon: Eliza Lonni RAMAN, MD;  Location: [facility] OR;  Service: Vascular;  Laterality: Right;   TUBAL LIGATION     VASCULAR SURGERY Right    right carotid endarterectomy 07/22/05    Patient Active Problem List   Diagnosis Date Noted   Hospital discharge follow-up 05/05/2024   Acute cystitis with hematuria 03/26/2024   Cerebrovascular accident (CVA) due to embolism of right posterior cerebral  artery (HCC) 03/24/2024   Acute ischemic right PCA stroke (HCC) 03/23/2024   Prediabetes 07/30/2023   Hyperlipidemia 07/30/2023   Cigarette smoker 05/25/2023   AKI (acute kidney injury) 05/23/2023   CAP (community acquired pneumonia) 05/23/2023   Leukocytosis 05/23/2023   Acute respiratory failure with hypoxia and hypercarbia (HCC) 05/23/2023   Odynophagia 05/18/2023   GERD (gastroesophageal reflux disease) 05/18/2023   History of COPD 05/06/2023   Chronic bronchitis (HCC) 04/28/2023   Dysphagia 04/28/2023   Gastroenteritis 03/02/2023   Spinal stenosis of lumbar region 03/02/2023   PVD (peripheral vascular disease) 12/24/2021   Carotid stenosis 02/08/2018   AAA (abdominal aortic aneurysm) without rupture 08/25/2016   Carotid disease, bilateral 08/18/2016   Cardiomyopathy, ischemic 08/18/2016   CAD (coronary artery disease) 08/08/2016   Acute blood loss anemia 08/08/2016   Dyslipidemia 08/08/2016   Bipolar 1 disorder (HCC)    Essential hypertension    Anxiety    Crack cocaine use    Tobacco abuse    Right Groin Hematoma    Chest pain 08/07/2016   Unstable angina pectoris (HCC) 08/06/2016    PCP: Terry Wilhelmena Lloyd Hilario, FNP   REFERRING PROVIDER: Delight Mom   REFERRING DIAG: [phone] (ICD-10-CM) - Cerebral infarction due to embolism of right posterior cerebral artery   Rationale for Evaluation  and Treatment:  Rehabiliation  THERAPY DIAG:  Muscle weakness (generalized)  Other abnormalities of gait and mobility  ONSET DATE: 03/21/24 CVA   SUBJECTIVE:                                                                                                                                                                                           SUBJECTIVE STATEMENT: She relays pain is bad in her left leg today but no pain otherwise  PERTINENT HISTORY:  History of Present Illness: [name] is a 63 y.o. female with a past medical history of essential hypertension,  bilateral carotid artery disease (including left common carotid occlusion and prior right CEA), infrarenal abdominal aortic aneurysm (now measuring 5 cm), non-insulin -dependent type 2 diabetes, ongoing tobacco abuse, and prior substance abuse (quit approximately 5 years ago), cardiomyopathy with LV recovery, hyperlipidemia, and tobacco abuse who presented to the emergency department at [facility] on 03/24/2024 complaining of a 1 day history of sudden onset of visual field deficit. NIHSS was 1. Head CT at the outside hospital revealed right occipital ischemic changes. CTA head and neck showed a right mid posterior cerebral artery occlusion and LEFT ICA occlusion. She was then transferred to [facility]), and admitted to the Neurology service for further management and stroke work up.   PAIN: NPRS scale: 7/10 in left leg Pain description: constant, achy, sharp Aggravating factors: walking Relieving factors: rest,sitting   PRECAUTIONS: ,  None  RED FLAGS: None   WEIGHT BEARING RESTRICTIONS:  No  FALLS:  Has patient fallen in last 6 months? No   OCCUPATION:  None, disability  PLOF:  She can shower herself and use the bathroom herself but mostly Needs assistance with ADLs  PATIENT GOALS:  Walking more.   OBJECTIVE:  Note: Objective measures were completed at Evaluation unless otherwise noted.  DIAGNOSTIC FINDINGS:  MRI IMPRESSION:   1. Acute RIGHT PCA distribution infarct. No hemorrhage.  2. Occlusion of the LEFT ICA.  3. Old LEFT frontal infarct. Additional old lacunar infarcts.  4. Cerebral atrophy with moderate white matter disease reflecting gliosis secondary to chronic microvascular ischemia.   PATIENT SURVEYS:  Patient-Specific Activity Scoring Scheme  0 represents "unable to perform." 10 represents "able to perform at prior level. 0 1 2 3 4 5 6 7 8 9  10 (Date and Score)   Activity Eval     1. Sit to chair 7     2. Walking 5  minutes 2     3.     4.    5.    Score 4.5/10  Total score = sum of the activity scores/number of activities Minimum detectable change (90%CI) for average score = 2 points Minimum detectable change (90%CI) for single activity score = 3 points   GAIT: Distance walked: limited to household distances due to pain and weakness Assistive device utilized: Environmental consultant - 4 wheeled Level of assistance: SBA   Visual changes: reports blurry vision since stroke  Sensation: WNL  Memory: reports issues since stroke   UPPER EXTREMITY ROM: WNL    UPPER EXTREMITY FFU:hmnddob 5/5 MMT bilat     LOWER EXTREMITY MMT:    MMT Right eval Left eval Left 05/16/24  Hip flexion 4 3 4   Hip extension     Hip abduction     Hip adduction     Hip internal rotation     Hip external rotation     Knee flexion 5 4   Knee extension 5 4   Ankle dorsiflexion     Ankle plantarflexion     Ankle inversion     Ankle eversion      (Blank rows = not tested)   FUNCTIONAL TESTS:  Eval: 30 second chair stand test: 6 reps 2 minute walk test: 111 feet, one seated rest break needed and unable to continue until 2 min was up. Used RW and CGA needed                                                                                                                              TREATMENT DATE:  07/12/24 Therex: Nu step L3 X 15 min for endurance and leg strength HEP review  Theractivity: Wheelchair to chair transfer, stand pivot transfer to and from, mod I now Sit to stand from wheelchair with UE support X 4 reps during session, supervision Ambulation with RW and supervison 100 feet Standing marches X 10   PATIENT EDUCATION: Education details: HEP, PT plan of care, selfcare Person educated: Patient Education method: Explanation, Demonstration, Verbal cues, and Handouts Education comprehension: verbalized understanding, further education recommended   HOME EXERCISE PROGRAM: Access Code: [email or web address] URL:  [email or web address] Date: 04/11/2024 Prepared by: Redell Moose  Exercises - Seated Straight Leg Raise with Quad Contraction  - 2 x daily - 6 x weekly - 3 sets - 5 reps - Seated Knee Extension with Resistance  - 2 x daily - 6 x weekly - 3 sets - 10 reps - Single Leg Sit to Stand with Arm Support  - 2 x daily - 6 x weekly - 2 sets - 5 reps - alternating marching  - 2 x daily - 6 x weekly - 1 sets - 10 reps - Seated Gaze Stabilization with Head Rotation  - 2 x daily - 6 x weekly - 1 sets - 2 reps - 30 sec hold - Seated Gaze Stabilization with Head Nod and Vertical Arm Movement  - 2 x daily - 6 x weekly - 1 sets - 2 reps - 30 sec hold -  Standing Romberg to 1/2 Tandem Stance  - 2 x daily - 6 x weekly - 1 sets - 3 reps - 15 sec hold  ASSESSMENT:  CLINICAL IMPRESSION: She has missed several weeks of PT but has not regressed too much functionally. She was able to continue with same program she was on but was limited some due to left leg pain today.  Skilled PT remains recommended to improve her functional abilities.   OBJECTIVE IMPAIRMENTS: decreased activity tolerance for ADL's, difficulty walking, decreased balance, decreased endurance, decreased mobility, decreased ROM, decreased strength, impaired flexibility, and pain.  ACTIVITY LIMITATIONS: bending, lifting, carry, locomotion, standing, walking, vision   PERSONAL FACTORS: see above PMH  also affecting patient's functional outcome.  REHAB POTENTIAL: Fair , limited by lack of motivation to attend PT  CLINICAL DECISION MAKING: Evolving/moderate complexity  EVALUATION COMPLEXITY: Moderate    GOALS: Short term PT Goals Target date: 05/09/2024   Pt will be I and compliant with HEP. Baseline:  Goal status: ongoing 05/16/24  Long term PT goals Target date:05/23/2024 Pt will improve leg strength to at least 4+/5 MMT to improve functional strength Baseline: Goal status: ongoing 05/16/24 Pt will improve Patient specific  functional scale (PSFS) to at least 6/10 to show improved function level Baseline: Goal status: ongoing 05/16/24 Pt will improve 30 second chair stand test to 9 or more reps Baseline:6 Goal status: ongoing 05/16/24 Pt will be able to ambulate at least 150 feet on 2 minute walk test. Baseline:111 Goal status: ongoing 05/16/24  PLAN: PT FREQUENCY: 1-3 times per week   PT DURATION: 6 weeks  PLANNED INTERVENTIONS (unless contraindicated):  97110-Therapeutic exercises, 97530- Therapeutic activity, 97112- Neuromuscular re-education, 97535- Self Care, [id number]- Manual therapy, [phone]- Gait training, Balance training, Vestibular training, and Visual/preceptual remediation/compensation  PLAN FOR NEXT SESSION:  work on leg strength, gait, balance, endurance   Redell JONELLE Moose, PT,DPT 07/12/2024, 4:46 PM

## 2024-07-21 ENCOUNTER — Ambulatory Visit: Admitting: Physical Therapy

## 2024-08-16 ENCOUNTER — Ambulatory Visit: Attending: Nurse Practitioner | Admitting: Physical Therapy

## 2024-08-16 ENCOUNTER — Encounter: Payer: Self-pay | Admitting: Physical Therapy

## 2024-08-16 DIAGNOSIS — R2689 Other abnormalities of gait and mobility: Secondary | ICD-10-CM | POA: Insufficient documentation

## 2024-08-16 DIAGNOSIS — M6281 Muscle weakness (generalized): Secondary | ICD-10-CM | POA: Insufficient documentation

## 2024-08-16 NOTE — Therapy (Signed)
 OUTPATIENT PHYSICAL THERAPY TREATMENT   Patient Name: [name] MRN: [id number] DOB:[birth year], 63 y.o., female Today's Date: 08/16/2024  END OF SESSION:  PT End of Session - 08/16/24 1507     Visit Number 7    Number of Visits 12    Date for Recertification  09/06/24    Authorization Type MCR MCD    PT Start Time 1450    PT Stop Time 1528    PT Time Calculation (min) 38 min    Activity Tolerance Patient tolerated treatment well    Behavior During Therapy [facility] for tasks assessed/performed             Past Medical History:  Diagnosis Date   AAA (abdominal aortic aneurysm) without rupture 08/25/2016   Anxiety    Arthritis    Bipolar 1 disorder (HCC)    CAD (coronary artery disease)    a. 08/08/16 LAD 70%, 60% 1st diag, CTO RCA Med Rx EF 55%   Carotid artery stenosis    Right carotid endarterectomy 07/22/05 with recurrent stenosis 01/2017; known left ICA occlusion   COPD (chronic obstructive pulmonary disease) (HCC)    Crack cocaine use    Depression    Headache    Hypertension    Myocardial infarction [facility])    Right Groin Hematoma    a. 07/2016 following diagnostic cath   Stroke [facility]) 2017   Past Surgical History:  Procedure Laterality Date   CARDIAC CATHETERIZATION N/A 08/07/2016   Procedure: Left Heart Cath and Coronary Angiography;  Surgeon: Debby DELENA Sor, MD;  Location: [facility] INV[facility];  Service: Cardiovascular;  Laterality: N/A;   CYST REMOVAL TRUNK Left    beneath L breast   ENDARTERECTOMY Right 02/08/2018   Procedure: ENDARTERECTOMY CAROTID REDO;  Surgeon: Eliza Lonni RAMAN, MD;  Location: [facility] OR;  Service: Vascular;  Laterality: Right;   TUBAL LIGATION     VASCULAR SURGERY Right    right carotid endarterectomy 07/22/05    Patient Active Problem List   Diagnosis Date Noted   Hospital discharge follow-up 05/05/2024   Acute cystitis with hematuria 03/26/2024   Cerebrovascular accident (CVA) due to embolism of right posterior cerebral  artery (HCC) 03/24/2024   Acute ischemic right PCA stroke (HCC) 03/23/2024   Prediabetes 07/30/2023   Hyperlipidemia 07/30/2023   Cigarette smoker 05/25/2023   AKI (acute kidney injury) 05/23/2023   CAP (community acquired pneumonia) 05/23/2023   Leukocytosis 05/23/2023   Acute respiratory failure with hypoxia and hypercarbia (HCC) 05/23/2023   Odynophagia 05/18/2023   GERD (gastroesophageal reflux disease) 05/18/2023   History of COPD 05/06/2023   Chronic bronchitis (HCC) 04/28/2023   Dysphagia 04/28/2023   Gastroenteritis 03/02/2023   Spinal stenosis of lumbar region 03/02/2023   PVD (peripheral vascular disease) 12/24/2021   Carotid stenosis 02/08/2018   AAA (abdominal aortic aneurysm) without rupture 08/25/2016   Carotid disease, bilateral 08/18/2016   Cardiomyopathy, ischemic 08/18/2016   CAD (coronary artery disease) 08/08/2016   Acute blood loss anemia 08/08/2016   Dyslipidemia 08/08/2016   Bipolar 1 disorder (HCC)    Essential hypertension    Anxiety    Crack cocaine use    Tobacco abuse    Right Groin Hematoma    Chest pain 08/07/2016   Unstable angina pectoris (HCC) 08/06/2016    PCP: Terry Wilhelmena Lloyd Hilario, FNP   REFERRING PROVIDER: Delight Mom   REFERRING DIAG: [phone] (ICD-10-CM) - Cerebral infarction due to embolism of right posterior cerebral artery   Rationale for Evaluation  and Treatment:  Rehabiliation  THERAPY DIAG:  Muscle weakness (generalized)  Other abnormalities of gait and mobility  ONSET DATE: 03/21/24 CVA   SUBJECTIVE:                                                                                                                                                                                           SUBJECTIVE STATEMENT: She relays pain is about 7/10 overall. She did feel her leg give out on her once. She wants to try to come to PT more regularly.   PERTINENT HISTORY:  History of Present Illness: [name] is a 63 y.o.  female with a past medical history of essential hypertension, bilateral carotid artery disease (including left common carotid occlusion and prior right CEA), infrarenal abdominal aortic aneurysm (now measuring 5 cm), non-insulin -dependent type 2 diabetes, ongoing tobacco abuse, and prior substance abuse (quit approximately 5 years ago), cardiomyopathy with LV recovery, hyperlipidemia, and tobacco abuse who presented to the emergency department at [facility] on 03/24/2024 complaining of a 1 day history of sudden onset of visual field deficit. NIHSS was 1. Head CT at the outside hospital revealed right occipital ischemic changes. CTA head and neck showed a right mid posterior cerebral artery occlusion and LEFT ICA occlusion. She was then transferred to [facility] and admitted to the Neurology service for further management and stroke work up.   PAIN: NPRS scale: 7/10 in left leg Pain description: constant, achy, sharp Aggravating factors: walking Relieving factors: rest,sitting   PRECAUTIONS: ,  None  RED FLAGS: None   WEIGHT BEARING RESTRICTIONS:  No  FALLS:  Has patient fallen in last 6 months? No   OCCUPATION:  None, disability  PLOF:  She can shower herself and use the bathroom herself but mostly Needs assistance with ADLs  PATIENT GOALS:  Walking more.   OBJECTIVE:  Note: Objective measures were completed at Evaluation unless otherwise noted.  DIAGNOSTIC FINDINGS:  MRI IMPRESSION:   1. Acute RIGHT PCA distribution infarct. No hemorrhage.  2. Occlusion of the LEFT ICA.  3. Old LEFT frontal infarct. Additional old lacunar infarcts.  4. Cerebral atrophy with moderate white matter disease reflecting gliosis secondary to chronic microvascular ischemia.   PATIENT SURVEYS:  Patient-Specific Activity Scoring Scheme  0 represents "unable to perform." 10 represents "able to perform at prior level. 0 1 2 3 4 5 6 7 8 9  10 (Date and  Score)   Activity Eval     1. Sit to chair 7     2. Walking 5 minutes 2     3.  4.    5.    Score 4.5/10    Total score = sum of the activity scores/number of activities Minimum detectable change (90%CI) for average score = 2 points Minimum detectable change (90%CI) for single activity score = 3 points   GAIT: Distance walked: limited to household distances due to pain and weakness Assistive device utilized: [employer] - 4 wheeled Level of assistance: SBA   Visual changes: reports blurry vision since stroke  Sensation: WNL  Memory: reports issues since stroke   UPPER EXTREMITY ROM: WNL    UPPER EXTREMITY FFU:hmnddob 5/5 MMT bilat     LOWER EXTREMITY MMT:    MMT Right eval Left eval Left 05/16/24 Right/Left 08/16/24  Hip flexion 4 3 4  4+/4+  Hip extension      Hip abduction      Hip adduction      Hip internal rotation      Hip external rotation      Knee flexion 5 4  4+/4+  Knee extension 5 4  4+/4+  Ankle dorsiflexion      Ankle plantarflexion      Ankle inversion      Ankle eversion       (Blank rows = not tested)   FUNCTIONAL TESTS:  Eval: 30 second chair stand test: 6 reps 2 minute walk test: 111 feet, one seated rest break needed and unable to continue until 2 min was up. Used RW and CGA needed  08/16/24 30 second chair stand test: 7 reps 2 minute walk test: 130 feet, one seated rest break needed and unable to continue until 2 min was up. Used RW and CGA needed                                                                                                                               TREATMENT DATE:  07/12/24 Therex: Nu step L5 X 15 min for endurance and leg strength HEP review  Theractivity: Wheelchair to chair transfer, stand pivot transfer to and from, mod I now Sit to stand from wheelchair with UE support X 4 reps during session, supervision Ambulation with RW and supervison 130 feet Sit to stand X 7 reps in 30 sec  PATIENT  EDUCATION: Education details: HEP, PT plan of care, selfcare Person educated: Patient Education method: Explanation, Demonstration, Verbal cues, and Handouts Education comprehension: verbalized understanding, further education recommended   HOME EXERCISE PROGRAM: Access Code: [email or web address] URL: [email or web address] Date: 04/11/2024 Prepared by: Redell Moose  Exercises - Seated Straight Leg Raise with Quad Contraction  - 2 x daily - 6 x weekly - 3 sets - 5 reps - Seated Knee Extension with Resistance  - 2 x daily - 6 x weekly - 3 sets - 10 reps - Single Leg Sit to Stand with Arm Support  - 2 x daily - 6 x weekly - 2 sets - 5 reps - alternating marching  - 2 x daily -  6 x weekly - 1 sets - 10 reps - Seated Gaze Stabilization with Head Rotation  - 2 x daily - 6 x weekly - 1 sets - 2 reps - 30 sec hold - Seated Gaze Stabilization with Head Nod and Vertical Arm Movement  - 2 x daily - 6 x weekly - 1 sets - 2 reps - 30 sec hold - Standing Romberg to 1/2 Tandem Stance  - 2 x daily - 6 x weekly - 1 sets - 3 reps - 15 sec hold  ASSESSMENT:  CLINICAL IMPRESSION: She has not attended PT as prescribed, she is aware of this and does plan to attend more often, she wants to be able to walk better by christmas time. I did retest her functional tests today and strength and she has made some small improvements despite not coming to PT much.  She has not yet met PT goals so further PT recommended.  OBJECTIVE IMPAIRMENTS: decreased activity tolerance for ADL's, difficulty walking, decreased balance, decreased endurance, decreased mobility, decreased ROM, decreased strength, impaired flexibility, and pain.  ACTIVITY LIMITATIONS: bending, lifting, carry, locomotion, standing, walking, vision   PERSONAL FACTORS: see above PMH  also affecting patient's functional outcome.  REHAB POTENTIAL: Fair , limited by lack of motivation to attend PT  CLINICAL DECISION MAKING: Evolving/moderate  complexity  EVALUATION COMPLEXITY: Moderate    GOALS: Short term PT Goals Target date: 05/09/2024   Pt will be I and compliant with HEP. Baseline:  Goal status: ongoing 08/16/24, has not been compliant with them as prescribed but plans to improve this  Long term PT goals Target date:09/06/24 Pt will improve leg strength to at least 4+/5 MMT to improve functional strength Baseline: Goal status: MET 08/16/24 Pt will improve Patient specific functional scale (PSFS) to at least 6/10 to show improved function level Baseline: Goal status: ongoing 08/16/24 Pt will improve 30 second chair stand test to 9 or more reps Baseline:6 Goal status: ongoing 08/16/24 Pt will be able to ambulate at least 150 feet on 2 minute walk test. Baseline:111 Goal status: ongoing 08/16/24  PLAN: PT FREQUENCY: 1-3 times per week   PT DURATION: 6 weeks  PLANNED INTERVENTIONS (unless contraindicated):  97110-Therapeutic exercises, 97530- Therapeutic activity, 97112- Neuromuscular re-education, 97535- Self Care, [id number]- Manual therapy, [phone]- Gait training, Balance training, Vestibular training, and Visual/preceptual remediation/compensation  PLAN FOR NEXT SESSION:  work on leg strength, gait, balance, endurance   Redell JONELLE Moose, PT,DPT 08/16/2024, 3:29 PM

## 2024-09-01 ENCOUNTER — Ambulatory Visit

## 2024-09-01 ENCOUNTER — Ambulatory Visit (HOSPITAL_COMMUNITY)

## 2024-09-06 ENCOUNTER — Ambulatory Visit: Attending: Cardiology | Admitting: Cardiology

## 2024-09-11 ENCOUNTER — Encounter: Payer: Self-pay | Admitting: Cardiology

## 2024-09-12 ENCOUNTER — Telehealth: Payer: Self-pay | Admitting: Cardiology

## 2024-09-12 NOTE — Telephone Encounter (Signed)
 Pt's niece came into office and said that she had a EKG done at an appt today. She was told she needed to contact her cardiologist because she is in afib and last time she was in afib she had a stroke! They are very concerned!   Scanned EKG into chart

## 2024-09-12 NOTE — Telephone Encounter (Signed)
 Last appointment 09/06/2024 with Debera was a No Show. Called and left message with son to have patient call office to schedule appointment.  [facility] contacted to confirm eliquis refill history and was advised that [facility] does not dispense eliquis. [facility] contacted to verify eliquis refill history and was advised that eliquis was last filled 09/12/24 #60; 08/16/24 #60 prescribed by Dr. Youlanda Juba w/[facility].

## 2024-09-14 ENCOUNTER — Ambulatory Visit: Admitting: Family Medicine

## 2024-09-14 NOTE — Telephone Encounter (Signed)
 Seen by [facility] today at 9:40 am Dr. Youlanda Pollack.

## 2024-09-19 ENCOUNTER — Ambulatory Visit: Attending: Nurse Practitioner | Admitting: Physical Therapy

## 2024-09-22 ENCOUNTER — Other Ambulatory Visit: Payer: Self-pay | Admitting: Family Medicine

## 2024-09-22 DIAGNOSIS — I10 Essential (primary) hypertension: Secondary | ICD-10-CM

## 2024-09-22 NOTE — Telephone Encounter (Signed)
 Copied from CRM #[id number]. Topic: Clinical - Medication Refill >> Sep 22, 2024 10:25 AM Victoria A wrote: Medication: apixaban (ELIQUIS) 5 MG TABS tablet; metoprolol  tartrate (LOPRESSOR ) 100 MG tablet  Has the patient contacted their pharmacy? No (Agent: If no, request that the patient contact the pharmacy for the refill. If patient does not wish to contact the pharmacy document the reason why and proceed with request.) (Agent: If yes, when and what did the pharmacy advise?)  This is the patient's preferred pharmacy:  [id number] [place] [place] - [address] [place] [address] Phone: [phone] Fax: [phone]  Is this the correct pharmacy for this prescription? Yes If no, delete pharmacy and type the correct one.   Has the prescription been filled recently? No  Is the patient out of the medication? Yes  Has the patient been seen for an appointment in the last year OR does the patient have an upcoming appointment? Yes  Can we respond through MyChart? Yes  Agent: Please be advised that Rx refills may take up to 3 business days. We ask that you follow-up with your pharmacy.

## 2024-09-25 MED ORDER — APIXABAN 5 MG PO TABS: 5.0000 mg | ORAL_TABLET | Freq: Two times a day (BID) | ORAL | 0 refills | Status: AC

## 2024-09-25 MED ORDER — METOPROLOL TARTRATE 100 MG PO TABS: 200.0000 mg | ORAL_TABLET | Freq: Two times a day (BID) | ORAL | 1 refills | Status: AC

## 2024-09-26 ENCOUNTER — Ambulatory Visit: Admitting: Physical Therapy

## 2024-09-27 ENCOUNTER — Ambulatory Visit: Admitting: Cardiology

## 2024-09-30 ENCOUNTER — Other Ambulatory Visit: Payer: Self-pay | Admitting: Family Medicine

## 2024-10-02 ENCOUNTER — Other Ambulatory Visit (HOSPITAL_COMMUNITY): Payer: Self-pay

## 2024-10-02 ENCOUNTER — Telehealth: Payer: Self-pay | Admitting: Pharmacy Technician

## 2024-10-02 NOTE — Telephone Encounter (Signed)
 Pharmacy Patient Advocate Encounter - (pharmacy has pt's name as [name], but ins has [name] like EPIC)   Received notification from [facility] KEYthat prior authorization for LIdocaine  5% patches is required/requested.   Insurance verification completed.   The patient is insured through [facility].   Per test claim: PA required; PA submitted to above mentioned insurance via Latent Key/confirmation #/EOC [id number] Status is pending

## 2024-10-03 ENCOUNTER — Ambulatory Visit: Admitting: Physical Therapy

## 2024-10-04 ENCOUNTER — Ambulatory Visit: Admitting: Nurse Practitioner

## 2024-10-04 ENCOUNTER — Ambulatory Visit: Admitting: Physical Therapy

## 2024-10-04 ENCOUNTER — Telehealth: Payer: Self-pay | Admitting: Physical Therapy

## 2024-10-04 NOTE — Progress Notes (Deleted)
 "  Subjective:    Patient ID: [name], female    DOB: October 23, 1960, 64 y.o.   MRN: [id number]  Chief Complaint: medical management of chronic issues     HPI:  [name] is a 64 y.o. who identifies as a female who was assigned female at birth.   Social history: Lives with: *** Work history: ***   Comes in today for follow up of the following chronic medical issues:  1. Essential hypertension ***  2. Coronary artery disease involving native coronary artery of native heart without angina pectoris ***  3. Infrarenal abdominal aortic aneurysm (AAA) without rupture ***  4. Mixed hyperlipidemia ***  5. Prediabetes ***  6. Gastroesophageal reflux disease without esophagitis ***  7. Bipolar 1 disorder (HCC) ***  8. Anxiety ***  9. Crack cocaine use ***  10. Cigarette smoker ***  11. Morbid obesity (HCC) ***   New complaints: ***  Allergies[1] Outpatient Encounter Medications as of 10/04/2024  Medication Sig   albuterol  (VENTOLIN  HFA) 108 (90 Base) MCG/ACT inhaler Inhale 2 puffs into the lungs every 6 (six) hours as needed for wheezing or shortness of breath.   allopurinol (ZYLOPRIM) 100 MG tablet Take 100 mg by mouth 2 (two) times daily.   amLODipine  (NORVASC ) 10 MG tablet Take 1 tablet (10 mg total) by mouth daily.   apixaban  (ELIQUIS ) 5 MG TABS tablet Take 1 tablet (5 mg total) by mouth 2 (two) times daily.   aspirin  EC 81 MG tablet Take 1 tablet (81 mg total) by mouth daily. Swallow whole.   atorvastatin  (LIPITOR ) 80 MG tablet TAKE 1 TABLET BY MOUTH EVERY DAY   Budeson-Glycopyrrol-Formoterol (BREZTRI  AEROSPHERE) 160-9-4.8 MCG/ACT AERO Inhale 2 puffs into the lungs 2 (two) times daily.   busPIRone  (BUSPAR ) 5 MG tablet Take 1 tablet (5 mg total) by mouth 3 (three) times daily as needed.   clonazePAM  (KLONOPIN ) 0.5 MG tablet Take 1 tablet (0.5 mg total) by mouth 2 (two) times daily as needed for anxiety. (Patient not taking: Reported on 06/06/2024)    cloNIDine  (CATAPRES ) 0.2 MG tablet Take 1 tablet (0.2 mg total) by mouth 2 (two) times daily.   cyclobenzaprine  (FLEXERIL ) 10 MG tablet Take 1 tablet (10 mg total) by mouth 2 (two) times daily as needed. (Patient not taking: Reported on 06/06/2024)   furosemide  (LASIX ) 20 MG tablet TAKE 1 TABLET BY MOUTH EVERY DAY   gabapentin  (NEURONTIN ) 300 MG capsule TAKE 1 CAPSULE (300 MG TOTAL) BY MOUTH 3 (THREE) TIMES DAILY AS NEEDED (LEG PAIN). (Patient not taking: Reported on 06/06/2024)   ipratropium-albuterol  (DUONEB) 0.5-2.5 (3) MG/3ML SOLN Inhale 3 mLs into the lungs every 6 (six) hours as needed. (Patient not taking: Reported on 06/06/2024)   lidocaine  (LIDODERM ) 5 % Place 3 patches onto the skin daily. Remove & Discard patch within 12 hours or as directed by MD   metoprolol  tartrate (LOPRESSOR ) 100 MG tablet Take 2 tablets (200 mg total) by mouth 2 (two) times daily.   naloxone (NARCAN) nasal spray 4 mg/0.1 mL PLEASE SEE ATTACHED FOR DETAILED DIRECTIONS   nitroGLYCERIN  (NITROSTAT ) 0.4 MG SL tablet PLACE 1 TABLET UNDER TONGUE AS NEEDED FOR CHEST PAIN EVERY 5 MINUTES X 3 MAX DOSES.  CALL 911 IF PAIN PERSISTS   oxyCODONE -acetaminophen  (PERCOCET/ROXICET) 5-325 MG tablet Take 1 tablet by mouth every 6 (six) hours as needed.   oxyCODONE -acetaminophen  (PERCOCET/ROXICET) 5-325 MG tablet Take 1 tablet by mouth every 6 (six) hours as needed for severe pain.   pantoprazole  (  PROTONIX ) 40 MG tablet Take 1 tablet (40 mg total) by mouth 2 (two) times daily before a meal. (Patient not taking: Reported on 06/06/2024)   sucralfate  (CARAFATE ) 1 GM/10ML suspension Take 10 mLs (1 g total) by mouth 4 (four) times daily for 10 days. (Patient not taking: Reported on 06/06/2024)   XTAMPZA  ER 9 MG C12A Take 1 capsule by mouth 2 (two) times daily.   No facility-administered encounter medications on file as of 10/04/2024.    Past Surgical History:  Procedure Laterality Date   CARDIAC CATHETERIZATION N/A 08/07/2016   Procedure: Left Heart  Cath and Coronary Angiography;  Surgeon: Debby DELENA Sor, MD;  Location: [facility];  Service: Cardiovascular;  Laterality: N/A;   CYST REMOVAL TRUNK Left    beneath L breast   ENDARTERECTOMY Right 02/08/2018   Procedure: ENDARTERECTOMY CAROTID REDO;  Surgeon: Eliza Lonni RAMAN, MD;  Location: [facility] OR;  Service: Vascular;  Laterality: Right;   TUBAL LIGATION     VASCULAR SURGERY Right    right carotid endarterectomy 07/22/05     Family History  Problem Relation Age of Onset   Heart attack Mother    Stroke Mother    Hypertension Mother    Diabetes Mother    Heart disease Mother    Heart attack Father    Hypertension Father    Heart disease Father    Cancer Maternal Aunt    Cancer Maternal Uncle    COPD Paternal Uncle        2 pat uncles with COPD   Cancer Paternal Uncle        1 pat uncle with cancer   Colon cancer Neg Hx       Controlled substance contract: ***     Review of Systems     Objective:   Physical Exam        Assessment & Plan:       [1]  Allergies Allergen Reactions   Penicillins Hives and Swelling    PATIENT HAS HAD A PCN REACTION WITH IMMEDIATE RASH, FACIAL/TONGUE/THROAT SWELLING, SOB, OR LIGHTHEADEDNESS WITH HYPOTENSION:  #  #  YES  #  # HAS PT DEVELOPED SEVERE RASH INVOLVING MUCUS MEMBRANES or SKIN NECROSIS: #  #  YES  #  # Has patient had a PCN reaction that required hospitalization No Has patient had a PCN reaction occurring within the last 10 years: Non If all of the above answers are NO, then may proceed with Cephalosporin use.    "

## 2024-10-04 NOTE — Telephone Encounter (Signed)
 Called to inform of appt time for 1/7 no answer line just rung no vm

## 2024-10-05 ENCOUNTER — Other Ambulatory Visit (HOSPITAL_COMMUNITY): Payer: Self-pay

## 2024-10-05 NOTE — Telephone Encounter (Signed)
 Pharmacy Patient Advocate Encounter  Received notification from [facility] Medicare that Prior Authorization for Lidocaine  5% patches has been APPROVED from now to 09/27/25 Filled yesterday  PA #/Case ID/Reference #: [id number]

## 2024-10-12 ENCOUNTER — Ambulatory Visit: Attending: Nurse Practitioner

## 2024-10-12 DIAGNOSIS — R2689 Other abnormalities of gait and mobility: Secondary | ICD-10-CM | POA: Diagnosis present

## 2024-10-12 DIAGNOSIS — M6281 Muscle weakness (generalized): Secondary | ICD-10-CM | POA: Diagnosis present

## 2024-10-12 NOTE — Therapy (Signed)
 " OUTPATIENT PHYSICAL THERAPY TREATMENT/PROGRESS NOTE   Patient Name: [name] MRN: [id number] DOB:1961/04/19, 64 y.o., female Today's Date: 10/12/2024  END OF SESSION:  PT End of Session - 10/12/24 1433     Visit Number 8    Number of Visits 12    Date for Recertification  01/04/25    Authorization Type MCR MCD    PT Start Time 1433    PT Stop Time 1511    PT Time Calculation (min) 38 min    Equipment Utilized During Treatment Gait belt    Activity Tolerance Patient tolerated treatment well    Behavior During Therapy [facility] for tasks assessed/performed             Past Medical History:  Diagnosis Date   AAA (abdominal aortic aneurysm) without rupture 08/25/2016   Anxiety    Arthritis    Bipolar 1 disorder (HCC)    CAD (coronary artery disease)    a. 08/08/16 LAD 70%, 60% 1st diag, CTO RCA Med Rx EF 55%   Carotid artery stenosis    Right carotid endarterectomy 07/22/05 with recurrent stenosis 01/2017; known left ICA occlusion   COPD (chronic obstructive pulmonary disease) (HCC)    Crack cocaine use    Depression    Headache    Hypertension    Myocardial infarction [facility])    Right Groin Hematoma    a. 07/2016 following diagnostic cath   Stroke [facility] 2017   Past Surgical History:  Procedure Laterality Date   CARDIAC CATHETERIZATION N/A 08/07/2016   Procedure: Left Heart Cath and Coronary Angiography;  Surgeon: Debby DELENA Sor, MD;  Location: [facility] INVASIVE CV LAB;  Service: Cardiovascular;  Laterality: N/A;   CYST REMOVAL TRUNK Left    beneath L breast   ENDARTERECTOMY Right 02/08/2018   Procedure: ENDARTERECTOMY CAROTID REDO;  Surgeon: Eliza Lonni RAMAN, MD;  Location: [facility] OR;  Service: Vascular;  Laterality: Right;   TUBAL LIGATION     VASCULAR SURGERY Right    right carotid endarterectomy 07/22/05    Patient Active Problem List   Diagnosis Date Noted   Morbid obesity (HCC) 10/04/2024   History of CVA (cerebrovascular accident) 03/24/2024   Acute  ischemic right PCA stroke (HCC) 03/23/2024   Prediabetes 07/30/2023   Hyperlipidemia 07/30/2023   Cigarette smoker 05/25/2023   CAP (community acquired pneumonia) 05/23/2023   Leukocytosis 05/23/2023   Acute respiratory failure with hypoxia and hypercarbia (HCC) 05/23/2023   GERD (gastroesophageal reflux disease) 05/18/2023   History of COPD 05/06/2023   Chronic bronchitis (HCC) 04/28/2023   Dysphagia 04/28/2023   Gastroenteritis 03/02/2023   Spinal stenosis of lumbar region 03/02/2023   PVD (peripheral vascular disease) 12/24/2021   Carotid stenosis 02/08/2018   AAA (abdominal aortic aneurysm) without rupture 08/25/2016   Carotid disease, bilateral 08/18/2016   Cardiomyopathy, ischemic 08/18/2016   CAD (coronary artery disease) 08/08/2016   Bipolar 1 disorder (HCC)    Essential hypertension    Anxiety    Crack cocaine use    Unstable angina pectoris (HCC) 08/06/2016    PCP: Terry Wilhelmena Lloyd Hilario, FNP   REFERRING PROVIDER: Delight Mom   REFERRING DIAG: [phone] (ICD-10-CM) - Cerebral infarction due to embolism of right posterior cerebral artery   Rationale for Evaluation and Treatment:  Rehabiliation  THERAPY DIAG:  Muscle weakness (generalized)  Other abnormalities of gait and mobility  ONSET DATE: 03/21/24 CVA   SUBJECTIVE:  SUBJECTIVE STATEMENT: Pt report she has improved some with her physical abilities. She states that she is inconsistent with HEP. No significant pain reported today only increase in leg cramping with activities.   PERTINENT HISTORY:  History of Present Illness: [name] is a 64 y.o. female with a past medical history of essential hypertension, bilateral carotid artery disease (including left common carotid occlusion and prior right CEA), infrarenal  abdominal aortic aneurysm (now measuring 5 cm), non-insulin -dependent type 2 diabetes, ongoing tobacco abuse, and prior substance abuse (quit approximately 5 years ago), cardiomyopathy with LV recovery, hyperlipidemia, and tobacco abuse who presented to the emergency department at [facility] on 03/24/2024 complaining of a 1 day history of sudden onset of visual field deficit. NIHSS was 1. Head CT at the outside hospital revealed right occipital ischemic changes. CTA head and neck showed a right mid posterior cerebral artery occlusion and LEFT ICA occlusion. She was then transferred to [facility] and admitted to the Neurology service for further management and stroke work up.   PAIN: NPRS scale: 7/10 in left leg Pain description: constant, achy, sharp Aggravating factors: walking Relieving factors: rest,sitting   PRECAUTIONS: ,  None  RED FLAGS: None   WEIGHT BEARING RESTRICTIONS:  No  FALLS:  Has patient fallen in last 6 months? No   OCCUPATION:  None, disability  PLOF:  She can shower herself and use the bathroom herself but mostly Needs assistance with ADLs  PATIENT GOALS:  Walking more.   OBJECTIVE:  Note: Objective measures were completed at Evaluation unless otherwise noted.  DIAGNOSTIC FINDINGS:  MRI IMPRESSION:   1. Acute RIGHT PCA distribution infarct. No hemorrhage.  2. Occlusion of the LEFT ICA.  3. Old LEFT frontal infarct. Additional old lacunar infarcts.  4. Cerebral atrophy with moderate white matter disease reflecting gliosis secondary to chronic microvascular ischemia.   PATIENT SURVEYS:  Patient-Specific Activity Scoring Scheme  0 represents unable to perform. 10 represents able to perform at prior level. 0 1 2 3 4 5 6 7 8 9  10 (Date and Score)   Activity Eval  10/12/24   1. Sit to chair 7   9  2. Walking 5 minutes 2   5  3.     4.    5.    Score 4.5/10 7   Total score = sum of the activity  scores/number of activities Minimum detectable change (90%CI) for average score = 2 points Minimum detectable change (90%CI) for single activity score = 3 points   GAIT: Distance walked: limited to household distances due to pain and weakness Assistive device utilized: [employer] - 4 wheeled Level of assistance: SBA   Visual changes: reports blurry vision since stroke  Sensation: WNL  Memory: reports issues since stroke   UPPER EXTREMITY ROM: WNL    UPPER EXTREMITY FFU:hmnddob 5/5 MMT bilat     LOWER EXTREMITY MMT:    MMT Right eval Left eval Left 05/16/24 Right/Left 08/16/24 R/L  Hip flexion 4 3 4  4+/4+ 5/4+  Hip extension       Hip abduction       Hip adduction       Hip internal rotation       Hip external rotation       Knee flexion 5 4  4+/4+ 5/4+  Knee extension 5 4  4+/4+ 5/4+  Ankle dorsiflexion       Ankle plantarflexion       Ankle inversion  Ankle eversion        (Blank rows = not tested)   FUNCTIONAL TESTS:  Eval: 30 second chair stand test: 6 reps 2 minute walk test: 111 feet, one seated rest break needed and unable to continue until 2 min was up. Used RW and CGA needed  08/16/24 30 second chair stand test: 7 reps 2 minute walk test: 130 feet, one seated rest break needed and unable to continue until 2 min was up. Used RW and CGA needed  10/12/24 30 second chair stand test: 11 reps 2 minute walk test: 213 feet, without rest break needed and unable to continue until 2 min was up. Used RW and CGA needed                                                                                                                                TREATMENT DATE:  10/12/24 Therex: Nu step L5 X 10 min UE/LE for endurance  HEP review   07/12/24 Therex: Nu step L5 X 15 min for endurance and leg strength HEP review  Theractivity: Wheelchair to chair transfer, stand pivot transfer to and from, mod I now Sit to stand from wheelchair with UE support X 4  reps during session, supervision Ambulation with RW and supervison 130 feet Sit to stand X 7 reps in 30 sec  PATIENT EDUCATION: Education details: HEP, PT plan of care, selfcare Person educated: Patient Education method: Explanation, Demonstration, Verbal cues, and Handouts Education comprehension: verbalized understanding, further education recommended   HOME EXERCISE PROGRAM: Access Code: [email or web address] URL: [email or web address] Date: 04/11/2024 Prepared by: Redell Moose  Exercises - Seated Straight Leg Raise with Quad Contraction  - 2 x daily - 6 x weekly - 3 sets - 5 reps - Seated Knee Extension with Resistance  - 2 x daily - 6 x weekly - 3 sets - 10 reps - Single Leg Sit to Stand with Arm Support  - 2 x daily - 6 x weekly - 2 sets - 5 reps - alternating marching  - 2 x daily - 6 x weekly - 1 sets - 10 reps - Seated Gaze Stabilization with Head Rotation  - 2 x daily - 6 x weekly - 1 sets - 2 reps - 30 sec hold - Seated Gaze Stabilization with Head Nod and Vertical Arm Movement  - 2 x daily - 6 x weekly - 1 sets - 2 reps - 30 sec hold - Standing Romberg to 1/2 Tandem Stance  - 2 x daily - 6 x weekly - 1 sets - 3 reps - 15 sec hold  ASSESSMENT:  CLINICAL IMPRESSION: She has not attended PT in approx 1 month but did demonstrate significant improvement in functional tests today including 2 minute walk test and 30 sec sit to stand performance. Pt would benefit from continued physical therapy. PT updated goals this session.   OBJECTIVE IMPAIRMENTS: decreased activity tolerance for ADL's, difficulty walking, decreased  balance, decreased endurance, decreased mobility, decreased ROM, decreased strength, impaired flexibility, and pain.  ACTIVITY LIMITATIONS: bending, lifting, carry, locomotion, standing, walking, vision   PERSONAL FACTORS: see above PMH  also affecting patient's functional outcome.  REHAB POTENTIAL: Fair , limited by lack of motivation to attend PT  CLINICAL  DECISION MAKING: Evolving/moderate complexity  EVALUATION COMPLEXITY: Moderate    GOALS: Short term PT Goals Target date: 05/09/2024   Pt will be I and compliant with HEP. Baseline:  Goal status: ongoing 08/16/24, has not been compliant with them as prescribed but plans to improve this  Long term PT goals Target date:09/06/24 Pt will improve leg strength to at least 4+/5 MMT to improve functional strength Baseline: Goal status: MET 08/16/24 Pt will improve Patient specific functional scale (PSFS) to at least 6/10 to show improved function level Baseline: Goal status: MET 10/12/24 Pt will improve 30 second chair stand test to 9 or more reps Baseline:6 Goal status: MET 11 Pt will be able to ambulate at least 150 feet on 2 minute walk test. Baseline:111 Goal status: MET 10/12/24  Updated Goals 10/12/24, Target date: 01/04/2025    5. Pt will improve 30 second chair stand test to 15 or more reps Baseline:11 Goal status: NEW 6. Pt will be able to ambulate at least 250 feet on 2 minute walk test. Baseline: 213  Goal status: NEW  PLAN: PT FREQUENCY: 1-3 times per week   PT DURATION: 6 weeks  PLANNED INTERVENTIONS (unless contraindicated):  97110-Therapeutic exercises, 97530- Therapeutic activity, 97112- Neuromuscular re-education, 97535- Self Care, [id number]- Manual therapy, [phone]- Gait training, Balance training, Vestibular training, and Visual/preceptual remediation/compensation  PLAN FOR NEXT SESSION:  work on leg strength, gait, balance, endurance   Massie Ada, PT,DPT 10/12/2024, 3:25 PM  "

## 2024-10-16 NOTE — Addendum Note (Signed)
 Addended by: MARANDA REDELL SAUNDERS on: 10/16/2024 08:32 AM   Modules accepted: Orders

## 2024-10-16 NOTE — Addendum Note (Signed)
 Addended by: Russell Quinney on: 10/16/2024 08:50 AM   Modules accepted: Orders

## 2024-10-19 ENCOUNTER — Ambulatory Visit

## 2024-10-26 ENCOUNTER — Ambulatory Visit: Admitting: Physical Therapy

## 2024-10-28 ENCOUNTER — Other Ambulatory Visit: Payer: Self-pay | Admitting: Family Medicine

## 2024-11-08 ENCOUNTER — Ambulatory Visit: Admitting: Cardiology
